# Patient Record
Sex: Male | Born: 1975 | Race: White | Hispanic: No | Smoking: Current every day smoker
Health system: Southern US, Community
[De-identification: ages and names within clinical notes are randomized; demographics above are authoritative.]

## PROBLEM LIST (undated history)

## (undated) DIAGNOSIS — I10 Essential (primary) hypertension: Secondary | ICD-10-CM

## (undated) DIAGNOSIS — F419 Anxiety disorder, unspecified: Secondary | ICD-10-CM

## (undated) DIAGNOSIS — F329 Major depressive disorder, single episode, unspecified: Secondary | ICD-10-CM

## (undated) DIAGNOSIS — F429 Obsessive-compulsive disorder, unspecified: Secondary | ICD-10-CM

## (undated) DIAGNOSIS — F32A Depression, unspecified: Secondary | ICD-10-CM

## (undated) DIAGNOSIS — F1020 Alcohol dependence, uncomplicated: Secondary | ICD-10-CM

## (undated) DIAGNOSIS — G4733 Obstructive sleep apnea (adult) (pediatric): Secondary | ICD-10-CM

## (undated) DIAGNOSIS — E7800 Pure hypercholesterolemia, unspecified: Secondary | ICD-10-CM

## (undated) DIAGNOSIS — K529 Noninfective gastroenteritis and colitis, unspecified: Secondary | ICD-10-CM

## (undated) DIAGNOSIS — F4310 Post-traumatic stress disorder, unspecified: Secondary | ICD-10-CM

## (undated) DIAGNOSIS — I4892 Unspecified atrial flutter: Secondary | ICD-10-CM

## (undated) DIAGNOSIS — I429 Cardiomyopathy, unspecified: Secondary | ICD-10-CM

## (undated) HISTORY — DX: Obstructive sleep apnea (adult) (pediatric): G47.33

## (undated) HISTORY — DX: Unspecified atrial flutter: I48.92

## (undated) HISTORY — DX: Pure hypercholesterolemia, unspecified: E78.00

---

## 2002-10-27 ENCOUNTER — Ambulatory Visit (HOSPITAL_COMMUNITY): Admission: RE | Admit: 2002-10-27 | Discharge: 2002-10-27 | Payer: Self-pay | Admitting: Internal Medicine

## 2002-10-27 ENCOUNTER — Encounter: Payer: Self-pay | Admitting: Internal Medicine

## 2002-10-29 ENCOUNTER — Emergency Department (HOSPITAL_COMMUNITY): Admission: EM | Admit: 2002-10-29 | Discharge: 2002-10-29 | Payer: Self-pay | Admitting: Emergency Medicine

## 2008-03-24 ENCOUNTER — Emergency Department (HOSPITAL_COMMUNITY): Admission: EM | Admit: 2008-03-24 | Discharge: 2008-03-24 | Payer: Self-pay | Admitting: Emergency Medicine

## 2008-03-24 ENCOUNTER — Inpatient Hospital Stay (HOSPITAL_COMMUNITY): Admission: AD | Admit: 2008-03-24 | Discharge: 2008-04-07 | Payer: Self-pay | Admitting: Psychiatry

## 2008-03-24 ENCOUNTER — Ambulatory Visit: Payer: Self-pay | Admitting: Psychiatry

## 2008-04-02 ENCOUNTER — Ambulatory Visit: Payer: Self-pay | Admitting: Psychiatry

## 2008-04-08 ENCOUNTER — Other Ambulatory Visit (HOSPITAL_COMMUNITY): Admission: RE | Admit: 2008-04-08 | Discharge: 2008-04-16 | Payer: Self-pay | Admitting: Psychiatry

## 2008-07-14 ENCOUNTER — Inpatient Hospital Stay (HOSPITAL_COMMUNITY): Admission: RE | Admit: 2008-07-14 | Discharge: 2008-07-27 | Payer: Self-pay | Admitting: Psychiatry

## 2008-07-14 ENCOUNTER — Ambulatory Visit: Payer: Self-pay | Admitting: Psychiatry

## 2008-07-14 ENCOUNTER — Emergency Department (HOSPITAL_COMMUNITY): Admission: EM | Admit: 2008-07-14 | Discharge: 2008-07-14 | Payer: Self-pay | Admitting: Emergency Medicine

## 2008-07-20 ENCOUNTER — Ambulatory Visit (HOSPITAL_COMMUNITY): Admission: RE | Admit: 2008-07-20 | Discharge: 2008-07-20 | Payer: Self-pay | Admitting: Psychiatry

## 2008-10-14 ENCOUNTER — Emergency Department (HOSPITAL_BASED_OUTPATIENT_CLINIC_OR_DEPARTMENT_OTHER): Admission: EM | Admit: 2008-10-14 | Discharge: 2008-10-14 | Payer: Self-pay | Admitting: Emergency Medicine

## 2008-10-15 ENCOUNTER — Inpatient Hospital Stay (HOSPITAL_COMMUNITY): Admission: AD | Admit: 2008-10-15 | Discharge: 2008-10-23 | Payer: Self-pay | Admitting: Psychiatry

## 2008-10-17 ENCOUNTER — Ambulatory Visit: Payer: Self-pay | Admitting: Psychiatry

## 2008-11-12 ENCOUNTER — Emergency Department (HOSPITAL_COMMUNITY): Admission: EM | Admit: 2008-11-12 | Discharge: 2008-11-13 | Payer: Self-pay | Admitting: Emergency Medicine

## 2008-11-13 ENCOUNTER — Inpatient Hospital Stay (HOSPITAL_COMMUNITY): Admission: RE | Admit: 2008-11-13 | Discharge: 2008-11-15 | Payer: Self-pay | Admitting: Psychiatry

## 2009-10-07 ENCOUNTER — Emergency Department (HOSPITAL_COMMUNITY): Admission: EM | Admit: 2009-10-07 | Discharge: 2009-10-07 | Payer: Self-pay | Admitting: Emergency Medicine

## 2010-05-04 LAB — DIFFERENTIAL
Eosinophils Absolute: 0.1 10*3/uL (ref 0.0–0.7)
Eosinophils Relative: 1 % (ref 0–5)
Lymphs Abs: 2 10*3/uL (ref 0.7–4.0)
Monocytes Relative: 6 % (ref 3–12)

## 2010-05-04 LAB — POCT I-STAT, CHEM 8
Calcium, Ion: 1.04 mmol/L — ABNORMAL LOW (ref 1.12–1.32)
Creatinine, Ser: 0.9 mg/dL (ref 0.4–1.5)
Glucose, Bld: 132 mg/dL — ABNORMAL HIGH (ref 70–99)
Hemoglobin: 17.3 g/dL — ABNORMAL HIGH (ref 13.0–17.0)
TCO2: 22 mmol/L (ref 0–100)

## 2010-05-04 LAB — CBC
HCT: 47.7 % (ref 39.0–52.0)
Platelets: 444 10*3/uL — ABNORMAL HIGH (ref 150–400)
RDW: 13.9 % (ref 11.5–15.5)

## 2010-05-04 LAB — ETHANOL: Alcohol, Ethyl (B): 268 mg/dL — ABNORMAL HIGH (ref 0–10)

## 2010-05-04 LAB — RAPID URINE DRUG SCREEN, HOSP PERFORMED
Barbiturates: NOT DETECTED
Opiates: NOT DETECTED

## 2010-05-05 LAB — DIFFERENTIAL
Eosinophils Relative: 4 % (ref 0–5)
Lymphocytes Relative: 39 % (ref 12–46)
Lymphs Abs: 3.4 10*3/uL (ref 0.7–4.0)

## 2010-05-05 LAB — CBC
MCHC: 34 g/dL (ref 30.0–36.0)
MCV: 87 fL (ref 78.0–100.0)
Platelets: 332 10*3/uL (ref 150–400)
RDW: 13.3 % (ref 11.5–15.5)

## 2010-05-05 LAB — COMPREHENSIVE METABOLIC PANEL
AST: 47 U/L — ABNORMAL HIGH (ref 0–37)
CO2: 25 mEq/L (ref 19–32)
Calcium: 9.6 mg/dL (ref 8.4–10.5)
Creatinine, Ser: 0.9 mg/dL (ref 0.4–1.5)
GFR calc Af Amer: >60 mL/min (ref >60)
GFR calc non Af Amer: >60 mL/min (ref >60)

## 2010-05-05 LAB — URINE MICROSCOPIC-ADD ON

## 2010-05-05 LAB — URINALYSIS, ROUTINE W REFLEX MICROSCOPIC
Glucose, UA: NEGATIVE mg/dL
Ketones, ur: NEGATIVE mg/dL
Leukocytes, UA: NEGATIVE
pH: 6 (ref 5.0–8.0)

## 2010-05-05 LAB — ETHANOL: Alcohol, Ethyl (B): 155 mg/dL — ABNORMAL HIGH (ref 0–10)

## 2010-05-05 LAB — POCT TOXICOLOGY PANEL

## 2010-05-08 LAB — DIFFERENTIAL
Basophils Absolute: 0 10*3/uL (ref 0.0–0.1)
Lymphocytes Relative: 28 % (ref 12–46)
Monocytes Absolute: 0.4 10*3/uL (ref 0.1–1.0)
Monocytes Relative: 4 % (ref 3–12)
Neutro Abs: 6.2 10*3/uL (ref 1.7–7.7)
Neutrophils Relative %: 66 % (ref 43–77)

## 2010-05-08 LAB — ETHANOL: Alcohol, Ethyl (B): 202 mg/dL — ABNORMAL HIGH (ref 0–10)

## 2010-05-08 LAB — BASIC METABOLIC PANEL
Calcium: 9.1 mg/dL (ref 8.4–10.5)
GFR calc Af Amer: >60 mL/min (ref >60)
GFR calc non Af Amer: >60 mL/min (ref >60)
Sodium: 138 mEq/L (ref 135–145)

## 2010-05-08 LAB — RAPID URINE DRUG SCREEN, HOSP PERFORMED
Benzodiazepines: NOT DETECTED
Cocaine: NOT DETECTED
Opiates: NOT DETECTED
Tetrahydrocannabinol: NOT DETECTED

## 2010-05-08 LAB — CBC
Hemoglobin: 17.2 g/dL — ABNORMAL HIGH (ref 13.0–17.0)
RBC: 5.64 MIL/uL (ref 4.22–5.81)

## 2010-05-08 LAB — TRICYCLICS SCREEN, URINE: TCA Scrn: 62.6 — AB

## 2010-05-16 LAB — DIFFERENTIAL
Basophils Absolute: 0.1 10*3/uL (ref 0.0–0.1)
Basophils Relative: 1 % (ref 0–1)
Eosinophils Relative: 0 % (ref 0–5)
Monocytes Absolute: 0.4 10*3/uL (ref 0.1–1.0)
Monocytes Relative: 3 % (ref 3–12)

## 2010-05-16 LAB — RAPID URINE DRUG SCREEN, HOSP PERFORMED
Amphetamines: NOT DETECTED
Barbiturates: NOT DETECTED
Opiates: NOT DETECTED

## 2010-05-16 LAB — HEPATIC FUNCTION PANEL
AST: 40 U/L — ABNORMAL HIGH (ref 0–37)
Bilirubin, Direct: 0.2 mg/dL (ref 0.0–0.3)
Indirect Bilirubin: 0.8 mg/dL (ref 0.3–0.9)
Total Bilirubin: 1 mg/dL (ref 0.3–1.2)

## 2010-05-16 LAB — BASIC METABOLIC PANEL
CO2: 25 mEq/L (ref 19–32)
Chloride: 103 mEq/L (ref 96–112)
GFR calc Af Amer: >60 mL/min (ref >60)
Glucose, Bld: 104 mg/dL — ABNORMAL HIGH (ref 70–99)
Potassium: 3.9 mEq/L (ref 3.5–5.1)
Sodium: 142 mEq/L (ref 135–145)

## 2010-05-16 LAB — URINALYSIS, ROUTINE W REFLEX MICROSCOPIC
Bilirubin Urine: NEGATIVE
Hgb urine dipstick: NEGATIVE
Ketones, ur: NEGATIVE mg/dL
Nitrite: NEGATIVE
pH: 5.5 (ref 5.0–8.0)

## 2010-05-16 LAB — URINE MICROSCOPIC-ADD ON

## 2010-05-16 LAB — CBC
HCT: 48.4 % (ref 39.0–52.0)
Hemoglobin: 16.7 g/dL (ref 13.0–17.0)
MCHC: 34.6 g/dL (ref 30.0–36.0)
MCV: 92.2 fL (ref 78.0–100.0)
RBC: 5.25 MIL/uL (ref 4.22–5.81)
RDW: 13.1 % (ref 11.5–15.5)

## 2010-06-13 NOTE — H&P (Signed)
NAME:  KALETH, KOY NO.:  [id number]   MEDICAL RECORD NO.:  [id number]          PATIENT TYPE:  IPS   LOCATION:  0503                          FACILITY:  [facility]   PHYSICIAN:  Geoffery Lyons, M.D.      DATE OF BIRTH:  [birth year]   DATE OF ADMISSION:  03/24/2008  DATE OF DISCHARGE:                       PSYCHIATRIC ADMISSION ASSESSMENT   This is a 35 year old male voluntarily admitted on March 24, 2008.   HISTORY OF PRESENT ILLNESS:  The patient is here to be detoxed off  alcohol.  He states it is killing him.  He has been drinking a lot.  He states it is ruining his  relationship, his occupation and having  some physical problems.  His longest history of sobriety from alcohol  has been 1 week where he states it was rough.  He had trouble sleeping  at that time.  His last drink was the day prior to this admission at 11  a.m.   PAST PSYCHIATRIC HISTORY:  First admission to the [facility].  The patient's psychiatrist is Dr. [name].   SOCIAL HISTORY:  This is a 35 year old male who is single.  He is at  [employer]  No legal issues.   FAMILY HISTORY:  Substance use with the father and grandfather pain.   ALCOHOL AND DRUG HISTORY:  Again, the patient reports drinking to  excess.  Denies any seizures or blackouts.  Denies any recreational drug  use.   PRIMARY CARE PHYSICIAN:  Dr. Merla Riches.   MEDICAL PROBLEMS:  No known acute or chronic medical conditions.   MEDICATIONS:  Recently prescribed Wellbutrin.   DRUG ALLERGIES:  No known allergies.   PHYSICAL EXAMINATION:  This is a young male.  He was fully assessed in  the emergency department.  His physical exam was reviewed with no  significant findings.  He did receive Pepcid.  His temperature is 98.3,  108 heart rate, 20 respirations, blood pressure is 139/96, 213 pounds, 6  feet tall.   LABORATORY DATA:  BMET within normal limits.  Alcohol level was 177.  Urine drug screen was negative.  WBC  count was 12.   MENTAL STATUS EXAMINATION:  He is fully alert, walking in the hallway,  easily approachable and cooperative for interview, casually dressed,  good eye contact.  Speech is clear, normal pace and tone.  The patient's  mood is depressed.  His affect, again, he appears sad, almost became  tearful when talking about losing his relationship because of his  alcohol use.  Thought processes are coherent and goal directed.  No  delusional statements.  His memory appears intact.  Judgment and insight  appears to be good.  He appears sincere.   IMPRESSION:  AXIS I:  Alcohol dependence, depressive disorder NOS.  AXIS II:  Deferred.  AXIS III:  No known medical conditions.  AXIS IV:  Possible problems with primary support group and occupation.  AXIS V:  Current is 40-45.   PLAN:  Out plan is to contract for safety.  We will put patient on the  Librium protocol which was discussed  with the patient.  Will monitor  withdrawal symptoms.  Work on relapse prevention.  The patient will be  in the red group at this time.  Will continue to assess comorbidities in  his support group.  A case manager will assess rehab programs available  to the patient.  His tentative length of stay at this time is 3-4 days.      Landry Corporal, N.P.      Geoffery Lyons, M.D.  Electronically Signed    JO/MEDQ  D:  03/24/2008  T:  03/25/2008  Job:  [id number]

## 2010-06-13 NOTE — H&P (Signed)
NAME:  Malik Edwards, Malik Edwards NO.:  [id number]   MEDICAL RECORD NO.:  [id number]          PATIENT TYPE:  IPS   LOCATION:  0306                          FACILITY:  [facility]   PHYSICIAN:  Geoffery Lyons, M.D.      DATE OF BIRTH:  1975-08-11   DATE OF ADMISSION:  07/14/2008  DATE OF DISCHARGE:                       PSYCHIATRIC ADMISSION ASSESSMENT   HISTORY OF PRESENT ILLNESS:  The patient states that he quit taking his  medication and was remaining sober for approximately 6 months when he  relapsed this past Sunday.  He reports that he was experiencing  flashbacks again and experiencing auditory hallucinations.  States that  he began drinking to counteract the hallucinations and hoping to forget.  He came in to get some help before anything further had happened.   PAST PSYCHIATRIC HISTORY:  The patient has been here prior.  He sees Dr.  Lafayette Dragon with his last visit on April 2010 and has a therapist, [name].   SOCIAL HISTORY:  The patient states he is in a current relationship.  He  states this is a good relationship for him.  His girlfriend is  supportive.  The patient is employed.   FAMILY HISTORY:  None.   INTERIM HISTORY:  Has had some recent drinking, but was sober from  alcohol for 6 months.  His primary care Tyera Hansley is the Dr. Yong Channel at  [facility]   MEDICAL PROBLEMS:  The patient denies any acute or chronic health  issues.   MEDICATIONS:  Has been on most recently on __________  but states that  the medication was never working.  Felt better on medications with his  last admission.  Was unable to recall what those medications are.   DRUG ALLERGIES:  No known allergies.   PHYSICAL EXAMINATION:  GENERAL:  This is a young male who was assessed  at [facility] emergency department.  Physical exam was reviewed with no  significant findings.  He offers no complaints.  Again appears in no  acute distress.   LABORATORY DATA:  Urine drug screen is  negative.  Alcohol level of 202.  BMET is within normal limits.   MENTAL STATUS EXAM:  He is fully alert and cooperative with good eye  contact, casually dressed.  Speech is clear, normal pace and tone.  The  patient's mood is depressed.  He gets teary-eyed at times.  He has a  sense of humor.  Thought processes are coherent, goal directed and does  not appear to be responding to internal stimuli at this time.  Cognitive  function intact.  His memory is good.  Judgment and insight fair.   AXIS I:  Post traumatic stress disorder.  Alcohol abuse.  AXIS II:  Deferred.  AXIS III:  No known medical conditions.  AXIS IV:  Other psychosocial problems.  AXIS V:  Current is 35-40.   PLAN:  Have Librium available on a p.r.n. basis.  Will obtain old  records and review medications.  Will contact girlfriend for a possible  family session.  The patient is to continue with his therapy  and case  manager will assess his follow-up.  His tentative length of stay at this  time is 2-4 days.      Landry Corporal, N.P.      Geoffery Lyons, M.D.  Electronically Signed    JO/MEDQ  D:  07/19/2008  T:  07/19/2008  Job:  [id number]
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
## 2011-01-30 DIAGNOSIS — I4892 Unspecified atrial flutter: Secondary | ICD-10-CM

## 2011-01-30 HISTORY — DX: Unspecified atrial flutter: I48.92

## 2011-10-25 ENCOUNTER — Ambulatory Visit (INDEPENDENT_AMBULATORY_CARE_PROVIDER_SITE_OTHER): Payer: BC Managed Care – PPO | Admitting: Emergency Medicine

## 2011-10-25 VITALS — BP 148/98 | HR 107 | Temp 98.0°F | Resp 18 | Ht 70.0 in | Wt 321.0 lb

## 2011-10-25 DIAGNOSIS — A088 Other specified intestinal infections: Secondary | ICD-10-CM

## 2011-10-25 DIAGNOSIS — J40 Bronchitis, not specified as acute or chronic: Secondary | ICD-10-CM

## 2011-10-25 MED ORDER — LOPERAMIDE HCL 2 MG PO TABS: 2.0000 mg | ORAL_TABLET | Freq: Four times a day (QID) | ORAL | Status: DC | PRN

## 2011-10-25 MED ORDER — AMOXICILLIN-POT CLAVULANATE 875-125 MG PO TABS: 1.0000 | ORAL_TABLET | Freq: Two times a day (BID) | ORAL | Status: DC

## 2011-10-25 MED ORDER — HYDROCOD POLST-CHLORPHEN POLST 10-8 MG/5ML PO LQCR: 5.0000 mL | Freq: Two times a day (BID) | ORAL | Status: DC | PRN

## 2011-10-25 NOTE — Progress Notes (Signed)
[facility] [address] [place] [address] [phone]  Date:  10/25/2011   Name:  Malik Edwards   DOB:  27-Apr-1975   MRN:  [id number]  PCP:  No primary provider on file.    Chief Complaint: URI, Emesis and Diarrhea   History of Present Illness:  Malik Edwards is a 36 y.o. very pleasant male patient who presents with the following:  Ill since three days ago and left work early.  Began with a cough that was productive of a green sputum. Felt like he had a fever.  Tuesday had episode of sever coughing followed by an episode of vomiting.  3x on Tuesday.  Vomiting persisted but has not vomited today.  Tuesday developed diarrhea that is watery.  4-5 loose stools yesterday. No recent antibiotics, no travel or questionable water.  No sick contacts.  Still feels general malaise and myalgias.  Smokes 1/2 PPD.  No wheezing or shortness of breath.  There is no problem list on file for this patient.   No past medical history on file.  No past surgical history on file.  History  Substance Use Topics  . Smoking status: Current Every Day Smoker -- 0.5 packs/day for 15 years    Types: Cigarettes  . Smokeless tobacco: Not on file  . Alcohol Use: Not on file    No family history on file.  No Known Allergies  Medication list has been reviewed and updated.  Current Outpatient Prescriptions on File Prior to Visit  Medication Sig Dispense Refill  . citalopram (CELEXA) 20 MG tablet Take 20 mg by mouth daily.        Review of Systems:  As per HPI, otherwise negative.    Physical Examination: Filed Vitals:   10/25/11 1320  BP: 148/98  Pulse: 107  Temp: 98 F (36.7 C)  Resp: 18   Filed Vitals:   10/25/11 1320  Height: 5\' 10"  (1.778 m)  Weight: 321 lb (145.605 kg)   Body mass index is 46.06 kg/(m^2). Ideal Body Weight: Weight in (lb) to have BMI = 25: 173.9   GEN: WDWN, NAD, Non-toxic, A & O x 3.  Obese.  No rash or sign of sepsis.  Not icteric.   Well hydrated HEENT: Atraumatic, Normocephalic. Neck supple. No masses, No LAD.  Oropharynx negative.   Ears and Nose: No external deformity.  TM negative CV: RRR, No M/G/R. No JVD. No thrill. No extra heart sounds. PULM: CTA B, no wheezes, crackles, rhonchi. No retractions. No resp. distress. No accessory muscle use. ABD: S, NT, ND, +BS. No rebound. No HSM. EXTR: No c/c/e NEURO Normal gait.  PSYCH: Normally interactive. Conversant. Not depressed or anxious appearing.  Calm demeanor. Marland Kitchenl  Assessment and Plan: 1. Bronchitis, not specified as acute or chronic   2. Intestinal infection due to other organism, not elsewhere classified    augmentin tussionex Imodium Follow up as needed   Carmelina Dane, MD  I have reviewed and agree with documentation. Robert P. Merla Riches, M.D.

## 2012-09-12 ENCOUNTER — Inpatient Hospital Stay (HOSPITAL_BASED_OUTPATIENT_CLINIC_OR_DEPARTMENT_OTHER)
Admission: EM | Admit: 2012-09-12 | Discharge: 2012-09-15 | DRG: 138 | Disposition: A | Discharge status: Home / Self Care | Payer: BC Managed Care – PPO | Attending: Cardiovascular Disease | Admitting: Cardiovascular Disease

## 2012-09-12 ENCOUNTER — Encounter (HOSPITAL_BASED_OUTPATIENT_CLINIC_OR_DEPARTMENT_OTHER): Payer: Self-pay

## 2012-09-12 ENCOUNTER — Emergency Department (HOSPITAL_BASED_OUTPATIENT_CLINIC_OR_DEPARTMENT_OTHER): Payer: BC Managed Care – PPO

## 2012-09-12 DIAGNOSIS — Z716 Tobacco abuse counseling: Secondary | ICD-10-CM

## 2012-09-12 DIAGNOSIS — R7401 Elevation of levels of liver transaminase levels: Secondary | ICD-10-CM | POA: Diagnosis present

## 2012-09-12 DIAGNOSIS — Z85828 Personal history of other malignant neoplasm of skin: Secondary | ICD-10-CM

## 2012-09-12 DIAGNOSIS — F259 Schizoaffective disorder, unspecified: Secondary | ICD-10-CM | POA: Diagnosis present

## 2012-09-12 DIAGNOSIS — K5289 Other specified noninfective gastroenteritis and colitis: Secondary | ICD-10-CM | POA: Diagnosis present

## 2012-09-12 DIAGNOSIS — I440 Atrioventricular block, first degree: Secondary | ICD-10-CM | POA: Diagnosis present

## 2012-09-12 DIAGNOSIS — I4892 Unspecified atrial flutter: Principal | ICD-10-CM | POA: Diagnosis present

## 2012-09-12 DIAGNOSIS — G4733 Obstructive sleep apnea (adult) (pediatric): Secondary | ICD-10-CM | POA: Diagnosis present

## 2012-09-12 DIAGNOSIS — I4891 Unspecified atrial fibrillation: Secondary | ICD-10-CM | POA: Diagnosis present

## 2012-09-12 DIAGNOSIS — Z6841 Body Mass Index (BMI) 40.0 and over, adult: Secondary | ICD-10-CM

## 2012-09-12 DIAGNOSIS — F17200 Nicotine dependence, unspecified, uncomplicated: Secondary | ICD-10-CM | POA: Diagnosis present

## 2012-09-12 DIAGNOSIS — I119 Hypertensive heart disease without heart failure: Secondary | ICD-10-CM | POA: Diagnosis present

## 2012-09-12 DIAGNOSIS — I429 Cardiomyopathy, unspecified: Secondary | ICD-10-CM | POA: Diagnosis present

## 2012-09-12 DIAGNOSIS — K7689 Other specified diseases of liver: Secondary | ICD-10-CM | POA: Diagnosis present

## 2012-09-12 DIAGNOSIS — I43 Cardiomyopathy in diseases classified elsewhere: Secondary | ICD-10-CM | POA: Diagnosis present

## 2012-09-12 DIAGNOSIS — I10 Essential (primary) hypertension: Secondary | ICD-10-CM | POA: Diagnosis present

## 2012-09-12 HISTORY — DX: Cardiomyopathy, unspecified: I42.9

## 2012-09-12 HISTORY — DX: Major depressive disorder, single episode, unspecified: F32.9

## 2012-09-12 HISTORY — DX: Noninfective gastroenteritis and colitis, unspecified: K52.9

## 2012-09-12 HISTORY — DX: Essential (primary) hypertension: I10

## 2012-09-12 HISTORY — DX: Depression, unspecified: F32.A

## 2012-09-12 LAB — COMPREHENSIVE METABOLIC PANEL
AST: 57 U/L — ABNORMAL HIGH (ref 0–37)
Albumin: 3.8 g/dL (ref 3.5–5.2)
Alkaline Phosphatase: 111 U/L (ref 39–117)
BUN: 16 mg/dL (ref 6–23)
CO2: 23 mEq/L (ref 19–32)
Chloride: 103 mEq/L (ref 96–112)
GFR calc non Af Amer: >90 mL/min (ref >90)
Potassium: 4.7 mEq/L (ref 3.5–5.1)
Total Bilirubin: 0.2 mg/dL — ABNORMAL LOW (ref 0.3–1.2)

## 2012-09-12 LAB — CBC WITH DIFFERENTIAL/PLATELET
Basophils Absolute: 0.1 10*3/uL (ref 0.0–0.1)
Basophils Relative: 1 % (ref 0–1)
HCT: 43.8 % (ref 39.0–52.0)
Hemoglobin: 14.9 g/dL (ref 13.0–17.0)
Lymphocytes Relative: 18 % (ref 12–46)
MCHC: 34 g/dL (ref 30.0–36.0)
Monocytes Relative: 13 % — ABNORMAL HIGH (ref 3–12)
Neutro Abs: 8.7 10*3/uL — ABNORMAL HIGH (ref 1.7–7.7)
Neutrophils Relative %: 66 % (ref 43–77)
WBC: 13.1 10*3/uL — ABNORMAL HIGH (ref 4.0–10.5)

## 2012-09-12 LAB — PROTIME-INR
INR: 1.09 (ref 0.00–1.49)
Prothrombin Time: 13.9 seconds (ref 11.6–15.2)

## 2012-09-12 MED ORDER — DILTIAZEM HCL 25 MG/5ML IV SOLN
INTRAVENOUS | Status: AC
  Filled 2012-09-12: qty 5

## 2012-09-12 MED ORDER — LISINOPRIL 10 MG PO TABS
10.0000 mg | ORAL_TABLET | Freq: Every day | ORAL | Status: DC
  Administered 2012-09-13 – 2012-09-15 (×3): 10 mg via ORAL
  Filled 2012-09-12 (×3): qty 1

## 2012-09-12 MED ORDER — POTASSIUM CHLORIDE CRYS ER 20 MEQ PO TBCR: 20.0000 meq | EXTENDED_RELEASE_TABLET | Freq: Every day | ORAL | Status: DC

## 2012-09-12 MED ORDER — HEPARIN (PORCINE) IN NACL 100-0.45 UNIT/ML-% IJ SOLN
1750.0000 [IU]/h | INTRAMUSCULAR | Status: AC
  Administered 2012-09-13: 1400 [IU]/h via INTRAVENOUS
  Filled 2012-09-12 (×2): qty 250

## 2012-09-12 MED ORDER — LOPERAMIDE HCL 2 MG PO CAPS
2.0000 mg | ORAL_CAPSULE | Freq: Two times a day (BID) | ORAL | Status: DC
  Filled 2012-09-12: qty 1

## 2012-09-12 MED ORDER — MESALAMINE 1.2 G PO TBEC
1200.0000 mg | DELAYED_RELEASE_TABLET | Freq: Two times a day (BID) | ORAL | Status: DC
  Administered 2012-09-13 – 2012-09-15 (×6): 1.2 g via ORAL
  Filled 2012-09-12 (×7): qty 1

## 2012-09-12 MED ORDER — DILTIAZEM HCL 100 MG IV SOLR
INTRAVENOUS | Status: AC
  Filled 2012-09-12: qty 100

## 2012-09-12 MED ORDER — DEXTROSE 5 % IV SOLN
5.0000 mg/h | INTRAVENOUS | Status: AC
  Administered 2012-09-13 (×2): 15 mg/h via INTRAVENOUS
  Filled 2012-09-12 (×3): qty 100

## 2012-09-12 MED ORDER — CARVEDILOL 25 MG PO TABS
25.0000 mg | ORAL_TABLET | Freq: Two times a day (BID) | ORAL | Status: DC
  Administered 2012-09-13 – 2012-09-15 (×5): 25 mg via ORAL
  Filled 2012-09-12 (×8): qty 1

## 2012-09-12 MED ORDER — FUROSEMIDE 40 MG PO TABS
40.0000 mg | ORAL_TABLET | Freq: Every day | ORAL | Status: DC
  Administered 2012-09-13 – 2012-09-15 (×3): 40 mg via ORAL
  Filled 2012-09-12 (×3): qty 1

## 2012-09-12 MED ORDER — ACETAMINOPHEN 325 MG PO TABS: 650.0000 mg | ORAL_TABLET | ORAL | Status: DC | PRN

## 2012-09-12 MED ORDER — RISPERIDONE 1 MG PO TABS
1.0000 mg | ORAL_TABLET | Freq: Every morning | ORAL | Status: DC
  Administered 2012-09-13 – 2012-09-15 (×3): 1 mg via ORAL
  Filled 2012-09-12 (×3): qty 1

## 2012-09-12 MED ORDER — ONDANSETRON HCL 4 MG/2ML IJ SOLN: 4.0000 mg | Freq: Four times a day (QID) | INTRAMUSCULAR | Status: DC | PRN

## 2012-09-12 MED ORDER — ADENOSINE 6 MG/2ML IV SOLN
INTRAVENOUS | Status: AC
  Administered 2012-09-12: 6 mg
  Filled 2012-09-12: qty 6

## 2012-09-12 MED ORDER — SERTRALINE HCL 50 MG PO TABS
150.0000 mg | ORAL_TABLET | Freq: Every day | ORAL | Status: DC
  Administered 2012-09-13 – 2012-09-14 (×3): 150 mg via ORAL
  Filled 2012-09-12 (×4): qty 1

## 2012-09-12 MED ORDER — DILTIAZEM HCL 100 MG IV SOLR
5.0000 mg/h | INTRAVENOUS | Status: DC
  Administered 2012-09-12: 5 mg/h via INTRAVENOUS

## 2012-09-12 MED ORDER — RISPERIDONE 2 MG PO TABS
2.0000 mg | ORAL_TABLET | Freq: Every day | ORAL | Status: DC
  Administered 2012-09-13 – 2012-09-14 (×3): 2 mg via ORAL
  Filled 2012-09-12 (×4): qty 1

## 2012-09-12 MED ORDER — VITAMIN D3 25 MCG (1000 UNIT) PO TABS
1000.0000 [IU] | ORAL_TABLET | Freq: Every day | ORAL | Status: DC
  Administered 2012-09-13 – 2012-09-15 (×3): 1000 [IU] via ORAL
  Filled 2012-09-12 (×3): qty 1

## 2012-09-12 MED ORDER — HEPARIN BOLUS VIA INFUSION
5000.0000 [IU] | Freq: Once | INTRAVENOUS | Status: AC
  Administered 2012-09-13: 5000 [IU] via INTRAVENOUS
  Filled 2012-09-12: qty 5000

## 2012-09-12 MED ORDER — ADENOSINE 6 MG/2ML IV SOLN
12.0000 mg | Freq: Once | INTRAVENOUS | Status: AC
  Administered 2012-09-12: 12 mg via INTRAVENOUS

## 2012-09-12 NOTE — ED Notes (Signed)
Cardizem 10 mg given at 1645

## 2012-09-12 NOTE — ED Provider Notes (Signed)
CSN: [id number]     Arrival date & time 09/12/12  1559 History     First MD Initiated Contact with Patient 09/12/12 1613     Chief Complaint  Patient presents with  . Palpitations   (Consider location/radiation/quality/duration/timing/severity/associated sxs/prior Treatment) HPI Comments: Patient presents with palpitations. He states that he started having some heart palpitations about 10:30 this morning. This occurred after drinking 2 large tear overlying. He denies any history of SVT or irregular heartbeat in the past. He does have a history of hypertensive cardiomyopathy with an ejection fraction of 35-40%. He sees back in the cardiology and went over there today with the palpitations. They were not comfortable concluding him in the office with a dentist in due his underlying heart disease and sent over here. He denies any chest tightness or shortness of breath. He denies any recent illness. He denies any cough or chest congestion. He denies any leg pain or swelling.  Patient is a 37 y.o. male presenting with palpitations.  Palpitations Associated symptoms: no back pain, no chest pain, no cough, no diaphoresis, no dizziness, no nausea, no numbness, no shortness of breath and no vomiting     Past Medical History  Diagnosis Date  . Hypertension   . Cardiomyopathy   . Colitis    History reviewed. No pertinent past surgical history. No family history on file. History  Substance Use Topics  . Smoking status: Current Every Day Smoker -- 0.50 packs/day for 15 years    Types: Cigarettes  . Smokeless tobacco: Not on file  . Alcohol Use: Not on file    Review of Systems  Constitutional: Negative for fever, chills, diaphoresis and fatigue.  HENT: Negative for congestion, rhinorrhea and sneezing.   Eyes: Negative.   Respiratory: Negative for cough, chest tightness and shortness of breath.   Cardiovascular: Positive for palpitations. Negative for chest pain and leg swelling.   Gastrointestinal: Negative for nausea, vomiting, abdominal pain, diarrhea and blood in stool.  Genitourinary: Negative for frequency, hematuria, flank pain and difficulty urinating.  Musculoskeletal: Negative for back pain and arthralgias.  Skin: Negative for rash.  Neurological: Negative for dizziness, speech difficulty, weakness, numbness and headaches.    Allergies  Review of patient's allergies indicates no known allergies.  Home Medications   Current Outpatient Rx  Name  Route  Sig  Dispense  Refill  . carvedilol (COREG) 25 MG tablet   Oral   Take 25 mg by mouth 2 (two) times daily with a meal.         . furosemide (LASIX) 40 MG tablet   Oral   Take 40 mg by mouth.         Marland Kitchen lisinopril (PRINIVIL,ZESTRIL) 20 MG tablet   Oral   Take 20 mg by mouth daily.         . mesalamine (LIALDA) 1.2 G EC tablet   Oral   Take 1,200 mg by mouth daily with breakfast.         . potassium chloride 20 MEQ/15ML (10%) solution   Oral   Take 40 mEq by mouth daily.         . sertraline (ZOLOFT) 100 MG tablet   Oral   Take 100 mg by mouth daily.         Marland Kitchen amoxicillin-clavulanate (AUGMENTIN) 875-125 MG per tablet   Oral   Take 1 tablet by mouth 2 (two) times daily.   20 tablet   0   . chlorpheniramine-HYDROcodone (TUSSIONEX PENNKINETIC ER)  10-8 MG/5ML LQCR   Oral   Take 5 mLs by mouth every 12 (twelve) hours as needed (cough).   60 mL   0   . citalopram (CELEXA) 20 MG tablet   Oral   Take 20 mg by mouth daily.         Marland Kitchen loperamide (IMODIUM A-D) 2 MG tablet   Oral   Take 1 tablet (2 mg total) by mouth 4 (four) times daily as needed for diarrhea or loose stools.   30 tablet   0    BP 141/96  Pulse 94  Resp 20  SpO2 100% Physical Exam  Constitutional: He is oriented to person, place, and time. He appears well-developed and well-nourished.  HENT:  Head: Normocephalic and atraumatic.  Eyes: Pupils are equal, round, and reactive to light.  Neck: Normal range  of motion. Neck supple.  Cardiovascular: Regular rhythm and normal heart sounds.  Tachycardia present.   Pulmonary/Chest: Effort normal and breath sounds normal. No respiratory distress. He has no wheezes. He has no rales. He exhibits no tenderness.  Abdominal: Soft. Bowel sounds are normal. There is no tenderness. There is no rebound and no guarding.  Musculoskeletal: Normal range of motion. He exhibits no edema.  Lymphadenopathy:    He has no cervical adenopathy.  Neurological: He is alert and oriented to person, place, and time.  Skin: Skin is warm and dry. No rash noted.  Psychiatric: He has a normal mood and affect.    ED Course   Procedures (including critical care time)  Results for orders placed during the hospital encounter of 09/12/12  CBC WITH DIFFERENTIAL      Result Value Range   WBC 13.1 (*) 4.0 - 10.5 K/uL   RBC 4.59  4.22 - 5.81 MIL/uL   Hemoglobin 14.9  13.0 - 17.0 g/dL   HCT 14.7  82.9 - 56.2 %   MCV 95.4  78.0 - 100.0 fL   MCH 32.5  26.0 - 34.0 pg   MCHC 34.0  30.0 - 36.0 g/dL   RDW 13.0  86.5 - 78.4 %   Platelets 295  150 - 400 K/uL   Neutrophils Relative % 66  43 - 77 %   Neutro Abs 8.7 (*) 1.7 - 7.7 K/uL   Lymphocytes Relative 18  12 - 46 %   Lymphs Abs 2.3  0.7 - 4.0 K/uL   Monocytes Relative 13 (*) 3 - 12 %   Monocytes Absolute 1.7 (*) 0.1 - 1.0 K/uL   Eosinophils Relative 2  0 - 5 %   Eosinophils Absolute 0.3  0.0 - 0.7 K/uL   Basophils Relative 1  0 - 1 %   Basophils Absolute 0.1  0.0 - 0.1 K/uL  COMPREHENSIVE METABOLIC PANEL      Result Value Range   Sodium 138  135 - 145 mEq/L   Potassium 4.7  3.5 - 5.1 mEq/L   Chloride 103  96 - 112 mEq/L   CO2 23  19 - 32 mEq/L   Glucose, Bld 104 (*) 70 - 99 mg/dL   BUN 16  6 - 23 mg/dL   Creatinine, Ser 6.96  0.50 - 1.35 mg/dL   Calcium 29.5  8.4 - 28.4 mg/dL   Total Protein 7.5  6.0 - 8.3 g/dL   Albumin 3.8  3.5 - 5.2 g/dL   AST 57 (*) 0 - 37 U/L   ALT 65 (*) 0 - 53 U/L   Alkaline Phosphatase 111  39  -  117 U/L   Total Bilirubin 0.2 (*) 0.3 - 1.2 mg/dL   GFR calc non Af Amer >90  >90 mL/min   GFR calc Af Amer >90  >90 mL/min  TROPONIN I      Result Value Range   Troponin I <0.30  <0.30 ng/mL  PRO B NATRIURETIC PEPTIDE      Result Value Range   Pro B Natriuretic peptide (BNP) 221.1 (*) 0 - 125 pg/mL   Dg Chest Port 1 View  09/12/2012   *RADIOLOGY REPORT*  Clinical Data: Palpitations  PORTABLE CHEST - 1 VIEW  Comparison: 04/25/2009  Findings: Borderline cardiomegaly noted.  No acute infiltrate or pleural effusion.  No pulmonary edema.  Study is limited by patient's large body habitus.  IMPRESSION: Borderline cardiomegaly.  No active disease.   Original Report Authenticated By: Natasha Mead, M.D.    No results found.   Date: 09/12/2012  Rate: 162  Rhythm: supraventricular tachycardia (SVT)  QRS Axis: right  Intervals: normal  ST/T Wave abnormalities: nonspecific ST/T changes  Conduction Disutrbances:none  Narrative Interpretation:   Old EKG Reviewed: none available   Date: 09/12/2012  Rate: 83  Rhythm: atrial fibrillation  QRS Axis: right  Intervals: normal  ST/T Wave abnormalities: nonspecific ST/T changes  Conduction Disutrbances:none  Narrative Interpretation:   Old EKG Reviewed: none available    No results found. 1. New onset atrial fibrillation     MDM  Patient presented with SVT. He was asymptomatic other than the palpitations. He was initially given adenosine 6 mg IV push. He did not convert with this. He was then given an additional dose of 12 mg. At this point his heart rate slowed and he looked like he was in atrial fibrillation. It subsequently went right back up to her rapid rate in the 160s. He then was given a bolus of Cardizem 20 mg IV. This did slow his heart rate down into the 110-120 range. It was noted that he was in atrial fibrillation. He was started on a Cardizem drip at 5 mg per hour. He did not have any ischemic changes on EKG.  I discussed with case  with Dr. Royann Shivers with [facility] who has accepted the pt for transfer to [facility] for admission/further evaluation of the a-fib.  CRITICAL CARE Performed by: Lajarvis Italiano Total critical care time: 60 Critical care time was exclusive of separately billable procedures and treating other patients. Critical care was necessary to treat or prevent imminent or life-threatening deterioration. Critical care was time spent personally by me on the following activities: development of treatment plan with patient and/or surrogate as well as nursing, discussions with consultants, evaluation of patient's response to treatment, examination of patient, obtaining history from patient or surrogate, ordering and performing treatments and interventions, ordering and review of laboratory studies, ordering and review of radiographic studies, pulse oximetry and re-evaluation of patient's condition.   Rolan Bucco, MD 09/12/12 [phone]

## 2012-09-12 NOTE — H&P (Addendum)
Admit date: 09/12/2012 Referring Physician Dr. Fredderick Phenix Primary Cardiologist Dr. Hanley Hays with [facility] in [facility] Chief complaint/reason for admission: palpitations with afib with RVR   HPI: This is a 37yo obese WM who presented to [facility] with palpitations. He states that he started having some heart palpitations about 10:30 this morning. This occurred after drinking 2 large Cheerwines with breakfast. He denies any history of SVT or irregular heartbeat in the past. He does have a history of hypertensive cardiomyopathy with an ejection fraction of 35-40%. He went to see his GI MD this am for workup of chronic diarrhea and he noted an elevated HR.  He was sent to his Cardiologist who found him to be in SVT and sent him to [facility].  He was given 6mg  of Adeonsine and 12mg  Adenosine that slowed the rhythm down to be able to determine it was atrial fibrillation/flutter with RVR.  He was bolused with IV Cardizem and started on a gtt and transferred to [facility].  He denies any chest tightness or shortness of breath.  He denies any dizziness, LE edema or syncope.   He denies any recent illness. He denies any cough or chest congestion. He denies any leg pain or swelling.     PMH:    Past Medical History  Diagnosis Date  . Hypertension   . Cardiomyopathy   . Colitis     PSH:   Removal of basal cell ca on nose  ALLERGIES:   Review of patient's allergies indicates no known allergies.  Prior to Admit Meds:   Prescriptions prior to admission  Medication Sig Dispense Refill  . carvedilol (COREG) 25 MG tablet Take 25 mg by mouth 2 (two) times daily with a meal.      . cholecalciferol (VITAMIN D) 1000 UNITS tablet Take 1,000 Units by mouth daily.      . furosemide (LASIX) 40 MG tablet Take 40 mg by mouth.      Marland Kitchen lisinopril (PRINIVIL,ZESTRIL) 10 MG tablet Take 10 mg by mouth daily.      Marland Kitchen loperamide (IMODIUM) 2 MG capsule Take 2 mg by mouth 2 (two) times daily.      .  mesalamine (LIALDA) 1.2 G EC tablet Take 1,200 mg by mouth 2 (two) times daily.      . Multiple Vitamin (MULTIVITAMIN) tablet Take 1 tablet by mouth daily.      . potassium chloride SA (K-DUR,KLOR-CON) 20 MEQ tablet Take 20 mEq by mouth daily.      . risperiDONE (RISPERDAL) 1 MG tablet Take 1-2 mg by mouth 2 (two) times daily. 1mg  in the morning and 2mg  in the evening      . sertraline (ZOLOFT) 100 MG tablet Take 150 mg by mouth at bedtime.       Family HX:   No family history on file. Social HX:    History   Social History  . Marital Status: Single    Spouse Name: N/A    Number of Children: N/A  . Years of Education: N/A   Occupational History  . Not on file.   Social History Main Topics  . Smoking status: Current Every Day Smoker -- 0.50 packs/day for 15 years    Types: Cigarettes  . Smokeless tobacco: Not on file  . Alcohol Use: Not on file  . Drug Use: Not on file  . Sexual Activity: Not on file   Other Topics Concern  . Not on file  Social History Narrative  . No narrative on file     ROS:  All 11 ROS were addressed and are negative except what is stated in the HPI  PHYSICAL EXAM Filed Vitals:   09/12/12 2100  BP: 144/97  Pulse: 95  Resp: 17   General: Well developed, well nourished, in no acute distress Head: Eyes PERRLA, No xanthomas.   Normal cephalic and atramatic  Lungs:   Clear bilaterally to auscultation and percussion. Heart:   Irregularly irregular and tachy S1 S2 Pulses are 2+ & equal.            No carotid bruit. No JVD.  No abdominal bruits. No femoral bruits. Abdomen: Bowel sounds are positive, abdomen soft and non-tender without masses  Extremities:   No clubbing, cyanosis or edema.  DP +1 Neuro: Alert and oriented X 3. Psych:  Good affect, responds appropriately   Labs:   Lab Results  Component Value Date   WBC 13.1* 09/12/2012   HGB 14.9 09/12/2012   HCT 43.8 09/12/2012   MCV 95.4 09/12/2012   PLT 295 09/12/2012    Recent Labs Lab  09/12/12 1620  NA 138  K 4.7  CL 103  CO2 23  BUN 16  CREATININE 0.80  CALCIUM 10.3  PROT 7.5  BILITOT 0.2*  ALKPHOS 111  ALT 65*  AST 57*  GLUCOSE 104*   Lab Results  Component Value Date   TROPONINI <0.30 09/12/2012       Radiology: *RADIOLOGY REPORT*  Clinical Data: Palpitations  PORTABLE CHEST - 1 VIEW  Comparison: 04/25/2009  Findings: Borderline cardiomegaly noted. No acute infiltrate or  pleural effusion. No pulmonary edema. Study is limited by  patient's large body habitus.  IMPRESSION:  Borderline cardiomegaly. No active disease.  Original Report Authenticated By: Natasha Mead, M.D.   EKG:  Atrial fibrillation/flutter with IRBBB  ASSESSMENT:  1.  New onset atrial fibrillation/flutter starting this am at 10am.  No HR controlled on IV Cardiezm gtt at 15mg /hr 2.  DCM EF 35-40% presumed hypertensive 3.  Obesity 4.  Chronic diarrhea 5.  Transaminitis - patient had [place] and CT scan is seeing a GI MD and felt secondary to fatty liver -apparently markedly elevated recently after starting statin and statin was stopped and LFTs are improved  PLAN:   1.  Admit to tele bed 2.  Cycle cardiac enzymes 3.  IV Cardizem gtt for rate control 4.  IV Heparin gtt  5.  CHeck 2D echo in am 6.  Further workup per Dr. Burnell Blanks, MD  09/12/2012  9:50 PM

## 2012-09-12 NOTE — ED Notes (Signed)
Pt. cardizem gtt up to 12mg /hr due to HR up to 170

## 2012-09-12 NOTE — ED Notes (Signed)
Patient sent by cardiologist for rapid HR. Denies chestpain but feels the palpitations. Denies shortness of breath. Has been diagnosed with cardiomyopathy and EF 35%.

## 2012-09-12 NOTE — Progress Notes (Signed)
ANTICOAGULATION CONSULT NOTE - Initial Consult  Pharmacy Consult for heparin Indication: atrial fibrillation  No Known Allergies  Patient Measurements: Height: 5\' 9"  (175.3 cm) Weight: 311 lb 8.2 oz (141.3 kg) IBW/kg (Calculated) : 70.7 Heparin Dosing Weight: 104kg  Vital Signs: BP: 144/97 mmHg (08/15 2100) Pulse Rate: 95 (08/15 2100)  Labs:  Recent Labs  09/12/12 1620  HGB 14.9  HCT 43.8  PLT 295  CREATININE 0.80  TROPONINI <0.30    Estimated Creatinine Clearance: 175.1 ml/min (by C-G formula based on Cr of 0.8).   Medical History: Past Medical History  Diagnosis Date  . Hypertension   . Cardiomyopathy   . Colitis     Medications:  Prescriptions prior to admission  Medication Sig Dispense Refill  . carvedilol (COREG) 25 MG tablet Take 25 mg by mouth 2 (two) times daily with a meal.      . cholecalciferol (VITAMIN D) 1000 UNITS tablet Take 1,000 Units by mouth daily.      . furosemide (LASIX) 40 MG tablet Take 40 mg by mouth.      Marland Kitchen lisinopril (PRINIVIL,ZESTRIL) 10 MG tablet Take 10 mg by mouth daily.      Marland Kitchen loperamide (IMODIUM) 2 MG capsule Take 2 mg by mouth 2 (two) times daily.      . mesalamine (LIALDA) 1.2 G EC tablet Take 1,200 mg by mouth 2 (two) times daily.      . Multiple Vitamin (MULTIVITAMIN) tablet Take 1 tablet by mouth daily.      . potassium chloride SA (K-DUR,KLOR-CON) 20 MEQ tablet Take 20 mEq by mouth daily.      . risperiDONE (RISPERDAL) 1 MG tablet Take 1-2 mg by mouth 2 (two) times daily. 1mg  in the morning and 2mg  in the evening      . sertraline (ZOLOFT) 100 MG tablet Take 150 mg by mouth at bedtime.       Scheduled:  . [START ON 09/13/2012] carvedilol  25 mg Oral BID WC  . [START ON 09/13/2012] cholecalciferol  1,000 Units Oral Daily  . diltiazem      . diltiazem      . [START ON 09/13/2012] furosemide  40 mg Oral Daily  . [START ON 09/13/2012] lisinopril  10 mg Oral Daily  . loperamide  2 mg Oral BID  . mesalamine  1,200 mg Oral BID   . [START ON 09/13/2012] potassium chloride SA  20 mEq Oral Daily  . risperiDONE  1-2 mg Oral BID  . sertraline  150 mg Oral QHS    Assessment: 37 yo male here with new onset afib to start heparin.  Goal of Therapy:  Heparin level 0.3-0.7 units/ml Monitor platelets by anticoagulation protocol: Yes   Plan:  -Heparin bolus 5000 units IV followed by 1400 units/hr (~14 units/kg/hr) -Heparin level in 6 hours and daily with CBC daily  Harland German, Pharm D 09/12/2012 10:42 PM

## 2012-09-12 NOTE — ED Notes (Signed)
cardizem increased to 7.5 mg/hr. BP stable, pt remains comfortable. No complaints. Remains in AFIB

## 2012-09-13 ENCOUNTER — Encounter (HOSPITAL_COMMUNITY): Payer: Self-pay | Admitting: *Deleted

## 2012-09-13 DIAGNOSIS — I10 Essential (primary) hypertension: Secondary | ICD-10-CM

## 2012-09-13 DIAGNOSIS — Z6841 Body Mass Index (BMI) 40.0 and over, adult: Secondary | ICD-10-CM

## 2012-09-13 DIAGNOSIS — I4892 Unspecified atrial flutter: Secondary | ICD-10-CM | POA: Diagnosis present

## 2012-09-13 DIAGNOSIS — I428 Other cardiomyopathies: Secondary | ICD-10-CM

## 2012-09-13 DIAGNOSIS — I429 Cardiomyopathy, unspecified: Secondary | ICD-10-CM | POA: Diagnosis present

## 2012-09-13 DIAGNOSIS — R7401 Elevation of levels of liver transaminase levels: Secondary | ICD-10-CM | POA: Diagnosis present

## 2012-09-13 DIAGNOSIS — I4891 Unspecified atrial fibrillation: Secondary | ICD-10-CM

## 2012-09-13 LAB — CBC WITH DIFFERENTIAL/PLATELET
Eosinophils Absolute: 0.4 10*3/uL (ref 0.0–0.7)
Eosinophils Relative: 4 % (ref 0–5)
Hemoglobin: 14.8 g/dL (ref 13.0–17.0)
Lymphocytes Relative: 22 % (ref 12–46)
Lymphs Abs: 2.5 10*3/uL (ref 0.7–4.0)
MCH: 33.1 pg (ref 26.0–34.0)
MCV: 94.2 fL (ref 78.0–100.0)
Monocytes Relative: 10 % (ref 3–12)
Neutrophils Relative %: 64 % (ref 43–77)
Platelets: 277 10*3/uL (ref 150–400)
RBC: 4.47 MIL/uL (ref 4.22–5.81)
WBC: 11 10*3/uL — ABNORMAL HIGH (ref 4.0–10.5)

## 2012-09-13 LAB — COMPREHENSIVE METABOLIC PANEL
ALT: 51 U/L (ref 0–53)
AST: 38 U/L — ABNORMAL HIGH (ref 0–37)
CO2: 23 mEq/L (ref 19–32)
Calcium: 8.8 mg/dL (ref 8.4–10.5)
Chloride: 104 mEq/L (ref 96–112)
GFR calc Af Amer: >90 mL/min (ref >90)
GFR calc non Af Amer: >90 mL/min (ref >90)
Glucose, Bld: 112 mg/dL — ABNORMAL HIGH (ref 70–99)
Sodium: 138 mEq/L (ref 135–145)
Total Bilirubin: 0.2 mg/dL — ABNORMAL LOW (ref 0.3–1.2)

## 2012-09-13 LAB — BASIC METABOLIC PANEL
BUN: 12 mg/dL (ref 6–23)
CO2: 24 mEq/L (ref 19–32)
Calcium: 9.3 mg/dL (ref 8.4–10.5)
Chloride: 103 mEq/L (ref 96–112)
Creatinine, Ser: 0.74 mg/dL (ref 0.50–1.35)
GFR calc Af Amer: >90 mL/min (ref >90)

## 2012-09-13 LAB — TROPONIN I
Troponin I: <0.3 ng/mL (ref <0.30)
Troponin I: <0.3 ng/mL (ref <0.30)

## 2012-09-13 LAB — CBC
HCT: 43.8 % (ref 39.0–52.0)
MCH: 32.2 pg (ref 26.0–34.0)
MCV: 94.6 fL (ref 78.0–100.0)
RDW: 14.1 % (ref 11.5–15.5)
WBC: 9.6 10*3/uL (ref 4.0–10.5)

## 2012-09-13 LAB — TSH: TSH: 5.136 u[IU]/mL — ABNORMAL HIGH (ref 0.350–4.500)

## 2012-09-13 MED ORDER — POTASSIUM CHLORIDE CRYS ER 10 MEQ PO TBCR
10.0000 meq | EXTENDED_RELEASE_TABLET | Freq: Every day | ORAL | Status: DC
  Administered 2012-09-13 – 2012-09-15 (×3): 10 meq via ORAL
  Filled 2012-09-13 (×3): qty 1

## 2012-09-13 MED ORDER — DILTIAZEM HCL ER COATED BEADS 360 MG PO CP24
360.0000 mg | ORAL_CAPSULE | Freq: Every day | ORAL | Status: DC
  Administered 2012-09-13 – 2012-09-15 (×3): 360 mg via ORAL
  Filled 2012-09-13 (×3): qty 1

## 2012-09-13 MED ORDER — APIXABAN 5 MG PO TABS
5.0000 mg | ORAL_TABLET | Freq: Two times a day (BID) | ORAL | Status: DC
  Administered 2012-09-13 – 2012-09-15 (×5): 5 mg via ORAL
  Filled 2012-09-13 (×6): qty 1

## 2012-09-13 MED ORDER — LOPERAMIDE HCL 2 MG PO CAPS
2.0000 mg | ORAL_CAPSULE | Freq: Two times a day (BID) | ORAL | Status: DC
  Administered 2012-09-13 – 2012-09-15 (×6): 2 mg via ORAL
  Filled 2012-09-13 (×7): qty 1

## 2012-09-13 MED ORDER — HEPARIN BOLUS VIA INFUSION
3000.0000 [IU] | Freq: Once | INTRAVENOUS | Status: AC
  Administered 2012-09-13: 3000 [IU] via INTRAVENOUS
  Filled 2012-09-13: qty 3000

## 2012-09-13 NOTE — Progress Notes (Signed)
  Echocardiogram 2D Echocardiogram has been performed.  Malik Edwards 09/13/2012, 9:30 AM

## 2012-09-13 NOTE — Progress Notes (Signed)
[facility]  DAILY PROGRESS NOTE   Subjective:  Feels well at rest, tachycardic/palps when he stands up 4-5 stools of (chronic) diarrhea daily due to nonspecific colitis. No dyspnea or angina at rest. Reports recent negative nuclear myocardial scan at [facility] No hx of TIA or CVA No bleeding history, but works as Psychiatrist, concerned about injuries ROS strongly suggestive of OSA (as is neck circumference and body habitus)  Objective:  Temp:  [97.7 F (36.5 C)-98.6 F (37 C)] 97.7 F (36.5 C) (08/16 0421) Pulse Rate:  [61-162] 85 (08/16 0900) Resp:  [13-25] 19 (08/16 0900) BP: (121-173)/(65-114) 140/89 mmHg (08/16 0900) SpO2:  [94 %-100 %] 97 % (08/16 0900) Weight:  [311 lb 8.2 oz (141.3 kg)] 311 lb 8.2 oz (141.3 kg) (08/15 2023) Weight change:   Intake/Output from previous day: 08/15 0701 - 08/16 0700 In: 659.2 [P.O.:360; I.V.:299.2] Out: 850 [Urine:850]  Intake/Output from this shift: Total I/O In: 16.1 [I.V.:16.1] Out: -   Medications: Current Facility-Administered Medications  Medication Dose Route Frequency Provider Last Rate Last Dose  . acetaminophen (TYLENOL) tablet 650 mg  650 mg Oral Q4H PRN Quintella Reichert, MD      . carvedilol (COREG) tablet 25 mg  25 mg Oral BID WC Quintella Reichert, MD   25 mg at 09/13/12 0910  . cholecalciferol (VITAMIN D) tablet 1,000 Units  1,000 Units Oral Daily Quintella Reichert, MD      . diltiazem (CARDIZEM) 100 mg in dextrose 5 % 100 mL infusion  5-15 mg/hr Intravenous Titrated Quintella Reichert, MD 15 mL/hr at 09/13/12 0900 15 mg/hr at 09/13/12 0900  . furosemide (LASIX) tablet 40 mg  40 mg Oral Daily Quintella Reichert, MD      . heparin ADULT infusion 100 units/mL (25000 units/250 mL)  1,750 Units/hr Intravenous Continuous Maryanna Shape [place], RPH 17.5 mL/hr at 09/13/12 0909 1,750 Units/hr at 09/13/12 0909  . lisinopril (PRINIVIL,ZESTRIL) tablet 10 mg  10 mg Oral Daily Quintella Reichert, MD      .  loperamide (IMODIUM) capsule 2 mg  2 mg Oral BID Thurmon Fair, MD   2 mg at 09/13/12 0030  . mesalamine (LIALDA) EC tablet 1.2 g  1,200 mg Oral BID Quintella Reichert, MD   1.2 g at 09/13/12 0008  . ondansetron (ZOFRAN) injection 4 mg  4 mg Intravenous Q6H PRN Quintella Reichert, MD      . potassium chloride (K-DUR,KLOR-CON) CR tablet 10 mEq  10 mEq Oral Daily Jelesa Mangini, MD      . risperiDONE (RISPERDAL) tablet 1 mg  1 mg Oral q morning - 10a Traci R Turner, MD      . risperiDONE (RISPERDAL) tablet 2 mg  2 mg Oral QHS Rhen Dossantos, MD   2 mg at 09/13/12 0008  . sertraline (ZOLOFT) tablet 150 mg  150 mg Oral QHS Quintella Reichert, MD   150 mg at 09/13/12 0008    Physical Exam: General appearance: alert, cooperative and morbidly obese Neck: no adenopathy, no carotid bruit, no JVD, supple, symmetrical, trachea midline and thyroid not enlarged, symmetric, no tenderness/mass/nodules Lungs: clear to auscultation bilaterally Heart: regular rate and rhythm, S1, S2 normal, no murmur, click, rub or gallop Abdomen: soft, non-tender; bowel sounds normal; no masses,  no organomegaly Extremities: extremities normal, atraumatic, no cyanosis or edema Pulses: 2+ and symmetric Skin: Skin color, texture, turgor normal. No rashes or lesions Neurologic: Alert and oriented X 3, normal  strength and tone. Normal symmetric reflexes. Normal coordination and gait  Lab Results: Results for orders placed during the hospital encounter of 09/12/12 (from the past 48 hour(s))  CBC WITH DIFFERENTIAL     Status: Abnormal   Collection Time    09/12/12  4:20 PM      Result Value Range   WBC 13.1 (*) 4.0 - 10.5 K/uL   RBC 4.59  4.22 - 5.81 MIL/uL   Hemoglobin 14.9  13.0 - 17.0 g/dL   HCT 30.8  65.7 - 84.6 %   MCV 95.4  78.0 - 100.0 fL   MCH 32.5  26.0 - 34.0 pg   MCHC 34.0  30.0 - 36.0 g/dL   RDW 96.2  95.2 - 84.1 %   Platelets 295  150 - 400 K/uL   Neutrophils Relative % 66  43 - 77 %   Neutro Abs 8.7 (*) 1.7 - 7.7  K/uL   Lymphocytes Relative 18  12 - 46 %   Lymphs Abs 2.3  0.7 - 4.0 K/uL   Monocytes Relative 13 (*) 3 - 12 %   Monocytes Absolute 1.7 (*) 0.1 - 1.0 K/uL   Eosinophils Relative 2  0 - 5 %   Eosinophils Absolute 0.3  0.0 - 0.7 K/uL   Basophils Relative 1  0 - 1 %   Basophils Absolute 0.1  0.0 - 0.1 K/uL  COMPREHENSIVE METABOLIC PANEL     Status: Abnormal   Collection Time    09/12/12  4:20 PM      Result Value Range   Sodium 138  135 - 145 mEq/L   Potassium 4.7  3.5 - 5.1 mEq/L   Chloride 103  96 - 112 mEq/L   CO2 23  19 - 32 mEq/L   Glucose, Bld 104 (*) 70 - 99 mg/dL   BUN 16  6 - 23 mg/dL   Creatinine, Ser 3.24  0.50 - 1.35 mg/dL   Calcium 40.1  8.4 - 02.7 mg/dL   Total Protein 7.5  6.0 - 8.3 g/dL   Albumin 3.8  3.5 - 5.2 g/dL   AST 57 (*) 0 - 37 U/L   ALT 65 (*) 0 - 53 U/L   Alkaline Phosphatase 111  39 - 117 U/L   Total Bilirubin 0.2 (*) 0.3 - 1.2 mg/dL   GFR calc non Af Amer >90  >90 mL/min   GFR calc Af Amer >90  >90 mL/min   Comment: (NOTE)     The eGFR has been calculated using the CKD EPI equation.     This calculation has not been validated in all clinical situations.     eGFR's persistently <90 mL/min signify possible Chronic Kidney     Disease.  TROPONIN I     Status: None   Collection Time    09/12/12  4:20 PM      Result Value Range   Troponin I <0.30  <0.30 ng/mL   Comment:            Due to the release kinetics of cTnI,     a negative result within the first hours     of the onset of symptoms does not rule out     myocardial infarction with certainty.     If myocardial infarction is still suspected,     repeat the test at appropriate intervals.  PRO B NATRIURETIC PEPTIDE     Status: Abnormal   Collection Time    09/12/12  4:20  PM      Result Value Range   Pro B Natriuretic peptide (BNP) 221.1 (*) 0 - 125 pg/mL  MRSA PCR SCREENING     Status: None   Collection Time    09/12/12  9:00 PM      Result Value Range   MRSA by PCR NEGATIVE  NEGATIVE    Comment:            The GeneXpert MRSA Assay (FDA     approved for NASAL specimens     only), is one component of a     comprehensive MRSA colonization     surveillance program. It is not     intended to diagnose MRSA     infection nor to guide or     monitor treatment for     MRSA infections.  D-DIMER, QUANTITATIVE     Status: None   Collection Time    09/12/12 10:40 PM      Result Value Range   D-Dimer, Quant 0.48  0.00 - 0.48 ug/mL-FEU   Comment:            AT THE INHOUSE ESTABLISHED CUTOFF     VALUE OF 0.48 ug/mL FEU,     THIS ASSAY HAS BEEN DOCUMENTED     IN THE LITERATURE TO HAVE     A SENSITIVITY AND NEGATIVE     PREDICTIVE VALUE OF AT LEAST     98 TO 99%.  THE TEST RESULT     SHOULD BE CORRELATED WITH     AN ASSESSMENT OF THE CLINICAL     PROBABILITY OF DVT / VTE.  PROTIME-INR     Status: None   Collection Time    09/12/12 10:40 PM      Result Value Range   Prothrombin Time 13.9  11.6 - 15.2 seconds   INR 1.09  0.00 - 1.49  APTT     Status: None   Collection Time    09/12/12 10:40 PM      Result Value Range   aPTT 32  24 - 37 seconds  CBC WITH DIFFERENTIAL     Status: Abnormal   Collection Time    09/13/12 12:22 AM      Result Value Range   WBC 11.0 (*) 4.0 - 10.5 K/uL   RBC 4.47  4.22 - 5.81 MIL/uL   Hemoglobin 14.8  13.0 - 17.0 g/dL   HCT 16.1  09.6 - 04.5 %   MCV 94.2  78.0 - 100.0 fL   MCH 33.1  26.0 - 34.0 pg   MCHC 35.2  30.0 - 36.0 g/dL   RDW 40.9  81.1 - 91.4 %   Platelets 277  150 - 400 K/uL   Neutrophils Relative % 64  43 - 77 %   Neutro Abs 7.0  1.7 - 7.7 K/uL   Lymphocytes Relative 22  12 - 46 %   Lymphs Abs 2.5  0.7 - 4.0 K/uL   Monocytes Relative 10  3 - 12 %   Monocytes Absolute 1.1 (*) 0.1 - 1.0 K/uL   Eosinophils Relative 4  0 - 5 %   Eosinophils Absolute 0.4  0.0 - 0.7 K/uL   Basophils Relative 1  0 - 1 %   Basophils Absolute 0.1  0.0 - 0.1 K/uL  COMPREHENSIVE METABOLIC PANEL     Status: Abnormal   Collection Time    09/13/12  12:22 AM      Result Value Range  Sodium 138  135 - 145 mEq/L   Potassium 3.8  3.5 - 5.1 mEq/L   Chloride 104  96 - 112 mEq/L   CO2 23  19 - 32 mEq/L   Glucose, Bld 112 (*) 70 - 99 mg/dL   BUN 14  6 - 23 mg/dL   Creatinine, Ser 8.29  0.50 - 1.35 mg/dL   Calcium 8.8  8.4 - 56.2 mg/dL   Total Protein 6.6  6.0 - 8.3 g/dL   Albumin 3.2 (*) 3.5 - 5.2 g/dL   AST 38 (*) 0 - 37 U/L   ALT 51  0 - 53 U/L   Alkaline Phosphatase 88  39 - 117 U/L   Total Bilirubin 0.2 (*) 0.3 - 1.2 mg/dL   GFR calc non Af Amer >90  >90 mL/min   GFR calc Af Amer >90  >90 mL/min   Comment: (NOTE)     The eGFR has been calculated using the CKD EPI equation.     This calculation has not been validated in all clinical situations.     eGFR's persistently <90 mL/min signify possible Chronic Kidney     Disease.  MAGNESIUM     Status: None   Collection Time    09/13/12 12:22 AM      Result Value Range   Magnesium 2.1  1.5 - 2.5 mg/dL  TROPONIN I     Status: None   Collection Time    09/13/12 12:22 AM      Result Value Range   Troponin I <0.30  <0.30 ng/mL   Comment:            Due to the release kinetics of cTnI,     a negative result within the first hours     of the onset of symptoms does not rule out     myocardial infarction with certainty.     If myocardial infarction is still suspected,     repeat the test at appropriate intervals.  OCCULT BLOOD X 1 CARD TO LAB, STOOL     Status: None   Collection Time    09/13/12  1:18 AM      Result Value Range   Fecal Occult Bld NEGATIVE  NEGATIVE  CBC     Status: None   Collection Time    09/13/12  6:00 AM      Result Value Range   WBC 9.6  4.0 - 10.5 K/uL   RBC 4.63  4.22 - 5.81 MIL/uL   Hemoglobin 14.9  13.0 - 17.0 g/dL   HCT 13.0  86.5 - 78.4 %   MCV 94.6  78.0 - 100.0 fL   MCH 32.2  26.0 - 34.0 pg   MCHC 34.0  30.0 - 36.0 g/dL   RDW 69.6  29.5 - 28.4 %   Platelets 272  150 - 400 K/uL  BASIC METABOLIC PANEL     Status: Abnormal   Collection Time     09/13/12  6:00 AM      Result Value Range   Sodium 138  135 - 145 mEq/L   Potassium 4.4  3.5 - 5.1 mEq/L   Chloride 103  96 - 112 mEq/L   CO2 24  19 - 32 mEq/L   Glucose, Bld 112 (*) 70 - 99 mg/dL   BUN 12  6 - 23 mg/dL   Creatinine, Ser 1.32  0.50 - 1.35 mg/dL   Calcium 9.3  8.4 - 44.0 mg/dL   GFR calc  non Af Amer >90  >90 mL/min   GFR calc Af Amer >90  >90 mL/min   Comment: (NOTE)     The eGFR has been calculated using the CKD EPI equation.     This calculation has not been validated in all clinical situations.     eGFR's persistently <90 mL/min signify possible Chronic Kidney     Disease.  PROTIME-INR     Status: None   Collection Time    09/13/12  6:00 AM      Result Value Range   Prothrombin Time 13.3  11.6 - 15.2 seconds   INR 1.03  0.00 - 1.49  HEPARIN LEVEL (UNFRACTIONATED)     Status: Abnormal   Collection Time    09/13/12  6:00 AM      Result Value Range   Heparin Unfractionated <0.10 (*) 0.30 - 0.70 IU/mL   Comment:            IF HEPARIN RESULTS ARE BELOW     EXPECTED VALUES, AND PATIENT     DOSAGE HAS BEEN CONFIRMED,     SUGGEST FOLLOW UP TESTING     OF ANTITHROMBIN III LEVELS.     REPEATED TO VERIFY  TROPONIN I     Status: None   Collection Time    09/13/12  6:00 AM      Result Value Range   Troponin I <0.30  <0.30 ng/mL   Comment:            Due to the release kinetics of cTnI,     a negative result within the first hours     of the onset of symptoms does not rule out     myocardial infarction with certainty.     If myocardial infarction is still suspected,     repeat the test at appropriate intervals.    Imaging: Dg Chest Port 1 View  09/12/2012   *RADIOLOGY REPORT*  Clinical Data: Palpitations  PORTABLE CHEST - 1 VIEW  Comparison: 04/25/2009  Findings: Borderline cardiomegaly noted.  No acute infiltrate or pleural effusion.  No pulmonary edema.  Study is limited by patient's large body habitus.  IMPRESSION: Borderline cardiomegaly.  No active  disease.   Original Report Authenticated By: Natasha Mead, M.D.    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 In my opinion all his rhythm strips and the 12 lead ECG show atrial flutter (atrial rate 324 bpm) with 2:1 AV block or higher - episodes of rhythm irregularity are secondary to variable AV block 2. Switch to oral diltiazem, but it is unlikely rate control will work when he is active - will need cardioversion if he does not convert spontaneously in next 24-48 hours. 3. Rhythm onset was abrupt and immediately noticeable and he has not experienced these symptoms ever before; heparin was started roughly 6 hours after rhythm onset. I do not think he needs a TEE prior to electrical cardioversion. 4. In long run, he will be best served by early RF ablation if flutter recurs; antiarrhythmics are a less desirable choice at his age 37. Switch to oral anticoagulants - will be more efficient to start a NOAC right now, but later he may choose to switch to warfarin if the expense is too  great 6. High suspicion for OSA as underlying organic substrate for the arrhythmia - he was already set up to have an "in-home" overnight sleep study/oxymetry 7. Continue ACEi and carvedilol for LF dysfunction/HTNive cardiomyopathy - but he is already on max dose carvedilol, will need calcium channel blocker for rate (despite negative inotrope effect).  Time Spent Directly with Patient:  60 minutes  Length of Stay:  LOS: 1 day    Elany Felix 09/13/2012, 9:26 AM

## 2012-09-13 NOTE — Progress Notes (Signed)
RN called, Re: patient request potential earlier DCCV tomorrow or Sunday due to work issues. Patient stable on medical treatment now with HR 80- 100 bpm. Explained that will try our best but weekend DCCV may not be possible due to staff issues and medically it is better to continue Heparin for longer time prior to DCCV on Monday unless he becomes hemodynamically unstable  Haydee Salter, MD

## 2012-09-13 NOTE — Progress Notes (Signed)
When pt is relaxed and still, his heart rate is well controlled at around 85 bpm on Cardizem drip at 15mg /hr, however, pt's heart rate quickly elevates with any activity.  Even with light activities such as eating or sitting up on the side of the bed, pt's heart rate goes up to 140's - 160's, sustained, the highest seen was 172 and does resolve when pt returns to resting. Pt denies chest pain, but does get mildly short of breath, sats remain stable.  Will continue to monitor.

## 2012-09-13 NOTE — Progress Notes (Signed)
ANTICOAGULATION CONSULT NOTE - Follow Up Consult  Pharmacy Consult for Heparin Indication: atrial fibrillation  No Known Allergies  Patient Measurements: Height: 5\' 9"  (175.3 cm) Weight: 311 lb 8.2 oz (141.3 kg) IBW/kg (Calculated) : 70.7 Heparin Dosing Weight: 104 kg  Vital Signs: Temp: 97.7 F (36.5 C) (08/16 0421) Temp src: Oral (08/16 0421) BP: 130/77 mmHg (08/16 0421) Pulse Rate: 85 (08/16 0421)  Labs:  Recent Labs  09/12/12 1620 09/12/12 2240 09/13/12 0022 09/13/12 0600  HGB 14.9  --  14.8 14.9  HCT 43.8  --  42.1 43.8  PLT 295  --  277 272  APTT  --  32  --   --   LABPROT  --  13.9  --  13.3  INR  --  1.09  --  1.03  HEPARINUNFRC  --   --   --  <0.10*  CREATININE 0.80  --  0.69 0.74  TROPONINI <0.30  --  <0.30 <0.30    Estimated Creatinine Clearance: 175.1 ml/min (by C-G formula based on Cr of 0.74).   Medications:  Infusions:  . diltiazem (CARDIZEM) infusion 15 mg/hr (09/13/12 1610)  . heparin 1,400 Units/hr (09/13/12 0009)    Assessment: 37 y/o male on a heparin drip for new onset Afib. Heparin level is subtherapeutic at <0.1 on 1400 units/hr. No bleeding noted, CBC is stable.  Goal of Therapy:  Heparin level 0.3-0.7 units/ml Monitor platelets by anticoagulation protocol: Yes   Plan:  -Heparin 3000 units IV bolus then increase drip to 1750 units/hr -Heparin level 6 hours after rate change -Daily heparin level and CBC -Monitor for signs/symptoms of bleeding  [facility], [place].D., BCPS Clinical Pharmacist Pager: [phone] 09/13/2012 8:29 AM

## 2012-09-14 DIAGNOSIS — F17200 Nicotine dependence, unspecified, uncomplicated: Secondary | ICD-10-CM

## 2012-09-14 DIAGNOSIS — Z7189 Other specified counseling: Secondary | ICD-10-CM

## 2012-09-14 MED ORDER — NICOTINE 14 MG/24HR TD PT24
14.0000 mg | MEDICATED_PATCH | Freq: Every day | TRANSDERMAL | Status: DC | PRN
  Filled 2012-09-14: qty 1

## 2012-09-14 MED ORDER — CLONAZEPAM 0.5 MG PO TABS
1.0000 mg | ORAL_TABLET | Freq: Four times a day (QID) | ORAL | Status: DC | PRN
  Administered 2012-09-14 (×2): 1 mg via ORAL
  Filled 2012-09-14: qty 1
  Filled 2012-09-14: qty 2
  Filled 2012-09-14: qty 1

## 2012-09-14 NOTE — Progress Notes (Addendum)
Subjective:  No complaints, his HR is controlled at rest, increases with any activity.  Objective:  Vital Signs in the last 24 hours: Temp:  [97.5 F (36.4 C)-98.7 F (37.1 C)] 98.4 F (36.9 C) (08/17 0340) Pulse Rate:  [83-107] 83 (08/17 0340) Resp:  [13-20] 16 (08/17 0004) BP: (118-144)/(71-89) 130/79 mmHg (08/17 0340) SpO2:  [95 %-100 %] 95 % (08/17 0340)  Intake/Output from previous day:  Intake/Output Summary (Last 24 hours) at 09/14/12 0810 Last data filed at 09/13/12 2300  Gross per 24 hour  Intake 935.98 ml  Output    600 ml  Net 335.98 ml    Physical Exam: General appearance: alert, cooperative, no distress and morbidly obese Lungs: few rhonchi Heart: irregularly irregular rhythm   Rate: 85-160  Rhythm: atrial flutter  Lab Results:  Recent Labs  09/13/12 0022 09/13/12 0600  WBC 11.0* 9.6  HGB 14.8 14.9  PLT 277 272    Recent Labs  09/13/12 0022 09/13/12 0600  NA 138 138  K 3.8 4.4  CL 104 103  CO2 23 24  GLUCOSE 112* 112*  BUN 14 12  CREATININE 0.69 0.74    Recent Labs  09/13/12 0600 09/13/12 1210  TROPONINI <0.30 <0.30   Hepatic Function Panel  Recent Labs  09/13/12 0022  PROT 6.6  ALBUMIN 3.2*  AST 38*  ALT 51  ALKPHOS 88  BILITOT 0.2*   No results found for this basename: CHOL,  in the last 72 hours  Recent Labs  09/13/12 0600  INR 1.03    Imaging: Imaging results have been reviewed  Cardiac Studies:  Assessment/Plan:   Principal Problem:   Atrial flutter with rapid ventricular response Active Problems:   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 DCCV today.  Corine Shelter PA-C Beeper [phone] 09/14/2012, 8:10 AM   I have seen and examined the patient along with Corine Shelter, PA.  I have reviewed the chart, notes and new data.  I agree  with PA/NP's note.  Key new complaints: he is a little irate about prolonged hospital stay, but calms down when we discuss  The rationale behind it. He bears a diagnosis of schizoaffective disorder Key examination changes: HR 166 bpm when he walks or is upset, average HR 80-110  PLAN: DC cardioversion with anesthesiology assistance for sedation as soon as we can arrange it tomorrow. Will have received 5 doses of Eliquis by then. Clonazepam has worked for him before. Also offered nicotine substitutes.  Thurmon Fair, MD, [facility] [phone] 09/14/2012, 10:03 AM

## 2012-09-15 ENCOUNTER — Encounter (HOSPITAL_COMMUNITY): Payer: Self-pay | Admitting: Certified Registered"

## 2012-09-15 ENCOUNTER — Encounter (HOSPITAL_COMMUNITY): Payer: Self-pay | Admitting: Anesthesiology

## 2012-09-15 ENCOUNTER — Inpatient Hospital Stay (HOSPITAL_COMMUNITY): Payer: BC Managed Care – PPO | Admitting: Anesthesiology

## 2012-09-15 ENCOUNTER — Encounter (HOSPITAL_COMMUNITY)
Admission: EM | Disposition: A | Discharge status: Home / Self Care | Payer: Self-pay | Source: Home / Self Care | Attending: Cardiovascular Disease

## 2012-09-15 HISTORY — PX: CARDIOVERSION: SHX1299

## 2012-09-15 SURGERY — CARDIOVERSION
Anesthesia: Monitor Anesthesia Care | Wound class: Clean

## 2012-09-15 MED ORDER — PROPOFOL 10 MG/ML IV BOLUS
INTRAVENOUS | Status: DC | PRN
  Administered 2012-09-15: 200 mg via INTRAVENOUS

## 2012-09-15 MED ORDER — DILTIAZEM HCL ER COATED BEADS 360 MG PO CP24: 360.0000 mg | ORAL_CAPSULE | Freq: Every day | ORAL | Status: DC

## 2012-09-15 MED ORDER — APIXABAN 5 MG PO TABS: 5.0000 mg | ORAL_TABLET | Freq: Two times a day (BID) | ORAL | Status: DC

## 2012-09-15 NOTE — Anesthesia Preprocedure Evaluation (Addendum)
Anesthesia Evaluation  Patient identified by MRN, date of birth, ID band Patient awake    Reviewed: Allergy & Precautions, H&P , NPO status , Patient's Chart, lab work & pertinent test results  History of Anesthesia Complications Negative for: history of anesthetic complications  Airway Mallampati: II TM Distance: >3 FB Neck ROM: Full    Dental  (+) Dental Advisory Given   Pulmonary Current Smoker,          Cardiovascular hypertension, Pt. on medications + dysrhythmias Atrial Fibrillation     Neuro/Psych negative neurological ROS     GI/Hepatic negative GI ROS, Neg liver ROS,   Endo/Other  negative endocrine ROS  Renal/GU negative Renal ROS     Musculoskeletal   Abdominal   Peds  Hematology negative hematology ROS (+)   Anesthesia Other Findings   Reproductive/Obstetrics                          Anesthesia Physical Anesthesia Plan  ASA: III  Anesthesia Plan: General   Post-op Pain Management:    Induction: Intravenous  Airway Management Planned: Mask  Additional Equipment:   Intra-op Plan:   Post-operative Plan:   Informed Consent: I have reviewed the patients History and Physical, chart, labs and discussed the procedure including the risks, benefits and alternatives for the proposed anesthesia with the patient or authorized representative who has indicated his/her understanding and acceptance.   Dental advisory given  Plan Discussed with: CRNA and Surgeon  Anesthesia Plan Comments:        Anesthesia Quick Evaluation

## 2012-09-15 NOTE — Anesthesia Postprocedure Evaluation (Signed)
  Anesthesia Post-op Note  Patient: Malik Edwards  Procedure(s) Performed: Procedure(s): CARDIOVERSION (N/A)  Patient Location: PACU and Nursing Unit  Anesthesia Type:MAC  Level of Consciousness: awake, alert  and oriented  Airway and Oxygen Therapy: Patient Spontanous Breathing and Patient connected to nasal cannula oxygen  Post-op Pain: none  Post-op Assessment: Post-op Vital signs reviewed and Patient's Cardiovascular Status Stable  Post-op Vital Signs: Reviewed  Complications: No apparent anesthesia complications

## 2012-09-15 NOTE — Transfer of Care (Signed)
Immediate Anesthesia Transfer of Care Note  Patient: Malik Edwards  Procedure(s) Performed: Procedure(s): CARDIOVERSION (N/A)  Patient Location: 2618  Anesthesia Type:MAC  Level of Consciousness: awake, alert  and oriented  Airway & Oxygen Therapy: Patient Spontanous Breathing and Patient connected to nasal cannula oxygen  Post-op Assessment: Report given to PACU RN and Post -op Vital signs reviewed and stable  Post vital signs: Reviewed and stable  Complications: No apparent anesthesia complications

## 2012-09-15 NOTE — CV Procedure (Signed)
  CARDIOVERSION NOTE  Procedure: Electrical Cardioversion Indications:  Atrial Flutter  Procedure Details:  Consent: Risks of procedure as well as the alternatives and risks of each were explained to the (patient/caregiver).  Consent for procedure obtained.  Time Out: Verified patient identification, verified procedure, site/side was marked, verified correct patient position, special equipment/implants available, medications/allergies/relevent history reviewed, required imaging and test results available.  Performed  Patient placed on cardiac monitor, pulse oximetry, supplemental oxygen as necessary.  Sedation given: 200 mg Propofol per anesthesia Pacer pads placed anterior and posterior chest.  Cardioverted 1 time(s).  Cardioverted at 150J biphasic.  Impression: Findings: Post procedure EKG shows: NSR Complications: None Patient did tolerate procedure well.  Plan: 1.  Ok for discharge home today on Eliquis and current rate control medications. 2.  Follow-up with Dr. Royann Shivers in the office in 1-2 weeks. 3.  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  Time Spent Directly with the Patient:  45 minutes   Chrystie Nose, MD, [facility] Attending Cardiologist [facility]  Tannis Burstein C 09/15/2012, 1:39 PM

## 2012-09-15 NOTE — Progress Notes (Signed)
DCCV is at 1300hrs today.  Malik Edwards 8:41 AM

## 2012-09-15 NOTE — Progress Notes (Signed)
Pt discharged home via family; Pt and family given and explained all discharge instructions, carenotes, and prescriptions; pt and family stated understanding and denied questions/concerns; all f/u appointments in place; IV removed without complicaitons; pt stable at time of discharge; pt given specific education on diet and smoking; both family and patient stated understanding

## 2012-09-15 NOTE — Progress Notes (Signed)
Subjective: No complaints.  Objective: Vital signs in last 24 hours: Temp:  [97.7 F (36.5 C)-98.4 F (36.9 C)] 97.9 F (36.6 C) (08/18 0341) Pulse Rate:  [78-88] 85 (08/18 0341) Resp:  [16-18] 18 (08/18 0341) BP: (114-145)/(48-85) 145/85 mmHg (08/18 0341) SpO2:  [98 %] 98 % (08/17 1648) Last BM Date: 09/14/12  Intake/Output from previous day: 08/17 0701 - 08/18 0700 In: 240 [P.O.:240] Out: 900 [Urine:900] Intake/Output this shift:    Medications Current Facility-Administered Medications  Medication Dose Route Frequency Provider Last Rate Last Dose  . acetaminophen (TYLENOL) tablet 650 mg  650 mg Oral Q4H PRN Quintella Reichert, MD      . apixaban (ELIQUIS) tablet 5 mg  5 mg Oral BID Thurmon Fair, MD   5 mg at 09/14/12 2210  . carvedilol (COREG) tablet 25 mg  25 mg Oral BID WC Quintella Reichert, MD   25 mg at 09/14/12 1807  . cholecalciferol (VITAMIN D) tablet 1,000 Units  1,000 Units Oral Daily Quintella Reichert, MD   1,000 Units at 09/14/12 1023  . clonazePAM (KLONOPIN) tablet 1 mg  1 mg Oral Q6H PRN Thurmon Fair, MD   1 mg at 09/14/12 2318  . diltiazem (CARDIZEM CD) 24 hr capsule 360 mg  360 mg Oral Daily Mihai Croitoru, MD   360 mg at 09/14/12 1028  . furosemide (LASIX) tablet 40 mg  40 mg Oral Daily Quintella Reichert, MD   40 mg at 09/14/12 1023  . lisinopril (PRINIVIL,ZESTRIL) tablet 10 mg  10 mg Oral Daily Quintella Reichert, MD   10 mg at 09/14/12 1023  . loperamide (IMODIUM) capsule 2 mg  2 mg Oral BID Thurmon Fair, MD   2 mg at 09/14/12 2210  . mesalamine (LIALDA) EC tablet 1.2 g  1,200 mg Oral BID Quintella Reichert, MD   1.2 g at 09/14/12 2210  . nicotine (NICODERM CQ - dosed in mg/24 hours) patch 14 mg  14 mg Transdermal Daily PRN Mihai Croitoru, MD      . ondansetron (ZOFRAN) injection 4 mg  4 mg Intravenous Q6H PRN Quintella Reichert, MD      . potassium chloride (K-DUR,KLOR-CON) CR tablet 10 mEq  10 mEq Oral Daily Mihai Croitoru, MD   10 mEq at 09/14/12 1023  . risperiDONE  (RISPERDAL) tablet 1 mg  1 mg Oral q morning - 10a Quintella Reichert, MD   1 mg at 09/14/12 1024  . risperiDONE (RISPERDAL) tablet 2 mg  2 mg Oral QHS Mihai Croitoru, MD   2 mg at 09/14/12 2210  . sertraline (ZOLOFT) tablet 150 mg  150 mg Oral QHS Quintella Reichert, MD   150 mg at 09/14/12 2210    PE: General appearance: alert, cooperative and no distress Lungs: clear to auscultation bilaterally Heart: irregularly irregular rhythm and No MM Extremities: No LEE Pulses: 2+ and symmetric Skin: Warm and dry Neurologic: Grossly normal  Lab Results:   Recent Labs  09/12/12 1620 09/13/12 0022 09/13/12 0600  WBC 13.1* 11.0* 9.6  HGB 14.9 14.8 14.9  HCT 43.8 42.1 43.8  PLT 295 277 272   BMET  Recent Labs  09/12/12 1620 09/13/12 0022 09/13/12 0600  NA 138 138 138  K 4.7 3.8 4.4  CL 103 104 103  CO2 23 23 24   GLUCOSE 104* 112* 112*  BUN 16 14 12   CREATININE 0.80 0.69 0.74  CALCIUM 10.3 8.8 9.3   PT/INR  Recent Labs  09/12/12 2240 09/13/12 0600  LABPROT 13.9 13.3  INR 1.09 1.03    Assessment/Plan  Principal Problem:   Atrial flutter with rapid ventricular response Active Problems:   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  He remains in Aflutter with controlled rate prior to me going in the room.  His HR easily goes to 160's with any exertion.  On Eliquis.  Labs stable.   LOS: 3 days    Raffaele Derise 09/15/2012 8:23 AM

## 2012-09-15 NOTE — Progress Notes (Signed)
Pt. Seen and examined. Agree with the NP/PA-C note as written.  Hemodynamically stable on Eliquis, with persistent atrial flutter. Onset to time of anticoagulation <48 hrs, agree with proceeding to schedule DCCV at some point today, hopefully bedside.   Chrystie Nose, MD, [facility] Attending Cardiologist [facility]

## 2012-09-15 NOTE — H&P (Signed)
   INTERVAL PROCEDURE H&P  History and Physical Interval Note:  09/15/2012 1:02 PM  Malik Edwards has presented today for their planned procedure. The various methods of treatment have been discussed with the patient and family. After consideration of risks, benefits and other options for treatment, the patient has consented to the procedure.  The patients' outpatient history has been reviewed, patient examined, and no change in status from most recent office note within the past 30 days. I have reviewed the patients' chart and labs and will proceed as planned. Questions were answered to the patient's satisfaction.   Malik Nose, MD, [facility] Attending Cardiologist [facility]  Malik Edwards 09/15/2012, 1:02 PM

## 2012-09-15 NOTE — Progress Notes (Signed)
Pt signed consent for DCCV; stated understanding and denied questions/concerns; family at bedside

## 2012-09-15 NOTE — Preoperative (Signed)
Beta Blockers   Reason not to administer Beta Blockers:Carvedilol given at 0830 hrs on 09/15/12

## 2012-09-16 ENCOUNTER — Encounter (HOSPITAL_COMMUNITY): Payer: Self-pay | Admitting: Internal Medicine

## 2012-09-18 NOTE — Discharge Summary (Signed)
Physician Discharge Summary  Patient ID: Malik Edwards MRN: [id number] DOB/AGE: 05/30/1975 37 y.o.  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 He states that he started having some heart palpitations about 10:30 this morning. This occurred after drinking 2 large Cheerwines with breakfast. He denies any history of SVT or irregular heartbeat in the past. He does have a history of hypertensive cardiomyopathy with an ejection fraction of 35-40%. He went to see his GI MD this am for workup of chronic diarrhea and he noted an elevated HR. He was sent to his Cardiologist who found him to be in SVT and sent him to [facility]. He was given 6mg  of Adeonsine and 12mg  Adenosine that slowed the rhythm down to be able to determine it was atrial fibrillation/flutter with RVR. He was bolused with IV Cardizem and started on a gtt and transferred to [facility]. He denied any chest tightness or shortness of breath. He denies any dizziness, LE edema or syncope. He denies any recent illness. He denies any cough or chest congestion. He denies any leg pain or swelling.   He was admitted to a telemetry bed and continued on IV cardizem and heparin.  2D echo revealed diffuse hypokinesis with an EF of 40-45%.  He underwent successful DCCV with one 150j biphasic shock.  He was started on Eliquis.  The patient was seen by Dr. Rennis Edwards who felt he was stable for DC home. Light duty(<10#) for one week and ok to return to work on Sep 17, 2012.    Consults: None  Significant Diagnostic Studies:  2D echo Study Conclusions  - Left ventricle: The cavity size was normal. Wall thickness was increased in a pattern of mild LVH. Systolic function was mildly to moderately reduced. The estimated ejection fraction was in the range of 40% to 45%. Diffuse hypokinesis. The study is not technically sufficient to allow evaluation of LV diastolic function. - Left atrium: The atrium was mildly dilated. - Right atrium: The atrium was mildly dilated. - Atrial septum: No defect or patent foramen ovale was identified.   Treatments: See above  Discharge Exam: Blood pressure 125/91, pulse 85, temperature 97.9 F (36.6 C), temperature source Oral, resp. rate 18, height 5\' 9"  (1.753 m), weight 311 lb 8.2 oz (141.3 kg), SpO2 98.00%.   Disposition: 01-Home or Self Care  Discharge Orders   Future Appointments Provider Department Dept Phone   10/03/2012 11:45 AM Malik Fair, MD [facility] [place] [phone]   Future Orders Complete By Expires   Diet - low sodium heart healthy  As directed    Discharge instructions  As directed    Comments:     Light duty, no more than 10# of lifting, for one week from today.  Ok to return to work on Wednesday, Sep 17, 2012.  Malik Miklos, PA-C   Increase activity slowly  As directed        Medication List    STOP taking these medications       metroNIDAZOLE 500 MG  tablet  Commonly known as:  FLAGYL      TAKE these medications       apixaban 5 MG Tabs tablet  Commonly known as:  ELIQUIS  Take 1 tablet (5 mg total) by mouth 2 (two) times daily.     carvedilol 25 MG tablet  Commonly known as:  COREG  Take 25 mg by mouth 2 (two) times daily with a meal.     cholecalciferol 1000 UNITS tablet  Commonly known as:  VITAMIN D  Take 1,000 Units by mouth daily.     diltiazem 360 MG 24 hr capsule  Commonly known as:  CARDIZEM CD  Take 1 capsule (360 mg  total) by mouth daily.     furosemide 40 MG tablet  Commonly known as:  LASIX  Take 40 mg by mouth daily.     lisinopril 10 MG tablet  Commonly known as:  PRINIVIL,ZESTRIL  Take 10 mg by mouth daily.     loperamide 2 MG capsule  Commonly known as:  IMODIUM  Take 4 mg by mouth 2 (two) times daily.     mesalamine 1.2 G EC tablet  Commonly known as:  LIALDA  Take 1,200 mg by mouth 2 (two) times daily.     multivitamin tablet  Take 1 tablet by mouth daily.     potassium chloride 10 MEQ tablet  Commonly known as:  K-DUR,KLOR-CON  Take 10 mEq by mouth daily.     risperiDONE 1 MG tablet  Commonly known as:  RISPERDAL  Take 1-2 mg by mouth 2 (two) times daily. 1mg  in the morning and 2mg  in the evening     sertraline 100 MG tablet  Commonly known as:  ZOLOFT  Take 150 mg by mouth at bedtime.           Follow-up Information   Follow up with Malik Fair, MD. (Our office Scheduler will call you with the appt. date and time. )    Specialty:  Cardiology   Contact information:   [address] [place] [address] [phone]       Signed: Wilburt Edwards 09/18/2012, 2:20 PM

## 2012-10-03 ENCOUNTER — Encounter: Payer: Self-pay | Admitting: Cardiovascular Disease

## 2012-10-03 ENCOUNTER — Ambulatory Visit (INDEPENDENT_AMBULATORY_CARE_PROVIDER_SITE_OTHER): Payer: BC Managed Care – PPO | Admitting: Cardiovascular Disease

## 2012-10-03 VITALS — BP 150/100 | HR 89 | Resp 20 | Ht 69.0 in | Wt 315.9 lb

## 2012-10-03 DIAGNOSIS — R0609 Other forms of dyspnea: Secondary | ICD-10-CM

## 2012-10-03 DIAGNOSIS — I429 Cardiomyopathy, unspecified: Secondary | ICD-10-CM

## 2012-10-03 DIAGNOSIS — I4892 Unspecified atrial flutter: Secondary | ICD-10-CM

## 2012-10-03 DIAGNOSIS — I428 Other cardiomyopathies: Secondary | ICD-10-CM

## 2012-10-03 DIAGNOSIS — Z6841 Body Mass Index (BMI) 40.0 and over, adult: Secondary | ICD-10-CM

## 2012-10-03 DIAGNOSIS — R0989 Other specified symptoms and signs involving the circulatory and respiratory systems: Secondary | ICD-10-CM

## 2012-10-03 DIAGNOSIS — I10 Essential (primary) hypertension: Secondary | ICD-10-CM

## 2012-10-03 DIAGNOSIS — R0683 Snoring: Secondary | ICD-10-CM

## 2012-10-03 NOTE — Patient Instructions (Addendum)
Your physician has requested that you have an echocardiogram. Echocardiography is a painless test that uses sound waves to create images of your heart. It provides your doctor with information about the size and shape of your heart and how well your heart's chambers and valves are working. This procedure takes approximately one hour. There are no restrictions for this procedure.  Your physician has recommended that you have a sleep study. This test records several body functions during sleep, including: brain activity, eye movement, oxygen and carbon dioxide blood levels, heart rate and rhythm, breathing rate and rhythm, the flow of air through your mouth and nose, snoring, body muscle movements, and chest and belly movement.  Your physician recommends that you schedule a follow-up appointment in: After tests (2 months)

## 2012-10-12 ENCOUNTER — Encounter: Payer: Self-pay | Admitting: Cardiovascular Disease

## 2012-10-12 NOTE — Assessment & Plan Note (Signed)
Refer for outpatient sleep study.

## 2012-10-12 NOTE — Assessment & Plan Note (Signed)
Mr. Malik Edwards is not a great candidate for chronic anticoagulation with warfarin. He is on numerous medications and has had previous neuropsychiatric problems. I think he is best suited for a novel anticoagulants that will not require prothrombin time monitoring. His atrial flutter was extremely resistant to a rate control strategy while in the hospital. Options for future management of episodes of atrial flutter include antiarrhythmic therapy or early referral for electrophysiology study and radiofrequency ablation. I also think he is not a good candidate for antiarrhythmic therapy since he takes medications that prolong his QT interval such as risperidone. If atrial flutter recurs I think he would best suited for early referral for ablation His electrocardiogram suggested that he had classic counterclockwise typical right atrial flutter which should be well suited for ablation. Atrial fibrillation has not been noted.

## 2012-10-12 NOTE — Assessment & Plan Note (Signed)
Most likely related to untreated hypertension, this is expected to improve considerably with appropriate therapy. Recheck an echo in several weeks. Continue carvedilol and lisinopril. Prohibit alcohol use. Discussed sodium restriction, weight monitoring, signs and symptoms of congestive heart failure.

## 2012-10-12 NOTE — Progress Notes (Signed)
Patient ID: Malik Edwards, male   DOB: 03-Jul-1975, 37 y.o.   MRN: [id number]     Reason for office visit Followup after cardioversion for atrial flutter, cardiomyopathy  Malik Edwards was recently admitted to the hospital after discovery of atrial flutter with rapid ventricular response. She was oriented to have moderate cardiomyopathy with an ejection fraction estimated to be 35-40% by an echo performed in May. At that time Malik Edwards was severely hypertensive and beta blocker and ACE inhibitor therapy was initiated. Reportedly Malik Edwards had a nuclear stress test was described as normal. The atrial arrhythmia was discovered incidentally when Malik Edwards went to see his gastroenterologist for colitis with frequent watery diarrhea. Malik Edwards underwent electrical cardioversion on August 15 and is maintaining sinus rhythm without true antiarrhythmic therapy. Malik Edwards is still receiving carvedilol as well as diltiazem. Malik Edwards generally feels well and denies problems with shortness of breath or chest pain. Malik Edwards has not experienced dizziness or syncope. Malik Edwards does not have lower extremity edema.  When first checked his blood pressure today was 150/100 mm Hg. After relaxing for a while his blood pressure was 138/87 mm Hg. Malik Edwards has a history of posttraumatic stress disorder, there is a diagnosis of psychosis, previous history of heavy alcohol abuse and has a very short temper. Malik Edwards does not have diabetes mellitus despite the fact that Malik Edwards is morbidly obese. Malik Edwards has numerous symptoms strongly suggestive of untreated obstructive sleep apnea and we have planned all along to schedule him for sleep study. Malik Edwards was started on statins in June and developed worsening transaminitis that appears to be secondary to fatty liver.    Allergies  Allergen Reactions  . Statins     Elevated LFTs    Current Outpatient Prescriptions  Medication Sig Dispense Refill  . apixaban (ELIQUIS) 5 MG TABS tablet Take 1 tablet (5 mg total) by mouth 2 (two) times daily.  60 tablet  11  .  carvedilol (COREG) 25 MG tablet Take 25 mg by mouth 2 (two) times daily with a meal.      . cholecalciferol (VITAMIN D) 1000 UNITS tablet Take 1,000 Units by mouth daily.      Marland Kitchen diltiazem (CARDIZEM CD) 360 MG 24 hr capsule Take 1 capsule (360 mg total) by mouth daily.  30 capsule  5  . furosemide (LASIX) 40 MG tablet Take 40 mg by mouth daily.      Marland Kitchen lisinopril (PRINIVIL,ZESTRIL) 10 MG tablet Take 10 mg by mouth daily.      Marland Kitchen loperamide (IMODIUM) 2 MG capsule Take 4 mg by mouth 2 (two) times daily.      . mesalamine (LIALDA) 1.2 G EC tablet Take 1,200 mg by mouth 2 (two) times daily.      . Multiple Vitamin (MULTIVITAMIN) tablet Take 1 tablet by mouth daily.      . potassium chloride (K-DUR,KLOR-CON) 10 MEQ tablet Take 10 mEq by mouth daily.       . risperiDONE (RISPERDAL) 1 MG tablet Take 1-2 mg by mouth 2 (two) times daily. 1mg  in the morning and 2mg  in the evening      . sertraline (ZOLOFT) 100 MG tablet Take 150 mg by mouth at bedtime.       No current facility-administered medications for this visit.    Past Medical History  Diagnosis Date  . Hypertension   . Cardiomyopathy   . Colitis   . Depression     Past Surgical History  Procedure Laterality Date  . Cardioversion N/A 09/15/2012  Procedure: CARDIOVERSION;  Surgeon: Chrystie Nose, MD;  Location: [facility] OR;  Service: Cardiovascular;  Laterality: N/A;    No family history on file.  History   Social History  . Marital Status: Married    Spouse Name: N/A    Number of Children: N/A  . Years of Education: N/A   Occupational History  . Not on file.   Social History Main Topics  . Smoking status: Current Every Day Smoker -- 1.00 packs/day for 15 years    Types: Cigarettes  . Smokeless tobacco: Not on file  . Alcohol Use: Yes     Comment: pt and wife agree Malik Edwards is a social drinker, no hx of addiction/withdrawal  . Drug Use: No  . Sexual Activity: Yes    Birth Control/ Protection: None   Other Topics Concern  . Not  on file   Social History Narrative  . No narrative on file    Review of systems: The patient specifically denies any chest pain at rest or with exertion, dyspnea at rest or with exertion, orthopnea, paroxysmal nocturnal dyspnea, syncope, palpitations, focal neurological deficits, intermittent claudication, lower extremity edema, unexplained weight gain, cough, hemoptysis or wheezing.  The patient also denies abdominal pain, nausea, vomiting, dysphagia, diarrhea, constipation, polyuria, polydipsia, dysuria, hematuria, frequency, urgency, abnormal bleeding or bruising, fever, chills, unexpected weight changes, mood swings, change in skin or hair texture, change in voice quality, auditory or visual problems, allergic reactions or rashes, new musculoskeletal complaints other than usual "aches and pains".   PHYSICAL EXAM BP 150/100  Pulse 89  Resp 20  Ht 5\' 9"  (1.753 m)  Wt 315 lb 14.4 oz (143.291 kg)  BMI 46.63 kg/m2  General: Alert, oriented x3, no distress; morbidly obese, very broad neck circumference Head: no evidence of trauma, PERRL, EOMI, no exophtalmos or lid lag, no myxedema, no xanthelasma; normal ears, nose and oropharynx Neck: normal jugular venous pulsations and no hepatojugular reflux; brisk carotid pulses without delay and no carotid bruits Chest: clear to auscultation, no signs of consolidation by percussion or palpation, normal fremitus, symmetrical and full respiratory excursions Cardiovascular: Difficult to define the position and quality of the apical impulse, regular rhythm, normal first and second heart sounds, no murmurs, rubs or gallops Abdomen: no tenderness or distention, no masses by palpation, no abnormal pulsatility or arterial bruits, normal bowel sounds, no hepatosplenomegaly Extremities: no clubbing, cyanosis or edema; 2+ radial, ulnar and brachial pulses bilaterally; 2+ right femoral, posterior tibial and dorsalis pedis pulses; 2+ left femoral, posterior tibial  and dorsalis pedis pulses; no subclavian or femoral bruits Neurological: grossly nonfocal   EKG: Sinus rhythm, incomplete right bundle branch block corrected QT interval 445 ms  Go describes a mildly dilated left atrium but no enlargement of the right side of the heart. The initial echo on May describes pseudo-normalized mitral inflow. Echocardiogram performed while in atrial flutter described an LV ejection fraction of 40-45% mild biatrial dilatation  Lipid Panel  In May total cholesterol 225 triglycerides 285 HDL 38 LDL 130 Hemoglobin N8G 5.4% Thyroid studies and BNP level in May were normal BMET    Component Value Date/Time   NA 138 09/13/2012 0600   K 4.4 09/13/2012 0600   CL 103 09/13/2012 0600   CO2 24 09/13/2012 0600   GLUCOSE 112* 09/13/2012 0600   BUN 12 09/13/2012 0600   CREATININE 0.74 09/13/2012 0600   CALCIUM 9.3 09/13/2012 0600   GFRNONAA >90 09/13/2012 0600   GFRAA >90 09/13/2012 0600  ASSESSMENT AND PLAN Atrial flutter with rapid ventricular response Malik Edwards is not a great candidate for chronic anticoagulation with warfarin. Malik Edwards is on numerous medications and has had previous neuropsychiatric problems. I think Malik Edwards is best suited for a novel anticoagulants that will not require prothrombin time monitoring. His atrial flutter was extremely resistant to a rate control strategy while in the hospital. Options for future management of episodes of atrial flutter include antiarrhythmic therapy or early referral for electrophysiology study and radiofrequency ablation. I also think Malik Edwards is not a good candidate for antiarrhythmic therapy since Malik Edwards takes medications that prolong his QT interval such as risperidone. If atrial flutter recurs I think Malik Edwards would best suited for early referral for ablation His electrocardiogram suggested that Malik Edwards had classic counterclockwise typical right atrial flutter which should be well suited for ablation. Atrial fibrillation has not been noted.  Morbid  obesity with BMI of 45.0-49.9, adult Refer for outpatient sleep study.  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 Recheck an echo in several weeks. Continue carvedilol and lisinopril. Prohibit alcohol use. Discussed sodium restriction, weight monitoring, signs and symptoms of congestive heart failure.  HTN (hypertension) Control is improved, may not yet be perfect. Home monitoring is encouraged.   Orders Placed This Encounter  Procedures  . EKG 12-Lead  . 2D Echocardiogram with contrast  . Split night study   Meds ordered this encounter  Medications  . DISCONTD: KLOR-CON 10 10 MEQ tablet    Sig:   . DISCONTD: metroNIDAZOLE (FLAGYL) 500 MG tablet    Sig:     Malik Chui  Thurmon Fair, MD, [facility] [phone] office [phone] pager

## 2012-10-12 NOTE — Assessment & Plan Note (Signed)
Control is improved, may not yet be perfect. Home monitoring is encouraged.

## 2012-10-13 ENCOUNTER — Telehealth: Payer: Self-pay | Admitting: Cardiovascular Disease

## 2012-10-13 ENCOUNTER — Other Ambulatory Visit: Payer: Self-pay | Admitting: *Deleted

## 2012-10-13 DIAGNOSIS — R600 Localized edema: Secondary | ICD-10-CM

## 2012-10-13 NOTE — Telephone Encounter (Signed)
Please call-having weird symptoms-might need to be seen.

## 2012-10-13 NOTE — Telephone Encounter (Signed)
Returned call.  Pt c/o BLE edema for a few days.  Stated he developed a sharp pain in his L leg and wanted to know if he needed to be seen.  Denied redness, warmness, CP, SOB and c/o tenderness in L leg only.  Denied color changes in feet.  Asked pt if he is still taking Lasix and he stated he is at 40 mg daily.  Denied having compression stockings.  Stated he is elevating LEs at night and doesn't notice change in swelling after elevation.    Pt informed Dr. Royann Shivers will be notified for further instructions.  Pt verbalized understanding and agreed w/ plan.  Message forwarded to Dr. Royann Shivers.

## 2012-10-13 NOTE — Telephone Encounter (Signed)
Informed patient that Dr. Royann Shivers reviewed the triage message left by Amber. He recommended that patient get bilateral LE duplex ultrasound to r/o a DVT. Advised patient that if he has not heard from the schedulers by at least 10:00 tomorrow morning then he is to call our office and speak with a scheduler. Patient voice understanding. Orders placed in the epic system. Staff message sent to [facility]. For scheduling.

## 2012-10-14 ENCOUNTER — Ambulatory Visit (HOSPITAL_COMMUNITY)
Admission: RE | Admit: 2012-10-14 | Discharge: 2012-10-14 | Disposition: A | Discharge status: Home / Self Care | Payer: BC Managed Care – PPO | Source: Ambulatory Visit | Attending: Cardiovascular Disease | Admitting: Cardiovascular Disease

## 2012-10-14 ENCOUNTER — Ambulatory Visit (INDEPENDENT_AMBULATORY_CARE_PROVIDER_SITE_OTHER): Payer: BC Managed Care – PPO | Admitting: Cardiology

## 2012-10-14 ENCOUNTER — Telehealth: Payer: Self-pay | Admitting: Cardiovascular Disease

## 2012-10-14 ENCOUNTER — Encounter: Payer: Self-pay | Admitting: Cardiology

## 2012-10-14 VITALS — BP 138/74 | HR 89 | Ht 69.0 in | Wt 318.1 lb

## 2012-10-14 DIAGNOSIS — M7989 Other specified soft tissue disorders: Secondary | ICD-10-CM

## 2012-10-14 DIAGNOSIS — Z720 Tobacco use: Secondary | ICD-10-CM

## 2012-10-14 DIAGNOSIS — R600 Localized edema: Secondary | ICD-10-CM

## 2012-10-14 DIAGNOSIS — R609 Edema, unspecified: Secondary | ICD-10-CM

## 2012-10-14 DIAGNOSIS — I429 Cardiomyopathy, unspecified: Secondary | ICD-10-CM

## 2012-10-14 DIAGNOSIS — R079 Chest pain, unspecified: Secondary | ICD-10-CM

## 2012-10-14 DIAGNOSIS — I428 Other cardiomyopathies: Secondary | ICD-10-CM

## 2012-10-14 DIAGNOSIS — F17200 Nicotine dependence, unspecified, uncomplicated: Secondary | ICD-10-CM

## 2012-10-14 DIAGNOSIS — L02419 Cutaneous abscess of limb, unspecified: Secondary | ICD-10-CM

## 2012-10-14 DIAGNOSIS — L03116 Cellulitis of left lower limb: Secondary | ICD-10-CM

## 2012-10-14 MED ORDER — DILTIAZEM HCL ER COATED BEADS 360 MG PO CP24: 360.0000 mg | ORAL_CAPSULE | Freq: Every day | ORAL | Status: DC

## 2012-10-14 MED ORDER — APIXABAN 5 MG PO TABS: 5.0000 mg | ORAL_TABLET | Freq: Two times a day (BID) | ORAL | Status: DC

## 2012-10-14 MED ORDER — CEPHALEXIN 500 MG PO CAPS: 500.0000 mg | ORAL_CAPSULE | Freq: Four times a day (QID) | ORAL | Status: DC

## 2012-10-14 NOTE — Telephone Encounter (Signed)
Appt scheduled for today at 3:30pm

## 2012-10-14 NOTE — Progress Notes (Signed)
Lower Extremity Venous Duplex Completed. °Brianna L Mazza,RVT °

## 2012-10-14 NOTE — Telephone Encounter (Signed)
Pt to be scheduled for BLE duplex (see phone note from 9.15.14).  Spoke w/ Inetta Fermo in scheduling and informed she is about to call pt to set up appt.  Informed appt ASAP.

## 2012-10-14 NOTE — Patient Instructions (Addendum)
Start taking Keflex (antibiotic) 4 times daily, for 10 days.   Increase Lasix to 40 mg twice a day, you may take second dose at 4 pm for 2 days only  increase potassium to 20 meq daily  Follow up on Friday with PA.

## 2012-10-14 NOTE — Telephone Encounter (Signed)
Thinks he might need to be seen today. Left lower leg is hot,red and swollen.

## 2012-10-15 ENCOUNTER — Encounter: Payer: Self-pay | Admitting: Cardiology

## 2012-10-15 DIAGNOSIS — Z720 Tobacco use: Secondary | ICD-10-CM | POA: Insufficient documentation

## 2012-10-15 DIAGNOSIS — L03116 Cellulitis of left lower limb: Secondary | ICD-10-CM | POA: Insufficient documentation

## 2012-10-15 DIAGNOSIS — R600 Localized edema: Secondary | ICD-10-CM | POA: Insufficient documentation

## 2012-10-15 NOTE — Assessment & Plan Note (Addendum)
Patient had notable edema on legs and arms this past weekend after weekend at the beach with increased salt intake. Had one episode of shortness of breath and mild chest discomfort that resolved quickly. For now increase his diuretic to 40 mg of Lasix twice a day and increase his potassium to 20 mEq daily with the increased dose of Lasix. Will do this for 2 days.  He is to be seen back on Friday further adjustments will be decided at that time.

## 2012-10-15 NOTE — Assessment & Plan Note (Signed)
Presents with 2 day history of edema but also left lower leg erythema tenderness and warmth does not extend to the foot. Doppler studies were done which were negative for DVT, positive lymph nodes were present.   Will add Keflex 500 mg 4 times a day to medical regimen for 10 days. I've asked him not to work and we will see him back Friday for further evaluation. One small area on the left lateral aspect the white area questionable bite patient has no recollection of any bug bites.

## 2012-10-15 NOTE — Progress Notes (Signed)
10/15/2012   PCP: Tonye Pearson, MD   Chief Complaint  Patient presents with  . Leg Swelling    LLE - over weekened - painful when walk, does not subside with rest, tender to touch, warm to touch - felt like had fever last nite; at beach over wkend - swelled everywhere with some intermittent CP; SOB has worsened; cardioversion 8/15 (by [facility])  . Medication Refill    diltizaem/eliquis #90 refills  . Results    venous doppler at 330 today    Primary Cardiologist: Dr. Royann Shivers  HPI:  Mr. Malik Edwards was recently admitted to the hospital after discovery of atrial flutter with rapid ventricular response. He was noted to have moderate cardiomyopathy with an ejection fraction estimated to be 35-40% by an echo performed in May. At that time he was severely hypertensive and beta blocker and ACE inhibitor therapy was initiated. Reportedly he had a nuclear stress test was described as normal. The atrial arrhythmia was discovered incidentally when he went to see his gastroenterologist for colitis with frequent watery diarrhea. He underwent electrical cardioversion on August 15 and is maintaining sinus rhythm without true antiarrhythmic therapy. He is still receiving carvedilol as well as diltiazem. On 10/03/12 pt was stable without edema, maintaining SR.  He has a history of posttraumatic stress disorder, there is a diagnosis of psychosis, previous history of heavy alcohol abuse and has a very short temper. He does not have diabetes mellitus despite the fact that he is morbidly obese. He has numerous symptoms strongly suggestive of untreated obstructive sleep apnea and we have planned all along to schedule him for sleep study. He was started on statins in June and developed worsening transaminitis that appears to be secondary to fatty liver.  Patient had called the office with left lower extremity swelling, painful with walking tender to touch and fevers to the leg only. He is here today for  evaluation.  Patient was at the beach this weekend and enjoyed himself with alcohol and eating salty-type foods.  He did develop edema in his hands and his legs and in his arms with one episode of mild shortness of breath, 1 mild chest discomfort more epigastric which he thought was probably indigestion and with his food and alcohol intake most likely was. From the time he came home his left lower extremity was red to touch hot to touch and swollen the swelling continued.  He is going to have a lower extremity venous Doppler today as well.   he denies any systemic fevers. He is not aware of any bug bites there is an area on his leg laterally quite indented area questionable bite versus just pre-ulcer condition with edema.  No nausea or vomiting pain only with walking.      Allergies  Allergen Reactions  . Statins     Elevated LFTs    Current Outpatient Prescriptions  Medication Sig Dispense Refill  . apixaban (ELIQUIS) 5 MG TABS tablet Take 1 tablet (5 mg total) by mouth 2 (two) times daily.  180 tablet  2  . carvedilol (COREG) 25 MG tablet Take 25 mg by mouth 2 (two) times daily with a meal.      . cholecalciferol (VITAMIN D) 1000 UNITS tablet Take 1,000 Units by mouth daily.      Marland Kitchen diltiazem (CARDIZEM CD) 360 MG 24 hr capsule Take 1 capsule (360 mg total) by mouth daily.  90 capsule  2  . furosemide (LASIX) 40 MG  tablet Take 40 mg by mouth daily.      Marland Kitchen lisinopril (PRINIVIL,ZESTRIL) 10 MG tablet Take 10 mg by mouth daily.      Marland Kitchen loperamide (IMODIUM) 2 MG capsule Take 4 mg by mouth 2 (two) times daily.      . mesalamine (LIALDA) 1.2 G EC tablet Take 1,200 mg by mouth 2 (two) times daily.      . Multiple Vitamin (MULTIVITAMIN) tablet Take 1 tablet by mouth daily.      . potassium chloride (K-DUR,KLOR-CON) 10 MEQ tablet Take 10 mEq by mouth daily.       . risperiDONE (RISPERDAL) 1 MG tablet Take 1-2 mg by mouth 2 (two) times daily. 1mg  in the morning and 2mg  in the evening      . sertraline  (ZOLOFT) 100 MG tablet Take 150 mg by mouth at bedtime.      . cephALEXin (KEFLEX) 500 MG capsule Take 1 capsule (500 mg total) by mouth 4 (four) times daily.  40 capsule  0   No current facility-administered medications for this visit.    Past Medical History  Diagnosis Date  . Hypertension   . Cardiomyopathy   . Colitis   . Depression     Past Surgical History  Procedure Laterality Date  . Cardioversion N/A 09/15/2012    Procedure: CARDIOVERSION;  Surgeon: Chrystie Nose, MD;  Location: [facility] OR;  Service: Cardiovascular;  Laterality: N/A;    ZOX:WRUEAVW:UJ colds or fevers, no weight changes Skin:no rashes or ulcers- red lower lt leg and hot to touch HEENT:no blurred vision, no congestion CV:see HPI PUL:see HPI GI:no diarrhea constipation or melena, ? Indigestion this weekend GU:no hematuria, no dysuria MS:no joint pain, no claudication Neuro:no syncope, no lightheadedness Endo:no diabetes, no thyroid disease  PHYSICAL EXAM BP 138/74  Pulse 89  Ht 5\' 9"  (1.753 m)  Wt 318 lb 1.6 oz (144.289 kg)  BMI 46.95 kg/m2 General:Pleasant affect, NAD Skin:Warm and dry, brisk capillary refill HEENT:normocephalic, sclera clear, mucus membranes moist Neck:supple, no JVD, no bruits  Heart:S1S2 RRR without murmur, gallup, rub or click Lungs:clear without rales, rhonchi, or wheezes WJX:BJYNW,GNFA, non tender, + BS, do not palpate liver spleen or masses Ext:1-2+ lower ext edema, lt. Leg below knee to ankle bright red warm to touch.  Small lateral area of white area though no ulceration no obvious bite  2+ pedal pulses, 2+ radial pulses Neuro:alert and oriented, MAE, follows commands, + facial symmetry  EKG:SR with incomplete RBBB.  No changes from previous EKG, maintaining SR.  ASSESSMENT AND PLAN Cellulitis of left lower leg Presents with 2 day history of edema but also left lower leg erythema tenderness and warmth does not extend to the foot. Doppler studies were done which were  negative for DVT, positive lymph nodes were present.   Will add Keflex 500 mg 4 times a day to medical regimen for 10 days. I've asked him not to work and we will see him back Friday for further evaluation. One small area on the left lateral aspect the white area questionable bite patient has no recollection of any bug bites.  Edema of both legs Patient had notable edema on legs and arms this past weekend after weekend at the beach with increased salt intake. Had one episode of shortness of breath and mild chest discomfort that resolved quickly. For now increase his diuretic to 40 mg of Lasix twice a day and increase his potassium to 20 mEq daily with the increased dose of Lasix.  Will do this for 2 days.  He is to be seen back on Friday further adjustments will be decided at that time.  Cardiomyopathy- etiol uindetermined- EF reportedly 35-40% See above, but no SOB or chest pain now.  Tobacco use Continues to smoke, we've discussed options of stopping he did state he bought Nicorette gum. I instructed him if he uses it Nicoderm patch he should not smoke with that in place it could cause cardiac issues.

## 2012-10-15 NOTE — Assessment & Plan Note (Signed)
See above, but no SOB or chest pain now.

## 2012-10-15 NOTE — Assessment & Plan Note (Signed)
Continues to smoke, we've discussed options of stopping he did state he bought Nicorette gum. I instructed him if he uses it Nicoderm patch he should not smoke with that in place it could cause cardiac issues.

## 2012-10-17 ENCOUNTER — Encounter: Payer: Self-pay | Admitting: Physician Assistant

## 2012-10-17 ENCOUNTER — Ambulatory Visit (INDEPENDENT_AMBULATORY_CARE_PROVIDER_SITE_OTHER): Payer: BC Managed Care – PPO | Admitting: Physician Assistant

## 2012-10-17 VITALS — BP 158/100 | HR 72 | Temp 98.7°F | Ht 69.0 in | Wt 318.4 lb

## 2012-10-17 DIAGNOSIS — R609 Edema, unspecified: Secondary | ICD-10-CM

## 2012-10-17 DIAGNOSIS — L03116 Cellulitis of left lower limb: Secondary | ICD-10-CM

## 2012-10-17 DIAGNOSIS — L02419 Cutaneous abscess of limb, unspecified: Secondary | ICD-10-CM

## 2012-10-17 DIAGNOSIS — Z6841 Body Mass Index (BMI) 40.0 and over, adult: Secondary | ICD-10-CM

## 2012-10-17 DIAGNOSIS — R600 Localized edema: Secondary | ICD-10-CM

## 2012-10-17 NOTE — Patient Instructions (Signed)
Continue Kelfex until finished.  Continue increase lasix dosing until back to dry weight ~ 311#  I will refer you for dietary consult.  Follow up with Dr. Salena Saner in November.  Have a [place] weekend!!

## 2012-10-17 NOTE — Assessment & Plan Note (Signed)
I have asked the patient to continue increased dose of Lasix until back to baseline weight of approximately 311 pounds and then resume previous dose.

## 2012-10-17 NOTE — Assessment & Plan Note (Signed)
Symptomatic improvement.  Patient will continue Keflex for the duration.

## 2012-10-17 NOTE — Progress Notes (Signed)
Date:  10/17/2012   ID:  Malik Edwards, DOB [birth year], MRN [id number]  PCP:  Tonye Pearson, MD  Primary Cardiologist:  Croitor[facility]     History of Present Illness: Malik Edwards is a 37 y.o. male recently admitted to the hospital after discovery of atrial flutter with rapid ventricular response. He was noted to have moderate cardiomyopathy with an ejection fraction estimated to be 35-40% by an echo performed in May. At that time he was severely hypertensive and beta blocker and ACE inhibitor therapy was initiated. Reportedly he had a nuclear stress test was described as normal. The atrial arrhythmia was discovered incidentally when he went to see his gastroenterologist for colitis with frequent watery diarrhea. He underwent electrical cardioversion on August 15 and is maintaining sinus rhythm without true antiarrhythmic therapy. He is still receiving carvedilol as well as diltiazem. On 10/03/12 pt was stable without edema, maintaining SR.   He has a history of posttraumatic stress disorder, there is a diagnosis of psychosis, previous history of heavy alcohol abuse and has a very short temper. He does not have diabetes mellitus despite the fact that he is morbidly obese. He has numerous symptoms strongly suggestive of untreated obstructive sleep apnea and we have planned all along to schedule him for sleep study. He was started on statins in June and developed worsening transaminitis that appears to be secondary to fatty liver.   The patient was seen by Alease Frame on September 15. Started him on Keflex 4 times daily for lower extremity cellulitis. She also increased his Lasix to 40 mg for 2 days.  Did have worsening venous Dopplers which were negative for DVT.  At that time his left lower extremity was swollen, red to touch and hot. His right lower extremity was also edematous but without erythema.   He presents today for a followup evaluation.  He reports considerable improvement in his left  lower extremity. Previously he had erythema only up to his knee and that is decreased to mid shin. He was also having pain with ambulation which has essentially resolved.  He had a fever this past Monday but has had none since.  Emotionally edema still persists although he did notice increased urinary output the first couple days with increased Lasix.  The patient currently denies nausea, vomiting, shortness of breath beyond baseline, orthopnea, dizziness, PND, cough, congestion, claudication.  Wt Readings from Last 3 Encounters:  10/17/12 318 lb 6.4 oz (144.425 kg)  10/14/12 318 lb 1.6 oz (144.289 kg)  10/03/12 315 lb 14.4 oz (143.291 kg)     Past Medical History  Diagnosis Date  . Hypertension   . Cardiomyopathy   . Colitis   . Depression     Current Outpatient Prescriptions  Medication Sig Dispense Refill  . apixaban (ELIQUIS) 5 MG TABS tablet Take 1 tablet (5 mg total) by mouth 2 (two) times daily.  180 tablet  2  . carvedilol (COREG) 25 MG tablet Take 25 mg by mouth 2 (two) times daily with a meal.      . cephALEXin (KEFLEX) 500 MG capsule Take 1 capsule (500 mg total) by mouth 4 (four) times daily.  40 capsule  0  . cholecalciferol (VITAMIN D) 1000 UNITS tablet Take 1,000 Units by mouth daily.      Marland Kitchen diltiazem (CARDIZEM CD) 360 MG 24 hr capsule Take 1 capsule (360 mg total) by mouth daily.  90 capsule  2  . furosemide (LASIX) 40 MG tablet Take  40 mg by mouth daily.      Marland Kitchen lisinopril (PRINIVIL,ZESTRIL) 10 MG tablet Take 10 mg by mouth daily.      Marland Kitchen loperamide (IMODIUM) 2 MG capsule Take 4 mg by mouth 2 (two) times daily.      . mesalamine (LIALDA) 1.2 G EC tablet Take 1,200 mg by mouth 2 (two) times daily.      . Multiple Vitamin (MULTIVITAMIN) tablet Take 1 tablet by mouth daily.      . potassium chloride (K-DUR,KLOR-CON) 10 MEQ tablet Take 10 mEq by mouth daily.       . risperiDONE (RISPERDAL) 1 MG tablet Take 1-2 mg by mouth 2 (two) times daily. 1mg  in the morning and 2mg  in  the evening      . sertraline (ZOLOFT) 100 MG tablet Take 150 mg by mouth at bedtime.       No current facility-administered medications for this visit.    Allergies:    Allergies  Allergen Reactions  . Statins     Elevated LFTs    Social History:  The patient  reports that he has been smoking Cigarettes.  He has a 7.5 pack-year smoking history. He has never used smokeless tobacco. He reports that he drinks about 6.0 ounces of alcohol per week. He reports that he does not use illicit drugs.   Family history:   Family History  Problem Relation Age of Onset  . Healthy Mother   . Healthy Father     ROS:  Please see the history of present illness.  All other systems reviewed and negative.   PHYSICAL EXAM: VS:  BP 158/100  Pulse 72  Temp(Src) 98.7 F (37.1 C)  Ht 5\' 9"  (1.753 m)  Wt 318 lb 6.4 oz (144.425 kg)  BMI 47 kg/m2 Obese, well developed, in no acute distress HEENT: Pupils are equal round react to light accommodation extraocular movements are intact.  Neck: No cervical lymphadenopathy. Cardiac: Regular rate and rhythm without murmurs rubs or gallops. Lungs:  clear to auscultation bilaterally, no wheezing, rhonchi or rales Ext: no lower extremity edema.  2+ radial and dorsalis pedis pulses. Skin: Regular extremity: 2+ edema with erythema up to mid shin.  Mild Calor Neuro:  Grossly normal     ASSESSMENT AND PLAN:  Problem List Items Addressed This Visit   Morbid obesity with BMI of 45.0-49.9, adult (Chronic)      Patient will be scheduled for a consult for medical nutrition therapy.    Edema of both legs     I have asked the patient to continue increased dose of Lasix until back to baseline weight of approximately 311 pounds and then resume previous dose.    Cellulitis of left lower leg - Primary     Symptomatic improvement.  Patient will continue Keflex for the duration.

## 2012-10-17 NOTE — Assessment & Plan Note (Signed)
Patient will be scheduled for a consult for medical nutrition therapy.

## 2012-11-04 ENCOUNTER — Encounter: Payer: Self-pay | Admitting: Cardiovascular Disease

## 2012-11-06 ENCOUNTER — Ambulatory Visit (HOSPITAL_COMMUNITY): Payer: BC Managed Care – PPO

## 2012-11-25 ENCOUNTER — Ambulatory Visit (HOSPITAL_COMMUNITY)
Admission: RE | Admit: 2012-11-25 | Discharge: 2012-11-25 | Disposition: A | Discharge status: Home / Self Care | Payer: BC Managed Care – PPO | Source: Ambulatory Visit | Attending: Cardiovascular Disease | Admitting: Cardiovascular Disease

## 2012-11-25 DIAGNOSIS — I428 Other cardiomyopathies: Secondary | ICD-10-CM | POA: Insufficient documentation

## 2012-11-25 DIAGNOSIS — I429 Cardiomyopathy, unspecified: Secondary | ICD-10-CM

## 2012-11-25 NOTE — Progress Notes (Signed)
2D Echo Performed 11/25/2012    Claryssa Sandner, RCS  

## 2012-12-04 ENCOUNTER — Ambulatory Visit (INDEPENDENT_AMBULATORY_CARE_PROVIDER_SITE_OTHER): Payer: BC Managed Care – PPO | Admitting: Cardiovascular Disease

## 2012-12-04 ENCOUNTER — Encounter: Payer: Self-pay | Admitting: Cardiovascular Disease

## 2012-12-04 VITALS — BP 149/94 | HR 82 | Ht 69.0 in | Wt 336.0 lb

## 2012-12-04 DIAGNOSIS — I4892 Unspecified atrial flutter: Secondary | ICD-10-CM

## 2012-12-04 DIAGNOSIS — Z23 Encounter for immunization: Secondary | ICD-10-CM

## 2012-12-04 DIAGNOSIS — Z720 Tobacco use: Secondary | ICD-10-CM

## 2012-12-04 DIAGNOSIS — R600 Localized edema: Secondary | ICD-10-CM

## 2012-12-04 DIAGNOSIS — I10 Essential (primary) hypertension: Secondary | ICD-10-CM

## 2012-12-04 DIAGNOSIS — I429 Cardiomyopathy, unspecified: Secondary | ICD-10-CM

## 2012-12-04 DIAGNOSIS — Z6841 Body Mass Index (BMI) 40.0 and over, adult: Secondary | ICD-10-CM

## 2012-12-04 DIAGNOSIS — I428 Other cardiomyopathies: Secondary | ICD-10-CM

## 2012-12-04 DIAGNOSIS — R609 Edema, unspecified: Secondary | ICD-10-CM

## 2012-12-04 DIAGNOSIS — F17200 Nicotine dependence, unspecified, uncomplicated: Secondary | ICD-10-CM

## 2012-12-04 MED ORDER — FUROSEMIDE 40 MG PO TABS: 80.0000 mg | ORAL_TABLET | Freq: Every day | ORAL | Status: DC

## 2012-12-04 MED ORDER — POTASSIUM CHLORIDE CRYS ER 20 MEQ PO TBCR: 20.0000 meq | EXTENDED_RELEASE_TABLET | Freq: Every day | ORAL | Status: DC

## 2012-12-04 MED ORDER — AMLODIPINE BESYLATE 5 MG PO TABS: 5.0000 mg | ORAL_TABLET | Freq: Every day | ORAL | Status: DC

## 2012-12-04 NOTE — Patient Instructions (Addendum)
Your physician recommends that you weigh, daily, at the same time every day, and in the same amount of clothing. Please record your daily weights and bring it to your next appointment.   STOP Eliquis.  STOP Diltiazem (Cardizem).   START Amlodipine 5mg  daily.  INCREASE Furosemide to 80mg  daily.  INCREASE Potassium to daily.  Your physician recommends that you schedule a follow-up appointment in: ONE MONTH.

## 2012-12-06 NOTE — Assessment & Plan Note (Signed)
He improved on higher dose furosemide, but fluid retention returned when he backed down to lower dose. We identified a couple of sodium rich foods in his diet, but he is mostly compliant with salt restriction. Increase diuretic dose. Daily weight monitoring.

## 2012-12-06 NOTE — Assessment & Plan Note (Signed)
Weight loss is very important in the long run. His antipsychotic medications may be contributing to weight gain and can cause adverse metabolic changes.  Sleep study results pending.

## 2012-12-06 NOTE — Assessment & Plan Note (Addendum)
No clinically recognized recurrence of arrhythmia since cardioversion in August. He was never aware of palpitations and cannot exclude asymptomatic events. If sustained atrial flutter returns, I would recommend referral to RF ablation rather than long term antiarrhythmics. Note risk of excessive QT prolongation with antipsychotic meds. Continue anticoagulation.

## 2012-12-06 NOTE — Assessment & Plan Note (Addendum)
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 Continue carvedilol and lisinopril for BP control. Stop diltiazem, since rate control no longer a priority and has negative inotropic effect - will use amlodipine instead. Residual findings of edema are probably right-heart failure related, but could not evaluate left ventricular diastolic function (tissue Doppler not recorded on follow up study).

## 2012-12-06 NOTE — Progress Notes (Signed)
Patient ID: Malik Edwards, male   DOB: March 05, 1975, 37 y.o.   MRN: [id number]      Reason for office visit Edema, atrial flutter, CHF  He is now 2 months s/p cardioversion for atrial flutter with RVR and moderate cardiomyopathy with CHF. Repeat echo shows substantial improvement in EF, now almost normal. Edema remains a problem.  After increasing diuretics his edema improved, but he gained 15 lb back after cutting furosemide back to usual dose. Swelling up to his knees bilaterally. Generally compliant with dietary sodium restriction, he was eating a couple of higher salt foods without realizing it. Had sleep study, but results are not available yet. No tachycardia that he is aware of. Leg Doppler negative for DVT.  Geodon added to his risperidone, doxepin, sertraline and trihexiphenidyl.   Allergies  Allergen Reactions  . Statins     Elevated LFTs    Current Outpatient Prescriptions  Medication Sig Dispense Refill  . carvedilol (COREG) 25 MG tablet Take 25 mg by mouth 2 (two) times daily with a meal.      . cholecalciferol (VITAMIN D) 1000 UNITS tablet Take 1,000 Units by mouth daily.      . furosemide (LASIX) 40 MG tablet Take 2 tablets (80 mg total) by mouth daily.  180 tablet  3  . lisinopril (PRINIVIL,ZESTRIL) 10 MG tablet Take 10 mg by mouth daily.      Marland Kitchen loperamide (IMODIUM) 2 MG capsule Take 4 mg by mouth 2 (two) times daily.      . Multiple Vitamin (MULTIVITAMIN) tablet Take 1 tablet by mouth daily.      . risperiDONE (RISPERDAL) 1 MG tablet Take 1 mg by mouth 2 (two) times daily.       . sertraline (ZOLOFT) 100 MG tablet Take 150 mg by mouth at bedtime.      . ziprasidone (GEODON) 60 MG capsule Take 120 mg by mouth every evening.       Marland Kitchen amLODipine (NORVASC) 5 MG tablet Take 1 tablet (5 mg total) by mouth daily.  180 tablet  3  . doxepin (SINEQUAN) 25 MG capsule Take 25 mg by mouth as needed.      . potassium chloride SA (K-DUR,KLOR-CON) 20 MEQ tablet Take 1 tablet (20 mEq  total) by mouth daily.  90 tablet  3  . trihexyphenidyl (ARTANE) 2 MG tablet Take 2 mg by mouth 2 (two) times daily.       No current facility-administered medications for this visit.    Past Medical History  Diagnosis Date  . Hypertension   . Cardiomyopathy   . Colitis   . Depression     Past Surgical History  Procedure Laterality Date  . Cardioversion N/A 09/15/2012    Procedure: CARDIOVERSION;  Surgeon: Chrystie Nose, MD;  Location: [facility] OR;  Service: Cardiovascular;  Laterality: N/A;    Family History  Problem Relation Age of Onset  . Healthy Mother   . Healthy Father     History   Social History  . Marital Status: Married    Spouse Name: N/A    Number of Children: N/A  . Years of Education: N/A   Occupational History  . Not on file.   Social History Main Topics  . Smoking status: Current Every Day Smoker -- 0.50 packs/day for 15 years    Types: Cigarettes  . Smokeless tobacco: Never Used  . Alcohol Use: 6.0 oz/week    12 drink(s) per week     Comment: pt  and wife agree he is a social drinker, no hx of addiction/withdrawal  . Drug Use: No  . Sexual Activity: Yes    Birth Control/ Protection: None   Other Topics Concern  . Not on file   Social History Narrative  . No narrative on file    Review of systems: The patient specifically denies any chest pain at rest or with exertion, dyspnea at rest or with exertion, orthopnea, paroxysmal nocturnal dyspnea, syncope, palpitations, focal neurological deficits, intermittent claudication, cough, hemoptysis or wheezing.  The patient also denies abdominal pain, nausea, vomiting, dysphagia, diarrhea, constipation, polyuria, polydipsia, dysuria, hematuria, frequency, urgency, abnormal bleeding or bruising, fever, chills, unexpected weight changes, mood swings, change in skin or hair texture, change in voice quality, auditory or visual problems, allergic reactions or rashes, new musculoskeletal complaints other than  usual "aches and pains".   PHYSICAL EXAM BP 149/94  Pulse 82  Ht 5\' 9"  (1.753 m)  Wt 336 lb (152.409 kg)  BMI 49.60 kg/m2  General: Alert, oriented x3, no distress Head: no evidence of trauma, PERRL, EOMI, no exophtalmos or lid lag, no myxedema, no xanthelasma; normal ears, nose and oropharynx Neck: normal jugular venous pulsations and no hepatojugular reflux; brisk carotid pulses without delay and no carotid bruits Chest: clear to auscultation, no signs of consolidation by percussion or palpation, normal fremitus, symmetrical and full respiratory excursions Cardiovascular: normal position and quality of the apical impulse, regular rhythm, normal first and second heart sounds, no murmurs, rubs or gallops Abdomen: no tenderness or distention, no masses by palpation, no abnormal pulsatility or arterial bruits, normal bowel sounds, no hepatosplenomegaly Extremities: no clubbing, cyanosis or edema; 2+ radial, ulnar and brachial pulses bilaterally; 2+ right femoral, posterior tibial and dorsalis pedis pulses; 2+ left femoral, posterior tibial and dorsalis pedis pulses; no subclavian or femoral bruits Neurological: grossly nonfocal    BMET    Component Value Date/Time   NA 138 09/13/2012 0600   K 4.4 09/13/2012 0600   CL 103 09/13/2012 0600   CO2 24 09/13/2012 0600   GLUCOSE 112* 09/13/2012 0600   BUN 12 09/13/2012 0600   CREATININE 0.74 09/13/2012 0600   CALCIUM 9.3 09/13/2012 0600   GFRNONAA >90 09/13/2012 0600   GFRAA >90 09/13/2012 0600     ASSESSMENT AND PLAN Atrial flutter with rapid ventricular response No clinically recognized recurrence of arrhythmia since cardioversion in August. He was never aware of palpitations and cannot exclude asymptomatic events. If sustained atrial flutter returns, I would recommend referral to RF ablation rather than long term antiarrhythmics. Note risk of excessive QT prolongation with antipsychotic meds. Continue anticoagulation.  Cardiomyopathy- etiol  uindetermined- EF reportedly 35-40% Near complete recovery of myocardial function suggests that this was mostly, if not entirely tachycardia related CMP. Continue carvedilol and lisinopril for BP control. Stop diltiazem, since rate control no longer a priority and has negative inotropic effect - will use amlodipine instead. Residual findings of edema are probably right-heart failure related, but could not evaluate left ventricular diastolic function (tissue Doppler not recorded on follow up study).  Edema of both legs He improved on higher dose furosemide, but fluid retention returned when he backed down to lower dose. We identified a couple of sodium rich foods in his diet, but he is mostly compliant with salt restriction. Increase diuretic dose. Daily weight monitoring.  HTN (hypertension)    Morbid obesity with BMI of 45.0-49.9, adult Weight loss is very important in the long run. His antipsychotic medications may be contributing  to weight gain and can cause adverse metabolic changes.  Sleep study results pending.  Tobacco use Trying nicotine replacement products.   Orders Placed This Encounter  Procedures  . Flu Vaccine QUAD 36+ mos IM   Meds ordered this encounter  Medications  . doxepin (SINEQUAN) 25 MG capsule    Sig: Take 25 mg by mouth as needed.  . trihexyphenidyl (ARTANE) 2 MG tablet    Sig: Take 2 mg by mouth 2 (two) times daily.  . ziprasidone (GEODON) 60 MG capsule    Sig: Take 120 mg by mouth every evening.   . potassium chloride SA (K-DUR,KLOR-CON) 20 MEQ tablet    Sig: Take 1 tablet (20 mEq total) by mouth daily.    Dispense:  90 tablet    Refill:  3  . amLODipine (NORVASC) 5 MG tablet    Sig: Take 1 tablet (5 mg total) by mouth daily.    Dispense:  180 tablet    Refill:  3  . furosemide (LASIX) 40 MG tablet    Sig: Take 2 tablets (80 mg total) by mouth daily.    Dispense:  180 tablet    Refill:  3    Kanani Mowbray  Thurmon Fair, MD, [facility]  [facility] [phone] office [phone] pager

## 2012-12-06 NOTE — Assessment & Plan Note (Signed)
Trying nicotine replacement products.

## 2012-12-12 ENCOUNTER — Telehealth: Payer: Self-pay | Admitting: Cardiovascular Disease

## 2012-12-12 NOTE — Telephone Encounter (Signed)
Spoke with carrie @ [facility] in reference to patient being called to come back for a second night CPAP titration study. (Patient was scheduled to get a split-night study) per Lyla Son the patient did not meet the criteria for the split-night, therefore he has to have a "second" night study. Lyla Son states that she explained this to them when she called to schedule the appointment. She will call and explain the process to them again.

## 2012-12-12 NOTE — Telephone Encounter (Signed)
Is calling about sleep study results please call .Marland Kitchen    Thanks

## 2012-12-12 NOTE — Telephone Encounter (Signed)
Returned call and pt verified x 2 w/ Katrina, pt's wife.  Stated Dr. Salena Saner ordered a split-night sleep study and pt had it done, but they did not wake him up to put on the CPAP.  Stated she is concerned b/c they received a call for pt to come back on next Thursday and they have high medical debt right now and cannot afford another coinsurance payment.  Stated she will be fighting it if they messed up and did the wrong thing bc Dr. Salena Saner ordered a split-night study.  Wife informed Burna Mortimer, CMA will be notified as she will be a better source r/t her working w/ Dr. Tresa Endo who reads the studies.  Wife verbalized understanding and agreed w/ plan.  Message forwarded to [place], [place] to contact wife.

## 2013-01-09 ENCOUNTER — Ambulatory Visit (INDEPENDENT_AMBULATORY_CARE_PROVIDER_SITE_OTHER): Payer: BC Managed Care – PPO | Admitting: Cardiovascular Disease

## 2013-01-09 ENCOUNTER — Encounter: Payer: Self-pay | Admitting: Cardiovascular Disease

## 2013-01-09 VITALS — BP 144/89 | HR 86 | Resp 16 | Ht 69.0 in | Wt 322.0 lb

## 2013-01-09 DIAGNOSIS — I429 Cardiomyopathy, unspecified: Secondary | ICD-10-CM

## 2013-01-09 DIAGNOSIS — I428 Other cardiomyopathies: Secondary | ICD-10-CM

## 2013-01-09 DIAGNOSIS — G4733 Obstructive sleep apnea (adult) (pediatric): Secondary | ICD-10-CM

## 2013-01-09 DIAGNOSIS — I4892 Unspecified atrial flutter: Secondary | ICD-10-CM

## 2013-01-09 NOTE — Patient Instructions (Addendum)
Your physician recommends that you schedule a follow-up appointment in: 12 months.  

## 2013-01-22 ENCOUNTER — Encounter: Payer: Self-pay | Admitting: Cardiovascular Disease

## 2013-01-22 DIAGNOSIS — G4733 Obstructive sleep apnea (adult) (pediatric): Secondary | ICD-10-CM

## 2013-01-22 HISTORY — DX: Obstructive sleep apnea (adult) (pediatric): G47.33

## 2013-01-22 NOTE — Assessment & Plan Note (Signed)
Now that his hypertension is well treated and his obstructive sleep apnea will soon be well treated, we can hope that there will be a lower likelihood of arrhythmia recurrence. He has a low risk of cardioembolic events and will take aspirin for stroke prevention. If his arrhythmia recurs, rather than try potentially risky antiarrhythmic agents and commit him to long-term anticoagulation, I would prefer to refer him for early cavotricuspid isthmus ablation.

## 2013-01-22 NOTE — Progress Notes (Signed)
Patient ID: Malik Edwards, male   DOB: 07/14/1975, 37 y.o.   MRN: [id number]      Reason for office visit Nonischemic cardiomyopathy, atrial flutter, HTN, followupsleep study  Mr. Mazzoni presented with atrial flutter with rapid ventricular response and was very difficult to rate control. Left ventricular systolic function was depressed with an ejection fraction of 35-40%. He had a lot of edema, although he did not have much in the way of heart failure manifestations. After 4 months since cardioversion he has been feeling quite well and there has been no clinical recurrence of arrhythmia. His echocardiogram shows almost complete normalization of left ventricular systolic function. There no significant valvular abnormalities. His sleep study confirmed obstructive sleep apnea. He is in the process of adjusting to the need to wear CPAP.  He takes numerous antipsychotic medications including agents that can prolong the QT interval and could potentially have dangerous interactions with any antiarrhythmic that we could use. Anticoagulation with Eliquis was discontinued last month.   Allergies  Allergen Reactions  . Statins     Elevated LFTs    Current Outpatient Prescriptions  Medication Sig Dispense Refill  . amLODipine (NORVASC) 5 MG tablet Take 1 tablet (5 mg total) by mouth daily.  180 tablet  3  . carvedilol (COREG) 25 MG tablet Take 25 mg by mouth 2 (two) times daily with a meal.      . cholecalciferol (VITAMIN D) 1000 UNITS tablet Take 1,000 Units by mouth daily.      Marland Kitchen doxepin (SINEQUAN) 25 MG capsule Take 25 mg by mouth as needed.      . furosemide (LASIX) 40 MG tablet Take 2 tablets (80 mg total) by mouth daily.  180 tablet  3  . lisinopril (PRINIVIL,ZESTRIL) 10 MG tablet Take 10 mg by mouth daily.      . Multiple Vitamin (MULTIVITAMIN) tablet Take 1 tablet by mouth daily.      . potassium chloride SA (K-DUR,KLOR-CON) 20 MEQ tablet Take 1 tablet (20 mEq total) by mouth daily.  90  tablet  3  . sertraline (ZOLOFT) 100 MG tablet Take 150 mg by mouth at bedtime.      . trihexyphenidyl (ARTANE) 2 MG tablet Take 2 mg by mouth 2 (two) times daily.      . ziprasidone (GEODON) 60 MG capsule Take 120 mg by mouth every evening.       . loperamide (IMODIUM) 2 MG capsule Take 4 mg by mouth 2 (two) times daily.      . risperiDONE (RISPERDAL) 1 MG tablet Take 1 mg by mouth 2 (two) times daily.        No current facility-administered medications for this visit.    Past Medical History  Diagnosis Date  . Hypertension   . Cardiomyopathy   . Colitis   . Depression     Past Surgical History  Procedure Laterality Date  . Cardioversion N/A 09/15/2012    Procedure: CARDIOVERSION;  Surgeon: Chrystie Nose, MD;  Location: [facility] OR;  Service: Cardiovascular;  Laterality: N/A;    Family History  Problem Relation Age of Onset  . Healthy Mother   . Healthy Father     History   Social History  . Marital Status: Married    Spouse Name: N/A    Number of Children: N/A  . Years of Education: N/A   Occupational History  . Not on file.   Social History Main Topics  . Smoking status: Current Every Day Smoker --  0.50 packs/day for 15 years    Types: Cigarettes  . Smokeless tobacco: Never Used  . Alcohol Use: 6.0 oz/week    12 drink(s) per week     Comment: pt and wife agree he is a social drinker, no hx of addiction/withdrawal  . Drug Use: No  . Sexual Activity: Yes    Birth Control/ Protection: None   Other Topics Concern  . Not on file   Social History Narrative  . No narrative on file    Review of systems: The patient specifically denies any chest pain at rest or with exertion, dyspnea at rest or with exertion, orthopnea, paroxysmal nocturnal dyspnea, syncope, palpitations, focal neurological deficits, intermittent claudication, cough, hemoptysis or wheezing.  The patient also denies abdominal pain, nausea, vomiting, dysphagia, diarrhea, constipation, polyuria,  polydipsia, dysuria, hematuria, frequency, urgency, abnormal bleeding or bruising, fever, chills, unexpected weight changes, mood swings, change in skin or hair texture, change in voice quality, auditory or visual problems, allergic reactions or rashes, new musculoskeletal complaints other than usual "aches and pains".   PHYSICAL EXAM BP 144/89  Pulse 86  Resp 16  Ht 5\' 9"  (1.753 m)  Wt 322 lb (146.058 kg)  BMI 47.53 kg/m2 General: Alert, oriented x3, no distress  Head: no evidence of trauma, PERRL, EOMI, no exophtalmos or lid lag, no myxedema, no xanthelasma; normal ears, nose and oropharynx  Neck: normal jugular venous pulsations and no hepatojugular reflux; brisk carotid pulses without delay and no carotid bruits  Chest: clear to auscultation, no signs of consolidation by percussion or palpation, normal fremitus, symmetrical and full respiratory excursions  Cardiovascular: normal position and quality of the apical impulse, regular rhythm, normal first and second heart sounds, no murmurs, rubs or gallops  Abdomen: no tenderness or distention, no masses by palpation, no abnormal pulsatility or arterial bruits, normal bowel sounds, no hepatosplenomegaly  Extremities: no clubbing, cyanosis or edema; 2+ radial, ulnar and brachial pulses bilaterally; 2+ right femoral, posterior tibial and dorsalis pedis pulses; 2+ left femoral, posterior tibial and dorsalis pedis pulses; no subclavian or femoral bruits  Neurological: grossly nonfocal   BMET    Component Value Date/Time   NA 138 09/13/2012 0600   K 4.4 09/13/2012 0600   CL 103 09/13/2012 0600   CO2 24 09/13/2012 0600   GLUCOSE 112* 09/13/2012 0600   BUN 12 09/13/2012 0600   CREATININE 0.74 09/13/2012 0600   CALCIUM 9.3 09/13/2012 0600   GFRNONAA >90 09/13/2012 0600   GFRAA >90 09/13/2012 0600     ASSESSMENT AND PLAN Atrial flutter with rapid ventricular response  Now that his hypertension is well treated and his obstructive sleep apnea will  soon be well treated, we can hope that there will be a lower likelihood of arrhythmia recurrence. He has a low risk of cardioembolic events and will take aspirin for stroke prevention. If his arrhythmia recurs, rather than try potentially risky antiarrhythmic agents and commit him to long-term anticoagulation, I would prefer to refer him for early cavotricuspid isthmus ablation.  Cardiomyopathy- etiol uindetermined- EF reportedly 35-40% This has resolved and was clearly tachycardia related cardiomyopathy. Future arrhythmia events should be treated promptly with aggressive rate control and early cardioversion.   Patient Instructions  Your physician recommends that you schedule a follow-up appointment in: 12 months     Leigh Blas  Thurmon Fair, MD, [facility] [phone] office [phone] pager

## 2013-01-22 NOTE — Assessment & Plan Note (Addendum)
This has resolved and was clearly tachycardia related cardiomyopathy. Future arrhythmia events should be treated promptly with aggressive rate control and early cardioversion.

## 2013-01-26 ENCOUNTER — Telehealth: Payer: Self-pay | Admitting: Cardiovascular Disease

## 2013-01-26 NOTE — Telephone Encounter (Signed)
His Lasix was increased and it was suppose to have been called in to the pharmacy.Please call in new prescription for new doseage and 90 days supply to [phone]

## 2013-01-26 NOTE — Telephone Encounter (Signed)
Called pharmacy to verify electronic refill had gone thru 12/04/2012. Will go ahead and fill script

## 2013-04-20 ENCOUNTER — Telehealth: Payer: Self-pay | Admitting: Cardiovascular Disease

## 2013-04-20 NOTE — Telephone Encounter (Signed)
Stating that he has never seen the doctor about the c-pap. Has had the sleep study several months ago , and has not spoken with anyone about the results .Marland Kitchen Would like to speak to someone about that . Please call    Thanks

## 2013-04-20 NOTE — Telephone Encounter (Signed)
Returned call and pt verified x 2 w/ pt's wife, Malik Edwards.  Stated she is trying to schedule an appt w/ Dr. Tresa Endo b/c pt had a sleep study.  Stated pt was instructed to go to [facility] and get the CPAP.  Stated pt wore the CPAP for the amount of days and was told to call for an appt w/ Dr. Tresa Endo.  Wife informed pt can schedule an appt in the [facility] w/ Dr. Tresa Endo.  Wife stated the next appt is in May and she doesn't think pt should have to wait until then to get the results.  Wife informed Dr. Tresa Endo is the only doctor in our practice that works the Sleep [facility] and it is usually monthly.  Wife asked about April and informed it is likely full if she was offered an appt in May.  Wife advised to schedule the next available appt and pt may be able to be put on a wait list if there is a cancellation.  Verbalized understanding and transferred back to Scheduling.

## 2013-05-06 ENCOUNTER — Ambulatory Visit: Payer: BC Managed Care – PPO | Admitting: Emergency Medicine

## 2013-05-06 VITALS — BP 110/90 | HR 76 | Temp 98.7°F | Resp 18 | Ht 69.0 in | Wt 302.0 lb

## 2013-05-06 DIAGNOSIS — G5620 Lesion of ulnar nerve, unspecified upper limb: Secondary | ICD-10-CM

## 2013-05-06 DIAGNOSIS — M20029 Boutonniere deformity of unspecified finger(s): Secondary | ICD-10-CM

## 2013-05-06 DIAGNOSIS — J209 Acute bronchitis, unspecified: Secondary | ICD-10-CM

## 2013-05-06 DIAGNOSIS — M5412 Radiculopathy, cervical region: Secondary | ICD-10-CM

## 2013-05-06 DIAGNOSIS — G5600 Carpal tunnel syndrome, unspecified upper limb: Secondary | ICD-10-CM

## 2013-05-06 MED ORDER — NAPROXEN SODIUM 550 MG PO TABS: 550.0000 mg | ORAL_TABLET | Freq: Two times a day (BID) | ORAL | Status: DC

## 2013-05-06 MED ORDER — ALBUTEROL SULFATE HFA 108 (90 BASE) MCG/ACT IN AERS: 2.0000 | INHALATION_SPRAY | RESPIRATORY_TRACT | Status: DC | PRN

## 2013-05-06 NOTE — Progress Notes (Signed)
[facility] [address], [place] [address] [phone]  Date:  05/06/2013   Name:  Malik Edwards   DOB:  [birth year]   MRN:  [id number]  PCP:  Tonye PearsonOLITTLE, ROBERT P, MD    Chief Complaint: Hand Pain   History of Present Illness:  Malik Reahomas W Stammer is a 38 y.o. very pleasant male patient who presents with the following:  numerous complaints. Has numbness and weakness in left hand. Particularly ulnar distribution. Forced to take off his watch due to pain in the wrist in the location of the ulnar nerve.  Describes weakness in working with tools and small parts involving the left median distribution as well. Says he has night pain and numbness in median distribution.   No history of trauma.  Has resting tremor at times in left hand worse than left.  All present in past month or six weeks. Has nasal congestion with clear mucous, some post nasal drainage.  No fever or chills. Cough at times with mucoid sputum.  Some wheezing and exertional shortness of breath.  No history of allergies.   Overweight smoker with cardiomyopathy and hypertension. No improvement with over the counter medications or other home remedies. Denies other complaint or health concern today.   Patient Active Problem List   Diagnosis Date Noted  . OSA (obstructive sleep apnea) 01/22/2013  . Cellulitis of left lower leg 10/15/2012  . Edema of both legs 10/15/2012  . Tobacco use 10/15/2012  . Atrial flutter with rapid ventricular response 09/13/2012  . Cardiomyopathy- etiol uindetermined- EF reportedly 35-40% 09/13/2012  . HTN (hypertension) 09/13/2012  . Morbid obesity with BMI of 45.0-49.9, adult 09/13/2012  . Transaminitis- ?fatty liver, worse when statin added 09/13/2012    Past Medical History  Diagnosis Date  . Hypertension   . Cardiomyopathy   . Colitis   . Depression   . OSA (obstructive sleep apnea) 01/22/2013    Past Surgical History  Procedure Laterality Date  . Cardioversion  N/A 09/15/2012    Procedure: CARDIOVERSION;  Surgeon: Chrystie NoseKenneth C. Hilty, MD;  [facility];  Service: Cardiovascular;  Laterality: N/A;    History  Substance Use Topics  . Smoking status: Current Every Day Smoker -- 0.50 packs/day for 15 years    Types: Cigarettes  . Smokeless tobacco: Never Used  . Alcohol Use: 6.0 oz/week    12 drink(s) per week     Comment: pt and wife agree he is a social drinker, no hx of addiction/withdrawal    Family History  Problem Relation Age of Onset  . Healthy Mother   . Healthy Father     Allergies  Allergen Reactions  . Statins     Elevated LFTs    Medication list has been reviewed and updated.  Current Outpatient Prescriptions on File Prior to Visit  Medication Sig Dispense Refill  . amLODipine (NORVASC) 5 MG tablet Take 1 tablet (5 mg total) by mouth daily.  180 tablet  3  . carvedilol (COREG) 25 MG tablet Take 25 mg by mouth 2 (two) times daily with a meal.      . cholecalciferol (VITAMIN D) 1000 UNITS tablet Take 1,000 Units by mouth daily.      Marland Kitchen. doxepin (SINEQUAN) 25 MG capsule Take 25 mg by mouth as needed.      . furosemide (LASIX) 40 MG tablet Take 2 tablets (80 mg total) by mouth daily.  180 tablet  3  . lisinopril (PRINIVIL,ZESTRIL) 10 MG  tablet Take 10 mg by mouth daily.      Marland Kitchen loperamide (IMODIUM) 2 MG capsule Take 4 mg by mouth 2 (two) times daily.      . Multiple Vitamin (MULTIVITAMIN) tablet Take 1 tablet by mouth daily.      . potassium chloride SA (K-DUR,KLOR-CON) 20 MEQ tablet Take 1 tablet (20 mEq total) by mouth daily.  90 tablet  3  . sertraline (ZOLOFT) 100 MG tablet Take 150 mg by mouth at bedtime.      . trihexyphenidyl (ARTANE) 2 MG tablet Take 2 mg by mouth 2 (two) times daily.      . ziprasidone (GEODON) 60 MG capsule Take 180 mg by mouth every evening.        No current facility-administered medications on file prior to visit.    Review of Systems:  As per HPI, otherwise negative.    Physical  Examination: Filed Vitals:   05/06/13 1734  BP: 110/90  Pulse: 76  Temp: 98.7 F (37.1 C)  Resp: 18   Filed Vitals:   05/06/13 1734  Height: 5\' 9"  (1.753 m)  Weight: 302 lb (136.986 kg)   Body mass index is 44.58 kg/(m^2). Ideal Body Weight: Weight in (lb) to have BMI = 25: 168.9  GEN: WDWN, NAD, Non-toxic, A & O x 3 HEENT: Atraumatic, Normocephalic. Neck supple. No masses, No LAD. Ears and Nose: No external deformity. CV: RRR, No M/G/R. No JVD. No thrill. No extra heart sounds. PULM: CTA B, diffuse wheezes, no crackles, rhonchi. No retractions. No resp. distress. No accessory muscle use. ABD: S, NT, ND, +BS. No rebound. No HSM. EXTR: No c/c/e NEURO Normal gait.  PSYCH: Normally interactive. Conversant. Not depressed or anxious appearing.  Calm demeanor.  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

## 2013-05-06 NOTE — Patient Instructions (Addendum)
Carpal Tunnel Syndrome The carpal tunnel is a narrow area located on the palm side of your wrist. The tunnel is formed by the wrist bones and ligaments. Nerves, blood vessels, and tendons pass through the carpal tunnel. Repeated wrist motion or certain diseases may cause swelling within the tunnel. This swelling pinches the main nerve in the wrist (median nerve) and causes the painful hand and arm condition called carpal tunnel syndrome. CAUSES   Repeated wrist motions.  Wrist injuries.  Certain diseases like arthritis, diabetes, alcoholism, hyperthyroidism, and kidney failure.  Obesity.  Pregnancy. SYMPTOMS   A "pins and needles" feeling in your fingers or hand.  Tingling or numbness in your fingers or hand.  An aching feeling in your entire arm.  Wrist pain that goes up your arm to your shoulder.  Pain that goes down into your palm or fingers.  A weak feeling in your hands. DIAGNOSIS  Your caregiver will take your history and perform a physical exam. An electromyography test may be needed. This test measures electrical signals sent out by the muscles. The electrical signals are usually slowed by carpal tunnel syndrome. You may also need X-rays. TREATMENT  Carpal tunnel syndrome may clear up by itself. Your caregiver may recommend a wrist splint or medicine such as a nonsteroidal anti-inflammatory medicine. Cortisone injections may help. Sometimes, surgery may be needed to free the pinched nerve.  HOME CARE INSTRUCTIONS   Take all medicine as directed by your caregiver. Only take over-the-counter or prescription medicines for pain, discomfort, or fever as directed by your caregiver.  If you were given a splint to keep your wrist from bending, wear it as directed. It is important to wear the splint at night. Wear the splint for as long as you have pain or numbness in your hand, arm, or wrist. This may take 1 to 2 months.  Rest your wrist from any activity that may be causing your  pain. If your symptoms are work-related, you may need to talk to your employer about changing to a job that does not require using your wrist.  Put ice on your wrist after long periods of wrist activity.  Put ice in a plastic bag.  Place a towel between your skin and the bag.  Leave the ice on for 15-20 minutes, 03-04 times a day.  Keep all follow-up visits as directed by your caregiver. This includes any orthopedic referrals, physical therapy, and rehabilitation. Any delay in getting necessary care could result in a delay or failure of your condition to heal. SEEK IMMEDIATE MEDICAL CARE IF:   You have new, unexplained symptoms.  Your symptoms get worse and are not helped or controlled with medicines. MAKE SURE YOU:   Understand these instructions.  Will watch your condition.  Will get help right away if you are not doing well or get worse. Document Released: 01/13/2000 Document Revised: 04/09/2011 Document Reviewed: 12/01/2010 [facility] Patient Information 2014 [place], [place]. Metered Dose Inhaler (No Spacer Used) Inhaled medicines are the basis of asthma treatment and other breathing problems. Inhaled medicine can only be effective if used properly. Good technique assures that the medicine reaches the lungs. Metered dose inhalers (MDIs) are used to deliver a variety of inhaled medicines. These include quick relief or rescue medicines (such as bronchodilators) and controller medicines (such as corticosteroids). The medicine is delivered by pushing down on a metal canister to release a set amount of spray. If you are using different kinds of inhalers, use your quick relief medicine  to open the airways 10 15 minutes before using a steroid if instructed to do so by your health care provider. If you are unsure which inhalers to use and the order of using them, ask your health care provider, nurse, or respiratory therapist. HOW TO USE THE INHALER 1. Remove cap from inhaler. 2. If you are  using the inhaler for the first time, you will need to prime it. Shake the inhaler for 5 seconds and release four puffs into the air, away from your face. Ask your health care provider or pharmacist if you have questions about priming your inhaler. 3. Shake inhaler for 5 seconds before each breath in (inhalation). 4. Position the inhaler so that the top of the canister faces up. 5. Put your index finger on the top of the medicine canister. Your thumb supports the bottom of the inhaler. 6. Open your mouth. 7. Either place the inhaler between your teeth and place your lips tightly around the mouthpiece, or hold the inhaler 1 2 inches away from your open mouth. If you are unsure of which technique to use, ask your health care provider. 8. Breathe out (exhale) normally and as completely as possible. 9. Press the canister down with the index finger to release the medicine. 10. At the same time as the canister is pressed, inhale deeply and slowly until the lungs are completely filled. This should take 4 6 seconds. Keep your tongue down. 11. Hold the medicine in your lungs for up to 5 10 seconds (10 seconds is best). This helps the medicine get into the small airways of your lungs. 12. Breathe out slowly, through pursed lips. Whistling is an example of pursed lips. 13. Wait at least 1 minute between puffs. Continue with the above steps until you have taken the number of puffs your health care provider has ordered. Do not use the inhaler more than your health care provider directs you to. 14. Replace cap on inhaler. 15. Follow the directions from your health care provider or the inhaler insert for cleaning the inhaler. If you are using a steroid inhaler, rinse your mouth with water after your last puff, gargle, and spit out the water. Do not swallow the water. AVOID:  Inhaling before or after starting the spray of medicine. It takes practice to coordinate your breathing with triggering the  spray.  Inhaling through the nose (rather than the mouth) when triggering the spray. HOW TO DETERMINE IF YOUR INHALER IS FULL OR NEARLY EMPTY You cannot know when an inhaler is empty by shaking it. A few inhalers are now being made with dose counters. Ask your health care provider for a prescription that has a dose counter if you feel you need that extra help. If your inhaler does not have a counter, ask your health care provider to help you determine the date you need to refill your inhaler. Write the refill date on a calendar or your inhaler canister. Refill your inhaler 7 10 days before it runs out. Be sure to keep an adequate supply of medicine. This includes making sure it is not expired, and you have a spare inhaler.  SEEK MEDICAL CARE IF:   Symptoms are only partially relieved with your inhaler.  You are having trouble using your inhaler.  You experience some increase in phlegm. SEEK IMMEDIATE MEDICAL CARE IF:   You feel little or no relief with your inhalers. You are still wheezing and are feeling shortness of breath or tightness in your chest or  both.  You have dizziness, headaches, or fast heart rate.  You have chills, fever, or night sweats.  There is a noticeable increase in phlegm production, or there is blood in the phlegm. Document Released: 11/12/2006 Document Revised: 09/17/2012 Document Reviewed: 07/03/2012 [facility] Patient Information 2014 [place].

## 2013-05-18 ENCOUNTER — Encounter: Payer: Self-pay | Admitting: Diagnostic Neuroimaging

## 2013-05-18 ENCOUNTER — Ambulatory Visit (INDEPENDENT_AMBULATORY_CARE_PROVIDER_SITE_OTHER): Payer: BC Managed Care – PPO | Admitting: Diagnostic Neuroimaging

## 2013-05-18 VITALS — BP 133/83 | HR 82 | Ht 69.0 in | Wt 311.0 lb

## 2013-05-18 DIAGNOSIS — M79642 Pain in left hand: Secondary | ICD-10-CM

## 2013-05-18 DIAGNOSIS — R209 Unspecified disturbances of skin sensation: Secondary | ICD-10-CM

## 2013-05-18 DIAGNOSIS — M79609 Pain in unspecified limb: Secondary | ICD-10-CM

## 2013-05-18 DIAGNOSIS — M79641 Pain in right hand: Secondary | ICD-10-CM

## 2013-05-18 DIAGNOSIS — G5620 Lesion of ulnar nerve, unspecified upper limb: Secondary | ICD-10-CM

## 2013-05-18 DIAGNOSIS — R200 Anesthesia of skin: Secondary | ICD-10-CM

## 2013-05-18 NOTE — Progress Notes (Signed)
[facility]  PATIENT: Malik Edwards DOB: [birth year]  REFERRING CLINICIAN: Noreene Filbert HISTORY FROM: patient and wife REASON FOR VISIT: new consult   HISTORICAL  CHIEF COMPLAINT:  Chief Complaint  Patient presents with  . Pain    L hand  . Numbness    L & R hand    HISTORY OF PRESENT ILLNESS:   38 year old right-handed male with history of hypertension, hypercholesterolemia, heart disease, schizoaffective disorder, here for evaluation of bilateral hand numbness.  For past one to 2 months patient has had numbness in his bilateral hand, fourth and fifth digits, left worse than right. He is dropping things with his left hand. He has pain in his left wrist. No symptoms proximal to the wrist. No elbow pains. No neck pain. No balance or walking difficulties. No problems with his feet or legs.  REVIEW OF SYSTEMS: Full 14 system review of systems performed and notable only for 100 pound weight gain since 2009.  ALLERGIES: Allergies  Allergen Reactions  . Statins     Elevated LFTs    HOME MEDICATIONS: Outpatient Prescriptions Prior to Visit  Medication Sig Dispense Refill  . albuterol (PROVENTIL HFA;VENTOLIN HFA) 108 (90 BASE) MCG/ACT inhaler Inhale 2 puffs into the lungs every 4 (four) hours as needed for wheezing or shortness of breath (cough, shortness of breath or wheezing.).  1 Inhaler  1  . amLODipine (NORVASC) 5 MG tablet Take 1 tablet (5 mg total) by mouth daily.  180 tablet  3  . carvedilol (COREG) 25 MG tablet Take 25 mg by mouth 2 (two) times daily with a meal.      . cholecalciferol (VITAMIN D) 1000 UNITS tablet Take 1,000 Units by mouth daily.      Marland Kitchen doxepin (SINEQUAN) 25 MG capsule Take 25 mg by mouth as needed.      . furosemide (LASIX) 40 MG tablet Take 2 tablets (80 mg total) by mouth daily.  180 tablet  3  . lisinopril (PRINIVIL,ZESTRIL) 10 MG tablet Take 10 mg by mouth daily.      Marland Kitchen loperamide (IMODIUM) 2 MG capsule Take 4 mg by mouth 2  (two) times daily.      . Multiple Vitamin (MULTIVITAMIN) tablet Take 1 tablet by mouth daily.      . naproxen sodium (ANAPROX DS) 550 MG tablet Take 1 tablet (550 mg total) by mouth 2 (two) times daily with a meal.  40 tablet  0  . potassium chloride SA (K-DUR,KLOR-CON) 20 MEQ tablet Take 1 tablet (20 mEq total) by mouth daily.  90 tablet  3  . sertraline (ZOLOFT) 100 MG tablet Take 100 mg by mouth at bedtime.       . trihexyphenidyl (ARTANE) 2 MG tablet Take 2 mg by mouth 2 (two) times daily.      . ziprasidone (GEODON) 60 MG capsule Take 180 mg by mouth every evening.        No facility-administered medications prior to visit.    PAST MEDICAL HISTORY: Past Medical History  Diagnosis Date  . Hypertension   . Cardiomyopathy   . Colitis   . Depression   . OSA (obstructive sleep apnea) 01/22/2013  . High cholesterol     PAST SURGICAL HISTORY: Past Surgical History  Procedure Laterality Date  . Cardioversion N/A 09/15/2012    Procedure: CARDIOVERSION;  Surgeon: Chrystie Nose, MD;  Location: [facility] OR;  Service: Cardiovascular;  Laterality: N/A;    FAMILY HISTORY: Family History  Problem Relation  Age of Onset  . Healthy Mother   . Healthy Father     SOCIAL HISTORY:  History   Social History  . Marital Status: Married    Spouse Name: Dalmer Dolinski    Number of Children: 0  . Years of Education: Assoc. Deg   Occupational History  .  Other    [employer]   Social History Main Topics  . Smoking status: Current Every Day Smoker -- 1.00 packs/day for 15 years    Types: Cigarettes  . Smokeless tobacco: Never Used  . Alcohol Use: 6.0 oz/week    12 drink(s) per week     Comment: pt and wife agree he is a social drinker, no hx of addiction/withdrawal  . Drug Use: No  . Sexual Activity: Yes    Birth Control/ Protection: None   Other Topics Concern  . Not on file   Social History Narrative   Patient lives at home with spouse.   Caffeine Use: 1-2 cups daily      PHYSICAL EXAM And Filed Vitals:   05/18/13 0859  BP: 133/83  Pulse: 82  Height: 5\' 9"  (1.753 m)  Weight: 311 lb (141.069 kg)    Not recorded    Body mass index is 45.91 kg/(m^2).  GENERAL EXAM: Patient is in no distress; well developed, nourished and groomed; neck is supple  CARDIOVASCULAR: Regular rate and rhythm, no murmurs, no carotid bruits  NEUROLOGIC: MENTAL STATUS: awake, alert, oriented to person, place and time, recent and remote memory intact, normal attention and concentration, language fluent, comprehension intact, naming intact, fund of knowledge appropriate CRANIAL NERVE: no papilledema on fundoscopic exam, pupils equal and reactive to light, visual fields full to confrontation, extraocular muscles intact, no nystagmus, facial sensation and strength symmetric, hearing intact, palate elevates symmetrically, uvula midline, shoulder shrug symmetric, tongue midline. MOTOR: normal bulk and tone, full strength in the BLE; RUE (FINGER ABDUCTION 4), LUE (FINGER ABDUCTION 2-3, 4TH AND 5TH EXT AT DIP/PIP 3). ULNAR CLAW DEFORMITY IN LEFT HAND. SENSORY: DECR PP IN BUE (4TH, 5TH DIGITS). VIB 6 SEC AT TOES. COORDINATION: finger-nose-finger, fine finger movements normal REFLEXES: deep tendon reflexes present and symmetricL BUE 2, KNEES 2, ANKLES 1 GAIT/STATION: narrow based gait; romberg is negative    DIAGNOSTIC DATA (LABS, IMAGING, TESTING) - I reviewed patient records, labs, notes, testing and imaging myself where available.  Lab Results  Component Value Date   WBC 9.6 09/13/2012   HGB 14.9 09/13/2012   HCT 43.8 09/13/2012   MCV 94.6 09/13/2012   PLT 272 09/13/2012      Component Value Date/Time   NA 138 09/13/2012 0600   K 4.4 09/13/2012 0600   CL 103 09/13/2012 0600   CO2 24 09/13/2012 0600   GLUCOSE 112* 09/13/2012 0600   BUN 12 09/13/2012 0600   CREATININE 0.74 09/13/2012 0600   CALCIUM 9.3 09/13/2012 0600   PROT 6.6 09/13/2012 0022   ALBUMIN 3.2* 09/13/2012 0022    AST 38* 09/13/2012 0022   ALT 51 09/13/2012 0022   ALKPHOS 88 09/13/2012 0022   BILITOT 0.2* 09/13/2012 0022   GFRNONAA >90 09/13/2012 0600   GFRAA >90 09/13/2012 0600   No results found for this basename: CHOL, HDL, LDLCALC, LDLDIRECT, TRIG, CHOLHDL   No results found for this basename: HGBA1C   No results found for this basename: VITAMINB12   Lab Results  Component Value Date   TSH 5.136* 09/13/2012      ASSESSMENT AND PLAN  38 y.o.  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 Will check EMG.NCS to eval for more proximal lesions.  Ddx: ulnar neuropathy (wrist vs elbow) vs cervical radiculopathy (C8-T1)  PLAN: - EMG/NCS - avoid compression at wrist and elbows  Orders Placed This Encounter  Procedures  . NCV with EMG(electromyography)   Return for emg/ncs (Friday).    Suanne MarkerVIKRAM R. Montrice Gracey, MD 05/18/2013, 9:43 AM Certified in Neurology, Neurophysiology and Neuroimaging  [facility] [address], [address] [place], [place] [address] [phone]

## 2013-05-18 NOTE — Patient Instructions (Signed)
I will check EMG/NCS. 

## 2013-05-29 ENCOUNTER — Ambulatory Visit (INDEPENDENT_AMBULATORY_CARE_PROVIDER_SITE_OTHER): Payer: BC Managed Care – PPO | Admitting: Neurology

## 2013-05-29 ENCOUNTER — Encounter (INDEPENDENT_AMBULATORY_CARE_PROVIDER_SITE_OTHER): Payer: Self-pay

## 2013-05-29 DIAGNOSIS — G5620 Lesion of ulnar nerve, unspecified upper limb: Secondary | ICD-10-CM

## 2013-05-29 DIAGNOSIS — M79642 Pain in left hand: Secondary | ICD-10-CM

## 2013-05-29 DIAGNOSIS — Z0289 Encounter for other administrative examinations: Secondary | ICD-10-CM

## 2013-05-29 DIAGNOSIS — R200 Anesthesia of skin: Secondary | ICD-10-CM

## 2013-05-29 DIAGNOSIS — M79641 Pain in right hand: Secondary | ICD-10-CM

## 2013-05-29 NOTE — Procedures (Signed)
HISTORY:  Malik Edwards is a 38 year old gentleman with a history of cardiomyopathy. The patient has had a two-month history of numbness in both hands, left greater right, with some weakness of the hands as well. He is being evaluated for a possible neuropathy or a possible cervical radiculopathy.  NERVE CONDUCTION STUDIES:  Nerve conduction studies were performed both upper extremities. The distal motor latencies and motor amplitudes for the median nerves were within normal limits bilaterally. The F wave latencies and nerve conduction velocities for the median nerves were normal bilaterally. The distal motor latencies and motor amplitudes for the ulnar nerves were normal bilaterally, but there was a drop off in the motor amplitudes across the elbow for the ulnar nerves bilaterally. The F wave latencies for the ulnar nerves were prolonged bilaterally, with normal nerve conduction velocities below the elbows, and slowed nerve conduction velocities above the elbows bilaterally. The sensory latencies for the median nerves were normal bilaterally, and are normal for the right ulnar nerve, prolonged for the left ulnar nerve.  EMG STUDIES: EMG study was performed on the right upper extremity:  The first dorsal interosseous muscle reveals 2 to 4 K units with slightly reduced recruitment. 2+ fibrillations and positive waves were noted. The abductor pollicis brevis muscle reveals 2 to 4 K units with full recruitment. No fibrillations or positive waves were noted. The extensor indicis proprius muscle reveals 1 to 3 K units with full recruitment. No fibrillations or positive waves were noted. The pronator teres muscle reveals 2 to 3 K units with full recruitment. No fibrillations or positive waves were noted. The flexor digitorum profundus muscle (III-IV) reveals 2 to 4 K units with slightly decreased recruitment. No fibrillations or positive waves were noted. The biceps muscle reveals 1 to 2 K units  with full recruitment. No fibrillations or positive waves were noted. The triceps muscle reveals 2 to 4 K units with full recruitment. No fibrillations or positive waves were noted. The anterior deltoid muscle reveals 2 to 3 K units with full recruitment. No fibrillations or positive waves were noted. The cervical paraspinal muscles were tested at 2 levels. No abnormalities of insertional activity were seen at either level tested. There was good relaxation.  EMG study was performed on the left upper extremity:  The first dorsal interosseous muscle reveals 2 to 5 K units with decreased recruitment. 2+ fibrillations and positive waves were noted. The abductor pollicis brevis muscle reveals 2 to 4 K units with full recruitment. No fibrillations or positive waves were noted. The extensor indicis proprius muscle reveals 1 to 3 K units with full recruitment. No fibrillations or positive waves were noted. The pronator teres muscle reveals 2 to 3 K units with full recruitment. No fibrillations or positive waves were noted. The flexor digitorum profundus muscle reveals 2 to 5 K units with decreased recruitment. 2+ fibrillations and positive waves were noted. The biceps muscle reveals 1 to 2 K units with full recruitment. No fibrillations or positive waves were noted. The triceps muscle reveals 2 to 4 K units with full recruitment. No fibrillations or positive waves were noted. The anterior deltoid muscle reveals 2 to 3 K units with full recruitment. No fibrillations or positive waves were noted. The cervical paraspinal muscles were tested at 2 levels. No abnormalities of insertional activity were seen at either level tested. There was good relaxation.   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 EMG evaluations of both upper extremities also show findings consistent with bilateral  tardy ulnar neuropathies, left greater than right. There is no evidence of a cervical radiculopathy on either side.  Marlan Palau. Keith Willis MD 05/29/2013 2:45 PM  [facility] [address] [place], [place] [id number]  Phone [phone] Fax [phone]

## 2013-06-08 ENCOUNTER — Telehealth: Payer: Self-pay | Admitting: Cardiovascular Disease

## 2013-06-08 NOTE — Telephone Encounter (Signed)
Per answering service from Saturday: Pt have not heard from his sleep study results.Also he never had an appt with Dr Tresa Endo.

## 2013-06-08 NOTE — Telephone Encounter (Signed)
Returned a call and spoke with patient. Appointment given to see Dr. Tresa Endo this Wednesday to discuss his sleep study.

## 2013-06-08 NOTE — Telephone Encounter (Signed)
New Prob    Requesting a call from Dr. Landry Dyke assistant regarding sleep study. Please call.

## 2013-06-10 ENCOUNTER — Encounter: Payer: Self-pay | Admitting: Cardiovascular Disease

## 2013-06-10 ENCOUNTER — Ambulatory Visit (INDEPENDENT_AMBULATORY_CARE_PROVIDER_SITE_OTHER): Payer: BC Managed Care – PPO | Admitting: Cardiovascular Disease

## 2013-06-10 VITALS — BP 127/76 | HR 80 | Ht 69.5 in | Wt 305.4 lb

## 2013-06-10 DIAGNOSIS — I10 Essential (primary) hypertension: Secondary | ICD-10-CM

## 2013-06-10 DIAGNOSIS — I4892 Unspecified atrial flutter: Secondary | ICD-10-CM

## 2013-06-10 DIAGNOSIS — Z6841 Body Mass Index (BMI) 40.0 and over, adult: Secondary | ICD-10-CM

## 2013-06-10 DIAGNOSIS — I429 Cardiomyopathy, unspecified: Secondary | ICD-10-CM

## 2013-06-10 DIAGNOSIS — G4733 Obstructive sleep apnea (adult) (pediatric): Secondary | ICD-10-CM

## 2013-06-10 DIAGNOSIS — I428 Other cardiomyopathies: Secondary | ICD-10-CM

## 2013-06-10 NOTE — Progress Notes (Signed)
Patient ID: INFANTOF BOZZI, male   DOB: 1975/04/20, 38 y.o.   MRN: [id number]     HPI: Malik Edwards is a 38 y.o. male who is followed primarily by Dr. Royann Shivers.  The patient presents to my office in followup of initiation of CPAP therapy for his obstructive sleep apnea.  Malik Edwards has a history of morbid obesity, hypertension, atrial flutter with tachycardia related cardiomyopathy , and obstructive sleep apnea.  He is referred for a diagnostic polysomnogram, which was done on 11/20/2012.  This confirmed moderate sleep apnea with an HI at 15 per hour but REM sleep.  This was very severe and 70.6 per hour.  He has significant oxygen desaturation to 75% with non-REM sleep and down to 64% with REM sleep.  He subsequently underwent a CPAP titration in a CPAP although unit with a flexible 3 at a minimum pressure of 12 a maximum of 20 cm water pressure was recommended.  He has been on CPAP therapy now for several months.  He does note improved energy.  His sleep pattern is better.  A download was obtained from 02/07/2013 through 03/30/2013.  He is meeting compliance standards, but apparently has only been averaging 5 hours and 50 minutes per night.  His maximum pressure is 13 cm and is 93% pressure is 12.6 cm , H., I., 0.8.  On further questioning, although he does have more energy, he still has some residual mild sleepiness.  He typically goes to bed approximately 10 PM and has to wake up at 4:30 AM for work.  Often times wakes up at 2:00 in the morning and on most instances does not put back on his CPAP mask.   Epworth Sleepiness Scale: Situation   Chance of Dozing/Sleeping (0 = never , 1 = slight chance , 2 = moderate chance , 3 = high chance )   sitting and reading 3   watching TV 3   sitting inactive in a public place 0   being a passenger in a motor vehicle for an hour or more 3   lying down in the afternoon 3   sitting and talking to someone 0   sitting quietly after lunch (no alcohol) 2   while stopped for a few minutes in traffic as the driver 0   Total Score  14    Past Medical History  Diagnosis Date  . Hypertension   . Cardiomyopathy   . Colitis   . Depression   . OSA (obstructive sleep apnea) 01/22/2013  . High cholesterol     Past Surgical History  Procedure Laterality Date  . Cardioversion N/A 09/15/2012    Procedure: CARDIOVERSION;  Surgeon: Chrystie Nose, MD;  Location: [facility] OR;  Service: Cardiovascular;  Laterality: N/A;    Allergies  Allergen Reactions  . Statins     Elevated LFTs    Current Outpatient Prescriptions  Medication Sig Dispense Refill  . albuterol (PROVENTIL HFA;VENTOLIN HFA) 108 (90 BASE) MCG/ACT inhaler Inhale 2 puffs into the lungs every 4 (four) hours as needed for wheezing or shortness of breath (cough, shortness of breath or wheezing.).  1 Inhaler  1  . amLODipine (NORVASC) 5 MG tablet Take 1 tablet (5 mg total) by mouth daily.  180 tablet  3  . carvedilol (COREG) 25 MG tablet Take 25 mg by mouth 2 (two) times daily with a meal.      . cholecalciferol (VITAMIN D) 1000 UNITS tablet Take 1,000 Units by mouth daily.      Marland Kitchen  doxepin (SINEQUAN) 25 MG capsule Take 25 mg by mouth as needed.      . furosemide (LASIX) 40 MG tablet Take 2 tablets (80 mg total) by mouth daily.  180 tablet  3  . lisinopril (PRINIVIL,ZESTRIL) 10 MG tablet Take 10 mg by mouth daily.      Marland Kitchen. loperamide (IMODIUM) 2 MG capsule Take 4 mg by mouth 2 (two) times daily.      . Multiple Vitamin (MULTIVITAMIN) tablet Take 1 tablet by mouth daily.      . potassium chloride SA (K-DUR,KLOR-CON) 20 MEQ tablet Take 1 tablet (20 mEq total) by mouth daily.  90 tablet  3  . sertraline (ZOLOFT) 100 MG tablet 100 mg. Patient takes 0.5 tablet daily.      . trihexyphenidyl (ARTANE) 2 MG tablet Take 2 mg by mouth 2 (two) times daily.      . ziprasidone (GEODON) 60 MG capsule Take 180 mg by mouth every evening.        No current facility-administered medications for this visit.     History   Social History  . Marital Status: Married    Spouse Name: Jolyn NapKatrina Marucci    Number of Children: 0  . Years of Education: Assoc. Deg   Occupational History  .  Other    [employer]   Social History Main Topics  . Smoking status: Current Every Day Smoker -- 1.00 packs/day for 15 years    Types: Cigarettes  . Smokeless tobacco: Never Used  . Alcohol Use: 6.0 oz/week    12 drink(s) per week     Comment: pt and wife agree he is a social drinker, no hx of addiction/withdrawal  . Drug Use: No  . Sexual Activity: Yes    Birth Control/ Protection: None   Other Topics Concern  . Not on file   Social History Narrative   Patient lives at home with spouse.   Caffeine Use: 1-2 cups daily     ROS General: Negative; No fevers, chills, or night sweats; positive long-standing obesity HEENT: Negative; No changes in vision or hearing, sinus congestion, difficulty swallowing Pulmonary: Negative; No cough, wheezing, shortness of breath, hemoptysis Cardiovascular: Positive for hypertension, and history of atrial flutter maintaining sinus rhythm; No chest pain, presyncope, syncope,  GI: Negative; No nausea, vomiting, diarrhea, or abdominal pain GU: Negative; No dysuria, hematuria, or difficulty voiding Musculoskeletal: Negative; no myalgias, joint pain, or weakness Hematologic: Negative; no easy bruising, bleeding Endocrine: Negative; no heat/cold intolerance Neuro: Negative; no changes in balance, headaches Skin: Negative; No rashes or skin lesions Psychiatric: Negative; No behavioral problems, depression Sleep: See history of present illness; no bruxism, restless legs, hypnogognic hallucinations, no cataplexy   Physical Exam BP 127/76  Pulse 80  Ht 5' 9.5" (1.765 m)  Wt 305 lb 6.4 oz (138.529 kg)  BMI 44.47 kg/m2  General: Alert, oriented, no distress.  Skin: normal turgor, no rashes HEENT: Normocephalic, atraumatic. Pupils round and reactive; sclera anicteric;  extraocular muscles intact; Fundi no hemorrhages or exudates Nose without nasal septal hypertrophy Mouth/Parynx benign; Mallinpatti scale 3/4 Neck: No JVD, no carotid briuts Lungs: clear to ausculatation and percussion; no wheezing or rales  Chest wall: No tenderness to palpation Heart: RRR, s1 s2 normal .  No S3, gallop.  No murmurs, rubs, thrills or heaves. Abdomen: Morbidly obese, with marked central adiposity;soft, nontender; no hepatosplenomehaly, BS+; abdominal aorta nontender and not dilated by palpation. Back: No CVA tenderness Pulses 2+ Extremities: no clubbinbg cyanosis or edema, Homan's  sign negative  Neurologic: grossly nonfocal; cranial nerves intact. Psychological: Normal affect and mood.    LABS:  BMET    Component Value Date/Time   NA 138 09/13/2012 0600   K 4.4 09/13/2012 0600   CL 103 09/13/2012 0600   CO2 24 09/13/2012 0600   GLUCOSE 112* 09/13/2012 0600   BUN 12 09/13/2012 0600   CREATININE 0.74 09/13/2012 0600   CALCIUM 9.3 09/13/2012 0600   GFRNONAA >90 09/13/2012 0600   GFRAA >90 09/13/2012 0600     Hepatic Function Panel     Component Value Date/Time   PROT 6.6 09/13/2012 0022   ALBUMIN 3.2* 09/13/2012 0022   AST 38* 09/13/2012 0022   ALT 51 09/13/2012 0022   ALKPHOS 88 09/13/2012 0022   BILITOT 0.2* 09/13/2012 0022   BILIDIR 0.2 03/25/2008 0620   IBILI 0.8 03/25/2008 0620     CBC    Component Value Date/Time   WBC 9.6 09/13/2012 0600   RBC 4.63 09/13/2012 0600   HGB 14.9 09/13/2012 0600   HCT 43.8 09/13/2012 0600   PLT 272 09/13/2012 0600   MCV 94.6 09/13/2012 0600   MCH 32.2 09/13/2012 0600   MCHC 34.0 09/13/2012 0600   RDW 14.1 09/13/2012 0600   LYMPHSABS 2.5 09/13/2012 0022   MONOABS 1.1* 09/13/2012 0022   EOSABS 0.4 09/13/2012 0022   BASOSABS 0.1 09/13/2012 0022     BNP    Component Value Date/Time   PROBNP 221.1* 09/12/2012 1620    Lipid Panel  No results found for this basename: chol, trig, hdl, cholhdl, vldl, ldlcalc     RADIOLOGY: No  results found.    ASSESSMENT AND PLAN: I long discussion with Malik. Kinslow and his wife.  I reviewed his diagnostic polysomnogram, as well as a CPAP titration trial.  He does have moderate sleep apnea overall, but this was very severe in REM sleep associated with significant oxygen desaturation to 64%.  He has felt markedly improved since initiating CPAP therapy.  He still has an increased Epworth Sleepiness Scale score.  A download of his CPAP unit demonstrates excellent benefit with an AHI of 0.8.  I suspect his residual daytime sleepiness is contributed by his inadequate sleep duration.  He is averaging only 5 hours a half per night.  We discussed the importance of 7-1/2-8 hours of sleep.  Particularly with his sleep apnea being very severe in rem sleep if he is not putting his mask back on when he returns from the bathroom at 2 AM he is not having this available when he is most likely to develop recurrent REM sleep.  We discussed the adverse consequences of untreated sleep apnea on his cardiovascular health.  He is now controlled with reference to his blood pressure.  We discussed at least a 100 pound weight loss to get under the obesity threshold.  He will return to the cardiologic care of Dr. Royann Shivers, and I'll see him back on an as-needed basis from a sleep perspective if problems arise.     Lennette Bihari, MD, [facility]  06/10/2013 4:14 PM

## 2013-06-10 NOTE — Patient Instructions (Signed)
Your physician recommends that you schedule a follow-up as needed for sleep. No changes were made today in your therapy.

## 2013-07-06 ENCOUNTER — Encounter (HOSPITAL_COMMUNITY): Payer: Self-pay | Admitting: Emergency Medicine

## 2013-07-06 ENCOUNTER — Ambulatory Visit (INDEPENDENT_AMBULATORY_CARE_PROVIDER_SITE_OTHER): Payer: BC Managed Care – PPO | Admitting: Internal Medicine

## 2013-07-06 ENCOUNTER — Emergency Department (HOSPITAL_COMMUNITY)
Admission: EM | Admit: 2013-07-06 | Discharge: 2013-07-06 | Disposition: A | Discharge status: Home / Self Care | Payer: BC Managed Care – PPO | Attending: Emergency Medicine | Admitting: Emergency Medicine

## 2013-07-06 VITALS — BP 156/94 | HR 79 | Temp 97.8°F | Resp 16 | Ht 68.5 in | Wt 305.0 lb

## 2013-07-06 DIAGNOSIS — S61412A Laceration without foreign body of left hand, initial encounter: Secondary | ICD-10-CM

## 2013-07-06 DIAGNOSIS — F329 Major depressive disorder, single episode, unspecified: Secondary | ICD-10-CM | POA: Insufficient documentation

## 2013-07-06 DIAGNOSIS — W298XXA Contact with other powered powered hand tools and household machinery, initial encounter: Secondary | ICD-10-CM | POA: Insufficient documentation

## 2013-07-06 DIAGNOSIS — F3289 Other specified depressive episodes: Secondary | ICD-10-CM | POA: Insufficient documentation

## 2013-07-06 DIAGNOSIS — Z8719 Personal history of other diseases of the digestive system: Secondary | ICD-10-CM | POA: Insufficient documentation

## 2013-07-06 DIAGNOSIS — Y9389 Activity, other specified: Secondary | ICD-10-CM | POA: Insufficient documentation

## 2013-07-06 DIAGNOSIS — I10 Essential (primary) hypertension: Secondary | ICD-10-CM | POA: Insufficient documentation

## 2013-07-06 DIAGNOSIS — Y92009 Unspecified place in unspecified non-institutional (private) residence as the place of occurrence of the external cause: Secondary | ICD-10-CM | POA: Insufficient documentation

## 2013-07-06 DIAGNOSIS — S61409A Unspecified open wound of unspecified hand, initial encounter: Secondary | ICD-10-CM

## 2013-07-06 DIAGNOSIS — Z7982 Long term (current) use of aspirin: Secondary | ICD-10-CM | POA: Insufficient documentation

## 2013-07-06 DIAGNOSIS — Z79899 Other long term (current) drug therapy: Secondary | ICD-10-CM | POA: Insufficient documentation

## 2013-07-06 DIAGNOSIS — Z8669 Personal history of other diseases of the nervous system and sense organs: Secondary | ICD-10-CM | POA: Insufficient documentation

## 2013-07-06 DIAGNOSIS — Z8639 Personal history of other endocrine, nutritional and metabolic disease: Secondary | ICD-10-CM | POA: Insufficient documentation

## 2013-07-06 DIAGNOSIS — F17200 Nicotine dependence, unspecified, uncomplicated: Secondary | ICD-10-CM | POA: Insufficient documentation

## 2013-07-06 DIAGNOSIS — Z862 Personal history of diseases of the blood and blood-forming organs and certain disorders involving the immune mechanism: Secondary | ICD-10-CM | POA: Insufficient documentation

## 2013-07-06 MED ORDER — HYDROCODONE-ACETAMINOPHEN 5-325 MG PO TABS: 1.0000 | ORAL_TABLET | ORAL | Status: DC | PRN

## 2013-07-06 NOTE — Discharge Instructions (Signed)
Take the prescribed medication as directed for pain control. Follow-up with hand surgery. Sutures will need to be removed in 1 week-- keep clean and dry until this time. Return to the ED for new or worsening symptoms-- redness, severe swelling, drainage, fevers, etc.

## 2013-07-06 NOTE — ED Notes (Signed)
Vital signs stable. 

## 2013-07-06 NOTE — ED Notes (Signed)
Misty Stanley, PA at bedside suturing patient.

## 2013-07-06 NOTE — Progress Notes (Signed)
   Subjective:    Patient ID: Malik Edwards, male    DOB: Jun 16, 1975, 38 y.o.   MRN: [id number]  HPI    Review of Systems     Objective:   Physical Exam        Assessment & Plan:

## 2013-07-06 NOTE — ED Provider Notes (Signed)
CSN: [id number]     Arrival date & time 07/06/13  1109 History   First MD Initiated Contact with Patient 07/06/13 1126     Chief Complaint  Patient presents with  . Hand Injury     (Consider location/radiation/quality/duration/timing/severity/associated sxs/prior Treatment) The history is provided by the patient and medical records.   This is a 38 year old male with past medical history significant for hypertension, cardiomyopathy, depression, sleep apnea, hypercholesterolemia, presented to the ED for left hand injury. Patient was working in his home shed with a power drill when he accidentally grazed the drill bit across the webspace in between his left arm and left index finger. He was seen at [facility] urgent care, recommended hand surgery consult and was told to come to the ED to see Dr. Janee Morn of [facility] orthopedic.  Patient denies any numbness or paresthesias of his left hand or fingers. He has full flexion and extension of all fingers. Patient is right-hand dominant. His tetanus is up-to-date.  Pt arrives with pressure dressing in place and arm in sling.  VS stable on arrival.  Past Medical History  Diagnosis Date  . Hypertension   . Cardiomyopathy   . Colitis   . Depression   . OSA (obstructive sleep apnea) 01/22/2013  . High cholesterol    Past Surgical History  Procedure Laterality Date  . Cardioversion N/A 09/15/2012    Procedure: CARDIOVERSION;  Surgeon: Chrystie Nose, MD;  Location: [facility] OR;  Service: Cardiovascular;  Laterality: N/A;   Family History  Problem Relation Age of Onset  . Healthy Mother   . Healthy Father    History  Substance Use Topics  . Smoking status: Current Every Day Smoker -- 1.00 packs/day for 15 years    Types: Cigarettes  . Smokeless tobacco: Never Used  . Alcohol Use: 6.0 oz/week    12 drink(s) per week     Comment: pt and wife agree he is a social drinker, no hx of addiction/withdrawal    Review of Systems  Skin: Positive for wound.   All other systems reviewed and are negative.     Allergies  Statins  Home Medications   Prior to Admission medications   Medication Sig Start Date End Date Taking? Authorizing Provider  albuterol (PROVENTIL HFA;VENTOLIN HFA) 108 (90 BASE) MCG/ACT inhaler Inhale 2 puffs into the lungs every 4 (four) hours as needed for wheezing or shortness of breath (cough, shortness of breath or wheezing.). 05/06/13   Phillips Odor, MD  amLODipine (NORVASC) 5 MG tablet Take 1 tablet (5 mg total) by mouth daily. 12/04/12   Mihai Croitoru, MD  aspirin 162 MG EC tablet Take 162 mg by mouth daily.    Historical Provider, MD  carvedilol (COREG) 25 MG tablet Take 25 mg by mouth 2 (two) times daily with a meal.    Historical Provider, MD  cholecalciferol (VITAMIN D) 1000 UNITS tablet Take 1,000 Units by mouth daily.    Historical Provider, MD  doxepin (SINEQUAN) 25 MG capsule Take 25 mg by mouth as needed. 11/07/12   Historical Provider, MD  furosemide (LASIX) 40 MG tablet Take 2 tablets (80 mg total) by mouth daily. 12/04/12   Mihai Croitoru, MD  lisinopril (PRINIVIL,ZESTRIL) 10 MG tablet Take 10 mg by mouth daily.    Historical Provider, MD  loperamide (IMODIUM) 2 MG capsule Take 4 mg by mouth 2 (two) times daily.    Historical Provider, MD  Multiple Vitamin (MULTIVITAMIN) tablet Take 1 tablet by mouth daily.  Historical Provider, MD  potassium chloride SA (K-DUR,KLOR-CON) 20 MEQ tablet Take 1 tablet (20 mEq total) by mouth daily. 12/04/12   Mihai Croitoru, MD  sertraline (ZOLOFT) 100 MG tablet 100 mg. Patient takes 0.5 tablet daily.    Historical Provider, MD  trihexyphenidyl (ARTANE) 2 MG tablet Take 2 mg by mouth 2 (two) times daily. 11/29/12   Historical Provider, MD  ziprasidone (GEODON) 60 MG capsule Take 180 mg by mouth every evening.  11/07/12   Historical Provider, MD   BP 150/85  Pulse 80  Temp(Src) 97.9 F (36.6 C) (Oral)  Resp 18  SpO2 95%  Physical Exam  Nursing note and vitals  reviewed. Constitutional: He is oriented to person, place, and time. He appears well-developed and well-nourished. No distress.  HENT:  Head: Normocephalic and atraumatic.  Mouth/Throat: Oropharynx is clear and moist.  Eyes: Conjunctivae and EOM are normal. Pupils are equal, round, and reactive to light.  Neck: Normal range of motion. Neck supple.  Cardiovascular: Normal rate, regular rhythm and normal heart sounds.   Pulmonary/Chest: Effort normal and breath sounds normal. No respiratory distress. He has no wheezes.  Musculoskeletal: Normal range of motion.       Left hand: He exhibits laceration. He exhibits normal range of motion and no tenderness. Normal sensation noted. Normal strength noted.  Left hand with 4 cm jagged laceration to webspace between thumb and index finger; mild amount of pulsatile bleeding present; no bony deformities or tenderness; full ROM of all fingers, specifically thumb and index finger; normal strength against resistance of all fingers; normal grip strength; strong radial pulse and cap refill; sensation intact diffusely throughout hand  Neurological: He is alert and oriented to person, place, and time.  Skin: Skin is warm and dry. He is not diaphoretic.  Psychiatric: He has a normal mood and affect.    ED Course  Procedures (including critical care time)  LACERATION REPAIR Performed by: Garlon HatchetLisa M Eural Holzschuh Authorized by: Garlon HatchetLisa M Avice Funchess Consent: Verbal consent obtained. Risks and benefits: risks, benefits and alternatives were discussed Consent given by: patient Patient identity confirmed: provided demographic data Prepped and Draped in normal sterile fashion Wound explored  Laceration Location: left hand, web space between thumb and index finger  Laceration Length: 4cm  No Foreign Bodies seen or palpated  Anesthesia: local infiltration  Local anesthetic: lidocaine 1% without epinephrine  Anesthetic total: 5 ml  Irrigation method: syringe Amount of  cleaning: standard  Skin closure: 4-0 prolene  Number of sutures: 8  Technique: simple interrupted  Patient tolerance: Patient tolerated the procedure well with no immediate complications.  Labs Review Labs Reviewed - No data to display  Imaging Review No results found.   EKG Interpretation None      MDM   Final diagnoses:  Laceration of left hand   Tetanus is up-to-date. Case was discussed with hand surgery prior to my evaluation, however when called to alert them that pt had arrived and they were unaware he was being sent to ED for their evaluation.  After further exploring wound, bleeding appears to be from superficial arterial supply.  Wound was repaired without difficulty and with sufficient control of bleeding.  Hand remains NVI with full ROM of all fingers.  Will monitor for 30 mins-1 hour for signs of developing hematoma or recurrent bleeding.  3:09 PM Pt has been observed for approx 1 hour after laceration repair.  Bleeding remains well controlled, no evidence of hematoma formation underneath repair, remains NVI.  Pt  will be discharged home.  He will FU with hand surgery if problems occur.  FU with PCP for suture removal in  1 week.  Instructed on home wound care and dressing changes.  Discussed plan with patient, he/she acknowledged understanding and agreed with plan of care.  Return precautions given for new or worsening symptoms.  Case discussed with Dr. Freida Busman who personally evaluated pt and agrees with assessment and plan of care.  Garlon Hatchet, PA-C 07/06/13 1712

## 2013-07-06 NOTE — Progress Notes (Deleted)
   Subjective:    Patient ID: Malik Edwards, male    DOB: 06-16-1975, 38 y.o.   MRN: [id number]  HPI At home using drill   Review of Systems     Objective:   Physical Exam        Assessment & Plan:

## 2013-07-06 NOTE — ED Notes (Signed)
Pt states he ran a drill into his left hand between thumb and pointer finger. Pressure dressing intact from urgent care; states it is pumping due to nicked artery.

## 2013-07-06 NOTE — ED Provider Notes (Signed)
Medical screening examination/treatment/procedure(s) were conducted as a shared visit with non-physician practitioner(s) and myself.  I personally evaluated the patient during the encounter.   EKG Interpretation None     Patient here after digital date injury to his left hand. Bleeding is now controlled. Went to be closed and observed. No neurological deficits noted  Toy Baker, MD 07/06/13 1324

## 2013-07-06 NOTE — Progress Notes (Signed)
   Subjective:    Patient ID: Malik Edwards, male    DOB: [birth year], 38 y.o.   MRN: [id number]  HPI   38 year old Caucasian make presents urgently to clinic following drill accident in the home at approximately 8:20am. Pt has actively bleeding wound to Left hand web space between thumb and 2nd finger.  Piece of wood pt was holding grabbed the drill bit and pushed pt's left hand into the drill. Wife accompanies pt to clinic.  Pt has had tetanus in 2011 or 2012.  No numbness or tingling in Left hand fingers.  Pt able to move all fingers .   Review of Systems     Objective:   Physical Exam  Constitutional: He is oriented to person, place, and time. He appears well-nourished.  HENT:  Head: Normocephalic.  Eyes: EOM are normal. Pupils are equal, round, and reactive to light.  Neck: Normal range of motion. Neck supple.  Cardiovascular: Normal rate.   Pulmonary/Chest: Effort normal.  Musculoskeletal: He exhibits tenderness.  Neurological: He is alert and oriented to person, place, and time. He has normal strength. No sensory deficit. He exhibits normal muscle tone. He displays a negative Romberg sign. Coordination and gait normal.  Two point discrimination fully intact hand and fingers Full rom index and thumb.  Skin: Laceration noted.     Psychiatric: He has a normal mood and affect. His behavior is normal. Judgment and thought content normal.   Hand wound has arterial pumper, 3.5 cm deep web space wound  Pressure dressing re applied      Assessment & Plan:  Left hand wound with arterial bleeder/Controlled with pressure dressing Refer to hand surgeon urgently Keep elevated/Sling

## 2013-07-08 NOTE — ED Provider Notes (Signed)
Medical screening examination/treatment/procedure(s) were conducted as a shared visit with non-physician practitioner(s) and myself.  I personally evaluated the patient during the encounter.   EKG Interpretation None     pts hand examined and without vascular intact, neuro status intact  Toy Baker, MD 07/08/13 1135

## 2013-09-08 ENCOUNTER — Other Ambulatory Visit: Payer: Self-pay | Admitting: Cardiovascular Disease

## 2013-09-08 NOTE — Telephone Encounter (Signed)
Rx was sent to pharmacy electronically. 

## 2013-12-16 ENCOUNTER — Telehealth: Payer: Self-pay | Admitting: Cardiovascular Disease

## 2013-12-16 NOTE — Telephone Encounter (Signed)
Pt called in stating that he was prescribed Brintellix a couple of days ago and he would like to know if this is safe for him to take in addition to the baby aspirin that he is already taking. Please call  Thanks

## 2013-12-16 NOTE — Telephone Encounter (Signed)
Per Belenda Cruise, OK to take new medication with current Rx'ed medications. Med list updated with Brintellix 10mg  QD. Patient voiced understanding

## 2013-12-17 ENCOUNTER — Ambulatory Visit: Payer: BC Managed Care – PPO | Admitting: Cardiovascular Disease

## 2013-12-23 ENCOUNTER — Other Ambulatory Visit: Payer: Self-pay | Admitting: Cardiovascular Disease

## 2014-01-01 ENCOUNTER — Ambulatory Visit: Payer: BC Managed Care – PPO | Admitting: Cardiovascular Disease

## 2014-01-12 ENCOUNTER — Other Ambulatory Visit: Payer: Self-pay | Admitting: Cardiovascular Disease

## 2014-01-26 ENCOUNTER — Other Ambulatory Visit: Payer: Self-pay | Admitting: Cardiovascular Disease

## 2014-01-27 NOTE — Telephone Encounter (Signed)
Rx(s) sent to pharmacy electronically.  

## 2014-02-05 ENCOUNTER — Ambulatory Visit (INDEPENDENT_AMBULATORY_CARE_PROVIDER_SITE_OTHER): Payer: BLUE CROSS/BLUE SHIELD | Admitting: Cardiovascular Disease

## 2014-02-05 ENCOUNTER — Encounter: Payer: Self-pay | Admitting: Cardiovascular Disease

## 2014-02-05 VITALS — BP 124/82 | HR 85 | Resp 16 | Ht 69.0 in | Wt 298.7 lb

## 2014-02-05 DIAGNOSIS — G4733 Obstructive sleep apnea (adult) (pediatric): Secondary | ICD-10-CM

## 2014-02-05 DIAGNOSIS — E785 Hyperlipidemia, unspecified: Secondary | ICD-10-CM

## 2014-02-05 DIAGNOSIS — R600 Localized edema: Secondary | ICD-10-CM

## 2014-02-05 DIAGNOSIS — I10 Essential (primary) hypertension: Secondary | ICD-10-CM

## 2014-02-05 DIAGNOSIS — Z79899 Other long term (current) drug therapy: Secondary | ICD-10-CM

## 2014-02-05 DIAGNOSIS — I4892 Unspecified atrial flutter: Secondary | ICD-10-CM

## 2014-02-05 DIAGNOSIS — I429 Cardiomyopathy, unspecified: Secondary | ICD-10-CM

## 2014-02-05 MED ORDER — PRAVASTATIN SODIUM 40 MG PO TABS: 40.0000 mg | ORAL_TABLET | Freq: Every evening | ORAL | Status: DC

## 2014-02-05 NOTE — Progress Notes (Signed)
Patient ID: Malik Edwards, male   DOB: 12-22-1975, 39 y.o.   MRN: [id number]     Reason for office visit Atrial flutter, tachycardia-induced cardiomyopathy, hypertension, obstructive sleep apnea on CPAP, obesity  Malik Edwards is feeling well. He has adjusted well to treatment with CPAP. He is nearly 100% compliant with it after switching to the nasal pillows. He is morbidly obese and his initial presentation was with atrial fibrillation with rapid ventricular response complicated by moderate tachycardia-induced cardiomyopathy (EF 35-40 percent) with near normalization of left ventricular systolic function following cardioversion and maintenance of normal rhythm. No other evidence of structural heart disease has been identified. He has stopped drinking alcohol but continues to smoke. He continues to struggle with psychiatric problems that are now well compensated. He is no longer taking anticoagulants but is on aspirin.   Allergies  Allergen Reactions  . Statins     Elevated LFTs    Current Outpatient Prescriptions  Medication Sig Dispense Refill  . albuterol (PROVENTIL HFA;VENTOLIN HFA) 108 (90 BASE) MCG/ACT inhaler Inhale 2 puffs into the lungs every 4 (four) hours as needed for wheezing or shortness of breath (cough, shortness of breath or wheezing.). 1 Inhaler 1  . amLODipine (NORVASC) 5 MG tablet TAKE 1 TABLET (5 MG TOTAL) BY MOUTH DAILY. 180 tablet 0  . aspirin 162 MG EC tablet Take 162 mg by mouth daily.    Marland Kitchen BRINTELLIX 20 MG TABS Take 1 tablet by mouth daily.  0  . busPIRone (BUSPAR) 10 MG tablet Take 10 mg by mouth 3 (three) times daily.  0  . carvedilol (COREG) 25 MG tablet TAKE 1 TABLET TWICE A DAY 180 tablet 1  . cholecalciferol (VITAMIN D) 1000 UNITS tablet Take 1,000 Units by mouth daily.    . furosemide (LASIX) 40 MG tablet TAKE 2 TABLETS (80 MG TOTAL) BY MOUTH DAILY. 90 tablet 0  . lisinopril (PRINIVIL,ZESTRIL) 10 MG tablet TAKE 1 TABLET BY MOUTH DAILY 90 tablet 1  .  loperamide (IMODIUM) 2 MG capsule Take 4 mg by mouth 2 (two) times daily.    . Multiple Vitamin (MULTIVITAMIN) tablet Take 1 tablet by mouth daily.    . potassium chloride (K-DUR,KLOR-CON) 10 MEQ tablet Take 10 mEq by mouth 2 (two) times daily.    . ziprasidone (GEODON) 60 MG capsule Take 180 mg by mouth every evening.      No current facility-administered medications for this visit.    Past Medical History  Diagnosis Date  . Hypertension   . Cardiomyopathy   . Colitis   . Depression   . OSA (obstructive sleep apnea) 01/22/2013  . High cholesterol     Past Surgical History  Procedure Laterality Date  . Cardioversion N/A 09/15/2012    Procedure: CARDIOVERSION;  Surgeon: Chrystie Nose, MD;  Location: [facility] OR;  Service: Cardiovascular;  Laterality: N/A;    Family History  Problem Relation Age of Onset  . Healthy Mother   . Healthy Father     History   Social History  . Marital Status: Married    Spouse Name: Bowden Boody    Number of Children: 0  . Years of Education: Assoc. Deg   Occupational History  .  Other    [employer]   Social History Main Topics  . Smoking status: Current Every Day Smoker -- 1.00 packs/day for 15 years    Types: Cigarettes  . Smokeless tobacco: Never Used  . Alcohol Use: 6.0 oz/week    12 drink(s)  per week     Comment: pt and wife agree he is a social drinker, no hx of addiction/withdrawal  . Drug Use: No  . Sexual Activity: Yes    Birth Control/ Protection: None   Other Topics Concern  . Not on file   Social History Narrative   Patient lives at home with spouse.   Caffeine Use: 1-2 cups daily    Review of systems: The patient specifically denies any chest pain at rest or with exertion, dyspnea at rest or with exertion, orthopnea, paroxysmal nocturnal dyspnea, syncope, palpitations, focal neurological deficits, intermittent claudication, lower extremity edema, unexplained weight gain, cough, hemoptysis or wheezing.  The patient  also denies abdominal pain, nausea, vomiting, dysphagia, diarrhea, constipation, polyuria, polydipsia, dysuria, hematuria, frequency, urgency, abnormal bleeding or bruising, fever, chills, unexpected weight changes, mood swings, change in skin or hair texture, change in voice quality, auditory or visual problems, allergic reactions or rashes, new musculoskeletal complaints other than usual "aches and pains".   PHYSICAL EXAM BP 124/82 mmHg  Pulse 85  Resp 16  Ht 5\' 9"  (1.753 m)  Wt 298 lb 11.2 oz (135.489 kg)  BMI 44.09 kg/m2 Orbit obesity limits the exam General: Alert, oriented x3, no distress Head: no evidence of trauma, PERRL, EOMI, no exophtalmos or lid lag, no myxedema, no xanthelasma; normal ears, nose and oropharynx Neck: normal jugular venous pulsations and no hepatojugular reflux; brisk carotid pulses without delay and no carotid bruits Chest: clear to auscultation, no signs of consolidation by percussion or palpation, normal fremitus, symmetrical and full respiratory excursions Cardiovascular: Difficult to identify the apical impulse, regular rhythm, normal first and second heart sounds, no murmurs, rubs or gallops Abdomen: no tenderness or distention, no masses by palpation, no abnormal pulsatility or arterial bruits, normal bowel sounds, no hepatosplenomegaly Extremities: no clubbing, cyanosis or edema; 2+ radial, ulnar and brachial pulses bilaterally; 2+ right femoral, posterior tibial and dorsalis pedis pulses; 2+ left femoral, posterior tibial and dorsalis pedis pulses; no subclavian or femoral bruits Neurological: grossly nonfocal   EKG: Normal sinus rhythm, possible left atrial abnormality, otherwise normal, QTC in normal range  Lipid Panel  No results found for: CHOL, TRIG, HDL, CHOLHDL, VLDL, LDLCALC, LDLDIRECT  BMET    Component Value Date/Time   NA 138 09/13/2012 0600   K 4.4 09/13/2012 0600   CL 103 09/13/2012 0600   CO2 24 09/13/2012 0600   GLUCOSE 112*  09/13/2012 0600   BUN 12 09/13/2012 0600   CREATININE 0.74 09/13/2012 0600   CALCIUM 9.3 09/13/2012 0600   GFRNONAA >90 09/13/2012 0600   GFRAA >90 09/13/2012 0600     ASSESSMENT AND PLAN  I think most if not all of Malik Edwards cardiovascular problems have the routine morbid obesity and secondary obstructive sleep apnea subsequent complicated by cor pulmonale, atrial flutter and tachycardia-induced cardiomyopathy. Hopefully treatment of the sleep apnea will lead to successful prevention of future arrhythmia. Losing substantial weight would have additional positive impact. This was discussed in detail today.  He was treated with atorvastatin for hyperlipidemia but developed elevated liver function tests. Will try pravastatin 40 mg at bedtime instead and recheck liver tests and lipid levels in about 3 months.  Blood pressure control is excellent. No evidence of congestive heart failure. Compliance with CPAP is good and he is congratulated on this.  Strongly recommend smoking cessation. I'm very pleased that he has stopped drinking and understand smoking cessation will be difficult in view of his other psychiatric issues.  Meds  ordered this encounter  Medications  . busPIRone (BUSPAR) 10 MG tablet    Sig: Take 10 mg by mouth 3 (three) times daily.    Refill:  0  . BRINTELLIX 20 MG TABS    Sig: Take 1 tablet by mouth daily.    Refill:  0  . potassium chloride (K-DUR,KLOR-CON) 10 MEQ tablet    Sig: Take 10 mEq by mouth 2 (two) times daily.    Junious Silk, MD, [place] [facility] [phone] office [phone] pager

## 2014-02-05 NOTE — Patient Instructions (Signed)
START Pravastatin 40mg  take one daily in the evening.  Your physician recommends that you return for lab work in: 3 months (APRIL 2016) at [facility].  Dr. Royann Shivers recommends that you schedule a follow-up appointment in: ONE YEAR.

## 2014-03-01 ENCOUNTER — Other Ambulatory Visit: Payer: Self-pay | Admitting: Cardiovascular Disease

## 2014-03-01 NOTE — Telephone Encounter (Signed)
Rx(s) sent to pharmacy electronically.  

## 2014-03-12 ENCOUNTER — Other Ambulatory Visit: Payer: Self-pay | Admitting: Cardiovascular Disease

## 2014-03-12 NOTE — Telephone Encounter (Signed)
Rx(s) sent to pharmacy electronically.  

## 2014-04-03 ENCOUNTER — Other Ambulatory Visit: Payer: Self-pay | Admitting: Cardiovascular Disease

## 2014-07-27 ENCOUNTER — Other Ambulatory Visit: Payer: Self-pay | Admitting: Cardiovascular Disease

## 2014-09-15 ENCOUNTER — Other Ambulatory Visit: Payer: Self-pay | Admitting: Cardiovascular Disease

## 2014-09-15 NOTE — Telephone Encounter (Signed)
Rx(s) sent to pharmacy electronically.  

## 2014-12-14 ENCOUNTER — Telehealth: Payer: Self-pay | Admitting: Cardiovascular Disease

## 2014-12-14 NOTE — Telephone Encounter (Signed)
Advice communicated, understanding verbalized.

## 2014-12-14 NOTE — Telephone Encounter (Signed)
Returned call to patient's wife. She reports pt lost 35 lbs on [facility] program over past 3 months. Has been checking BP and notes lower than it was. Had been running 140/90, now 110-112/60-80 last couple checks.  Was instructed by weight loss program to notify physician if BP drops below 110 systolic.  Recommended no adjustments to meds at this time. I advised to continue to keep log of daily BPs.  Call if symptoms (lightheadedness on standing, etc), or if BP running consistently lower, Dr. Royann Shivers may recommend changes. Discussed hold or cutting dose of lisinopril if symptoms.  Pt has yearly f/u visit in January.

## 2014-12-14 NOTE — Telephone Encounter (Signed)
Tell him congratulations and ask him to reduce the amlodipine to 2.5 mg daily (half a tablet daily). If his blood pressure continues to drop, may stop this medication altogether.

## 2014-12-14 NOTE — Telephone Encounter (Signed)
Pt have lost a lot of weight,his blood pressure is coming down. Wonder if his medicine need to be adjusted,blood pressure is so much better.

## 2014-12-27 ENCOUNTER — Encounter: Payer: Self-pay | Admitting: Internal Medicine

## 2014-12-30 ENCOUNTER — Other Ambulatory Visit: Payer: Self-pay | Admitting: Cardiovascular Disease

## 2014-12-30 NOTE — Telephone Encounter (Signed)
REFILL 

## 2015-01-10 ENCOUNTER — Emergency Department (HOSPITAL_COMMUNITY)
Admission: EM | Admit: 2015-01-10 | Discharge: 2015-01-10 | Disposition: A | Discharge status: Other Acute Inpatient Hospital | Payer: 59 | Attending: Emergency Medicine | Admitting: Emergency Medicine

## 2015-01-10 ENCOUNTER — Other Ambulatory Visit: Payer: Self-pay | Admitting: Cardiovascular Disease

## 2015-01-10 ENCOUNTER — Encounter (HOSPITAL_COMMUNITY): Payer: Self-pay | Admitting: *Deleted

## 2015-01-10 ENCOUNTER — Encounter (HOSPITAL_COMMUNITY): Payer: Self-pay

## 2015-01-10 ENCOUNTER — Inpatient Hospital Stay (HOSPITAL_COMMUNITY): Admission: AD | Admit: 2015-01-10 | Payer: 59 | Source: Intra-hospital | Attending: Psychiatry | Admitting: Psychiatry

## 2015-01-10 ENCOUNTER — Inpatient Hospital Stay (HOSPITAL_COMMUNITY)
Admission: AD | Admit: 2015-01-10 | Discharge: 2015-01-14 | DRG: 885 | Disposition: A | Discharge status: Home / Self Care | Payer: 59 | Source: Intra-hospital | Attending: Psychiatry | Admitting: Psychiatry

## 2015-01-10 DIAGNOSIS — F17210 Nicotine dependence, cigarettes, uncomplicated: Secondary | ICD-10-CM | POA: Insufficient documentation

## 2015-01-10 DIAGNOSIS — F259 Schizoaffective disorder, unspecified: Secondary | ICD-10-CM | POA: Diagnosis present

## 2015-01-10 DIAGNOSIS — E7800 Pure hypercholesterolemia, unspecified: Secondary | ICD-10-CM | POA: Diagnosis not present

## 2015-01-10 DIAGNOSIS — Z79899 Other long term (current) drug therapy: Secondary | ICD-10-CM | POA: Diagnosis not present

## 2015-01-10 DIAGNOSIS — G4733 Obstructive sleep apnea (adult) (pediatric): Secondary | ICD-10-CM | POA: Diagnosis present

## 2015-01-10 DIAGNOSIS — R45851 Suicidal ideations: Secondary | ICD-10-CM | POA: Diagnosis present

## 2015-01-10 DIAGNOSIS — F10230 Alcohol dependence with withdrawal, uncomplicated: Secondary | ICD-10-CM | POA: Diagnosis present

## 2015-01-10 DIAGNOSIS — F332 Major depressive disorder, recurrent severe without psychotic features: Secondary | ICD-10-CM | POA: Diagnosis present

## 2015-01-10 DIAGNOSIS — I10 Essential (primary) hypertension: Secondary | ICD-10-CM | POA: Insufficient documentation

## 2015-01-10 DIAGNOSIS — Z8669 Personal history of other diseases of the nervous system and sense organs: Secondary | ICD-10-CM | POA: Diagnosis not present

## 2015-01-10 DIAGNOSIS — F329 Major depressive disorder, single episode, unspecified: Secondary | ICD-10-CM | POA: Diagnosis not present

## 2015-01-10 DIAGNOSIS — Z720 Tobacco use: Secondary | ICD-10-CM | POA: Diagnosis present

## 2015-01-10 DIAGNOSIS — G4700 Insomnia, unspecified: Secondary | ICD-10-CM | POA: Diagnosis present

## 2015-01-10 DIAGNOSIS — Z8719 Personal history of other diseases of the digestive system: Secondary | ICD-10-CM | POA: Diagnosis not present

## 2015-01-10 DIAGNOSIS — Z7982 Long term (current) use of aspirin: Secondary | ICD-10-CM | POA: Diagnosis not present

## 2015-01-10 DIAGNOSIS — F251 Schizoaffective disorder, depressive type: Secondary | ICD-10-CM | POA: Diagnosis present

## 2015-01-10 DIAGNOSIS — F4310 Post-traumatic stress disorder, unspecified: Secondary | ICD-10-CM | POA: Clinically undetermined

## 2015-01-10 DIAGNOSIS — F209 Schizophrenia, unspecified: Secondary | ICD-10-CM | POA: Diagnosis present

## 2015-01-10 DIAGNOSIS — F1310 Sedative, hypnotic or anxiolytic abuse, uncomplicated: Secondary | ICD-10-CM | POA: Diagnosis not present

## 2015-01-10 DIAGNOSIS — F1020 Alcohol dependence, uncomplicated: Secondary | ICD-10-CM | POA: Diagnosis present

## 2015-01-10 DIAGNOSIS — Z6841 Body Mass Index (BMI) 40.0 and over, adult: Secondary | ICD-10-CM

## 2015-01-10 DIAGNOSIS — F1910 Other psychoactive substance abuse, uncomplicated: Secondary | ICD-10-CM

## 2015-01-10 HISTORY — DX: Anxiety disorder, unspecified: F41.9

## 2015-01-10 LAB — RAPID URINE DRUG SCREEN, HOSP PERFORMED
AMPHETAMINES: NOT DETECTED
Barbiturates: NOT DETECTED
Benzodiazepines: POSITIVE — AB
Cocaine: NOT DETECTED
OPIATES: NOT DETECTED
TETRAHYDROCANNABINOL: NOT DETECTED

## 2015-01-10 LAB — COMPREHENSIVE METABOLIC PANEL
ALK PHOS: 71 U/L (ref 38–126)
ALT: 39 U/L (ref 17–63)
ANION GAP: 13 (ref 5–15)
AST: 30 U/L (ref 15–41)
Albumin: 4.1 g/dL (ref 3.5–5.0)
BUN: 13 mg/dL (ref 6–20)
CALCIUM: 9.4 mg/dL (ref 8.9–10.3)
CO2: 23 mmol/L (ref 22–32)
Chloride: 105 mmol/L (ref 101–111)
Creatinine, Ser: 0.71 mg/dL (ref 0.61–1.24)
Glucose, Bld: 99 mg/dL (ref 65–99)
Potassium: 3.6 mmol/L (ref 3.5–5.1)
Sodium: 141 mmol/L (ref 135–145)
Total Bilirubin: 0.5 mg/dL (ref 0.3–1.2)
Total Protein: 7.9 g/dL (ref 6.5–8.1)

## 2015-01-10 LAB — CBC
HCT: 45.1 % (ref 39.0–52.0)
HEMOGLOBIN: 16 g/dL (ref 13.0–17.0)
MCH: 32.2 pg (ref 26.0–34.0)
MCHC: 35.5 g/dL (ref 30.0–36.0)
MCV: 90.7 fL (ref 78.0–100.0)
Platelets: 261 10*3/uL (ref 150–400)
RBC: 4.97 MIL/uL (ref 4.22–5.81)
RDW: 13.3 % (ref 11.5–15.5)
WBC: 11 10*3/uL — AB (ref 4.0–10.5)

## 2015-01-10 LAB — ACETAMINOPHEN LEVEL: Acetaminophen (Tylenol), Serum: <10 ug/mL — ABNORMAL LOW (ref 10–30)

## 2015-01-10 LAB — SALICYLATE LEVEL

## 2015-01-10 LAB — ETHANOL: ALCOHOL ETHYL (B): 132 mg/dL — AB (ref <5)

## 2015-01-10 MED ORDER — ALBUTEROL SULFATE HFA 108 (90 BASE) MCG/ACT IN AERS: 2.0000 | INHALATION_SPRAY | RESPIRATORY_TRACT | Status: DC | PRN

## 2015-01-10 MED ORDER — ZIPRASIDONE HCL 60 MG PO CAPS
120.0000 mg | ORAL_CAPSULE | Freq: Every evening | ORAL | Status: DC
  Administered 2015-01-10: 120 mg via ORAL
  Filled 2015-01-10: qty 2
  Filled 2015-01-10: qty 6
  Filled 2015-01-10: qty 2

## 2015-01-10 MED ORDER — POTASSIUM CHLORIDE CRYS ER 10 MEQ PO TBCR: 10.0000 meq | EXTENDED_RELEASE_TABLET | Freq: Every day | ORAL | Status: DC

## 2015-01-10 MED ORDER — ACETAMINOPHEN 325 MG PO TABS: 650.0000 mg | ORAL_TABLET | Freq: Four times a day (QID) | ORAL | Status: DC | PRN

## 2015-01-10 MED ORDER — LISINOPRIL 10 MG PO TABS
10.0000 mg | ORAL_TABLET | Freq: Every day | ORAL | Status: DC
Start: 2015-01-10 — End: 2015-01-10
  Administered 2015-01-10: 10 mg via ORAL
  Filled 2015-01-10: qty 1

## 2015-01-10 MED ORDER — FUROSEMIDE 80 MG PO TABS
80.0000 mg | ORAL_TABLET | Freq: Every day | ORAL | Status: DC
Start: 2015-01-10 — End: 2015-01-10
  Filled 2015-01-10: qty 1

## 2015-01-10 MED ORDER — AMLODIPINE BESYLATE 2.5 MG PO TABS
2.5000 mg | ORAL_TABLET | Freq: Every day | ORAL | Status: DC
  Administered 2015-01-11 – 2015-01-14 (×4): 2.5 mg via ORAL
  Filled 2015-01-10 (×6): qty 1

## 2015-01-10 MED ORDER — ZIPRASIDONE HCL 20 MG PO CAPS: 120.0000 mg | ORAL_CAPSULE | Freq: Every evening | ORAL | Status: DC

## 2015-01-10 MED ORDER — AMLODIPINE BESYLATE 2.5 MG PO TABS
2.5000 mg | ORAL_TABLET | Freq: Every day | ORAL | Status: DC
  Administered 2015-01-10: 2.5 mg via ORAL
  Filled 2015-01-10: qty 1

## 2015-01-10 MED ORDER — ASPIRIN EC 81 MG PO TBEC
162.0000 mg | DELAYED_RELEASE_TABLET | Freq: Every day | ORAL | Status: DC
  Administered 2015-01-11 – 2015-01-14 (×4): 162 mg via ORAL
  Filled 2015-01-10 (×6): qty 2

## 2015-01-10 MED ORDER — VITAMIN D3 25 MCG (1000 UNIT) PO TABS
1000.0000 [IU] | ORAL_TABLET | Freq: Every day | ORAL | Status: DC
  Administered 2015-01-11 – 2015-01-14 (×4): 1000 [IU] via ORAL
  Filled 2015-01-10 (×6): qty 1

## 2015-01-10 MED ORDER — POTASSIUM CHLORIDE CRYS ER 20 MEQ PO TBCR
20.0000 meq | EXTENDED_RELEASE_TABLET | Freq: Every day | ORAL | Status: DC
  Filled 2015-01-10: qty 1

## 2015-01-10 MED ORDER — AMLODIPINE BESYLATE 2.5 MG PO TABS: 2.5000 mg | ORAL_TABLET | Freq: Every day | ORAL | Status: DC

## 2015-01-10 MED ORDER — NICOTINE 21 MG/24HR TD PT24
21.0000 mg | MEDICATED_PATCH | Freq: Every day | TRANSDERMAL | Status: DC
  Administered 2015-01-10 – 2015-01-14 (×5): 21 mg via TRANSDERMAL
  Filled 2015-01-10 (×7): qty 1

## 2015-01-10 MED ORDER — FUROSEMIDE 40 MG PO TABS: 40.0000 mg | ORAL_TABLET | Freq: Every day | ORAL | Status: DC

## 2015-01-10 MED ORDER — ALUM & MAG HYDROXIDE-SIMETH 200-200-20 MG/5ML PO SUSP: 30.0000 mL | ORAL | Status: DC | PRN

## 2015-01-10 MED ORDER — PRAVASTATIN SODIUM 40 MG PO TABS
40.0000 mg | ORAL_TABLET | Freq: Every evening | ORAL | Status: DC
  Filled 2015-01-10: qty 1

## 2015-01-10 MED ORDER — TRAZODONE HCL 50 MG PO TABS
50.0000 mg | ORAL_TABLET | Freq: Every evening | ORAL | Status: DC | PRN
  Administered 2015-01-11: 50 mg via ORAL
  Filled 2015-01-10 (×7): qty 1

## 2015-01-10 MED ORDER — VORTIOXETINE HBR 20 MG PO TABS
20.0000 mg | ORAL_TABLET | Freq: Every day | ORAL | Status: DC
Start: 2015-01-11 — End: 2015-01-11
  Administered 2015-01-11: 20 mg via ORAL
  Filled 2015-01-10 (×2): qty 20

## 2015-01-10 MED ORDER — BUSPIRONE HCL 10 MG PO TABS: 10.0000 mg | ORAL_TABLET | Freq: Three times a day (TID) | ORAL | Status: DC

## 2015-01-10 MED ORDER — ASPIRIN EC 81 MG PO TBEC
162.0000 mg | DELAYED_RELEASE_TABLET | Freq: Every day | ORAL | Status: DC
  Administered 2015-01-10: 162 mg via ORAL
  Filled 2015-01-10: qty 2

## 2015-01-10 MED ORDER — LORAZEPAM 1 MG PO TABS: 0.0000 mg | ORAL_TABLET | Freq: Two times a day (BID) | ORAL | Status: DC

## 2015-01-10 MED ORDER — CARVEDILOL 25 MG PO TABS
25.0000 mg | ORAL_TABLET | Freq: Two times a day (BID) | ORAL | Status: DC
Start: 2015-01-10 — End: 2015-01-10
  Administered 2015-01-10: 25 mg via ORAL
  Filled 2015-01-10 (×3): qty 1

## 2015-01-10 MED ORDER — BUSPIRONE HCL 10 MG PO TABS
10.0000 mg | ORAL_TABLET | Freq: Three times a day (TID) | ORAL | Status: DC
Start: 2015-01-10 — End: 2015-01-10
  Administered 2015-01-10: 10 mg via ORAL
  Filled 2015-01-10: qty 1

## 2015-01-10 MED ORDER — LORAZEPAM 1 MG PO TABS
0.0000 mg | ORAL_TABLET | Freq: Four times a day (QID) | ORAL | Status: DC
  Administered 2015-01-10: 1 mg via ORAL
  Filled 2015-01-10: qty 1

## 2015-01-10 MED ORDER — VORTIOXETINE HBR 20 MG PO TABS
20.0000 mg | ORAL_TABLET | Freq: Every day | ORAL | Status: DC
Start: 2015-01-10 — End: 2015-01-10
  Administered 2015-01-10: 20 mg via ORAL
  Filled 2015-01-10: qty 20

## 2015-01-10 MED ORDER — ZIPRASIDONE HCL 20 MG PO CAPS: 180.0000 mg | ORAL_CAPSULE | Freq: Every evening | ORAL | Status: DC

## 2015-01-10 MED ORDER — LORAZEPAM 1 MG PO TABS
0.0000 mg | ORAL_TABLET | Freq: Four times a day (QID) | ORAL | Status: DC
  Administered 2015-01-10 (×2): 1 mg via ORAL
  Filled 2015-01-10 (×2): qty 1

## 2015-01-10 MED ORDER — CARVEDILOL 25 MG PO TABS
25.0000 mg | ORAL_TABLET | Freq: Two times a day (BID) | ORAL | Status: DC
  Administered 2015-01-10 – 2015-01-14 (×8): 25 mg via ORAL
  Filled 2015-01-10 (×6): qty 1
  Filled 2015-01-10: qty 2
  Filled 2015-01-10 (×6): qty 1

## 2015-01-10 MED ORDER — ZIPRASIDONE HCL 60 MG PO CAPS
ORAL_CAPSULE | ORAL | Status: AC
  Filled 2015-01-10: qty 2

## 2015-01-10 MED ORDER — VITAMIN D3 25 MCG (1000 UNIT) PO TABS
1000.0000 [IU] | ORAL_TABLET | Freq: Every day | ORAL | Status: DC
  Administered 2015-01-10: 1000 [IU] via ORAL
  Filled 2015-01-10: qty 1

## 2015-01-10 MED ORDER — MAGNESIUM HYDROXIDE 400 MG/5ML PO SUSP: 30.0000 mL | Freq: Every day | ORAL | Status: DC | PRN

## 2015-01-10 MED ORDER — BUSPIRONE HCL 10 MG PO TABS
10.0000 mg | ORAL_TABLET | Freq: Two times a day (BID) | ORAL | Status: DC
Start: 2015-01-10 — End: 2015-01-13
  Administered 2015-01-10 – 2015-01-13 (×6): 10 mg via ORAL
  Filled 2015-01-10 (×3): qty 1
  Filled 2015-01-10: qty 2
  Filled 2015-01-10 (×5): qty 1

## 2015-01-10 MED ORDER — FUROSEMIDE 40 MG PO TABS
40.0000 mg | ORAL_TABLET | Freq: Every day | ORAL | Status: DC
  Administered 2015-01-10: 40 mg via ORAL
  Filled 2015-01-10: qty 1

## 2015-01-10 MED ORDER — POTASSIUM CHLORIDE CRYS ER 10 MEQ PO TBCR
10.0000 meq | EXTENDED_RELEASE_TABLET | Freq: Two times a day (BID) | ORAL | Status: DC
  Administered 2015-01-10: 10 meq via ORAL
  Filled 2015-01-10: qty 1

## 2015-01-10 MED ORDER — POTASSIUM CHLORIDE CRYS ER 10 MEQ PO TBCR
10.0000 meq | EXTENDED_RELEASE_TABLET | Freq: Every day | ORAL | Status: DC
  Administered 2015-01-11 – 2015-01-14 (×4): 10 meq via ORAL
  Filled 2015-01-10 (×6): qty 1

## 2015-01-10 MED ORDER — FUROSEMIDE 40 MG PO TABS
40.0000 mg | ORAL_TABLET | Freq: Every day | ORAL | Status: DC
  Administered 2015-01-11 – 2015-01-14 (×4): 40 mg via ORAL
  Filled 2015-01-10 (×6): qty 1

## 2015-01-10 MED ORDER — AMLODIPINE BESYLATE 5 MG PO TABS
5.0000 mg | ORAL_TABLET | Freq: Every day | ORAL | Status: DC
  Filled 2015-01-10: qty 1

## 2015-01-10 MED ORDER — LISINOPRIL 10 MG PO TABS
10.0000 mg | ORAL_TABLET | Freq: Every day | ORAL | Status: DC
  Administered 2015-01-11 – 2015-01-14 (×4): 10 mg via ORAL
  Filled 2015-01-10 (×6): qty 1

## 2015-01-10 NOTE — Tx Team (Signed)
Initial Interdisciplinary Treatment Plan   PATIENT STRESSORS: Financial difficulties Marital or family conflict Medication change or noncompliance Occupational concerns Substance abuse   PATIENT STRENGTHS: Ability for insight Active sense of humor Average or above average intelligence Capable of independent living Metallurgist fund of knowledge Motivation for treatment/growth Supportive family/friends Work skills   PROBLEM LIST: Problem List/Patient Goals Date to be addressed Date deferred Reason deferred Estimated date of resolution  "suicidal thoughts" 01/10/2015   D/c  "substance abuse" 01/10/2015   D/c  "anxiety" 01/10/2015   D/c  "depression" 01/10/2015   D/c                                 DISCHARGE CRITERIA:  Ability to meet basic life and health needs Adequate post-discharge living arrangements Improved stabilization in mood, thinking, and/or behavior Medical problems require only outpatient monitoring Motivation to continue treatment in a less acute level of care Need for constant or close observation no longer present Reduction of life-threatening or endangering symptoms to within safe limits Safe-care adequate arrangements made Verbal commitment to aftercare and medication compliance Withdrawal symptoms are absent or subacute and managed without 24-hour nursing intervention  PRELIMINARY DISCHARGE PLAN: Attend aftercare/continuing care group Attend PHP/IOP Attend 12-step recovery group Outpatient therapy Participate in family therapy Placement in alternative living arrangements Return to previous living arrangement Return to previous work or school arrangements  PATIENT/FAMIILY INVOLVEMENT: This treatment plan has been presented to and reviewed with the patient, Malik Edwards.  The patient and family have been given the opportunity to ask questions and make suggestions.  Earline Mayotte 01/10/2015, 6:14  PM

## 2015-01-10 NOTE — ED Notes (Addendum)
Pt belongings placed in 3 bags; Pt also brought one bag of extra clothes from home making a total of 4 bags.  Wife has pt's wallet and cell phone and states that she will take them home.

## 2015-01-10 NOTE — Progress Notes (Signed)
Adult Psychoeducational Group Note  Date:  01/10/2015 Time:  10:05 PM  Group Topic/Focus:  Wrap-Up Group:   The focus of this group is to help patients review their daily goal of treatment and discuss progress on daily workbooks.  Participation Level:  Active  Participation Quality:  Appropriate  Affect:  Appropriate  Cognitive:  Appropriate  Insight: Good  Engagement in Group:  Engaged  Modes of Intervention:  Activity  Additional Comments:  Patient rated his day a 3. Goal is to talk to doctor about medications.  Claria Dice Charlei Ramsaran 01/10/2015, 10:05 PM

## 2015-01-10 NOTE — Progress Notes (Signed)
Patient to be accepted to [facility] today when bed becomes available. Rosey Bath, RN

## 2015-01-10 NOTE — ED Notes (Signed)
Patient appears calm, cooperative. Denies SI, HI, aVH at present. Drowsy.   Encouragement offered. Oriented to the unit. Pt has glasses on his person.  q 15 safety checks in place.

## 2015-01-10 NOTE — ED Notes (Signed)
Bed: [facility] Expected date:  Expected time:  Means of arrival:  Comments: tr5

## 2015-01-10 NOTE — ED Notes (Signed)
Pt reports that he felt suicidal and relapsed on alcohol. Pt currently denies si and hi. Pt has a flat/sad affect. He is in his room visiting with his wife and watching TV. Safety maintained in the [facility].
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
## 2015-01-10 NOTE — ED Provider Notes (Signed)
CSN: [id number]     Arrival date & time 01/10/15  0038 History   First MD Initiated Contact with Patient 01/10/15 0108     Chief Complaint  Patient presents with  . Suicidal     (Consider location/radiation/quality/duration/timing/severity/associated sxs/prior Treatment) HPI Comments: SI thought for a week intermittent "relapse"from soberity  Drank tonight 18 beers  Saw his therapist Wednesday who wanted to admit him but refused.  The history is provided by the patient.    Past Medical History  Diagnosis Date  . Hypertension   . Cardiomyopathy (HCC)   . Colitis   . Depression   . OSA (obstructive sleep apnea) 01/22/2013  . High cholesterol    Past Surgical History  Procedure Laterality Date  . Cardioversion N/A 09/15/2012    Procedure: CARDIOVERSION;  Surgeon: Chrystie Nose, MD;  Location: [facility] OR;  Service: Cardiovascular;  Laterality: N/A;   Family History  Problem Relation Age of Onset  . Healthy Mother   . Healthy Father    Social History  Substance Use Topics  . Smoking status: Current Every Day Smoker -- 1.00 packs/day for 15 years    Types: Cigarettes  . Smokeless tobacco: Never Used  . Alcohol Use: 6.0 oz/week    12 drink(s) per week     Comment: pt and wife agree he is a social drinker, no hx of addiction/withdrawal    Review of Systems  Constitutional: Negative for fever and chills.  Respiratory: Negative for cough.   Gastrointestinal: Negative for vomiting.  Genitourinary: Negative for dysuria.  Musculoskeletal: Negative for myalgias.  Skin: Negative for rash.  Neurological: Negative for dizziness and headaches.  Psychiatric/Behavioral: Positive for suicidal ideas.  All other systems reviewed and are negative.     Allergies  Statins  Home Medications   Prior to Admission medications   Medication Sig Start Date End Date Taking? Authorizing Provider  albuterol (PROVENTIL HFA;VENTOLIN HFA) 108 (90 BASE) MCG/ACT inhaler Inhale 2 puffs into the  lungs every 4 (four) hours as needed for wheezing or shortness of breath (cough, shortness of breath or wheezing.). 05/06/13  Yes Carmelina Dane, MD  amLODipine (NORVASC) 5 MG tablet TAKE 1 TABLET (5 MG TOTAL) BY MOUTH DAILY. Patient taking differently: TAKE 0.5 TABLET (2.5 MG TOTAL) BY MOUTH DAILY. 12/30/14  Yes Mihai Croitoru, MD  aspirin 162 MG EC tablet Take 162 mg by mouth daily.   Yes Historical Provider, MD  BRINTELLIX 20 MG TABS Take 1 tablet by mouth daily. 12/23/13  Yes Historical Provider, MD  carvedilol (COREG) 25 MG tablet TAKE 1 TABLET TWICE A DAY 09/15/14  Yes Mihai Croitoru, MD  cholecalciferol (VITAMIN D) 1000 UNITS tablet Take 2,000 Units by mouth daily.    Yes Historical Provider, MD  doxepin (SINEQUAN) 25 MG capsule Take 25 mg by mouth at bedtime as needed (for sleep).   Yes Historical Provider, MD  furosemide (LASIX) 40 MG tablet TAKE 2 TABLETS (80 MG TOTAL) BY MOUTH DAILY. Patient taking differently: TAKE 1 TABLETS (40 MG TOTAL) BY MOUTH DAILY. 04/05/14  Yes Mihai Croitoru, MD  KLOR-CON M20 20 MEQ tablet TAKE 1 TABLET (20 MEQ TOTAL) BY MOUTH DAILY. Patient taking differently: TAKE 0.5 TABLET (10 MEQ TOTAL) BY MOUTH DAILY. 07/28/14  Yes Mihai Croitoru, MD  lisinopril (PRINIVIL,ZESTRIL) 10 MG tablet TAKE 1 TABLET BY MOUTH DAILY 09/15/14  Yes Mihai Croitoru, MD  loperamide (IMODIUM) 2 MG capsule Take 4 mg by mouth 2 (two) times daily.   Yes Historical Provider, MD  Multiple Vitamin (MULTIVITAMIN) tablet Take 1 tablet by mouth daily.   Yes Historical Provider, MD  pravastatin (PRAVACHOL) 40 MG tablet Take 1 tablet (40 mg total) by mouth every evening. 02/05/14  Yes Mihai Croitoru, MD  ziprasidone (GEODON) 60 MG capsule Take 120 mg by mouth every evening.  11/07/12  Yes Historical Provider, MD   BP 131/78 mmHg  Pulse 91  Temp(Src) 97.8 F (36.6 C) (Oral)  Resp 18  SpO2 92% Physical Exam  Constitutional: He is oriented to person, place, and time. He appears well-developed and  well-nourished.  HENT:  Head: Normocephalic.  Eyes: Pupils are equal, round, and reactive to light.  Cardiovascular: Normal rate and regular rhythm.   Pulmonary/Chest: Effort normal and breath sounds normal.  Neurological: He is alert and oriented to person, place, and time.  Skin: Skin is warm and dry.  Psychiatric: He is slowed and withdrawn. He expresses inappropriate judgment. He exhibits a depressed mood. He expresses suicidal ideation. He expresses suicidal plans.  Nursing note and vitals reviewed.   ED Course  Procedures (including critical care time) Labs Review Labs Reviewed  ETHANOL - Abnormal; Notable for the following:    Alcohol, Ethyl (B) 132 (*)    All other components within normal limits  ACETAMINOPHEN LEVEL - Abnormal; Notable for the following:    Acetaminophen (Tylenol), Serum <10 (*)    All other components within normal limits  CBC - Abnormal; Notable for the following:    WBC 11.0 (*)    All other components within normal limits  URINE RAPID DRUG SCREEN, HOSP PERFORMED - Abnormal; Notable for the following:    Benzodiazepines POSITIVE (*)    All other components within normal limits  COMPREHENSIVE METABOLIC PANEL  SALICYLATE LEVEL    Imaging Review No results found. I have personally reviewed and evaluated these images and lab results as part of my medical decision-making.   EKG Interpretation None     Has been assessed by TTS meets criteria but no bed available  At this time  MDM   Final diagnoses:  Suicide ideation  Substance abuse         [name], NP 01/10/15 0232  April Palumbo, MD 01/10/15 [phone]

## 2015-01-10 NOTE — Progress Notes (Signed)
D: Patient in the hallway on approach.  Patient states he is anxious.  Patient states he is having minimal withdrawal symptoms.  Patient states his goal is to get his medications changed so he will felt better.  Patient presents with an anxious affect. Patient denies SI/HI and denies AVH. A: Staff to monitor Q 15 mins for safety.  Encouragement and support offered.  Scheduled medications administered per orders. R: Patient remains safe on the unit.  Patient attended group tonight.  Patient visible on the on the unit.  Patient taking administered medications.
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
## 2015-01-10 NOTE — BH Assessment (Signed)
[facility] Assessment Progress Note  Per Thedore Mins, MD, this pt requires psychiatric hospitalization at this time.  Rosey Bath, RN, [facility] has assigned pt to [facility] Rm 303-1.  Pt has signed Voluntary Admission and Consent for Treatment, as well as Consent to Release Information to Ellamae Sia, MD and Eudelia Bunch, his outpatient providers at [facility], and a notification call has been placed.  Signed forms have been faxed to [facility].  Pt's nurse, Jan, has been notified, and agrees to send original paperwork along with pt via Juel Burrow, and to call report to [phone].  Doylene Canning, MA Triage Specialist [phone]

## 2015-01-10 NOTE — Telephone Encounter (Signed)
REFILL 

## 2015-01-10 NOTE — ED Notes (Signed)
Pt complaining of suicidal thoughts x 1 week. Hx of same. When asked if he had any plans, he states "I have lots of plans, a gun would make it easiest." Reports no guns in the home. Denies any life changes or stopping medication. Pt is quiet with a flat affect. Wife at bedside. Pt relapsed with alcohol on Friday. Wife went to a concert and pt drank an 18 pack tonight while she was gone. Saw his counselor earlier this week, who wanted to keep him inpatient, but he refused. Pt has been to [facility] and [facility] in the past. Denies auditory or visual hallucinations. Denies HI.

## 2015-01-11 ENCOUNTER — Encounter (HOSPITAL_COMMUNITY): Payer: Self-pay | Admitting: Psychiatry

## 2015-01-11 DIAGNOSIS — F332 Major depressive disorder, recurrent severe without psychotic features: Secondary | ICD-10-CM | POA: Diagnosis present

## 2015-01-11 DIAGNOSIS — R45851 Suicidal ideations: Secondary | ICD-10-CM

## 2015-01-11 DIAGNOSIS — F10230 Alcohol dependence with withdrawal, uncomplicated: Secondary | ICD-10-CM

## 2015-01-11 MED ORDER — HYDROXYZINE HCL 25 MG PO TABS
25.0000 mg | ORAL_TABLET | Freq: Four times a day (QID) | ORAL | Status: AC | PRN
Start: 2015-01-11 — End: 2015-01-14

## 2015-01-11 MED ORDER — LORAZEPAM 1 MG PO TABS
1.0000 mg | ORAL_TABLET | Freq: Three times a day (TID) | ORAL | Status: AC
  Administered 2015-01-12 (×3): 1 mg via ORAL
  Filled 2015-01-11 (×3): qty 1

## 2015-01-11 MED ORDER — ADULT MULTIVITAMIN W/MINERALS CH
1.0000 | ORAL_TABLET | Freq: Every day | ORAL | Status: DC
  Administered 2015-01-11 – 2015-01-14 (×4): 1 via ORAL
  Filled 2015-01-11 (×7): qty 1

## 2015-01-11 MED ORDER — LORAZEPAM 1 MG PO TABS
1.0000 mg | ORAL_TABLET | Freq: Four times a day (QID) | ORAL | Status: AC
  Administered 2015-01-11 (×4): 1 mg via ORAL
  Filled 2015-01-11 (×4): qty 1

## 2015-01-11 MED ORDER — VORTIOXETINE HBR 5 MG PO TABS
10.0000 mg | ORAL_TABLET | Freq: Every day | ORAL | Status: DC
  Administered 2015-01-12: 10 mg via ORAL
  Filled 2015-01-11 (×2): qty 2

## 2015-01-11 MED ORDER — LOPERAMIDE HCL 2 MG PO CAPS: 2.0000 mg | ORAL_CAPSULE | ORAL | Status: AC | PRN

## 2015-01-11 MED ORDER — LORAZEPAM 1 MG PO TABS
1.0000 mg | ORAL_TABLET | Freq: Two times a day (BID) | ORAL | Status: AC
  Administered 2015-01-13 (×2): 1 mg via ORAL
  Filled 2015-01-11 (×2): qty 1

## 2015-01-11 MED ORDER — ONDANSETRON 4 MG PO TBDP: 4.0000 mg | ORAL_TABLET | Freq: Four times a day (QID) | ORAL | Status: AC | PRN

## 2015-01-11 MED ORDER — LORAZEPAM 1 MG PO TABS
1.0000 mg | ORAL_TABLET | Freq: Four times a day (QID) | ORAL | Status: AC | PRN
Start: 2015-01-11 — End: 2015-01-14

## 2015-01-11 MED ORDER — TRAZODONE HCL 100 MG PO TABS
100.0000 mg | ORAL_TABLET | Freq: Every evening | ORAL | Status: DC | PRN
  Administered 2015-01-11 – 2015-01-12 (×2): 100 mg via ORAL
  Filled 2015-01-11 (×8): qty 1

## 2015-01-11 MED ORDER — LORAZEPAM 1 MG PO TABS
1.0000 mg | ORAL_TABLET | Freq: Every day | ORAL | Status: AC
  Administered 2015-01-14: 1 mg via ORAL
  Filled 2015-01-11 (×2): qty 1

## 2015-01-11 MED ORDER — ZIPRASIDONE HCL 80 MG PO CAPS
80.0000 mg | ORAL_CAPSULE | Freq: Every evening | ORAL | Status: DC
  Administered 2015-01-11: 80 mg via ORAL
  Filled 2015-01-11 (×2): qty 1

## 2015-01-11 MED ORDER — ACAMPROSATE CALCIUM 333 MG PO TBEC
666.0000 mg | DELAYED_RELEASE_TABLET | Freq: Three times a day (TID) | ORAL | Status: DC
  Administered 2015-01-11 – 2015-01-14 (×9): 666 mg via ORAL
  Filled 2015-01-11 (×15): qty 2

## 2015-01-11 NOTE — Progress Notes (Signed)
Recreation Therapy Notes  Animal-Assisted Activity (AAA) Program Checklist/Progress Notes Patient Eligibility Criteria Checklist & Daily Group note for Rec Tx Intervention  Date: 12.13.2016  Time: 2:45pm Location: [address]    AAA/T Program Assumption of Risk Form signed by Patient/ or Parent Legal Guardian yes  Patient is free of allergies or sever asthma yes  Patient reports no fear of animals yes  Patient reports no history of cruelty to animals yes  Patient understands his/her participation is voluntary yes  Patient washes hands before animal contact yes  Patient washes hands after animal contact yes  Behavioral Response: Appropriate   Education: Hand Washing, Appropriate Animal Interaction   Education Outcome: Acknowledges education.   Clinical Observations/Feedback: Patient engaged appropriately in pet therapy session, interacting with therapy dog and peers appropriately during session.   Marykay Lex April Carlyon, LRT/CTRS  Malik Edwards L 01/11/2015 3:17 PM

## 2015-01-11 NOTE — Progress Notes (Signed)
Patient visible in milieu, primarily in dayroom however minimal interaction with peers or staff. Working in [employer]. Affect is blank, mood anxious and depressed. Patient slow to respond during conversation. Unclear if this is thought blocking or psychomotor retardation as ambulation is slowed as well. He rates his depression and hopelessness both at a 9/10, anxiety at a 10/10. Denying pain, physical problems. Reports his goal for today is to "change meds." Patient offered emotional support. Medicated per orders and education provided. EKG obtained and reviewed by Chipper Herb, NP. CIWA at noon is a "0" with VSS. "I never have withdrawal. I don't drink every day. I just went on a 6 day binge." Denies SI/HI and remains safe on level III obs. Malik Edwards
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
## 2015-01-11 NOTE — BHH Group Notes (Signed)
B[facility] Group Notes:  (Nursing/MHT/Case Management/Adjunct)  Date:  01/11/2015  Time:  0900  Type of Therapy:  Nurse Education  Participation Level:  Minimal  Participation Quality:  Minimal  Affect:  Flat  Cognitive:  Oriented  Insight:  Limited  Engagement in Group:  Limited  Modes of Intervention:  Discussion, Education and Support  Summary of Progress/Problems: Patient attended nursing ed group however did not choose to share.   Merian Capron [facility] 01/11/2015, 0930

## 2015-01-11 NOTE — BHH Group Notes (Signed)
BHH LCSW Group Therapy  01/11/2015 1:01 PM  Type of Therapy:  Group Therapy  Participation Level:  Active  Participation Quality:  Attentive  Affect:  Appropriate  Cognitive:  Alert and Oriented  Insight:  Improving  Engagement in Therapy:  Improving  Modes of Intervention:  Confrontation, Discussion, Education, Exploration, Rapport Building, Dance movement psychotherapist, Socialization and Support  Summary of Progress/Problems: MHA Speaker came to talk about his personal journey with substance abuse and addiction. The pt processed ways by which to relate to the speaker. MHA speaker provided handouts and educational information pertaining to groups and services offered by the [facility].   Smart, Malik Scheuring LCSW 01/11/2015, 1:01 PM

## 2015-01-11 NOTE — Progress Notes (Signed)
D: Patient in the dayroom on approach.  Patient states he had a good day.  Patient states his goal for today was to try to talk to other on the unit.  Patient states he was able to achieve his goal.  Patient was able to shave and shower tonight and he states he feels good after this.  Patient states his withdrawal symptoms are minimal. Patient denies SI/HI and denies AVH. A: Staff to monitor Q 15 mins for safety.  Encouragement and support offered.  Scheduled medications administered per orders.   R: Patient remains safe on the unit.  Patient attended group tonight.  Patient visible on the unit and interacting with peers.  Patient taking administered medications.
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
## 2015-01-11 NOTE — Progress Notes (Signed)
Attended group 

## 2015-01-12 MED ORDER — LAMOTRIGINE 25 MG PO TABS
25.0000 mg | ORAL_TABLET | Freq: Every day | ORAL | Status: DC
  Administered 2015-01-12 – 2015-01-14 (×3): 25 mg via ORAL
  Filled 2015-01-12 (×6): qty 1

## 2015-01-12 MED ORDER — VENLAFAXINE HCL ER 37.5 MG PO CP24
37.5000 mg | ORAL_CAPSULE | Freq: Every day | ORAL | Status: DC
  Administered 2015-01-13 – 2015-01-14 (×2): 37.5 mg via ORAL
  Filled 2015-01-12 (×3): qty 1

## 2015-01-12 MED ORDER — QUETIAPINE FUMARATE 100 MG PO TABS: 100.0000 mg | ORAL_TABLET | Freq: Every day | ORAL | Status: DC

## 2015-01-12 MED ORDER — QUETIAPINE FUMARATE 200 MG PO TABS
200.0000 mg | ORAL_TABLET | Freq: Every day | ORAL | Status: DC
  Administered 2015-01-12: 200 mg via ORAL
  Filled 2015-01-12 (×2): qty 1

## 2015-01-12 NOTE — Progress Notes (Signed)
Pt attended evening AA group. 

## 2015-01-12 NOTE — Progress Notes (Addendum)
[facility] MD Progress Note  01/12/2015 6:36 PM Malik Edwards  MRN:  [id number] Subjective:  Miner continues to have a hard time. He did not sleep well last night. He is having increased dissociative episodes. He wants off his previous medications as he states they are not helping.  Principal Problem: Severe recurrent major depression without psychotic features (HCC) Diagnosis:   Patient Active Problem List   Diagnosis Date Noted  . Severe recurrent major depression without psychotic features (HCC) [F33.2] 01/11/2015  . Schizoaffective disorder, unspecified (HCC) [F25.9] 01/10/2015  . Alcohol dependence with uncomplicated withdrawal (HCC) [F10.230] 01/10/2015  . OSA (obstructive sleep apnea) [G47.33] 01/22/2013  . Cellulitis of left lower leg [L03.116] 10/15/2012  . Edema of both legs [R60.0] 10/15/2012  . Tobacco use [Z72.0] 10/15/2012  . Atrial flutter with rapid ventricular response (HCC) [I48.92] 09/13/2012  . Cardiomyopathy- etiol uindetermined- EF reportedly 35-40% [I42.9] 09/13/2012  . HTN (hypertension) [I10] 09/13/2012  . Morbid obesity with BMI of 45.0-49.9, adult (HCC) [E66.01, Z68.42] 09/13/2012  . Transaminitis- ?fatty liver, worse when statin added [R74.0] 09/13/2012   Total Time spent with patient: 30 minutes  Past Psychiatric History: see admission H and P  Past Medical History:  Past Medical History  Diagnosis Date  . Hypertension   . Cardiomyopathy (HCC)   . Colitis   . Depression   . OSA (obstructive sleep apnea) 01/22/2013  . High cholesterol   . Anxiety     Past Surgical History  Procedure Laterality Date  . Cardioversion N/A 09/15/2012    Procedure: CARDIOVERSION;  Surgeon: Chrystie Nose, MD;  Location: [facility] OR;  Service: Cardiovascular;  Laterality: N/A;   Family History:  Family History  Problem Relation Age of Onset  . Healthy Mother   . Healthy Father    Family Psychiatric  History: see Admission H and P Social History:  History  Alcohol Use   . 6.0 oz/week  . 12 Standard drinks or equivalent per week    Comment: pt stated his dad made him drink when he was 39 yrs old, moonshine until he was drunk     History  Drug Use No    Comment: denied all drug use    Social History   Social History  . Marital Status: Married    Spouse Name: Tyrome Donatelli  . Number of Children: 0  . Years of Education: Assoc. Deg   Occupational History  .  Other    [employer]   Social History Main Topics  . Smoking status: Current Every Day Smoker -- 1.00 packs/day for 20 years    Types: Cigarettes  . Smokeless tobacco: Never Used  . Alcohol Use: 6.0 oz/week    12 Standard drinks or equivalent per week     Comment: pt stated his dad made him drink when he was 64 yrs old, moonshine until he was drunk  . Drug Use: No     Comment: denied all drug use  . Sexual Activity: Yes    Birth Control/ Protection: None   Other Topics Concern  . None   Social History Narrative   Patient lives at home with spouse.   Caffeine Use: 1-2 cups daily   Additional Social History:    Pain Medications: none Prescriptions: lasix   norvasc   trintillix   geodon   baby aspirin   multivitamin   vitamin D Over the Counter: baby aspirin     multivitamin   vitamin D History of alcohol / drug  use?: Yes Longest period of sobriety (when/how long): one yr Negative Consequences of Use: Work / Programmer, multimedia, Copywriter, advertising relationships, Surveyor, quantity Withdrawal Symptoms: Other (Comment) (anxiety   depression) Name of Substance 1: alcohol 1 - Age of First Use: 39 yrs old 1 - Amount (size/oz): moonshine until he was drunk 1 - Frequency: whenever his dad told him to drink 1 - Duration: years 1 - Last Use / Amount: 01/09/2015                  Sleep: Poor  Appetite:  Poor  Current Medications: Current Facility-Administered Medications  Medication Dose Route Frequency Provider Last Rate Last Dose  . acamprosate (CAMPRAL) tablet 666 mg  666 mg Oral TID WC Rachael Fee,  MD   666 mg at 01/12/15 1640  . acetaminophen (TYLENOL) tablet 650 mg  650 mg Oral Q6H PRN Charm Rings, NP      . albuterol (PROVENTIL HFA;VENTOLIN HFA) 108 (90 BASE) MCG/ACT inhaler 2 puff  2 puff Inhalation Q4H PRN Charm Rings, NP      . alum & mag hydroxide-simeth (MAALOX/MYLANTA) 200-200-20 MG/5ML suspension 30 mL  30 mL Oral Q4H PRN Charm Rings, NP      . amLODipine (NORVASC) tablet 2.5 mg  2.5 mg Oral Daily Charm Rings, NP   2.5 mg at 01/12/15 0827  . aspirin EC tablet 162 mg  162 mg Oral Daily Charm Rings, NP   162 mg at 01/12/15 0827  . busPIRone (BUSPAR) tablet 10 mg  10 mg Oral BID Beau Fanny, FNP   10 mg at 01/12/15 1640  . carvedilol (COREG) tablet 25 mg  25 mg Oral BID Charm Rings, NP   25 mg at 01/12/15 1640  . cholecalciferol (VITAMIN D) tablet 1,000 Units  1,000 Units Oral Daily Charm Rings, NP   1,000 Units at 01/12/15 0827  . furosemide (LASIX) tablet 40 mg  40 mg Oral Daily Charm Rings, NP   40 mg at 01/12/15 0827  . hydrOXYzine (ATARAX/VISTARIL) tablet 25 mg  25 mg Oral Q6H PRN Rachael Fee, MD      . lamoTRIgine (LAMICTAL) tablet 25 mg  25 mg Oral Daily Rachael Fee, MD   25 mg at 01/12/15 1314  . lisinopril (PRINIVIL,ZESTRIL) tablet 10 mg  10 mg Oral Daily Charm Rings, NP   10 mg at 01/12/15 0827  . loperamide (IMODIUM) capsule 2-4 mg  2-4 mg Oral PRN Rachael Fee, MD      . LORazepam (ATIVAN) tablet 1 mg  1 mg Oral Q6H PRN Rachael Fee, MD      . Melene Muller ON 01/13/2015] LORazepam (ATIVAN) tablet 1 mg  1 mg Oral BID Rachael Fee, MD       Followed by  . [START ON 01/14/2015] LORazepam (ATIVAN) tablet 1 mg  1 mg Oral Daily Rachael Fee, MD      . magnesium hydroxide (MILK OF MAGNESIA) suspension 30 mL  30 mL Oral Daily PRN Charm Rings, NP      . multivitamin with minerals tablet 1 tablet  1 tablet Oral Daily Rachael Fee, MD   1 tablet at 01/12/15 0827  . nicotine (NICODERM CQ - dosed in mg/24 hours) patch 21 mg  21 mg Transdermal Daily  Rachael Fee, MD   21 mg at 01/12/15 0827  . ondansetron (ZOFRAN-ODT) disintegrating tablet 4 mg  4 mg Oral Q6H PRN Madie Reno  A Jalea Bronaugh, MD      . potassium chloride (K-DUR,KLOR-CON) CR tablet 10 mEq  10 mEq Oral Daily Charm Rings, NP   10 mEq at 01/12/15 0827  . QUEtiapine (SEROQUEL) tablet 200 mg  200 mg Oral QHS Rachael Fee, MD      . traZODone (DESYREL) tablet 100 mg  100 mg Oral QHS,MR X 1 Kerry Hough, PA-C   100 mg at 01/11/15 2225    Lab Results: No results found for this or any previous visit (from the past 48 hour(s)).  Physical Findings: AIMS: Facial and Oral Movements Muscles of Facial Expression: None, normal Lips and Perioral Area: None, normal Jaw: None, normal Tongue: None, normal,Extremity Movements Upper (arms, wrists, hands, fingers): None, normal Lower (legs, knees, ankles, toes): None, normal, Trunk Movements Neck, shoulders, hips: None, normal, Overall Severity Severity of abnormal movements (highest score from questions above): None, normal Incapacitation due to abnormal movements: None, normal Patient's awareness of abnormal movements (rate only patient's report): No Awareness, Dental Status Current problems with teeth and/or dentures?: No Does patient usually wear dentures?: No  CIWA:  CIWA-Ar Total: 0 COWS:  COWS Total Score: 1  Musculoskeletal: Strength & Muscle Tone: within normal limits Gait & Station: normal Patient leans: normal  Psychiatric Specialty Exam: Review of Systems  Constitutional: Positive for malaise/fatigue.  HENT: Negative.   Eyes: Negative.   Respiratory: Negative.   Cardiovascular: Negative.   Gastrointestinal: Negative.   Genitourinary: Negative.   Musculoskeletal: Negative.   Skin: Negative.   Neurological: Positive for weakness.  Endo/Heme/Allergies: Negative.   Psychiatric/Behavioral: Positive for depression and substance abuse. The patient is nervous/anxious.     Blood pressure 131/75, pulse 89, temperature 98 F  (36.7 C), temperature source Oral, resp. rate 18, height  (1.753 m), weight 124.286 kg (274 lb), SpO2 96 %.Body mass index is 40.44 kg/(m^2).  General Appearance: Fairly Groomed  Patent attorney::  Fair  Speech:  Clear and Coherent and not spontaneous reserved and guarded  Volume:  Decreased  Mood:  Depressed and Dysphoric  Affect:  Depressed and Restricted  Thought Process:  Coherent and Goal Directed  Orientation:  Full (Time, Place, and Person)  Thought Content:  symptoms events worries concerns  Suicidal Thoughts:  No  Homicidal Thoughts:  No  Memory:  Immediate;   Fair Recent;   Fair Remote;   Fair  Judgement:  Fair  Insight:  Present and Shallow  Psychomotor Activity:  Decreased and Psychomotor Retardation  Concentration:  Fair  Recall:  Poor  Fund of Knowledge:Fair  Language: Fair  Akathisia:  No  Handed:  Right  AIMS (if indicated):     Assets:  Desire for Improvement Housing Social Support Talents/Skills Vocational/Educational  ADL's:  Intact  Cognition: WNL  Sleep:  Number of Hours: 6.75   Treatment Plan Summary: Daily contact with patient to assess and evaluate symptoms and progress in treatment and Medication management Supportive approach/coping skills Alcohol dependence; will continue the alcohol detox protocol/work a relapse prevention plan Depression; will D/C the Brintelix, will start Effexor XR 37, 5 mg in AM Ruminative thinking/insomnia: will D/C the Geodon and start a trial with  Seroquel 200 mg HS Dissociative symptoms/mood instability; will start Lamictal 25 mg with plans to optimize dose response Will work with CBT/mindfulness Najla Aughenbaugh A 01/12/2015, 6:36 PM

## 2015-01-12 NOTE — Plan of Care (Signed)
Problem: Alteration in mood & ability to function due to Goal: STG-Patient will attend groups Outcome: Progressing Patient attends groups though participation is minimal. Goal: STG-Patient will comply with prescribed medication regimen (Patient will comply with prescribed medication regimen)  Outcome: Progressing Patient has been med compliant.

## 2015-01-12 NOTE — Clinical Social Work Note (Signed)
Per pt request, CSW contacted pt's employer [employer] [phone] notifying him of hospitalization.   Smart, Adrik Khim, LCSW 01/12/2015, 12:16 PM

## 2015-01-12 NOTE — Progress Notes (Signed)
Recreation Therapy Notes  Date: 12.14.2016  Time: 9:30am Location: [address] Group Room   Group Topic: Stress Management  Goal Area(s) Addresses:  Patient will actively participate in stress management techniques presented during session.   Behavioral Response: Appropriate   Intervention: Stress management techniques  Activity :  Deep Breathing and Guided Imagery. LRT provided instruction and demonstration on practice of Guided Imagery. Technique was coupled with deep breathing.   Education:  Stress Management, Discharge Planning.   Education Outcome: Acknowledges education  Clinical Observations/Feedback: Patient actively engaged in technique introduced, expressed no concerns and demonstrated ability to practice independently post d/c.   Marykay Lex Guelda Batson, LRT/CTRS        Malik Edwards L 01/12/2015 2:13 PM

## 2015-01-12 NOTE — [facility] Group Notes (Signed)
[facility] LCSW Group Therapy  01/12/2015 3:22 PM  Type of Therapy:  Group Therapy  Participation Level: minimal   Participation Quality:  Inattentive  Affect:  Depressed and Flat  Cognitive:  Lacking  Insight:  Limited  Engagement in Therapy:  Limited  Modes of Intervention:  Confrontation, Discussion, Education, Exploration, Problem-solving, Rapport Building, Socialization and Support  Summary of Progress/Problems: Emotion Regulation: This group focused on both positive and negative emotion identification and allowed group members to process ways to identify feelings, regulate negative emotions, and find healthy ways to manage internal/external emotions. Group members were asked to reflect on a time when their reaction to an emotion led to a negative outcome and explored how alternative responses using emotion regulation would have benefited them. Group members were also asked to discuss a time when emotion regulation was utilized when a negative emotion was experienced.   Smart, Kameah Rawl LCSW 01/12/2015, 3:22 PM

## 2015-01-12 NOTE — Progress Notes (Signed)
D: Pt minimally interacts with Clinical research associate. Pt presents with a blank affect. Pt also presents slow in his motor activities. Pt denied any SI/HI/AVH. Pt visible within milieu but with minimal interactions with others.  A: Writer administered scheduled and prn medications to pt, per MD orders. Continued support and availability as needed was extended to this pt. Staff continues to monitor pt with q41min checks.  R: No adverse drug reactions noted. Pt receptive to treatment. Pt remains safe at this time.

## 2015-01-12 NOTE — Progress Notes (Signed)
Patient up and visible in milieu, typically seated in dayroom. Interaction minimal. Patient remains slow to respond however did joke some with this Clinical research associate. Has brightened slightly today however continues to rate depression, hopelessness and anxiety all at a 9/10. States his goal is to "change meds." Denies pain, physical problems. Medicated per orders. Emotional support and reassurance offered. Discussed with tx team, Denies SI/HI and remains safe on level III obs. Lawrence Marseilles

## 2015-01-12 NOTE — [facility] Group Notes (Signed)
[facility] LCSW Aftercare Discharge Planning Group Note   01/12/2015 9:40 AM  Participation Quality:  Minimal   Mood/Affect:  Depressed and Flat  Depression Rating:  9  Anxiety Rating:  9  Thoughts of Suicide:  No Will you contract for safety?   NA  Current AVH:  No  Plan for Discharge/Comments:  Pt reports that he fell asleep but could not stay asleep. Pt did not initially respond to his name and had to be repeated several times. Pt has follow-up at the [facility] in place and does not want to go back to [facility].   Transportation Means: wife  Supports: wife  Counselling psychologist, [place] LCSW

## 2015-01-13 DIAGNOSIS — F1020 Alcohol dependence, uncomplicated: Secondary | ICD-10-CM

## 2015-01-13 DIAGNOSIS — F4310 Post-traumatic stress disorder, unspecified: Secondary | ICD-10-CM

## 2015-01-13 DIAGNOSIS — F332 Major depressive disorder, recurrent severe without psychotic features: Principal | ICD-10-CM

## 2015-01-13 DIAGNOSIS — Z6841 Body Mass Index (BMI) 40.0 and over, adult: Secondary | ICD-10-CM

## 2015-01-13 DIAGNOSIS — F251 Schizoaffective disorder, depressive type: Secondary | ICD-10-CM | POA: Diagnosis present

## 2015-01-13 MED ORDER — QUETIAPINE FUMARATE 400 MG PO TABS
400.0000 mg | ORAL_TABLET | Freq: Every day | ORAL | Status: DC
  Administered 2015-01-13: 400 mg via ORAL
  Filled 2015-01-13 (×3): qty 1

## 2015-01-13 MED ORDER — BUSPIRONE HCL 10 MG PO TABS
10.0000 mg | ORAL_TABLET | Freq: Three times a day (TID) | ORAL | Status: DC
  Administered 2015-01-13 – 2015-01-14 (×4): 10 mg via ORAL
  Filled 2015-01-13 (×9): qty 1

## 2015-01-13 MED ORDER — TRAZODONE HCL 100 MG PO TABS: 100.0000 mg | ORAL_TABLET | Freq: Every evening | ORAL | Status: DC | PRN

## 2015-01-13 NOTE — Progress Notes (Signed)
Patient ID: SCHNEIDER GAVINS, male   DOB: 1975-04-15, 39 y.o.   MRN: [id number]  Pt currently presents with a flat affect and irritable behavior. Per self inventory, pt rates depression at a 9, hopelessness 9 and anxiety 10. Pt's daily goal is to meds" and they intend to do so by "talk to [place]." Pt reports poor sleep, a fair appetite and poor concentration. Pt concerns about medications including his Effexor. Pt forwards little to Clinical research associate.   Pt provided with medications per providers orders. Pt's labs and vitals were monitored throughout the day. Pt supported emotionally and encouraged to express concerns and questions. Pt educated on medications, pt offered a handout on Effexor but refused.  Pt's safety ensured with 15 minute and environmental checks. Pt currently denies SI/HI and A/V hallucinations. Pt verbally agrees to seek staff if SI/HI or A/VH occurs and to consult with staff before acting on these thoughts. Will continue POC.

## 2015-01-13 NOTE — Progress Notes (Signed)
Patient ID: YOUSSOUF SATTLER, male   DOB: 1975-09-19, 39 y.o.   MRN: [id number] Adult Psychoeducational Group Note  Date:  01/13/2015 Time: 09:15AM  Group Topic/Focus:  Making Healthy Choices:   The focus of this group is to help patients identify negative/unhealthy choices they were using prior to admission and identify positive/healthier coping strategies to replace them upon discharge.  Participation Level:  Minimal  Participation Quality:  Appropriate  Affect:  Appropriate  Cognitive:  Appropriate  Insight: Appropriate  Engagement in Group:  Engaged  Modes of Intervention:  Activity, Discussion, Education and Support  Additional Comments:  Pt able to identify one healthy lifestyle/leisure activity in which he can participate post discharge. Pt supportive and encouraging of others during discussion.   Aurora Mask 01/13/2015, 12:25 AM

## 2015-01-13 NOTE — [facility] Suicide Risk Assessment (Signed)
[facility] INPATIENT:  Family/Significant Other Suicide Prevention Education  Suicide Prevention Education:  Education Completed; Antwan Tritten (pt's wife) [phone] has been identified by the patient as the family member/significant other with whom the patient will be residing, and identified as the person(s) who will aid the patient in the event of a mental health crisis (suicidal ideations/suicide attempt).  With written consent from the patient, the family member/significant other has been provided the following suicide prevention education, prior to the and/or following the discharge of the patient.  The suicide prevention education provided includes the following:  Suicide risk factors  Suicide prevention and interventions  National Suicide Hotline telephone number  [facility] assessment telephone number  [facility] Emergency Assistance 911  [facility] and/or Residential Mobile Crisis Unit telephone number  Request made of family/significant other to:  Remove weapons (e.g., guns, rifles, knives), all items previously/currently identified as safety concern.    Remove drugs/medications (over-the-counter, prescriptions, illicit drugs), all items previously/currently identified as a safety concern.  The family member/significant other verbalizes understanding of the suicide prevention education information provided.  The family member/significant other agrees to remove the items of safety concern listed above.  Smart, Tameya Kuznia LCSW 01/13/2015, 3:35 PM
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
## 2015-01-13 NOTE — Progress Notes (Signed)
Patient did attend the evening karaoke group. Pt was engaged and supportive but did not participate by singing a song.    

## 2015-01-13 NOTE — Plan of Care (Signed)
Problem: Diagnosis: Increased Risk For Suicide Attempt Goal: STG-Patient Will Comply With Medication Regime Outcome: Progressing Pt compliant with taking scheduled medications as prescribed.

## 2015-01-13 NOTE — [facility] Group Notes (Signed)
[facility] LCSW Group Therapy  01/13/2015 11:25 AM  Type of Therapy:  Group Therapy  Participation Level:  Active  Participation Quality:  Attentive  Affect:  Appropriate  Cognitive:  Oriented  Insight:  Improving  Engagement in Therapy:  Improving  Modes of Intervention:  Confrontation, Discussion, Education, Exploration, Problem-solving, Rapport Building, Socialization and Support  Summary of Progress/Problems:  Finding Balance in Life. Today's group focused on defining balance in one's own words, identifying things that can knock one off balance, and exploring healthy ways to maintain balance in life. Group members were asked to provide an example of a time when they felt off balance, describe how they handled that situation,and process healthier ways to regain balance in the future. Group members were asked to share the most important tool for maintaining balance that they learned while at [facility] and how they plan to apply this method after discharge. Greyer was attentive and engaged during today's processing group. He stated that "a complete overhaul in his mental health care and being more honest" were things needed that would improve his sense of balance. He plans to follow-up at the [facility] and would like to get all of this mental health services in one place. He reports having a supportive wife "which helps."   Smart, Destan Franchini LCSW 01/13/2015, 11:25 AM

## 2015-01-14 LAB — TSH: TSH: 3.08 u[IU]/mL (ref 0.350–4.500)

## 2015-01-14 LAB — LIPID PANEL
CHOL/HDL RATIO: 5 ratio
Cholesterol: 218 mg/dL — ABNORMAL HIGH (ref 0–200)
HDL: 44 mg/dL (ref >40)
LDL CALC: 130 mg/dL — AB (ref 0–99)
TRIGLYCERIDES: 221 mg/dL — AB (ref <150)
VLDL: 44 mg/dL — AB (ref 0–40)

## 2015-01-14 MED ORDER — BUSPIRONE HCL 10 MG PO TABS: 10.0000 mg | ORAL_TABLET | Freq: Three times a day (TID) | ORAL | Status: DC

## 2015-01-14 MED ORDER — ALBUTEROL SULFATE HFA 108 (90 BASE) MCG/ACT IN AERS: 2.0000 | INHALATION_SPRAY | RESPIRATORY_TRACT | Status: DC | PRN

## 2015-01-14 MED ORDER — TRAZODONE HCL 100 MG PO TABS: 100.0000 mg | ORAL_TABLET | Freq: Every evening | ORAL | Status: DC | PRN

## 2015-01-14 MED ORDER — QUETIAPINE FUMARATE 400 MG PO TABS: 400.0000 mg | ORAL_TABLET | Freq: Every day | ORAL | Status: DC

## 2015-01-14 MED ORDER — AMLODIPINE BESYLATE 2.5 MG PO TABS
2.5000 mg | ORAL_TABLET | Freq: Every day | ORAL | Status: DC
Start: 2015-01-14 — End: 2015-01-14

## 2015-01-14 MED ORDER — NICOTINE 21 MG/24HR TD PT24: 21.0000 mg | MEDICATED_PATCH | Freq: Every day | TRANSDERMAL | Status: DC

## 2015-01-14 MED ORDER — LAMOTRIGINE 25 MG PO TABS: 25.0000 mg | ORAL_TABLET | Freq: Every day | ORAL | Status: DC

## 2015-01-14 MED ORDER — AMLODIPINE BESYLATE 2.5 MG PO TABS: 2.5000 mg | ORAL_TABLET | Freq: Every day | ORAL | Status: DC

## 2015-01-14 MED ORDER — ASPIRIN 162 MG PO TBEC: 162.0000 mg | DELAYED_RELEASE_TABLET | Freq: Every day | ORAL | Status: DC

## 2015-01-14 MED ORDER — LISINOPRIL 10 MG PO TABS: 10.0000 mg | ORAL_TABLET | Freq: Every day | ORAL | Status: DC

## 2015-01-14 MED ORDER — POTASSIUM CHLORIDE CRYS ER 10 MEQ PO TBCR: 10.0000 meq | EXTENDED_RELEASE_TABLET | Freq: Every day | ORAL | Status: DC

## 2015-01-14 MED ORDER — CARVEDILOL 25 MG PO TABS: 25.0000 mg | ORAL_TABLET | Freq: Two times a day (BID) | ORAL | Status: DC

## 2015-01-14 MED ORDER — ONE-DAILY MULTI VITAMINS PO TABS: 1.0000 | ORAL_TABLET | Freq: Every day | ORAL | Status: DC

## 2015-01-14 MED ORDER — VENLAFAXINE HCL ER 75 MG PO CP24: 75.0000 mg | ORAL_CAPSULE | Freq: Every day | ORAL | Status: DC

## 2015-01-14 MED ORDER — VENLAFAXINE HCL ER 75 MG PO CP24
75.0000 mg | ORAL_CAPSULE | Freq: Every day | ORAL | Status: DC
  Filled 2015-01-14 (×2): qty 1

## 2015-01-14 MED ORDER — FUROSEMIDE 40 MG PO TABS: ORAL_TABLET | ORAL | Status: DC

## 2015-01-14 MED ORDER — LOPERAMIDE HCL 2 MG PO CAPS: 4.0000 mg | ORAL_CAPSULE | Freq: Two times a day (BID) | ORAL | Status: DC

## 2015-01-14 MED ORDER — VITAMIN D 1000 UNITS PO TABS: 2000.0000 [IU] | ORAL_TABLET | Freq: Every day | ORAL | Status: DC

## 2015-01-14 MED ORDER — ACAMPROSATE CALCIUM 333 MG PO TBEC: 666.0000 mg | DELAYED_RELEASE_TABLET | Freq: Three times a day (TID) | ORAL | Status: DC

## 2015-01-14 MED ORDER — POTASSIUM CHLORIDE CRYS ER 10 MEQ PO TBCR
10.0000 meq | EXTENDED_RELEASE_TABLET | Freq: Every day | ORAL | Status: DC
Start: 2015-01-14 — End: 2015-08-30

## 2015-01-14 MED ORDER — VENLAFAXINE HCL ER 75 MG PO CP24
75.0000 mg | ORAL_CAPSULE | Freq: Every day | ORAL | Status: DC
Start: 2015-01-15 — End: 2015-03-23

## 2015-01-14 NOTE — Progress Notes (Signed)
Patient ID: Malik Edwards, male   DOB: 03-26-1975, 39 y.o.   MRN: [id number] Patient discharged to home/self care.  Patient accompanied home with a ride.  Patient received discharge information and acknowledged understanding of information received.   Patient denied SI, HI and AVH upon discharge.  Patient had a flat affect upon discharge, but stated he was eager to leave.

## 2015-01-14 NOTE — Progress Notes (Signed)
  [facility] Adult Case Management Discharge Plan :  Will you be returning to the same living situation after discharge:  Yes,  home At discharge, do you have transportation home?: Yes,  wife Do you have the ability to pay for your medications: Yes,  private insurance  Release of information consent forms completed and submitted to medical records by CSW.  Patient to Follow up at: Follow-up Information    Follow up with [facility]  On 01/17/2015.   Why:  Substance Abuse Intensive Outpatient Group from 6:30PM-9:00PM Monday.   Contact information:   [address] [place], [place] [address] Phone: [phone] Fax: [phone]       Next level of care provider has access to [facility] Link:no  Patient denies SI/HI: Yes,  during group/self report.     Safety Planning and Suicide Prevention discussed: Yes,  SPE completed with pt's wife. SPI pamphlet and mobile crisis information provided to pt.   Have you used any form of tobacco in the last 30 days? (Cigarettes, Smokeless Tobacco, Cigars, and/or Pipes): Yes  Has patient been referred to the Quitline?: Patient refused referral  Patient has been referred for addiction treatment: Yes-see above  Smart, Marguerita Stapp LCSW 01/14/2015, 10:17 AM

## 2015-01-14 NOTE — [facility] Suicide Risk Assessment (Signed)
[facility] Discharge Suicide Risk Assessment   Demographic Factors:  Male and Caucasian  Total Time spent with patient: 30 minutes  Musculoskeletal: Strength & Muscle Tone: within normal limits Gait & Station: normal Patient leans: N/A  Psychiatric Specialty Exam: Physical Exam  Review of Systems  Psychiatric/Behavioral: Positive for depression. The patient is nervous/anxious.   All other systems reviewed and are negative.   Blood pressure 135/91, pulse 102, temperature 98 F (36.7 C), temperature source Oral, resp. rate 24, height 5\' 9"  (1.753 m), weight 124.286 kg (274 lb), SpO2 96 %.Body mass index is 40.44 kg/(m^2).  General Appearance: Casual  Eye Contact::  Fair  Speech:  Clear and Coherent409  Volume:  Normal  Mood:  Anxious and Depressed improving  Affect:  Congruent  Thought Process:  Coherent  Orientation:  Full (Time, Place, and Person)  Thought Content:  WDL  Suicidal Thoughts:  No  Homicidal Thoughts:  No  Memory:  Immediate;   Fair Recent;   Fair Remote;   Fair  Judgement:  Fair  Insight:  Fair  Psychomotor Activity:  Normal  Concentration:  Fair  Recall:  [employer] of Knowledge:Fair  Language: Fair  Akathisia:  No  Handed:  Right  AIMS (if indicated):     Assets:  Communication Skills Desire for Improvement Social Support  Sleep:  Number of Hours: 6.25  Cognition: WNL  ADL's:  Intact   Have you used any form of tobacco in the last 30 days? (Cigarettes, Smokeless Tobacco, Cigars, and/or Pipes): Yes  Has this patient used any form of tobacco in the last 30 days? (Cigarettes, Smokeless Tobacco, Cigars, and/or Pipes) Yes, A prescription for an FDA-approved tobacco cessation medication was offered at discharge and the patient refused  Mental Status Per Nursing Assessment::   On Admission:  Self-harm thoughts  Current Mental Status by Physician: denies SI/HI/AH/VH  Loss Factors: NA  Historical Factors: Impulsivity  Risk Reduction Factors:    Living with another person, especially a relative and Positive social support  Continued Clinical Symptoms:  Alcohol/Substance Abuse/Dependencies Previous Psychiatric Diagnoses and Treatments  Cognitive Features That Contribute To Risk:  Polarized thinking    Suicide Risk:  Minimal: No identifiable suicidal ideation.  Patients presenting with no risk factors but with morbid ruminations; may be classified as minimal risk based on the severity of the depressive symptoms  Principal Problem: MDD (major depressive disorder), recurrent severe, without psychosis [facility]) Discharge Diagnoses:  Patient Active Problem List   Diagnosis Date Noted  . Alcohol use disorder, moderate, dependence (HCC) [F10.20] 01/13/2015  . PTSD (post-traumatic stress disorder) [F43.10] 01/13/2015  . MDD (major depressive disorder), recurrent severe, without psychosis (HCC) [F33.2] 01/13/2015  . OSA (obstructive sleep apnea) [G47.33] 01/22/2013  . Tobacco use [Z72.0] 10/15/2012  . Atrial flutter with rapid ventricular response (HCC) [I48.92] 09/13/2012  . Cardiomyopathy- etiol uindetermined- EF reportedly 35-40% [I42.9] 09/13/2012  . HTN (hypertension) [I10] 09/13/2012  . Morbid obesity with BMI of 45.0-49.9, adult (HCC) [E66.01, Z68.42] 09/13/2012  . Transaminitis- ?fatty liver, worse when statin added [R74.0] 09/13/2012    Follow-up Information    Follow up with [facility]  On 01/17/2015.   Why:  Substance Abuse Intensive Outpatient Group from 6:30PM-9:00PM Monday.   Contact information:   [address] [place], [place] [address] Phone: [phone] Fax: [phone]       Plan Of Care/Follow-up recommendations:  Activity:  No restrictions Diet:  regular Tests:  as needed Other:  none  Is patient on multiple antipsychotic  therapies at discharge:  No   Has Patient had three or more failed trials of antipsychotic monotherapy by history:  No  Recommended Plan for Multiple Antipsychotic  Therapies: NA    Rosamond Andress MD 01/14/2015, 9:42 AM
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
## 2015-01-14 NOTE — Progress Notes (Addendum)
D: Pt presents flat in affect and depressed in mood. Pt endorses difficulty sleeping due to racing thoughts. Pt was aware of his increased dose of Seroquel for his ruminations. Pt is currently denying any SI/HI/AVH. Pt is visible within the milieu but with minimal interactions with others.  A: Writer administered scheduled medications to pt, per MD orders. Pt was encouraged to notify writer of any questions or concerns. Continued support and availability as needed was extended to this pt. Staff continues to monitor pt with q45min checks.  R: No adverse drug reactions noted. Pt receptive to treatment. Pt remains safe at this time.
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
## 2015-01-15 LAB — HEMOGLOBIN A1C
Hgb A1c MFr Bld: 5.5 % (ref 4.8–5.6)
MEAN PLASMA GLUCOSE: 111 mg/dL

## 2015-01-15 LAB — PROLACTIN: PROLACTIN: 14.8 ng/mL (ref 4.0–15.2)

## 2015-02-25 ENCOUNTER — Ambulatory Visit: Payer: BLUE CROSS/BLUE SHIELD | Admitting: Cardiovascular Disease

## 2015-03-10 ENCOUNTER — Other Ambulatory Visit: Payer: Self-pay | Admitting: Cardiovascular Disease

## 2015-03-10 NOTE — Telephone Encounter (Signed)
Rx request sent to pharmacy.  

## 2015-03-23 ENCOUNTER — Encounter: Payer: Self-pay | Admitting: Cardiovascular Disease

## 2015-03-23 ENCOUNTER — Ambulatory Visit (INDEPENDENT_AMBULATORY_CARE_PROVIDER_SITE_OTHER): Payer: 59 | Admitting: Cardiovascular Disease

## 2015-03-23 VITALS — BP 130/84 | HR 84 | Ht 69.0 in | Wt 283.0 lb

## 2015-03-23 DIAGNOSIS — I4892 Unspecified atrial flutter: Secondary | ICD-10-CM

## 2015-03-23 DIAGNOSIS — I10 Essential (primary) hypertension: Secondary | ICD-10-CM | POA: Diagnosis not present

## 2015-03-23 DIAGNOSIS — Z6841 Body Mass Index (BMI) 40.0 and over, adult: Secondary | ICD-10-CM

## 2015-03-23 DIAGNOSIS — F1020 Alcohol dependence, uncomplicated: Secondary | ICD-10-CM | POA: Diagnosis not present

## 2015-03-23 DIAGNOSIS — Z720 Tobacco use: Secondary | ICD-10-CM
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
## 2015-03-23 NOTE — Patient Instructions (Signed)
Your physician has recommended you make the following change in your medication:  Only take furosemide and potassium on an as needed basis.  Dr. Royann Shivers recommends that you schedule a follow-up appointment in: ONE YEAR

## 2015-05-07 ENCOUNTER — Other Ambulatory Visit: Payer: Self-pay | Admitting: Cardiovascular Disease

## 2015-05-09 NOTE — Telephone Encounter (Signed)
REFILL 

## 2015-06-03 ENCOUNTER — Other Ambulatory Visit: Payer: Self-pay | Admitting: Cardiovascular Disease

## 2015-06-03 NOTE — Telephone Encounter (Signed)
Rx Refill

## 2015-06-06 ENCOUNTER — Other Ambulatory Visit: Payer: Self-pay | Admitting: Cardiovascular Disease

## 2015-06-06 NOTE — Telephone Encounter (Signed)
Rx request sent to pharmacy.  

## 2015-08-20 ENCOUNTER — Encounter (HOSPITAL_COMMUNITY): Payer: Self-pay

## 2015-08-20 ENCOUNTER — Emergency Department (HOSPITAL_COMMUNITY): Payer: 59

## 2015-08-20 ENCOUNTER — Emergency Department (HOSPITAL_COMMUNITY)
Admission: EM | Admit: 2015-08-20 | Discharge: 2015-08-20 | Disposition: A | Discharge status: Home / Self Care | Payer: 59 | Attending: Emergency Medicine | Admitting: Emergency Medicine

## 2015-08-20 DIAGNOSIS — I10 Essential (primary) hypertension: Secondary | ICD-10-CM | POA: Diagnosis not present

## 2015-08-20 DIAGNOSIS — I429 Cardiomyopathy, unspecified: Secondary | ICD-10-CM | POA: Diagnosis not present

## 2015-08-20 DIAGNOSIS — F329 Major depressive disorder, single episode, unspecified: Secondary | ICD-10-CM | POA: Diagnosis not present

## 2015-08-20 DIAGNOSIS — Z79899 Other long term (current) drug therapy: Secondary | ICD-10-CM | POA: Insufficient documentation

## 2015-08-20 DIAGNOSIS — Z7982 Long term (current) use of aspirin: Secondary | ICD-10-CM | POA: Diagnosis not present

## 2015-08-20 DIAGNOSIS — F17210 Nicotine dependence, cigarettes, uncomplicated: Secondary | ICD-10-CM | POA: Diagnosis not present

## 2015-08-20 DIAGNOSIS — R404 Transient alteration of awareness: Secondary | ICD-10-CM

## 2015-08-20 DIAGNOSIS — F10920 Alcohol use, unspecified with intoxication, uncomplicated: Secondary | ICD-10-CM | POA: Insufficient documentation

## 2015-08-20 DIAGNOSIS — R4182 Altered mental status, unspecified: Secondary | ICD-10-CM | POA: Diagnosis present

## 2015-08-20 LAB — RAPID URINE DRUG SCREEN, HOSP PERFORMED
Amphetamines: NOT DETECTED
Barbiturates: NOT DETECTED
Benzodiazepines: NOT DETECTED
COCAINE: NOT DETECTED
OPIATES: NOT DETECTED
TETRAHYDROCANNABINOL: NOT DETECTED

## 2015-08-20 LAB — CBC WITH DIFFERENTIAL/PLATELET
BASOS ABS: 0.1 10*3/uL (ref 0.0–0.1)
BASOS PCT: 1 %
EOS ABS: 0.5 10*3/uL (ref 0.0–0.7)
Eosinophils Relative: 4 %
HEMATOCRIT: 42.5 % (ref 39.0–52.0)
Hemoglobin: 14.9 g/dL (ref 13.0–17.0)
Lymphocytes Relative: 41 %
Lymphs Abs: 4.4 10*3/uL — ABNORMAL HIGH (ref 0.7–4.0)
MCH: 31.2 pg (ref 26.0–34.0)
MCHC: 35.1 g/dL (ref 30.0–36.0)
MCV: 88.9 fL (ref 78.0–100.0)
MONO ABS: 0.9 10*3/uL (ref 0.1–1.0)
Monocytes Relative: 9 %
NEUTROS ABS: 4.8 10*3/uL (ref 1.7–7.7)
NEUTROS PCT: 45 %
PLATELETS: 269 10*3/uL (ref 150–400)
RBC: 4.78 MIL/uL (ref 4.22–5.81)
RDW: 13.4 % (ref 11.5–15.5)
WBC: 10.6 10*3/uL — ABNORMAL HIGH (ref 4.0–10.5)

## 2015-08-20 LAB — BASIC METABOLIC PANEL
ANION GAP: 9 (ref 5–15)
BUN: 19 mg/dL (ref 6–20)
CALCIUM: 8.9 mg/dL (ref 8.9–10.3)
CO2: 24 mmol/L (ref 22–32)
Chloride: 109 mmol/L (ref 101–111)
Creatinine, Ser: 0.76 mg/dL (ref 0.61–1.24)
GLUCOSE: 108 mg/dL — AB (ref 65–99)
Potassium: 4 mmol/L (ref 3.5–5.1)
Sodium: 142 mmol/L (ref 135–145)

## 2015-08-20 LAB — I-STAT CHEM 8, ED
BUN: 21 mg/dL — ABNORMAL HIGH (ref 6–20)
CHLORIDE: 110 mmol/L (ref 101–111)
Calcium, Ion: 1.1 mmol/L — ABNORMAL LOW (ref 1.13–1.30)
Creatinine, Ser: 1.1 mg/dL (ref 0.61–1.24)
Glucose, Bld: 107 mg/dL — ABNORMAL HIGH (ref 65–99)
HCT: 43 % (ref 39.0–52.0)
Hemoglobin: 14.6 g/dL (ref 13.0–17.0)
POTASSIUM: 3.9 mmol/L (ref 3.5–5.1)
SODIUM: 144 mmol/L (ref 135–145)
TCO2: 26 mmol/L (ref 0–100)

## 2015-08-20 LAB — I-STAT TROPONIN, ED: TROPONIN I, POC: 0 ng/mL (ref 0.00–0.08)

## 2015-08-20 LAB — ETHANOL: ALCOHOL ETHYL (B): 366 mg/dL — AB (ref <5)

## 2015-08-20 NOTE — Discharge Instructions (Signed)
Follow-up with your primary Dr. in the next week for a recheck.   Alcohol Intoxication Alcohol intoxication occurs when the amount of alcohol that a person has consumed impairs his or her ability to mentally and physically function. Alcohol directly impairs the normal chemical activity of the brain. Drinking large amounts of alcohol can lead to changes in mental function and behavior, and it can cause many physical effects that can be harmful.  Alcohol intoxication can range in severity from mild to very severe. Various factors can affect the level of intoxication that occurs, such as the person's age, gender, weight, frequency of alcohol consumption, and the presence of other medical conditions (such as diabetes, seizures, or heart conditions). Dangerous levels of alcohol intoxication may occur when people drink large amounts of alcohol in a short period (binge drinking). Alcohol can also be especially dangerous when combined with certain prescription medicines or "recreational" drugs. SIGNS AND SYMPTOMS Some common signs and symptoms of mild alcohol intoxication include:  Loss of coordination.  Changes in mood and behavior.  Impaired judgment.  Slurred speech. As alcohol intoxication progresses to more severe levels, other signs and symptoms will appear. These may include:  Vomiting.  Confusion and impaired memory.  Slowed breathing.  Seizures.  Loss of consciousness. DIAGNOSIS  Your health care provider will take a medical history and perform a physical exam. You will be asked about the amount and type of alcohol you have consumed. Blood tests will be done to measure the concentration of alcohol in your blood. In many places, your blood alcohol level must be lower than 80 mg/dL (0.03%) to legally drive. However, many dangerous effects of alcohol can occur at much lower levels.  TREATMENT  People with alcohol intoxication often do not require treatment. Most of the effects of alcohol  intoxication are temporary, and they go away as the alcohol naturally leaves the body. Your health care provider will monitor your condition until you are stable enough to go home. Fluids are sometimes given through an IV access tube to help prevent dehydration.  HOME CARE INSTRUCTIONS  Do not drive after drinking alcohol.  Stay hydrated. Drink enough water and fluids to keep your urine clear or pale yellow. Avoid caffeine.   Only take over-the-counter or prescription medicines as directed by your health care provider.  SEEK MEDICAL CARE IF:   You have persistent vomiting.   You do not feel better after a few days.  You have frequent alcohol intoxication. Your health care provider can help determine if you should see a substance use treatment counselor. SEEK IMMEDIATE MEDICAL CARE IF:   You become shaky or tremble when you try to stop drinking.   You shake uncontrollably (seizure).   You throw up (vomit) blood. This may be bright red or may look like black coffee grounds.   You have blood in your stool. This may be bright red or may appear as a black, tarry, bad smelling stool.   You become lightheaded or faint.  MAKE SURE YOU:   Understand these instructions.  Will watch your condition.  Will get help right away if you are not doing well or get worse.   This information is not intended to replace advice given to you by your health care provider. Make sure you discuss any questions you have with your health care provider.   Document Released: 10/25/2004 Document Revised: 09/17/2012 Document Reviewed: 06/20/2012 Elsevier Interactive Patient Education [employer]

## 2015-08-20 NOTE — ED Notes (Signed)
Patient denies pain and is resting comfortably.  

## 2015-08-20 NOTE — ED Notes (Signed)
Patient transported to CT 

## 2015-08-20 NOTE — ED Notes (Addendum)
Pt.'s wife  Katrina Hollander on phone and updated on pt.'s latest condition. Cell no. [phone].

## 2015-08-20 NOTE — ED Notes (Addendum)
Pt. Walked out of his room and went to room 20  Pooped and peed on the floor, naked walking in hallway, oriented to self but uncooperative, security called to try to escort pt. Back to his room, pt. Refused and insisted to be discharged. Pt."s friend Tresa Endo notified and coming to pick up pt. Md notified. Pt. For discharged. Pt. Resisted to be cleaned up, uncooperative, agitated.Pt. In paper scrubs in  lobby waiting for his friend Tresa Endo. Alert and oriented x3. MD notified.

## 2015-08-20 NOTE — ED Notes (Signed)
Pt.'s friend Malik Edwards , [phone] to be called anytime for update .

## 2015-08-20 NOTE — ED Notes (Signed)
Pt escorted off property by [employer] due to yelling, slamming phone in lobby and telling CN to [facility] Off when writer was trying to update pt regarding phone call from wife. Yelling at staff and police.

## 2015-08-20 NOTE — ED Notes (Signed)
Pt.'s  Wallet ,cellphone and car key are kept by pt.'s friend Tresa Endo, verified with Ms. Debbie.
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
## 2015-08-20 NOTE — ED Notes (Signed)
Refused for V/S taking, denied pain

## 2015-08-20 NOTE — ED Notes (Signed)
Trying to make note for 08/20/15 will not allow date to be changed. Randye Lobo RN

## 2015-08-20 NOTE — ED Notes (Signed)
pts wife called at 0815 requesting to speak to AD or someone "Higher" regarding her husband. At this time write wrote name and number down, notified Misha, RN [facility] to please call Benuel Veloz [phone]. Writer also explained to Katrina to call CN back if she has not received a call in a couple of hours due to [facility] making rounds in hospital.
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
## 2015-08-20 NOTE — ED Notes (Signed)
Opened chart to provide needed information to [employer] and Security.

## 2015-08-20 NOTE — ED Notes (Signed)
Pt. Still asleep,responsed to pain . pulled out PIV.

## 2015-08-22 ENCOUNTER — Encounter (HOSPITAL_COMMUNITY): Payer: Self-pay | Admitting: Emergency Medicine

## 2015-08-22 DIAGNOSIS — F329 Major depressive disorder, single episode, unspecified: Secondary | ICD-10-CM | POA: Diagnosis not present

## 2015-08-22 DIAGNOSIS — F17210 Nicotine dependence, cigarettes, uncomplicated: Secondary | ICD-10-CM | POA: Insufficient documentation

## 2015-08-22 DIAGNOSIS — Z7982 Long term (current) use of aspirin: Secondary | ICD-10-CM | POA: Insufficient documentation

## 2015-08-22 DIAGNOSIS — I10 Essential (primary) hypertension: Secondary | ICD-10-CM | POA: Diagnosis not present

## 2015-08-22 DIAGNOSIS — R45851 Suicidal ideations: Secondary | ICD-10-CM | POA: Diagnosis not present

## 2015-08-22 DIAGNOSIS — Z79899 Other long term (current) drug therapy: Secondary | ICD-10-CM | POA: Insufficient documentation

## 2015-08-22 LAB — COMPREHENSIVE METABOLIC PANEL
ALBUMIN: 4.2 g/dL (ref 3.5–5.0)
ALT: 32 U/L (ref 17–63)
AST: 33 U/L (ref 15–41)
Alkaline Phosphatase: 61 U/L (ref 38–126)
Anion gap: 12 (ref 5–15)
BUN: 11 mg/dL (ref 6–20)
CHLORIDE: 107 mmol/L (ref 101–111)
CO2: 22 mmol/L (ref 22–32)
CREATININE: 0.82 mg/dL (ref 0.61–1.24)
Calcium: 8.8 mg/dL — ABNORMAL LOW (ref 8.9–10.3)
GFR calc Af Amer: >60 mL/min (ref >60)
GLUCOSE: 89 mg/dL (ref 65–99)
POTASSIUM: 3.6 mmol/L (ref 3.5–5.1)
SODIUM: 141 mmol/L (ref 135–145)
Total Bilirubin: 0.6 mg/dL (ref 0.3–1.2)
Total Protein: 7.1 g/dL (ref 6.5–8.1)

## 2015-08-22 LAB — CBC
HEMATOCRIT: 45.3 % (ref 39.0–52.0)
HEMOGLOBIN: 15.7 g/dL (ref 13.0–17.0)
MCH: 30.8 pg (ref 26.0–34.0)
MCHC: 34.7 g/dL (ref 30.0–36.0)
MCV: 89 fL (ref 78.0–100.0)
Platelets: 263 10*3/uL (ref 150–400)
RBC: 5.09 MIL/uL (ref 4.22–5.81)
RDW: 13.1 % (ref 11.5–15.5)
WBC: 10.6 10*3/uL — ABNORMAL HIGH (ref 4.0–10.5)

## 2015-08-22 LAB — RAPID URINE DRUG SCREEN, HOSP PERFORMED
Amphetamines: NOT DETECTED
BARBITURATES: NOT DETECTED
Benzodiazepines: NOT DETECTED
COCAINE: NOT DETECTED
Opiates: NOT DETECTED
TETRAHYDROCANNABINOL: NOT DETECTED

## 2015-08-22 LAB — ETHANOL: ALCOHOL ETHYL (B): 184 mg/dL — AB (ref <5)

## 2015-08-22 LAB — CBG MONITORING, ED: GLUCOSE-CAPILLARY: 115 mg/dL — AB (ref 65–99)

## 2015-08-22 NOTE — ED Triage Notes (Signed)
Was seen 2 days ago for same  Med clearance, now acting unusual, his ETOH was high .  Then was seen at [facility] reg . Just now coming from counselor and he  States he does not fear death states does not want to hurt himself , but wife feels different, he does not rememer drinking last week NO HI  Denies SI but off his psch med

## 2015-08-23 ENCOUNTER — Emergency Department (HOSPITAL_COMMUNITY)
Admission: EM | Admit: 2015-08-23 | Discharge: 2015-08-23 | Disposition: A | Discharge status: Home / Self Care | Payer: 59 | Attending: Emergency Medicine | Admitting: Emergency Medicine

## 2015-08-23 DIAGNOSIS — F329 Major depressive disorder, single episode, unspecified: Secondary | ICD-10-CM

## 2015-08-23 DIAGNOSIS — R45851 Suicidal ideations: Secondary | ICD-10-CM

## 2015-08-23 DIAGNOSIS — F32A Depression, unspecified: Secondary | ICD-10-CM

## 2015-08-23 MED ORDER — AMLODIPINE BESYLATE 5 MG PO TABS
5.0000 mg | ORAL_TABLET | Freq: Every day | ORAL | Status: DC
  Administered 2015-08-23: 5 mg via ORAL
  Filled 2015-08-23: qty 1

## 2015-08-23 MED ORDER — NICOTINE 21 MG/24HR TD PT24
21.0000 mg | MEDICATED_PATCH | Freq: Once | TRANSDERMAL | Status: DC
  Administered 2015-08-23: 21 mg via TRANSDERMAL
  Filled 2015-08-23: qty 1

## 2015-08-23 MED ORDER — ASPIRIN EC 81 MG PO TBEC
162.0000 mg | DELAYED_RELEASE_TABLET | Freq: Every day | ORAL | Status: DC
  Administered 2015-08-23: 162 mg via ORAL
  Filled 2015-08-23: qty 2

## 2015-08-23 MED ORDER — LORAZEPAM 1 MG PO TABS: 0.0000 mg | ORAL_TABLET | Freq: Four times a day (QID) | ORAL | Status: DC

## 2015-08-23 MED ORDER — CARVEDILOL 12.5 MG PO TABS
25.0000 mg | ORAL_TABLET | Freq: Two times a day (BID) | ORAL | Status: DC
  Administered 2015-08-23 (×2): 25 mg via ORAL
  Filled 2015-08-23 (×2): qty 2

## 2015-08-23 MED ORDER — ADULT MULTIVITAMIN W/MINERALS CH
1.0000 | ORAL_TABLET | Freq: Every day | ORAL | Status: DC
Start: 2015-08-23 — End: 2015-08-24
  Administered 2015-08-23: 1 via ORAL
  Filled 2015-08-23: qty 1

## 2015-08-23 MED ORDER — VILAZODONE HCL 40 MG PO TABS
40.0000 mg | ORAL_TABLET | Freq: Every morning | ORAL | Status: DC
  Administered 2015-08-23: 40 mg via ORAL
  Filled 2015-08-23: qty 1

## 2015-08-23 MED ORDER — ONDANSETRON HCL 4 MG PO TABS: 4.0000 mg | ORAL_TABLET | Freq: Three times a day (TID) | ORAL | Status: DC | PRN

## 2015-08-23 MED ORDER — LORAZEPAM 1 MG PO TABS: 1.0000 mg | ORAL_TABLET | Freq: Three times a day (TID) | ORAL | Status: DC | PRN

## 2015-08-23 MED ORDER — LORAZEPAM 1 MG PO TABS: 0.0000 mg | ORAL_TABLET | Freq: Two times a day (BID) | ORAL | Status: DC

## 2015-08-23 MED ORDER — ACETAMINOPHEN 325 MG PO TABS: 650.0000 mg | ORAL_TABLET | ORAL | Status: DC | PRN

## 2015-08-23 MED ORDER — LISINOPRIL 10 MG PO TABS
10.0000 mg | ORAL_TABLET | Freq: Every day | ORAL | Status: DC
  Administered 2015-08-23: 10 mg via ORAL
  Filled 2015-08-23: qty 1

## 2015-08-23 MED ORDER — THIAMINE HCL 100 MG/ML IJ SOLN: 100.0000 mg | Freq: Every day | INTRAMUSCULAR | Status: DC

## 2015-08-23 MED ORDER — PRAVASTATIN SODIUM 40 MG PO TABS
40.0000 mg | ORAL_TABLET | Freq: Every evening | ORAL | Status: DC
  Administered 2015-08-23: 40 mg via ORAL
  Filled 2015-08-23: qty 1

## 2015-08-23 MED ORDER — VITAMIN B-1 100 MG PO TABS
100.0000 mg | ORAL_TABLET | Freq: Every day | ORAL | Status: DC
  Administered 2015-08-23: 100 mg via ORAL
  Filled 2015-08-23: qty 1

## 2015-08-23 NOTE — ED Notes (Signed)
Patient used phone for 2nd time.
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
## 2015-08-23 NOTE — ED Notes (Signed)
MD at bedside with patient

## 2015-08-23 NOTE — ED Notes (Signed)
Pt on phone. Informed of 2 call /day plolicy.

## 2015-08-23 NOTE — ED Notes (Signed)
Phone call at 1454 was actually 3rd call for the day.  Pt aware he can not make or take any additional calls until tomorrow.

## 2015-08-23 NOTE — ED Provider Notes (Signed)
Patient has been assessed by [facility] counselor and determined not to be suicidal and appropriate  for discharge. I have seen the patient and he is alert and appropriate. He does not show any signs of confusion or cognitive dysfunction. Patient denies suicidality. Patient is discharged per consultation with behavioral health.   Arby Barrette, MD 08/23/15 2134
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
## 2015-08-23 NOTE — ED Notes (Signed)
Wife and pt state they would like to leave tonight. Dr. Doristine Counter contacted and she states she will have conversation with pt and wife. Pt informed of same.

## 2015-08-29 ENCOUNTER — Encounter (HOSPITAL_COMMUNITY): Payer: Self-pay | Admitting: *Deleted

## 2015-08-29 ENCOUNTER — Inpatient Hospital Stay (HOSPITAL_COMMUNITY)
Admission: AD | Admit: 2015-08-29 | Discharge: 2015-09-01 | DRG: 897 | Disposition: A | Discharge status: Home / Self Care | Payer: 59 | Attending: Psychiatry | Admitting: Psychiatry

## 2015-08-29 DIAGNOSIS — Z6841 Body Mass Index (BMI) 40.0 and over, adult: Secondary | ICD-10-CM

## 2015-08-29 DIAGNOSIS — G4700 Insomnia, unspecified: Secondary | ICD-10-CM | POA: Diagnosis present

## 2015-08-29 DIAGNOSIS — I10 Essential (primary) hypertension: Secondary | ICD-10-CM | POA: Diagnosis present

## 2015-08-29 DIAGNOSIS — R45851 Suicidal ideations: Secondary | ICD-10-CM | POA: Diagnosis present

## 2015-08-29 DIAGNOSIS — F10229 Alcohol dependence with intoxication, unspecified: Principal | ICD-10-CM | POA: Diagnosis present

## 2015-08-29 DIAGNOSIS — F4310 Post-traumatic stress disorder, unspecified: Secondary | ICD-10-CM | POA: Diagnosis present

## 2015-08-29 DIAGNOSIS — F429 Obsessive-compulsive disorder, unspecified: Secondary | ICD-10-CM | POA: Diagnosis present

## 2015-08-29 DIAGNOSIS — E785 Hyperlipidemia, unspecified: Secondary | ICD-10-CM | POA: Diagnosis present

## 2015-08-29 DIAGNOSIS — I429 Cardiomyopathy, unspecified: Secondary | ICD-10-CM | POA: Diagnosis present

## 2015-08-29 DIAGNOSIS — G4733 Obstructive sleep apnea (adult) (pediatric): Secondary | ICD-10-CM | POA: Diagnosis present

## 2015-08-29 DIAGNOSIS — F1020 Alcohol dependence, uncomplicated: Secondary | ICD-10-CM | POA: Diagnosis present

## 2015-08-29 DIAGNOSIS — Z9114 Patient's other noncompliance with medication regimen: Secondary | ICD-10-CM

## 2015-08-29 DIAGNOSIS — Z79899 Other long term (current) drug therapy: Secondary | ICD-10-CM

## 2015-08-29 DIAGNOSIS — J45909 Unspecified asthma, uncomplicated: Secondary | ICD-10-CM | POA: Diagnosis present

## 2015-08-29 DIAGNOSIS — F17210 Nicotine dependence, cigarettes, uncomplicated: Secondary | ICD-10-CM | POA: Diagnosis present

## 2015-08-29 DIAGNOSIS — F329 Major depressive disorder, single episode, unspecified: Secondary | ICD-10-CM | POA: Diagnosis present

## 2015-08-29 DIAGNOSIS — Z7982 Long term (current) use of aspirin: Secondary | ICD-10-CM | POA: Diagnosis not present

## 2015-08-29 HISTORY — DX: Obsessive-compulsive disorder, unspecified: F42.9

## 2015-08-29 HISTORY — DX: Post-traumatic stress disorder, unspecified: F43.10

## 2015-08-29 MED ORDER — ACETAMINOPHEN 325 MG PO TABS: 650.0000 mg | ORAL_TABLET | Freq: Four times a day (QID) | ORAL | Status: DC | PRN

## 2015-08-29 MED ORDER — ADULT MULTIVITAMIN W/MINERALS CH
1.0000 | ORAL_TABLET | Freq: Every day | ORAL | Status: DC
  Administered 2015-08-30 – 2015-09-01 (×3): 1 via ORAL
  Filled 2015-08-29 (×5): qty 1

## 2015-08-29 MED ORDER — CARVEDILOL 25 MG PO TABS
25.0000 mg | ORAL_TABLET | Freq: Two times a day (BID) | ORAL | Status: DC
  Administered 2015-08-29 – 2015-09-01 (×6): 25 mg via ORAL
  Filled 2015-08-29 (×7): qty 1
  Filled 2015-08-29: qty 2
  Filled 2015-08-29 (×3): qty 1

## 2015-08-29 MED ORDER — ALUM & MAG HYDROXIDE-SIMETH 200-200-20 MG/5ML PO SUSP: 30.0000 mL | ORAL | Status: DC | PRN

## 2015-08-29 MED ORDER — AMLODIPINE BESYLATE 5 MG PO TABS
5.0000 mg | ORAL_TABLET | Freq: Every day | ORAL | Status: DC
  Administered 2015-08-30: 5 mg via ORAL
  Filled 2015-08-29 (×3): qty 1

## 2015-08-29 MED ORDER — PRAVASTATIN SODIUM 40 MG PO TABS
40.0000 mg | ORAL_TABLET | Freq: Every evening | ORAL | Status: DC
  Administered 2015-08-30 – 2015-08-31 (×2): 40 mg via ORAL
  Filled 2015-08-29 (×5): qty 1

## 2015-08-29 MED ORDER — MAGNESIUM HYDROXIDE 400 MG/5ML PO SUSP: 30.0000 mL | Freq: Every day | ORAL | Status: DC | PRN

## 2015-08-29 MED ORDER — LORAZEPAM 1 MG PO TABS: 1.0000 mg | ORAL_TABLET | Freq: Four times a day (QID) | ORAL | Status: DC | PRN

## 2015-08-29 MED ORDER — ASPIRIN EC 81 MG PO TBEC
162.0000 mg | DELAYED_RELEASE_TABLET | Freq: Every day | ORAL | Status: DC
Start: 2015-08-29 — End: 2015-09-01
  Administered 2015-08-30 – 2015-09-01 (×3): 162 mg via ORAL
  Filled 2015-08-29 (×5): qty 2

## 2015-08-29 MED ORDER — TRAZODONE HCL 100 MG PO TABS
100.0000 mg | ORAL_TABLET | Freq: Every evening | ORAL | Status: DC | PRN
  Administered 2015-08-31: 100 mg via ORAL
  Filled 2015-08-29 (×2): qty 1

## 2015-08-29 MED ORDER — ALBUTEROL SULFATE HFA 108 (90 BASE) MCG/ACT IN AERS: 2.0000 | INHALATION_SPRAY | RESPIRATORY_TRACT | Status: DC | PRN
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
## 2015-08-30 DIAGNOSIS — F1020 Alcohol dependence, uncomplicated: Secondary | ICD-10-CM

## 2015-08-30 MED ORDER — NICOTINE 21 MG/24HR TD PT24
21.0000 mg | MEDICATED_PATCH | Freq: Every day | TRANSDERMAL | Status: DC
  Administered 2015-08-30 – 2015-09-01 (×3): 21 mg via TRANSDERMAL
  Filled 2015-08-30 (×5): qty 1

## 2015-08-30 MED ORDER — VITAMIN D3 25 MCG (1000 UNIT) PO TABS
2000.0000 [IU] | ORAL_TABLET | Freq: Every day | ORAL | Status: DC
  Administered 2015-08-30 – 2015-09-01 (×3): 2000 [IU] via ORAL
  Filled 2015-08-30 (×6): qty 2

## 2015-08-30 MED ORDER — VILAZODONE HCL 40 MG PO TABS
40.0000 mg | ORAL_TABLET | Freq: Every day | ORAL | Status: DC
  Administered 2015-08-31 – 2015-09-01 (×2): 40 mg via ORAL
  Filled 2015-08-30 (×4): qty 1

## 2015-08-30 MED ORDER — LISINOPRIL 10 MG PO TABS
10.0000 mg | ORAL_TABLET | Freq: Every day | ORAL | Status: DC
  Administered 2015-08-30 – 2015-09-01 (×3): 10 mg via ORAL
  Filled 2015-08-30 (×4): qty 1
  Filled 2015-08-30: qty 2
  Filled 2015-08-30: qty 1

## 2015-08-30 MED ORDER — AMLODIPINE BESYLATE 2.5 MG PO TABS
2.5000 mg | ORAL_TABLET | Freq: Every day | ORAL | Status: DC
  Filled 2015-08-30: qty 1

## 2015-08-30 NOTE — Tx Team (Signed)
Initial Interdisciplinary Treatment Plan   PATIENT STRESSORS: Health problems Marital or family conflict Medication change or noncompliance Substance abuse   PATIENT STRENGTHS: Average or above average intelligence Capable of independent living General fund of knowledge Motivation for treatment/growth Supportive family/friends   PROBLEM LIST: Problem List/Patient Goals Date to be addressed Date deferred Reason deferred Estimated date of resolution  Alcohol abuse      Depression      Non compliant with medications            "I need to get back on my medications"      "I've got to get myself together and stop drinking"                         DISCHARGE CRITERIA:  Ability to meet basic life and health needs Improved stabilization in mood, thinking, and/or behavior Medical problems require only outpatient monitoring Motivation to continue treatment in a less acute level of care Need for constant or close observation no longer present Verbal commitment to aftercare and medication compliance Withdrawal symptoms are absent or subacute and managed without 24-hour nursing intervention  PRELIMINARY DISCHARGE PLAN: Attend aftercare/continuing care group Attend 12-step recovery group Outpatient therapy Return to previous living arrangement Return to previous work or school arrangements  PATIENT/FAMIILY INVOLVEMENT: This treatment plan has been presented to and reviewed with the patient, Malik Edwards, and/or family member.  The patient and family have been given the opportunity to ask questions and make suggestions.  Jesus Genera [facility] 08/30/2015, 1:11 AM

## 2015-08-30 NOTE — [facility] Group Notes (Signed)
[facility] LCSW Group Therapy  08/30/2015 2:31 PM  Type of Therapy:  Group Therapy  Participation Level:  Did Not Attend--pt invited. Did not attend.   Summary of Progress/Problems: [facility] Speaker came to talk about his personal journey with substance abuse and addiction.  Smart, Simrit Gohlke LCSW 08/30/2015, 2:31 PM

## 2015-08-30 NOTE — Progress Notes (Signed)
[facility] Group Notes:  (Nursing/MHT/Case Management/Adjunct)  Date:  08/30/2015  Time:  12:08 AM  Type of Therapy:  Psychoeducational Skills  Participation Level:  None  Participation Quality:  Attentive  Affect:  Depressed  Cognitive:  Lacking  Insight:  None  Engagement in Group:  None  Modes of Intervention:  Education  Summary of Progress/Problems: The had nothing to share in wrap up group since yesterday was his first day in the hospital.   Hazle Coca S 08/30/2015, 12:08 AM
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
## 2015-08-30 NOTE — [facility] Suicide Risk Assessment (Addendum)
Malik Edwards Admission Suicide Risk Assessment   Nursing information obtained from:   chart review, treatment team meeting Demographic factors:   caucasian, trauma history Current Mental Status:   depression Loss Factors:   separation from his wife Historical Factors:    history of suicide attempts Risk Reduction Factors:    future oriented plans  Total Time spent with patient: 20 minutes Principal Problem: Alcohol use disorder, severe, dependence (HCC) Diagnosis:   Patient Active Problem List   Diagnosis Date Noted  . Alcohol use disorder, severe, dependence (HCC) [F10.20] 08/29/2015  . Alcohol use disorder, moderate, dependence (HCC) [F10.20] 01/13/2015  . PTSD (post-traumatic stress disorder) [F43.10] 01/13/2015  . MDD (major depressive disorder), recurrent severe, without psychosis (HCC) [F33.2] 01/13/2015  . OSA (obstructive sleep apnea) [G47.33] 01/22/2013  . Tobacco use [Z72.0] 10/15/2012  . Atrial flutter with rapid ventricular response (HCC) [I48.92] 09/13/2012  . Cardiomyopathy- etiol uindetermined- EF reportedly 35-40% [I42.9] 09/13/2012  . HTN (hypertension) [I10] 09/13/2012  . Morbid obesity with BMI of 45.0-49.9, adult (HCC) [E66.01, Z68.42] 09/13/2012  . Transaminitis- ?fatty liver, worse when statin added [R74.0] 09/13/2012   Subjective Data:  40 year old male with history of alcohol use disorder in sustained remission, depression, PTSD, history of Atrial flutter, OSA who was brought by the police to the [facility] for unresponsiveness in the setting of alcohol intoxication after seven months of sobriety. On exam, he endorses depression in the setting of separation from his wife and off his medication of Vilazodone for about a month. He reports passive SI before coming to the unit but denies any since admission.     Continued Clinical Symptoms:  Alcohol Use Disorder Identification Test Final Score (AUDIT): 10 The "Alcohol Use Disorders Identification Test", Guidelines  for Use in Primary Care, Second Edition.  World Science writer [facility]). Score between 0-7:  no or low risk or alcohol related problems. Score between 8-15:  moderate risk of alcohol related problems. Score between 16-19:  high risk of alcohol related problems. Score 20 or above:  warrants further diagnostic evaluation for alcohol dependence and treatment.   CLINICAL FACTORS:   Depression:   Anhedonia Comorbid alcohol abuse/dependence Insomnia   Musculoskeletal: Strength & Muscle Tone: within normal limits Gait & Station: normal Patient leans: N/A  Psychiatric Specialty Exam: Physical Exam  Constitutional: He is oriented to person, place, and time. He appears well-developed and well-nourished.  Neurological: He is alert and oriented to person, place, and time.  No tremors    Review of Systems  All other systems reviewed and are negative.   Blood pressure 119/82, pulse 82, temperature 97.5 F (36.4 C), temperature source Oral, resp. rate 16, height  (1.753 m), weight 280 lb (127 kg).Body mass index is 41.35 kg/m.  General Appearance: Casual  Eye Contact:  Good  Speech:  Normal Rate  Volume:  Normal  Mood:  Depressed  Affect:  Tearful, dysphoric  Thought Process:  Coherent and Goal Directed  Orientation:  Full (Time, Place, and Person)  Thought Content:  Logical  Suicidal Thoughts:  No  Homicidal Thoughts:  No  Memory:  intact  Judgement:  Fair  Insight:  Good  Psychomotor Activity:  Normal  Concentration:  Concentration: Good and Attention Span: Good  Recall:  Good  Fund of Knowledge:  Good  Language:  Fair  Akathisia:  No  Handed:  Right  AIMS (if indicated):     Assets:  Communication Skills Desire for Improvement  ADL's:  Intact  Cognition:  WNL  Sleep:  Number of Hours: 6.5      COGNITIVE FEATURES THAT CONTRIBUTE TO RISK:  None    SUICIDE RISK:   Mild:  Suicidal ideation of limited frequency, intensity, duration, and specificity.  There are  no identifiable plans, no associated intent, mild dysphoria and related symptoms, good self-control (both objective and subjective assessment), few other risk factors, and identifiable protective factors, including available and accessible social support.   PLAN OF CARE: Psychiatry admission. Resume his home medication of Vilazodone. Encouraged attending groups and milieu.   I certify that inpatient services furnished can reasonably be expected to improve the patient's condition.  Neysa Hotter, MD 08/30/2015, 5:40 PM

## 2015-08-30 NOTE — Progress Notes (Signed)
Aniruddh denies Anxiety and Depression at this time. Rates both 0/10. Denies SI/HI/AVH at this time. Contracts for safety.  A: Encouragement and support given. Q15 minute room checks for patient safety. Medications administered as prescribed.  R: Continue to monitor for patient safety and medication effectiveness.

## 2015-08-30 NOTE — Progress Notes (Signed)
Patient ID: Malik Edwards, male   DOB: July 18, 1975, 40 y.o.   MRN: [id number]  DAR: Pt. Denies SI/HI and A/V Hallucinations. He reports sleep is good, appetite is good, energy level is normal, and concentration is good. He rates depression, anxiety, and hopelessness 0/10. Patient does not report any withdrawal symptoms during morning assessment and no outward signs of withdrawal observed by this Clinical research associate. Patient does not report any pain or discomfort at this time. Support and encouragement provided to the patient. Scheduled medications administered to patient per physician's orders. Patient states, "what about my Vybrid?" Writer encouraged patient to speak with provider about this. Patient reports that it was helping PTA. Patient is minimal but  Cooperative. He is seen interacting with peers but minimal and somewhat avoidant of staff. No behavioral incidents noted. Q15 minute checks are maintained for safety.

## 2015-08-30 NOTE — Plan of Care (Signed)
Problem: Medication: Goal: Compliance with prescribed medication regimen will improve Outcome: Progressing Compliance with medication at this time.

## 2015-08-30 NOTE — [facility] Group Notes (Signed)
Patient attend group. His day was a 8. He talked to the psychiatrist to day that was his goal.

## 2015-08-30 NOTE — Progress Notes (Signed)
Recreation Therapy Notes  Animal-Assisted Activity (AAA) Program Checklist/Progress Notes Patient Eligibility Criteria Checklist & Daily Group note for Rec TxIntervention  Date: 08.01.2017 Time: 2:45pm Location: [address]    AAA/T Program Assumption of Risk Form signed by Patient/ or Parent Legal Guardian Yes  Patient is free of allergies or sever asthma Yes  Patient reports no fear of animals Yes  Patient reports no history of cruelty to animals Yes  Patient understands his/her participation is voluntary Yes  Behavioral Response: Did not attend.   Marykay Lex Dwight Burdo, LRT/CTRS  Jearl Klinefelter 08/30/2015 3:54 PM
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
## 2015-08-30 NOTE — [facility] Suicide Risk Assessment (Signed)
[facility] INPATIENT:  Family/Significant Other Suicide Prevention Education  Suicide Prevention Education:  Patient Refusal for Family/Significant Other Suicide Prevention Education: The patient Malik Edwards has refused to provide written consent for family/significant other to be provided Family/Significant Other Suicide Prevention Education during admission and/or prior to discharge.  Physician notified.  SPE completed with pt, as pt refused to consent to family contact. SPI pamphlet provided to pt and pt was encouraged to share information with support network, ask questions, and talk about any concerns relating to SPE. Pt denies access to guns/firearms and verbalized understanding of information provided. Mobile Crisis information also provided to pt.   Smart, Regino Fournet LCSW 08/30/2015, 3:09 PM

## 2015-08-30 NOTE — Tx Team (Signed)
Interdisciplinary Treatment Plan Update (Adult)  Date:  08/30/2015  Time Reviewed:  12:49 PM   Progress in Treatment: Attending groups: No. New to unit. Continuing to assess.  Participating in groups:  No. Taking medication as prescribed:  Yes. Tolerating medication:  Yes. Family/Significant othe contact made:  SPE required for this pt.  Patient understands diagnosis:  Yes. and As evidenced by:  seeking treatment for SI with multiple plans, depression, alcohol abuse/recent relapse, and for medication stabilization. pt also reports AH.  Discussing patient identified problems/goals with staff:  Yes. Medical problems stabilized or resolved:  Yes. Denies suicidal/homicidal ideation: Yes. Issues/concerns per patient self-inventory:  Other:  Discharge Plan or Barriers: Pt reports that he currently sees a psychiatrist and counselor at [facility] in [place]. CSW assessing for additional referrals.   Reason for Continuation of Hospitalization: Depression Hallucinations Medication stabilization Withdrawal symptoms  Comments:  Pt reports SI with multiple plans. Pt states "I would do anything but use a gun." Pt denies HI. Pt reports auditory hallucinations. Pt states voices tell him to hurt himself. Pt sees Marlan Palau at the [facility] and a psychiatrist. Pt is being treated for PTSD. Pt has received treatment for SA as well. Pt reports alcohol use. Pt states he was sober and relapsed on Friday. Pt reports past inpatient treatment at [facility] and [facility]. Pt denies current stressors. Pt states he cannot seem to stop having SI.  Diagnosis: MDD, recurrent, severe, w/ psychotic features  Estimated length of stay:  3-5 days   New goal(s): to develop effective aftercare plan.   Additional Comments:  Patient and CSW reviewed pt's identified goals and treatment plan. Patient verbalized understanding and agreed to treatment plan. CSW reviewed [facility] "Discharge Process and Patient Involvement"  Form. Pt verbalized understanding of information provided and signed form.    Review of initial/current patient goals per problem list:  1. Goal(s): Patient will participate in aftercare plan  Met: Yes  Target date: at discharge  As evidenced by: Patient will participate within aftercare plan AEB aftercare provider and housing plan at discharge being identified.  8/1: Pt plans to return home; follow-up at [facility].   2. Goal (s): Patient will exhibit decreased depressive symptoms and suicidal ideations.  Met: No.    Target date: at discharge As evidenced by: Patient will utilize self rating of depression at 3 or below and demonstrate decreased signs of depression or be deemed stable for discharge by MD.     8/1: Pt rates depression as high. Denies SI/HI/AVH at this time.   4. Goal(s): Patient will demonstrate decreased signs of withdrawal due to substance abuse  Met:Yes  Target date:at discharge   As evidenced by: Patient will produce a CIWA/COWS score of 0, have stable vitals signs, and no symptoms of withdrawal.  8/1: Pt reports no signs of withdrawal with CIWA of 0 and stable vitals. He is not on detox protocol.   Attendees: Patient:   08/30/2015 12:49 PM   Family:   08/30/2015 12:49 PM   Physician:  Dr. Shea Evans MD 08/30/2015 12:49 PM   Nursing:   Marilynne Halsted RN; Franco Nones RN 08/30/2015 12:49 PM   Clinical Social Worker: Maxie Better, LCSW 08/30/2015 12:49 PM   Clinical Social Worker:  08/30/2015 12:49 PM   Other:  Gerline Legacy Nurse Case Manager 08/30/2015 12:49 PM   Other:  Agustina Caroli NP; Samuel Jester NP 08/30/2015 12:49 PM   Other:   08/30/2015 12:49 PM   Other:  08/30/2015  12:49 PM   Other:  08/30/2015 12:49 PM   Other:  08/30/2015 12:49 PM    08/30/2015 12:49 PM    08/30/2015 12:49 PM    08/30/2015 12:49 PM    08/30/2015 12:49 PM    Scribe for Treatment Team:   Maxie Better, LCSW 08/30/2015 12:49 PM

## 2015-08-30 NOTE — Progress Notes (Signed)
Adult Psychoeducational Group Note  Date:  08/30/2015 Time:  0845  Group Topic/Focus:  Orientation:   The focus of this group is to educate the patient on the purpose and policies of crisis stabilization and provide a format to answer questions about their admission.  The group details unit policies and expectations of patients while admitted.   Participation Level:  Did Not Attend  Participation Quality:    Affect:    Cognitive:    Insight:   Engagement in Group:    Modes of Intervention:    Additional Comments:   Yosmar Ryker L 08/30/2015, 9:30 AM

## 2015-08-31 NOTE — [facility] Group Notes (Signed)
[facility] LCSW Group Therapy  08/31/2015 3:27 PM  Type of Therapy:  Group Therapy  Participation Level:  Active  Participation Quality:  Attentive  Affect:  Appropriate  Cognitive:  Alert and Oriented  Insight:  Improving  Engagement in Therapy:  Engaged  Modes of Intervention:  Discussion, Education, Exploration, Problem-solving, Rapport Building, Socialization and Support  Summary of Progress/Problems: Today's Topic: Overcoming Obstacles. Patients identified one short term goal and potential obstacles in reaching this goal. Patients processed barriers involved in overcoming these obstacles. Patients identified steps necessary for overcoming these obstacles and explored motivation (internal and external) for facing these difficulties head on. Jaycion was attentive and engaged during today's processing group. He shared that he relapsed on alcohol on Friday after his wife told him she wanted a divorce. Patient reports that although he and his wife will be separating, he has accepted this and is feeling emotionally stable and ready to return "to life." "I"m ready to get back to work and need a letter please." Patient is calm and pleasant during group, often joking with CSW and others. Patient states that he will resume services with the [facility] at discharge. "They really helped me so far, especially with the PTSD treatment."   Smart, Nikeia Henkes LCSW 08/31/2015, 3:27 PM

## 2015-08-31 NOTE — Plan of Care (Signed)
Problem: Activity: Goal: Sleeping patterns will improve Outcome: Progressing Pt reported good sleep on his self inventory.   Problem: Education: Goal: Emotional status will improve Outcome: Progressing Pt rated his depression, anxiety, and feelings of hopelessness all zero out of 10 on his self inventory.   Problem: Activity: Goal: Interest or engagement in leisure activities will improve Outcome: Progressing Pt was attentive and engaged during rec therapy. Goal: Imbalance in normal sleep/wake cycle will improve Outcome: Progressing See above.  Problem: Education: Goal: Knowledge of the prescribed therapeutic regimen will improve Outcome: Progressing Pt exhibits knowledge of medications.  Problem: Self-Concept: Goal: Level of anxiety will decrease Outcome: Progressing See above.

## 2015-08-31 NOTE — Progress Notes (Signed)
D: Malik Edwards has been pleasant and cooperative in the milieu. He denied SI/HI/AVH/pain. He mentioned that he has an outpatient psychiatrist and therapist, and he hopes that they do not make many changes to his medications upon discharge. He hopes to discharge soon. He admitted some anxiety r/t to being in an unfamiliar place, and at times does appear dysphoric.   A: Meds given as ordered. Q15 safety checks maintained. Support/encouragement offered.  R: Pt remains free from harm and continues with treatment. Will continue to monitor for needs/safety.

## 2015-08-31 NOTE — Progress Notes (Signed)
[facility] MD Progress Note  08/31/2015 2:36 PM Malik Edwards  MRN:  [id number] Subjective:  Patient reports he is feeling better, and at this time denies medication side effects. He also denies symptoms of alcohol withdrawal. He tends to minimize events, symptoms that led to admission. States he relapsed recently, had a " bad day" . As  Noted , at this time reporting improvement , and states that him and his wife have had good conversations, and their relationship is better. States she is going to let him  return home after discharge . Objective : I have discussed case with treatment team and have met with patient . Patient is a 40 year old married male, history of depression, PTSD from childhood trauma, and ETOH dependence. States he recently relapsed on alcohol after several  months of sobriety. Presented with suicidal ideations. Marital discord a major stressor. At this time patient improved, presents with full range of affect,and denies feeling depressed at present. Denies any suicidal ideations , denies any symptoms of alcohol withdrawal and does not present in any acute distress or discomfort, no tremors, no diaphoresis, no restlessness . 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 No disruptive behaviors on unit . Going to groups  Principal Problem: Alcohol use disorder, severe, dependence ([place]) Diagnosis:   Patient Active Problem List   Diagnosis Date Noted  . Alcohol use disorder, severe, dependence ([place]) [F10.20] 08/29/2015  . Alcohol use disorder, moderate, dependence ([place]) [F10.20] 01/13/2015  . PTSD (post-traumatic stress disorder) [F43.10] 01/13/2015  . MDD (major depressive disorder), recurrent severe, without psychosis ([place]) [F33.2] 01/13/2015  . OSA (obstructive sleep apnea) [G47.33] 01/22/2013  . Tobacco use [Z72.0] 10/15/2012  . Atrial flutter with rapid ventricular response ([place]) [I48.92] 09/13/2012  .  Cardiomyopathy- etiol uindetermined- EF reportedly 35-40% [I42.9] 09/13/2012  . HTN (hypertension) [I10] 09/13/2012  . Morbid obesity with BMI of 45.0-49.9, adult ([place]) [E66.01, Z68.42] 09/13/2012  . Transaminitis- ?fatty liver, worse when statin added [R74.0] 09/13/2012   Total Time spent with patient: 25 minutes   Past Medical History:  Past Medical History:  Diagnosis Date  . Anxiety   . Cardiomyopathy ([place])   . Colitis   . Depression   . High cholesterol   . Hypertension   . OCD (obsessive compulsive disorder)   . OSA (obstructive sleep apnea) 01/22/2013  . PTSD (post-traumatic stress disorder)     Past Surgical History:  Procedure Laterality Date  . CARDIOVERSION N/A 09/15/2012   Procedure: CARDIOVERSION;  Surgeon: Pixie Casino, MD;  Location: [facility] OR;  Service: Cardiovascular;  Laterality: N/A;   Family History:  Family History  Problem Relation Age of Onset  . Healthy Mother   . Healthy Father     Social History:  History  Alcohol Use  . 6.0 oz/week  . 12 Standard drinks or equivalent per week    Comment: pt stated his dad made him drink when he was 33 yrs old, moonshine until he was drunk     History  Drug Use No    Comment: denied all drug use    Social History   Social History  . Marital status: Married    Spouse name: Kellis Mcadam  . Number of children: 0  . Years of education: Assoc. Deg   Occupational History  . wood worker Other    [employer]   Social History Main Topics  . Smoking status: Current Every Day  Smoker    Packs/day: 1.00    Years: 20.00    Types: Cigarettes  . Smokeless tobacco: Never Used  . Alcohol use 6.0 oz/week    12 Standard drinks or equivalent per week     Comment: pt stated his dad made him drink when he was 60 yrs old, moonshine until he was drunk  . Drug use: No     Comment: denied all drug use  . Sexual activity: Yes    Partners: Female    Birth control/ protection: None     Comment: wife   Other Topics  Concern  . None   Social History Narrative   Patient lives at home with spouse.   Caffeine Use: 1-2 cups daily   Additional Social History:    Pain Medications: No abuse reported Prescriptions: No abuse reported. Pt reports noncompliance with medications x 1 month Over the Counter: No abuse reported History of alcohol / drug use?: Yes Longest period of sobriety (when/how long): 7-8 mths, pt reports relapse on 2.21.17 Negative Consequences of Use: Personal relationships Withdrawal Symptoms:  (No withdrawals symptoms reported) Name of Substance 1: Alcohol 1 - Age of First Use: Not Reported 1 - Amount (size/oz): Not Reported 1 - Frequency: Pt reports relapse on 2.21.17 after 7-8 mths of sobriety 1 - Duration: Ongoing  Sleep: Good  Appetite:  Good  Current Medications: Current Facility-Administered Medications  Medication Dose Route Frequency Provider Last Rate Last Dose  . acetaminophen (TYLENOL) tablet 650 mg  650 mg Oral Q6H PRN Niel Hummer, NP      . albuterol (PROVENTIL HFA;VENTOLIN HFA) 108 (90 Base) MCG/ACT inhaler 2 puff  2 puff Inhalation Q4H PRN Niel Hummer, NP      . alum & mag hydroxide-simeth (MAALOX/MYLANTA) 200-200-20 MG/5ML suspension 30 mL  30 mL Oral Q4H PRN Niel Hummer, NP      . aspirin EC tablet 162 mg  162 mg Oral Daily Niel Hummer, NP   162 mg at 08/31/15 0841  . carvedilol (COREG) tablet 25 mg  25 mg Oral BID Niel Hummer, NP   25 mg at 08/31/15 0842  . cholecalciferol (VITAMIN D) tablet 2,000 Units  2,000 Units Oral Daily Encarnacion Slates, NP   2,000 Units at 08/31/15 0841  . lisinopril (PRINIVIL,ZESTRIL) tablet 10 mg  10 mg Oral Daily Encarnacion Slates, NP   10 mg at 08/31/15 0842  . LORazepam (ATIVAN) tablet 1 mg  1 mg Oral Q6H PRN Niel Hummer, NP      . magnesium hydroxide (MILK OF MAGNESIA) suspension 30 mL  30 mL Oral Daily PRN Niel Hummer, NP      . multivitamin with minerals tablet 1 tablet  1 tablet Oral Daily Niel Hummer, NP   1 tablet at  08/31/15 [phone]  . nicotine (NICODERM CQ - dosed in mg/24 hours) patch 21 mg  21 mg Transdermal Daily Laverle Hobby, PA-C   21 mg at 08/31/15 0842  . pravastatin (PRAVACHOL) tablet 40 mg  40 mg Oral QPM Niel Hummer, NP   40 mg at 08/30/15 1647  . traZODone (DESYREL) tablet 100 mg  100 mg Oral QHS PRN Niel Hummer, NP      . Vilazodone HCl (VIIBRYD) TABS 40 mg  40 mg Oral Q breakfast Encarnacion Slates, NP   40 mg at 08/31/15 2263    Lab Results: No results found for this or any previous visit (from  the past 48 hour(s)).  Blood Alcohol level:  Lab Results  Component Value Date   ETH 184 (H) 08/22/2015   ETH 366 (HH) 76/73/4193    Metabolic Disorder Labs: Lab Results  Component Value Date   HGBA1C 5.5 01/14/2015   MPG 111 01/14/2015   Lab Results  Component Value Date   PROLACTIN 14.8 01/14/2015   Lab Results  Component Value Date   CHOL 218 (H) 01/14/2015   TRIG 221 (H) 01/14/2015   HDL 44 01/14/2015   CHOLHDL 5.0 01/14/2015   VLDL 44 (H) 01/14/2015   LDLCALC 130 (H) 01/14/2015    Physical Findings: AIMS:  , ,  ,  ,    CIWA:    COWS:     Musculoskeletal: Strength & Muscle Tone: within normal limits- no tremors, no diaphoresis, no acute distress, discomfort  Gait & Station: normal Patient leans: N/A  Psychiatric Specialty Exam: Physical Exam  ROS no headache, no chest pain, no shortness of breath at room air   Blood pressure (!) 144/86, pulse 75, temperature 98.2 F (36.8 C), temperature source Oral, resp. rate 18, height '5\' 9"'  (1.753 m), weight 280 lb (127 kg).Body mass index is 41.35 kg/m.  General Appearance: Well Groomed  Eye Contact:  Good  Speech:  Normal Rate  Volume:  Normal  Mood:  minimizes depression at this time, states he is feeling "OK"   Affect:  Appropriate and reactive   Thought Process:  Linear  Orientation:  Full (Time, Place, and Person)  Thought Content:  denies hallucinations, no delusions  Suicidal Thoughts:  No- denies suicidal or self  injurious ideations, denies any homicidal or violent ideations, specifically also denies any violent ideations towards wife   Homicidal Thoughts:  No  Memory:  recent and remote grossly intact   Judgement:  Other:  imoproving   Insight fair   Psychomotor Activity:  Normal- no current symptoms of ETOH withdrawal   Concentration:  Concentration: Good and Attention Span: Good  Recall:  Good  Fund of Knowledge:  Good  Language:  Good  Akathisia:  Negative  Handed:  Right  AIMS (if indicated):     Assets:  Desire for Improvement Resilience  ADL's:  Intact  Cognition:  WNL  Sleep:  Number of Hours: 6.75   Assessment - patient reports feeling better, and at this time minimizing symptoms of depression or any suicidal ideations. Recent positive conversations with wife have contributed to his feeling better. Denies medication side effects at this time and states Viibryd is effective and well tolerated . Denies alcohol WDL at this time and does not appear to be in any acute distress . Focused on being discharged soon  Treatment Plan Summary: Daily contact with patient to assess and evaluate symptoms and progress in treatment, Medication management, Plan inpatient treatment  and medications as below  Encourage ongoing group and milieu participation to work on coping skills and symptom reduction Encourage ongoing efforts to maintain sobriety, relapse prevention Continue Viibryd 40 mgrs QDAY for depression, anxiety Continue Trazodone 100 mgrs QHS PRN for insomnia as needed  Continue Ativan PRNs for possible alcohol WDL symptoms if needed  Treatment team working on disposition planning  Neita Garnet, MD 08/31/2015, 2:36 PM

## 2015-08-31 NOTE — Progress Notes (Signed)
Recreation Therapy Notes  Date: 08/31/15 Time: 0930 Location: [address] Hall Group Room  Group Topic: Stress Management  Goal Area(s) Addresses:  Patient will verbalize importance of using healthy stress management.  Patient will identify positive emotions associated with healthy stress management.   Behavioral Response: Engaged  Intervention: Stress Management   Activity :  Progressive Muscle Relaxation.  LRT introduced the technique of progressive muscle relaxation to patients.  Patients were asked to follow along with LRT as a script was read to guide patients through the activity.  Education:  Stress Management, Discharge Planning.   Education Outcome: Acknowledges edcuation/In group clarification offered/Needs additional education  Clinical Observations/Feedback:  Pt was attentive and engaged during group.   Caroll Rancher, LRT/CTRS

## 2015-08-31 NOTE — Progress Notes (Signed)
Pt attended NA group this evening.  

## 2015-08-31 NOTE — Progress Notes (Signed)
Adult Psychoeducational Group Note  Date:  08/31/2015 Time:  12:06 PM  Group Topic/Focus:  Goals Group:   The focus of this group is to help patients establish daily goals to achieve during treatment and discuss how the patient can incorporate goal setting into their daily lives to aide in recovery.   Participation Level:  Active  Participation Quality:  Appropriate  Affect:  Appropriate  Cognitive:  Appropriate  Insight: Appropriate  Engagement in Group:  Engaged  Modes of Intervention:  Discussion  Additional Comments:  Pt stated his goal for the day was to talk to a Therapist, sports, and to more friendly. Wynema Birch D 08/31/2015, 12:06 PM

## 2015-08-31 NOTE — Progress Notes (Signed)
Patient ID: Malik Edwards, male   DOB: 15-Apr-1975, 40 y.o.   MRN: [id number]  Pt currently presents with a flat affect and depressed behavior. Pt reports to writer that their goal is to "feel better." Pt states "I am a little worried about where I am going when I go home, my sponsor wants me to go to sober living house but I think I'm going to stay in a sober apt with roomates." Pt reports good sleep with current medication regimen. Pt denies cravings.   Pt provided with medications per providers orders. Pt's labs and vitals were monitored throughout the night. Pt supported emotionally and encouraged to express concerns and questions. Pt educated on medications and assertiveness techniques.   Pt's safety ensured with 15 minute and environmental checks. Pt currently denies SI/HI and A/V hallucinations. Pt verbally agrees to seek staff if SI/HI or A/VH occurs and to consult with staff before acting on any harmful thoughts. Will continue POC.

## 2015-09-01 MED ORDER — ALBUTEROL SULFATE HFA 108 (90 BASE) MCG/ACT IN AERS: 2.0000 | INHALATION_SPRAY | RESPIRATORY_TRACT | 1 refills | Status: DC | PRN

## 2015-09-01 MED ORDER — VITAMIN D 1000 UNITS PO TABS: 2000.0000 [IU] | ORAL_TABLET | Freq: Every day | ORAL | Status: DC

## 2015-09-01 MED ORDER — LISINOPRIL 10 MG PO TABS: 10.0000 mg | ORAL_TABLET | Freq: Every day | ORAL | 3 refills | Status: DC

## 2015-09-01 MED ORDER — TRAZODONE HCL 100 MG PO TABS: 100.0000 mg | ORAL_TABLET | Freq: Every evening | ORAL | 0 refills | Status: DC | PRN

## 2015-09-01 MED ORDER — CARVEDILOL 25 MG PO TABS: 25.0000 mg | ORAL_TABLET | Freq: Two times a day (BID) | ORAL | 0 refills | Status: DC

## 2015-09-01 MED ORDER — ASPIRIN 81 MG PO TBEC: 162.0000 mg | DELAYED_RELEASE_TABLET | Freq: Every day | ORAL | Status: DC

## 2015-09-01 MED ORDER — PRAVASTATIN SODIUM 40 MG PO TABS: 40.0000 mg | ORAL_TABLET | Freq: Every evening | ORAL | 3 refills | Status: DC

## 2015-09-01 MED ORDER — ONE-DAILY MULTI VITAMINS PO TABS: 1.0000 | ORAL_TABLET | Freq: Every day | ORAL | Status: DC

## 2015-09-01 MED ORDER — VILAZODONE HCL 40 MG PO TABS: 40.0000 mg | ORAL_TABLET | Freq: Every day | ORAL | 0 refills | Status: DC

## 2015-09-01 MED ORDER — AMLODIPINE BESYLATE 5 MG PO TABS: 5.0000 mg | ORAL_TABLET | Freq: Every day | ORAL | 0 refills | Status: DC

## 2015-09-01 MED ORDER — NICOTINE 21 MG/24HR TD PT24: 21.0000 mg | MEDICATED_PATCH | Freq: Every day | TRANSDERMAL | 0 refills | Status: DC

## 2015-09-01 NOTE — Discharge Summary (Signed)
Physician Discharge Summary Note  Patient:  Malik Edwards is an 40 y.o., male MRN:  [id number] DOB:  [birth year] Patient phone:  [phone] (home)  Patient address:   [address]  [place] [address],  Total Time spent with patient: Greater than 30 minutes  Date of Admission:  08/29/2015  Date of Discharge: 09-01-15  Reason for Admission: Alcohol intoxication/suicidal threats.  Principal Problem: Alcohol use disorder, severe, dependence [facility])  Discharge Diagnoses: Patient Active Problem List   Diagnosis Date Noted  . Alcohol use disorder, severe, dependence ([facility]) [F10.20] 08/29/2015  . Alcohol use disorder, moderate, dependence ([facility]) [F10.20] 01/13/2015  . PTSD (post-traumatic stress disorder) [F43.10] 01/13/2015  . MDD (major depressive disorder), recurrent severe, without psychosis ([facility]) [F33.2] 01/13/2015  . OSA (obstructive sleep apnea) [G47.33] 01/22/2013  . Tobacco use [Z72.0] 10/15/2012  . Atrial flutter with rapid ventricular response ([facility]) [I48.92] 09/13/2012  . Cardiomyopathy- etiol uindetermined- EF reportedly 35-40% [I42.9] 09/13/2012  . HTN (hypertension) [I10] 09/13/2012  . Morbid obesity with BMI of 45.0-49.9, adult ([facility]) [E66.01, Z68.42] 09/13/2012  . Transaminitis- ?fatty liver, worse when statin added [R74.0] 09/13/2012   Past Psychiatric History: Hx PTSD, Alcoholism  Past Medical History:  Past Medical History:  Diagnosis Date  . Anxiety   . Cardiomyopathy ([facility])   . Colitis   . Depression   . High cholesterol   . Hypertension   . OCD (obsessive compulsive disorder)   . OSA (obstructive sleep apnea) 01/22/2013  . PTSD (post-traumatic stress disorder)     Past Surgical History:  Procedure Laterality Date  . CARDIOVERSION N/A 09/15/2012   Procedure: CARDIOVERSION;  Surgeon: Chrystie Nose, MD;  Location: [facility] OR;  Service: Cardiovascular;  Laterality: N/A;   Family History:  Family History  Problem Relation Age of Onset  .  Healthy Mother   . Healthy Father    Family Psychiatric  History: See H&P  Social History:  History  Alcohol Use  . 6.0 oz/week  . 12 Standard drinks or equivalent per week    Comment: pt stated his dad made him drink when he was 59 yrs old, moonshine until he was drunk     History  Drug Use No    Comment: denied all drug use    Social History   Social History  . Marital status: Married    Spouse name: Bracy Pepper  . Number of children: 0  . Years of education: Assoc. Deg   Occupational History  . wood worker Other    [employer]   Social History Main Topics  . Smoking status: Current Every Day Smoker    Packs/day: 1.00    Years: 20.00    Types: Cigarettes  . Smokeless tobacco: Never Used  . Alcohol use 6.0 oz/week    12 Standard drinks or equivalent per week     Comment: pt stated his dad made him drink when he was 1 yrs old, moonshine until he was drunk  . Drug use: No     Comment: denied all drug use  . Sexual activity: Yes    Partners: Female    Birth control/ protection: None     Comment: wife   Other Topics Concern  . None   Social History Narrative   Patient lives at home with spouse.   Caffeine Use: 1-2 cups daily   Hospital Course:  This is an admission assessment for this 40 year old Caucasian male. Admitted to [facility] from the [facility] with  complaints of unresponsiveness & increased alcohol consumption. He is being admitted to the [facility] adult unit for evaluation/treatment. During this assessment, Malik Edwards reports, "The police took me to the [facility] on Sunday. My wife called them. She told the cops that I was suicidal. I guess the day prior, she had told me that she wanted a divorce. I know we have had some problems since getting off of my medication a month ago. I was on medication for my OCD. It is called Vibbryd. I was told this medicine is for depression too. I have been on this medication for a while. I used to drink a lot too. Have  been sober x 7 months. I have this little voice in me that told me that I should stop my medicine. The voice reminded me how well I was doing that I should be okay without the medicine. I listened to the voice & stopped my medicine & the result was not good. After stopping the medicine, I did well for a while, then the ruminating thoughts returned. I did not say to my wife that I was gonna kill myself, rather, I said I guess you will be better off without me. Then, I went to the [facility] store, got me a fifth of Vodka & drank it all. I did not utilize my support system. I stopped going to the [employer] I stopped calling my sponsor.  Now that I'm here, I would like to get back on my medicine. Please, I need to be discharged to get back to work".   Malik Edwards was admitted to the hospital with a BAL of 184. He did attempt to drinking a fifth of Vodka after his wife told that they should get a divorce. He stated on admission that he had stopped taking his medication a month prior & his behavior became, rather erratic & ruminating. Although with a BAL of 184, Malik Edwards was not presenting with any substance withdrawal symptoms. He was not in need of or received alcohol detoxification. However, he was in need of mood stabilization treatments. He was however, resumed on his Viibryd 40 mg for OCD/depression & Trazodone 100 mg for insomnia. He was resumed on all his pertinent home medications for all those pre-existing medical issues that he presented. He tolerated his treatment regimen without any adverse effects or reactions reported. He was enrolled & participated in the group counseling sessions being & held on this unit. He learned coping skills.  Malik Edwards's mood is now stable. This is evidenced by his reports of improved mood, absence of suicidal ideations or substance withdrawal symptoms. He is currently being discharged to continue psychiatric care/medication management on an outpatient basis as noted below. He is provided  with all the necessary information needed to make this appointment without problems. U[pon discharge, Malik Edwards denies any SIHI, AVH, delusional thoughts or paranoia. He left [facility] in no apparent distress. Transportation per wife/sponsor.  Physical Findings:  AIMS:  , ,  ,  ,    CIWA:    COWS:     Musculoskeletal: Strength & Muscle Tone: within normal limits Gait & Station: normal Patient leans: N/A  Psychiatric Specialty Exam: Review of Systems  Constitutional: Negative.   HENT: Negative.   Eyes: Negative.   Respiratory: Negative.   Cardiovascular: Negative.   Gastrointestinal: Negative.   Genitourinary: Negative.   Musculoskeletal: Negative.   Skin: Negative.   Neurological: Negative.   Endo/Heme/Allergies: Negative.   Psychiatric/Behavioral: Positive for depression (Stable) and substance abuse (Alcoholism, chronic).  Negative for hallucinations, memory loss and suicidal ideas. The patient has insomnia (Stable). The patient is not nervous/anxious.     Blood pressure 127/78, pulse 64, temperature 97.3 F (36.3 C), temperature source Oral, resp. rate 18, height 5\' 9"  (1.753 m), weight 127 kg (280 lb).Body mass index is 41.35 kg/m.  See Md's SRA   Have you used any form of tobacco in the last 30 days? (Cigarettes, Smokeless Tobacco, Cigars, and/or Pipes): Yes  Has this patient used any form of tobacco in the last 30 days? (Cigarettes, Smokeless Tobacco, Cigars, and/or Pipes): Yes, provided with a Nicotine patch prescription.  Metabolic Disorder Labs:  Lab Results  Component Value Date   HGBA1C 5.5 01/14/2015   MPG 111 01/14/2015   Lab Results  Component Value Date   PROLACTIN 14.8 01/14/2015   Lab Results  Component Value Date   CHOL 218 (H) 01/14/2015   TRIG 221 (H) 01/14/2015   HDL 44 01/14/2015   CHOLHDL 5.0 01/14/2015   VLDL 44 (H) 01/14/2015   LDLCALC 130 (H) 01/14/2015   See Psychiatric Specialty Exam and Suicide Risk Assessment completed by Attending Physician  prior to discharge.  Discharge destination:  Home  Is patient on multiple antipsychotic therapies at discharge:  No   Has Patient had three or more failed trials of antipsychotic monotherapy by history:  No  Recommended Plan for Multiple Antipsychotic Therapies: NA    Medication List    STOP taking these medications   furosemide 40 MG tablet Commonly known as:  LASIX     TAKE these medications     Indication  albuterol 108 (90 Base) MCG/ACT inhaler Commonly known as:  PROVENTIL HFA;VENTOLIN HFA Inhale 2 puffs into the lungs every 4 (four) hours as needed for wheezing or shortness of breath (cough, shortness of breath or wheezing.).  Indication:  Asthma   amLODipine 5 MG tablet Commonly known as:  NORVASC Take 1 tablet (5 mg total) by mouth daily. For high blood pressure What changed:  See the new instructions.  Indication:  High Blood Pressure   aspirin 81 MG EC tablet Take 2 tablets (162 mg total) by mouth daily. For heart health What changed:  medication strength  Indication:  Heart health   carvedilol 25 MG tablet Commonly known as:  COREG Take 1 tablet (25 mg total) by mouth 2 (two) times daily. For high blood pressure What changed:  See the new instructions.  Indication:  High Blood Pressure of Unknown Cause   cholecalciferol 1000 units tablet Commonly known as:  VITAMIN D Take 2 tablets (2,000 Units total) by mouth daily. For bone health  Indication:  Bone health   lisinopril 10 MG tablet Commonly known as:  PRINIVIL,ZESTRIL Take 1 tablet (10 mg total) by mouth daily. For high blood pressure What changed:  See the new instructions.  Indication:  High Blood Pressure   multivitamin tablet Take 1 tablet by mouth daily. For low vitamin  Indication:  Vitamin supplement   nicotine 21 mg/24hr patch Commonly known as:  NICODERM CQ - dosed in mg/24 hours Place 1 patch (21 mg total) onto the skin daily. For smoking cessation  Indication:  Nicotine Addiction    pravastatin 40 MG tablet Commonly known as:  PRAVACHOL Take 1 tablet (40 mg total) by mouth every evening. For high cholesterol What changed:  See the new instructions.  Indication:  Inherited Heterozygous Hypercholesterolemia, Type II A Hyperlipidemia   traZODone 100 MG tablet Commonly known as:  DESYREL Take 1  tablet (100 mg total) by mouth at bedtime as needed for sleep.  Indication:  Trouble Sleeping   Vilazodone HCl 40 MG Tabs Commonly known as:  VIIBRYD Take 1 tablet (40 mg total) by mouth daily with breakfast. For OCD/depression What changed:  when to take this  additional instructions  Indication:  Major Depressive Disorder, OCD      Follow-up Information    [facility] Follow up on 09/06/2015.   Why:  Appt at 4PM for medication management with Carlena Sax.  Contact information: [address] [place], [place] [address] Phone: [phone] Fax: [phone]       [facility] Follow up on 09/06/2015.   Why:  Appt at 3:00PM on this date for counseling with [name]. Thank you.  Contact information: [address] [place], [place] [address] Phone: [phone] Fax: [phone]         Follow-up recommendations: Activity:  As tolerated Diet: As recommended by your primary care doctor. Keep all scheduled follow-up appointments as recommended.   Comments: Take all your medications as prescribed by your mental healthcare provider. Report any adverse effects and or reactions from your medicines to your outpatient provider promptly. Patient is instructed and cautioned to not engage in alcohol and or illegal drug use while on prescription medicines. In the event of worsening symptoms, patient is instructed to call the crisis hotline, 911 and or go to the nearest ED for appropriate evaluation and treatment of symptoms. Follow-up with your primary care provider for your other medical issues, concerns and or health care needs.    Signed: Sanjuana Kava, PMHNP, FNP-BC 09/01/2015, 9:53 AM

## 2015-09-01 NOTE — Progress Notes (Signed)
D: Patient up and visible in the milieu. Spoke with patient 1:1. Rates sleep as "fair", appetite as "good", energy as "normal" and concentration "good." Patient's affect is appropriate, mood pleasant. Rating his depression, hopelessness and anxiety all at a 0/10. States goal for today is to discharge. Denies pain, physical problems.   A: Medicated per orders, no prns needed. Emotional support offered and self inventory reviewed.   R: Patient verbalizes understanding. Patient denies SI/HI and remains safe on level III obs.

## 2015-09-01 NOTE — Progress Notes (Signed)
  [facility] Adult Case Management Discharge Plan :  Will you be returning to the same living situation after discharge:  No. Pt plans to stay with his sister at discharge. "me and my wife are separating."  At discharge, do you have transportation home?: Yes,  sponsor or sister coming after lunch today Do you have the ability to pay for your medications: Yes,  [employer]  Release of information consent forms completed and submitted to medical records by CSW.   Patient to Follow up at: Follow-up Information    [facility] Follow up on 09/06/2015.   Why:  Appt at 4PM for medication management with Carlena Sax.  Contact information: [address] [place], [place] [address] Phone: [phone] Fax: [phone]       [facility] Follow up on 09/06/2015.   Why:  Appt at 3:00PM on this date for counseling with [name]. Thank you.  Contact information: [address] [place], [place] [address] Phone: [phone] Fax: [phone]          Next level of care provider has access to [facility] Link:no  Safety Planning and Suicide Prevention discussed: Yes,  SPE completed with pt; pt declined to consent to family contact.   Have you used any form of tobacco in the last 30 days? (Cigarettes, Smokeless Tobacco, Cigars, and/or Pipes): Yes  Has patient been referred to the Quitline?: Patient refused referral  Patient has been referred for addiction treatment: Yes  Smart, Trinidad Ingle LCSW 09/01/2015, 9:32 AM

## 2015-09-01 NOTE — [facility] Group Notes (Signed)
[facility] Group Notes:  (Nursing/MHT/Case Management/Adjunct)  Date:  09/01/2015  Time:  0900  Type of Therapy:  Nurse Education - Lifestyle and Leisure  Participation Level:  Active  Participation Quality:  Appropriate  Affect:  Appropriate  Cognitive:  Appropriate  Insight:  Appropriate  Engagement in Group:  Engaged  Modes of Intervention:  Discussion, Education and Support  Summary of Progress/Problems: Patient discussed his discharge plan which is to go to his sister's until he can rent a room. States he is most likely facing a divorce. States he does have a sponsor in place.  Merian Capron [facility] 09/01/2015, 0930

## 2015-09-01 NOTE — [facility] Suicide Risk Assessment (Signed)
[facility] Discharge Suicide Risk Assessment   Principal Problem: Alcohol use disorder, severe, dependence (HCC) Discharge Diagnoses:  Patient Active Problem List   Diagnosis Date Noted  . Alcohol use disorder, severe, dependence (HCC) [F10.20] 08/29/2015  . Alcohol use disorder, moderate, dependence (HCC) [F10.20] 01/13/2015  . PTSD (post-traumatic stress disorder) [F43.10] 01/13/2015  . MDD (major depressive disorder), recurrent severe, without psychosis (HCC) [F33.2] 01/13/2015  . OSA (obstructive sleep apnea) [G47.33] 01/22/2013  . Tobacco use [Z72.0] 10/15/2012  . Atrial flutter with rapid ventricular response (HCC) [I48.92] 09/13/2012  . Cardiomyopathy- etiol uindetermined- EF reportedly 35-40% [I42.9] 09/13/2012  . HTN (hypertension) [I10] 09/13/2012  . Morbid obesity with BMI of 45.0-49.9, adult (HCC) [E66.01, Z68.42] 09/13/2012  . Transaminitis- ?fatty liver, worse when statin added [R74.0] 09/13/2012    Total Time spent with patient: 20 minutes  Musculoskeletal: Strength & Muscle Tone: within normal limits Gait & Station: normal Patient leans: N/A  Psychiatric Specialty Exam: ROS  Blood pressure 127/78, pulse 64, temperature 97.3 F (36.3 C), temperature source Oral, resp. rate 18, height  (1.753 m), weight 280 lb (127 kg).Body mass index is 41.35 kg/m.  General Appearance: Casual  Eye Contact::  Good  Speech:  Normal Rate409  Volume:  Normal  Mood:  good  Affect:  constricted but much improved  Thought Process:  slightly constricted but much improved  Orientation:  Full (Time, Place, and Person)  Thought Content:  Logical  Suicidal Thoughts:  No  Homicidal Thoughts:  No  Memory:  intact  Judgement:  Good  Insight:  Good  Psychomotor Activity:  Normal  Concentration:  Good  Recall:  Good  Fund of Knowledge:Good  Language: Good  Akathisia:  No  Handed:  Ambidextrous  AIMS (if indicated):     Assets:  Communication Skills Desire for Improvement  Sleep:   Number of Hours: 5.5  Cognition: WNL  ADL's:  Intact   Mental Status Per Nursing Assessment::   On Admission:     Demographic Factors:  Male and Caucasian Marital discordance  Loss Factors: Loss of significant relationship  Historical Factors: Prior suicide attempts Two previous suicide attempts, last in 2005  Risk Reduction Factors:   Positive social support, Positive therapeutic relationship and Positive coping skills or problem solving skills  Continued Clinical Symptoms:  Depression:   Insomnia Alcohol/Substance Abuse/Dependencies  Cognitive Features That Contribute To Risk:  None    Suicide Risk:  Mild:  Suicidal ideation of limited frequency, intensity, duration, and specificity.  There are no identifiable plans, no associated intent, mild dysphoria and related symptoms, good self-control (both objective and subjective assessment), few other risk factors, and identifiable protective factors, including available and accessible social support.  Follow-up Information    [facility] Follow up on 09/06/2015.   Why:  Appt at 4PM for medication management with Carlena Sax.  Contact information: [address] [place], [place] [address] Phone: [phone] Fax: [phone]       [facility] Follow up on 09/06/2015.   Why:  Appt at 3:00PM on this date for counseling with [name]. Thank you.  Contact information: [address] [place], [place] [address] Phone: [phone] Fax: [phone]         Mr. Holzheimer denies any withdrawal symptoms. Although he feels stressed about separation from his wife, he is future oriented, motivated for sobriety and has a plan to live with his sister.    Plan Of Care/Follow-up recommendations:  Activity:  full Diet:  regular Tests:  none  Other:  follow up with psychiatrist/therapist. Continue Vilazodone    Neysa Hotter, MD 09/01/2015, 10:40 AM

## 2015-09-01 NOTE — Tx Team (Signed)
Interdisciplinary Treatment Plan Update (Adult)  Date:  09/01/2015  Time Reviewed:  9:35 AM   Progress in Treatment: Attending groups: Yes Participating in groups:  Yes Taking medication as prescribed:  Yes. Tolerating medication:  Yes. Family/Significant othe contact made:  SPE completed with pt; pt declined to consent to family contact.  Patient understands diagnosis:  Yes. and As evidenced by:  seeking treatment for SI with multiple plans, depression, alcohol abuse/recent relapse, and for medication stabilization. pt also reports AH.  Discussing patient identified problems/goals with staff:  Yes. Medical problems stabilized or resolved:  Yes. Denies suicidal/homicidal ideation: Yes. Issues/concerns per patient self-inventory:  Other:  Discharge Plan or Barriers: Pt scheduled for follow-up with current psychiatrist and counselor at [facility] in [place]. He has also been provided with AA list for [facility] [place]     Reason for Continuation of Hospitalization: none  Comments:  Pt reports SI with multiple plans. Pt states "I would do anything but use a gun." Pt denies HI. Pt reports auditory hallucinations. Pt states voices tell him to hurt himself. Pt sees Marlan Palau at the [facility] and a psychiatrist. Pt is being treated for PTSD. Pt has received treatment for SA as well. Pt reports alcohol use. Pt states he was sober and relapsed on Friday. Pt reports past inpatient treatment at [facility]. Pt denies current stressors. Pt states he cannot seem to stop having SI.  Diagnosis: MDD, recurrent, severe, w/ psychotic features  Estimated length of stay:  D/c today   Additional Comments:  Patient and CSW reviewed pt's identified goals and treatment plan. Patient verbalized understanding and agreed to treatment plan. CSW reviewed [facility] "Discharge Process and Patient Involvement" Form. Pt verbalized understanding of information provided and signed  form.    Review of initial/current patient goals per problem list:  1. Goal(s): Patient will participate in aftercare plan  Met: Yes  Target date: at discharge  As evidenced by: Patient will participate within aftercare plan AEB aftercare provider and housing plan at discharge being identified.  8/1: Pt plans to return home; follow-up at [facility].   8/3: Pt plans to return home; follow-up appts in place with the [facility].   2. Goal (s): Patient will exhibit decreased depressive symptoms and suicidal ideations.  Met: Yes.    Target date: at discharge  As evidenced by: Patient will utilize self rating of depression at 3 or below and demonstrate decreased signs of depression or be deemed stable for discharge by MD.     8/1: Pt rates depression as high. Denies SI/HI/AVH at this time.   4. Goal(s): Patient will demonstrate decreased signs of withdrawal due to substance abuse  Met:Yes  Target date:at discharge   As evidenced by: Patient will produce a CIWA/COWS score of 0, have stable vitals signs, and no symptoms of withdrawal.  8/1: Pt reports no signs of withdrawal with CIWA of 0 and stable vitals. He is not on detox protocol.   Attendees: Patient:   09/01/2015 9:35 AM   Family:   09/01/2015 9:35 AM   Physician:  Dr. Shea Evans MD; Dr. Elie Goody RN 09/01/2015 9:35 AM   Nursing:   Bayard Hugger RN; Jan RN 09/01/2015 9:35 AM   Clinical Social Worker: Maxie Better, LCSW 09/01/2015 9:35 AM   Clinical Social Worker: Erasmo Downer Drinkard LCSW 09/01/2015 9:35 AM   Other:  Gerline Legacy Nurse Case Manager 09/01/2015 9:35 AM   Other:  Agustina Caroli NP 09/01/2015 9:35 AM  Other:   09/01/2015 9:35 AM   Other:  09/01/2015 9:35 AM   Other:  09/01/2015 9:35 AM   Other:  09/01/2015 9:35 AM    09/01/2015 9:35 AM    09/01/2015 9:35 AM    09/01/2015 9:35 AM    09/01/2015 9:35 AM    Scribe for Treatment Team:   Maxie Better, LCSW 09/01/2015 9:35 AM

## 2015-09-01 NOTE — Progress Notes (Signed)
Patient verbalizes readiness for discharge. Follow up plan explained, Rx's given. All belongings returned. Patient verbalizes understanding. Denies SI/HI and assures this Clinical research associate he will seek assistance should that change. Patient discharged ambulatory and in stable condition to his sponsor.

## 2015-09-03 ENCOUNTER — Other Ambulatory Visit: Payer: Self-pay | Admitting: Cardiovascular Disease

## 2015-09-13 ENCOUNTER — Other Ambulatory Visit: Payer: Self-pay | Admitting: Cardiovascular Disease

## 2015-10-25 ENCOUNTER — Ambulatory Visit (INDEPENDENT_AMBULATORY_CARE_PROVIDER_SITE_OTHER): Payer: 59 | Admitting: Family Medicine

## 2015-10-25 VITALS — BP 130/80 | HR 79 | Temp 97.8°F | Resp 17 | Ht 68.5 in | Wt 294.0 lb

## 2015-10-25 DIAGNOSIS — J209 Acute bronchitis, unspecified: Secondary | ICD-10-CM

## 2015-10-25 MED ORDER — IPRATROPIUM BROMIDE 0.02 % IN SOLN
0.5000 mg | Freq: Once | RESPIRATORY_TRACT | Status: AC
  Administered 2015-10-25: 0.5 mg via RESPIRATORY_TRACT

## 2015-10-25 MED ORDER — ALBUTEROL SULFATE (2.5 MG/3ML) 0.083% IN NEBU
2.5000 mg | INHALATION_SOLUTION | Freq: Once | RESPIRATORY_TRACT | Status: DC
Start: 2015-10-25 — End: 2015-10-25
  Administered 2015-10-25: 2.5 mg via RESPIRATORY_TRACT

## 2015-10-25 MED ORDER — AMOXICILLIN-POT CLAVULANATE 875-125 MG PO TABS: 1.0000 | ORAL_TABLET | Freq: Two times a day (BID) | ORAL | 0 refills | Status: DC

## 2015-10-25 MED ORDER — ALBUTEROL SULFATE HFA 108 (90 BASE) MCG/ACT IN AERS: 2.0000 | INHALATION_SPRAY | RESPIRATORY_TRACT | 1 refills | Status: DC | PRN

## 2015-10-25 MED ORDER — PREDNISONE 20 MG PO TABS: ORAL_TABLET | ORAL | 1 refills | Status: DC

## 2015-10-25 MED ORDER — BENZONATATE 200 MG PO CAPS: 200.0000 mg | ORAL_CAPSULE | Freq: Two times a day (BID) | ORAL | 0 refills | Status: DC | PRN

## 2015-10-25 NOTE — Progress Notes (Signed)
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  He presents today with a cough of 2 weeks' duration. The cough is come so bad that at times he has trouble catching his breath. He's had this cough in the past and done well with an albuterol inhaler.  He does smoke but has not had a cigarette in several days because every time he inhales, he develops the terrible cough. He does not have hemoptysis.  Patient does get mildly short of breath when walking in from the parking lot and is mildly quite a bit by wheezing right now.  He says he felt a little bit depressed lately but he confessed because he hasn't slept well and is been feeling so bad physically.  Objective:BP 130/80 (BP Location: Right Arm, Patient Position: Sitting, Cuff Size: Large)   Pulse 79   Temp 97.8 F (36.6 C) (Oral)   Resp 17   Ht 5' 8.5" (1.74 m)   Wt 294 lb (133.4 kg)   SpO2 96%   BMI 44.05 kg/m  No acute distress but patient is coughing violently at times during the interview. HEENT: Unremarkable Chest: Bilateral inspiratory Wheezes Heart: Regular no murmur Skin: Normal color, no rash  Patient given nebulizer treatment: Improved aeration afterwards with decreased wheezing  Assessment:  Acute bronchitis. No evidence of pneumonia. Patient has a severe case but it is not getting worse, rather it is very bothersome and staying the same     ICD-9-CM ICD-10-CM   1. Acute bronchitis, unspecified organism 466.0 J20.9 predniSONE (DELTASONE) 20 MG tablet     amoxicillin-clavulanate (AUGMENTIN) 875-125 MG tablet     albuterol (PROVENTIL HFA;VENTOLIN HFA) 108 (90 Base) MCG/ACT inhaler     benzonatate (TESSALON) 200 MG capsule     ipratropium (ATROVENT) nebulizer solution 0.5 mg     DISCONTINUED: albuterol (PROVENTIL) (2.5 MG/3ML) 0.083% nebulizer solution 2.5 mg     Signed, Elvina Sidle, MD

## 2015-10-25 NOTE — Patient Instructions (Addendum)
Please return if you're not feeling better next 24-48 hours.    IF you received an x-ray today, you will receive an invoice from [facility]. Please contact [facility] at [phone] with questions or concerns regarding your invoice.   IF you received labwork today, you will receive an invoice from [employer]. Please contact [facility] at [phone] with questions or concerns regarding your invoice.   Our billing staff will not be able to assist you with questions regarding bills from these companies.  You will be contacted with the lab results as soon as they are available. The fastest way to get your results is to activate your My Chart account. Instructions are located on the last page of this paperwork. If you have not heard from [place] regarding the results in 2 weeks, please contact this office.

## 2015-12-27 ENCOUNTER — Ambulatory Visit (INDEPENDENT_AMBULATORY_CARE_PROVIDER_SITE_OTHER): Payer: 59 | Admitting: Family Medicine

## 2015-12-27 ENCOUNTER — Ambulatory Visit (INDEPENDENT_AMBULATORY_CARE_PROVIDER_SITE_OTHER): Payer: 59

## 2015-12-27 VITALS — BP 134/82 | HR 84 | Temp 98.1°F | Resp 18 | Ht 69.0 in | Wt 297.0 lb

## 2015-12-27 DIAGNOSIS — M79671 Pain in right foot: Secondary | ICD-10-CM

## 2015-12-27 DIAGNOSIS — B351 Tinea unguium: Secondary | ICD-10-CM

## 2015-12-27 MED ORDER — MELOXICAM 15 MG PO TABS: 15.0000 mg | ORAL_TABLET | Freq: Every day | ORAL | 1 refills | Status: DC

## 2015-12-27 MED ORDER — FLUCONAZOLE 150 MG PO TABS: 150.0000 mg | ORAL_TABLET | Freq: Every day | ORAL | 1 refills | Status: AC

## 2015-12-27 NOTE — Patient Instructions (Addendum)
Meloxicam 15 mg once daily as needed for foot pain. Apply ice and or heat to relieve foot pain.  I have referred you to podiatry.   Diflucan 150 mg times 6 weeks nail fungus.  IF you received an x-ray today, you will receive an invoice from [facility]. Please contact [facility] at [phone] with questions or concerns regarding your invoice.   IF you received labwork today, you will receive an invoice from [employer]. Please contact [facility] at [phone] with questions or concerns regarding your invoice.   Our billing staff will not be able to assist you with questions regarding bills from these companies.  You will be contacted with the lab results as soon as they are available. The fastest way to get your results is to activate your My Chart account. Instructions are located on the last page of this paperwork. If you have not heard from [place] regarding the results in 2 weeks, please contact this office.    Foot Pain Introduction Many things can cause foot pain. Some common causes are:  An injury.  A sprain.  Arthritis.  Blisters.  Bunions. Follow these instructions at home: Pay attention to any changes in your symptoms. Take these actions to help with your discomfort:  If directed, put ice on the affected area:  Put ice in a plastic bag.  Place a towel between your skin and the bag.  Leave the ice on for 15-20 minutes, 3?4 times a day for 2 days.  Take over-the-counter and prescription medicines only as told by your health care provider.  Wear comfortable, supportive shoes that fit you well. Do not wear high heels.  Do not stand or walk for long periods of time.  Do not lift a lot of weight. This can put added pressure on your feet.  Do stretches to relieve foot pain and stiffness as told by your health care provider.  Rub your foot gently.  Keep your feet clean and dry. Contact a health care provider if:  Your  pain does not get better after a few days of self-care.  Your pain gets worse.  You cannot stand on your foot. Get help right away if:  Your foot is numb or tingling.  Your foot or toes are swollen.  Your foot or toes turn white or blue.  You have warmth and redness along your foot. This information is not intended to replace advice given to you by your health care provider. Make sure you discuss any questions you have with your health care provider. Document Released: 02/11/2015 Document Revised: 06/23/2015 Document Reviewed: 02/10/2014  2017 Elsevier

## 2015-12-27 NOTE — Progress Notes (Signed)
Patient ID: Mathews Mow, male    DOB: 28-Apr-1975, 40 y.o.   MRN: [id number]  PCP: No primary care provider on file.  Chief Complaint  Patient presents with  . Foot Injury    pain, x 1 month, right  . Nail Problem    right hand, middle finger, possible infection    Subjective:   HPI 40 year old male presents for evaluation of right foot pain and nail fungus of 3rd right finger. Patient is familiar to [facility].  Right Foot Pain 2-3 years ago boots rubbed against right foot and caused increased tenderness of the lateral 5th tow. Reports that pain resolve on its one until recently. He reports sudden onset of recent 5th toe lateral foot pain. He is aware that he has a large calus below his fifth toe and has been wearing an OTC cushion treatment to try and soften the overgrown skin. Denies change in footwear. Reports that he typically wears out shoes easily as he tends to favor the lateral side of feet which ruins shoes.  Right middle finger  White and crusty under the nail and is very brittle Nailed was ripped off about 1.5 years ago, nail grew back and never truly attaches to the nail plate which allowed increase moisture to seep under the nail.   Review of Systems See HPI  Patient Active Problem List   Diagnosis Date Noted  . Alcohol use disorder, severe, dependence (HCC) 08/29/2015  . Alcohol use disorder, moderate, dependence (HCC) 01/13/2015  . PTSD (post-traumatic stress disorder) 01/13/2015  . 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 OSA (obstructive sleep apnea) 01/22/2013  . Tobacco use 10/15/2012  . Atrial flutter with rapid ventricular response (HCC) 09/13/2012  . Cardiomyopathy- etiol uindetermined- EF reportedly 35-40% 09/13/2012  . HTN (hypertension) 09/13/2012  . Morbid obesity with BMI of 45.0-49.9, adult (HCC) 09/13/2012  . Transaminitis- ?fatty liver, worse when statin added 09/13/2012     Prior to  Admission medications   Medication Sig Start Date End Date Taking? Authorizing Provider  albuterol (PROVENTIL HFA;VENTOLIN HFA) 108 (90 Base) MCG/ACT inhaler Inhale 2 puffs into the lungs every 4 (four) hours as needed for wheezing or shortness of breath (cough, shortness of breath or wheezing.). 10/25/15  Yes Elvina Sidle, MD  amLODipine (NORVASC) 5 MG tablet Take 1 tablet (5 mg total) by mouth daily. For high blood pressure 09/01/15  Yes Sanjuana Kava, NP  amLODipine (NORVASC) 5 MG tablet TAKE 1 TABLET EVERY DAY 09/13/15  Yes Mihai Croitoru, MD  aspirin EC 81 MG EC tablet Take 2 tablets (162 mg total) by mouth daily. For heart health 09/01/15  Yes Sanjuana Kava, NP  carvedilol (COREG) 25 MG tablet Take 1 tablet (25 mg total) by mouth 2 (two) times daily. For high blood pressure 09/01/15  Yes Sanjuana Kava, NP  cholecalciferol (VITAMIN D) 1000 units tablet Take 2 tablets (2,000 Units total) by mouth daily. For bone health 09/01/15  Yes Sanjuana Kava, NP  lisinopril (PRINIVIL,ZESTRIL) 10 MG tablet Take 1 tablet (10 mg total) by mouth daily. For high blood pressure 09/01/15  Yes Sanjuana Kava, NP  Multiple Vitamin (MULTIVITAMIN) tablet Take 1 tablet by mouth daily. For low vitamin 09/01/15  Yes Sanjuana Kava, NP  pravastatin (PRAVACHOL) 40 MG tablet Take 1 tablet (40 mg total) by mouth every evening. For high cholesterol 09/01/15  Yes Sanjuana Kava, NP  traZODone (DESYREL) 100 MG tablet Take  1 tablet (100 mg total) by mouth at bedtime as needed for sleep. 09/01/15  Yes Sanjuana KavaAgnes I Nwoko, NP  Vilazodone HCl (VIIBRYD) 40 MG TABS Take 1 tablet (40 mg total) by mouth daily with breakfast. For OCD/depression 09/01/15  Yes Sanjuana KavaAgnes I Nwoko, NP  nicotine (NICODERM CQ - DOSED IN MG/24 HOURS) 21 mg/24hr patch Place 1 patch (21 mg total) onto the skin daily. For smoking cessation Patient not taking: Reported on 12/27/2015 09/01/15   Sanjuana KavaAgnes I Nwoko, NP     Allergies  Allergen Reactions  . Statins     Elevated LFTs  (Lipitor  specifically)       Objective:  Physical Exam  Constitutional: He is oriented to person, place, and time. He appears well-developed and well-nourished.  HENT:  Head: Normocephalic and atraumatic.  Mouth/Throat: Oropharynx is clear and moist.  Eyes: Pupils are equal, round, and reactive to light.  Neck: Normal range of motion.  Cardiovascular: Normal rate, regular rhythm, normal heart sounds and intact distal pulses.   Pulmonary/Chest: Effort normal and breath sounds normal.  Musculoskeletal:  Tenderness on the posterior and lateral surface of right foot at 5th metatarsal region of right foot.  Neurological: He is alert and oriented to person, place, and time.  Skin: Skin is warm and dry.  Psychiatric: He has a normal mood and affect. His behavior is normal. Judgment normal.     Vitals:   12/27/15 1713  BP: 134/82  Pulse: 84  Resp: 18  Temp: 98.1 F (36.7 C)  Dg Foot 2 Views Right  Result Date: 12/27/2015 CLINICAL DATA:  Pain for 1 month EXAM: RIGHT FOOT - 2 VIEW COMPARISON:  None. FINDINGS: No acute fracture. No dislocation. Unremarkable soft tissues. There is a small bony density inferior to the calcaneus likely related to calcific tendinosis. Mild degenerative change at the first metatarsophalangeal joint. Chronic tiny bony density is dorsal to the distal talus. IMPRESSION: No acute bony pathology.  Chronic changes. Electronically Signed   By: Jolaine ClickArthur  Hoss M.D.   On: 12/27/2015 18:33       Assessment & Plan:  1. Right foot pain - DG Foot 2 Views Right   Plan: Meloxicam 15 mg once daily as needed for foot pain. Apply ice and or heat to relieve foot pain. - Ambulatory referral to Podiatry  2. Nail Fungus  Plan: -Diflucan 150 mg times 6 weeks nail fungus.  Follow-up as needed.  Godfrey PickKimberly S. Tiburcio PeaHarris, MSN, FNP-C [facility]

## 2016-05-26 ENCOUNTER — Other Ambulatory Visit: Payer: Self-pay | Admitting: Cardiovascular Disease

## 2016-05-28 NOTE — Telephone Encounter (Signed)
REFILL 

## 2016-06-17 ENCOUNTER — Other Ambulatory Visit: Payer: Self-pay | Admitting: Cardiovascular Disease

## 2016-06-18 NOTE — Telephone Encounter (Signed)
Rx request sent to pharmacy.  

## 2016-06-29 ENCOUNTER — Telehealth: Payer: Self-pay | Admitting: Cardiovascular Disease

## 2016-06-29 MED ORDER — PRAVASTATIN SODIUM 40 MG PO TABS: 40.0000 mg | ORAL_TABLET | Freq: Every evening | ORAL | 2 refills | Status: DC

## 2016-06-29 NOTE — Telephone Encounter (Signed)
New message      *STAT* If patient is at the pharmacy, call can be transferred to refill team.   1. Which medications need to be refilled? (please list name of each medication and dose if known)  pravastatin (PRAVACHOL) 40 MG tablet Take 1 tablet (40 mg total) by mouth every evening. For high cholesterol     2. Which pharmacy/location (including street and city if local pharmacy) is medication to be sent to? [facility]  3. Do they need a 30 day or 90 day supply?  90

## 2016-08-31 ENCOUNTER — Ambulatory Visit (INDEPENDENT_AMBULATORY_CARE_PROVIDER_SITE_OTHER): Payer: Self-pay | Admitting: Cardiovascular Disease

## 2016-08-31 VITALS — BP 158/96 | HR 71 | Ht 69.0 in | Wt 263.2 lb

## 2016-08-31 DIAGNOSIS — I4892 Unspecified atrial flutter: Secondary | ICD-10-CM

## 2016-08-31 DIAGNOSIS — F1021 Alcohol dependence, in remission: Secondary | ICD-10-CM

## 2016-08-31 DIAGNOSIS — Z6835 Body mass index (BMI) 35.0-35.9, adult: Secondary | ICD-10-CM

## 2016-08-31 DIAGNOSIS — Z720 Tobacco use: Secondary | ICD-10-CM

## 2016-08-31 DIAGNOSIS — I10 Essential (primary) hypertension: Secondary | ICD-10-CM

## 2016-08-31 MED ORDER — AMLODIPINE BESYLATE 10 MG PO TABS: 10.0000 mg | ORAL_TABLET | Freq: Every day | ORAL | 3 refills | Status: DC

## 2016-08-31 NOTE — Patient Instructions (Signed)
Medication Instructions:  INCREASE- Amlodipine 10 mg daily  If you need a refill on your cardiac medications before your next appointment, please call your pharmacy.  Labwork: None Ordered  Testing/Procedures: None Ordered  Follow-Up: Your physician wants you to follow-up in: 3 Months with Dr Royann Shivers   Special Instructions: Keep reading of blood pressure and upload through your MyChart   Thank you for choosing [facility] at [facility]!!

## 2016-08-31 NOTE — Progress Notes (Signed)
Patient ID: Malik Edwards, male   DOB: [birth year], 41 y.o.   MRN: [id number]     Cardiology Office Note    Date:  09/01/2016   ID:  Malik Edwards, DOB April 25, 1975, MRN [id number]  PCP:  Patient, No Pcp Per  Cardiologist:   Thurmon Fair, MD   Chief Complaint  Patient presents with  . Follow-up    pt denied chest pain and  SOB    History of Present Illness:  Malik Edwards is a 41 y.o. male with a history of atrial flutter and tachycardia related cardiomyopathy that reversed with rhythm control, obesity, hypertension, alcohol and tobacco abuse who presents for follow-up.   He has now been sober for over a year and continues to participate in [employer] He still smokes a pack per day. Unfortunately he is getting a divorce, but this has been an amicable affair. He enjoys physical activity. He performs brisk walking, hiking and particularly enjoys kayaking. He has lost 34 pounds since he started exercising. he has also given up working as a Doctor, hospital.  The patient specifically denies any chest pain at rest exertion, dyspnea at rest or with exertion, orthopnea, paroxysmal nocturnal dyspnea, syncope, palpitations, focal neurological deficits, intermittent claudication, lower extremity edema, unexplained weight gain, cough, hemoptysis or wheezing.  His blood pressure today is quite high. He did not take his morning dose of carvedilol yet. He does not have a home blood pressure monitor. He has not had headaches. Stopped taking the diuretic since he didn't have any edema. He has not had any swelling in his legs or any exertional dyspnea, PND or orthopnea. It seems he is reading a reasonably sodium poor diet. He avoids luncheon meats, canned foods and prepares most of his meals himself. He actually in [employer].  Past Medical History:  Diagnosis Date  . Anxiety   . Cardiomyopathy (HCC)   . Colitis   . Depression   . High cholesterol   . Hypertension   . OCD (obsessive  compulsive disorder)   . OSA (obstructive sleep apnea) 01/22/2013  . PTSD (post-traumatic stress disorder)     Past Surgical History:  Procedure Laterality Date  . CARDIOVERSION N/A 09/15/2012   Procedure: CARDIOVERSION;  Surgeon: Chrystie Nose, MD;  Location: [facility] OR;  Service: Cardiovascular;  Laterality: N/A;    Outpatient Medications Prior to Visit  Medication Sig Dispense Refill  . albuterol (PROVENTIL HFA;VENTOLIN HFA) 108 (90 Base) MCG/ACT inhaler Inhale 2 puffs into the lungs every 4 (four) hours as needed for wheezing or shortness of breath (cough, shortness of breath or wheezing.). 1 Inhaler 1  . aspirin EC 81 MG EC tablet Take 2 tablets (162 mg total) by mouth daily. For heart health    . carvedilol (COREG) 25 MG tablet Take 1 tablet (25 mg total) by mouth 2 (two) times daily. Please schedule appointment for refills. 180 tablet 0  . cholecalciferol (VITAMIN D) 1000 units tablet Take 2 tablets (2,000 Units total) by mouth daily. For bone health    . lisinopril (PRINIVIL,ZESTRIL) 10 MG tablet TAKE 1 TABLET BY MOUTH DAILY 90 tablet 1  . Multiple Vitamin (MULTIVITAMIN) tablet Take 1 tablet by mouth daily. For low vitamin    . pravastatin (PRAVACHOL) 40 MG tablet Take 1 tablet (40 mg total) by mouth every evening. For high cholesterol 90 tablet 2  . traZODone (DESYREL) 100 MG tablet Take 1 tablet (100 mg total) by mouth at bedtime as needed for sleep. 30 tablet  0  . Vilazodone HCl (VIIBRYD) 40 MG TABS Take 1 tablet (40 mg total) by mouth daily with breakfast. For OCD/depression 30 tablet 0  . amLODipine (NORVASC) 5 MG tablet Take 1 tablet (5 mg total) by mouth daily. For high blood pressure 90 tablet 0  . amLODipine (NORVASC) 5 MG tablet TAKE 1 TABLET BY MOUTH EVERY DAY 90 tablet 0  . meloxicam (MOBIC) 15 MG tablet Take 1 tablet (15 mg total) by mouth daily. 30 tablet 1  . nicotine (NICODERM CQ - DOSED IN MG/24 HOURS) 21 mg/24hr patch Place 1 patch (21 mg total) onto the skin daily.  For smoking cessation (Patient not taking: Reported on 12/27/2015) 28 patch 0   No facility-administered medications prior to visit.      Allergies:   Statins   Social History   Social History  . Marital status: Married    Spouse name: Rockwell Zeldin  . Number of children: 0  . Years of education: Assoc. Deg   Occupational History  . wood worker Other    [employer]   Social History Main Topics  . Smoking status: Current Every Day Smoker    Packs/day: 1.00    Years: 20.00    Types: Cigarettes  . Smokeless tobacco: Never Used  . Alcohol use 6.0 oz/week    12 Standard drinks or equivalent per week     Comment: pt stated his dad made him drink when he was 78 yrs old, moonshine until he was drunk  . Drug use: No     Comment: denied all drug use  . Sexual activity: Yes    Partners: Female    Birth control/ protection: None     Comment: wife   Other Topics Concern  . Not on file   Social History Narrative   Patient lives at home with spouse.   Caffeine Use: 1-2 cups daily     Family History:  The patient's family history includes Healthy in his father and mother.   ROS:   Please see the history of present illness.    ROS All other systems reviewed and are negative.   PHYSICAL EXAM:   VS:  BP (!) 158/96   Pulse 71   Ht 5\' 9"  (1.753 m)   Wt 263 lb 3.2 oz (119.4 kg)   BMI 38.87 kg/m     General: Alert, oriented x3, no distress. Obese but also quite muscular. Head: no evidence of trauma, PERRL, EOMI, no exophtalmos or lid lag, no myxedema, no xanthelasma; normal ears, nose and oropharynx Neck: normal jugular venous pulsations and no hepatojugular reflux; brisk carotid pulses without delay and no carotid bruits Chest: clear to auscultation, no signs of consolidation by percussion or palpation, normal fremitus, symmetrical and full respiratory excursions Cardiovascular: normal position and quality of the apical impulse, regular rhythm, normal first and second heart  sounds, no murmurs, rubs or gallops Abdomen: no tenderness or distention, no masses by palpation, no abnormal pulsatility or arterial bruits, normal bowel sounds, no hepatosplenomegaly Extremities: no clubbing, cyanosis or edema; 2+ radial, ulnar and brachial pulses bilaterally; 2+ right femoral, posterior tibial and dorsalis pedis pulses; 2+ left femoral, posterior tibial and dorsalis pedis pulses; no subclavian or femoral bruits Neurological: grossly nonfocal Psych: euthymic mood, full affect  Wt Readings from Last 3 Encounters:  08/31/16 263 lb 3.2 oz (119.4 kg)  12/27/15 297 lb (134.7 kg)  10/25/15 294 lb (133.4 kg)      Studies/Labs Reviewed:   EKG:  EKG is ordered today. Shows sinus rhythm, normal tracing, QTC 432 ms   Recent Labs: No results found for requested labs within last 8760 hours.   Lipid Panel    Component Value Date/Time   CHOL 218 (H) 01/14/2015 0640   TRIG 221 (H) 01/14/2015 0640   HDL 44 01/14/2015 0640   CHOLHDL 5.0 01/14/2015 0640   VLDL 44 (H) 01/14/2015 0640   LDLCALC 130 (H) 01/14/2015 0640    ASSESSMENT:    1. Atrial flutter with rapid ventricular response (HCC)   2. Essential hypertension   3. Severe obesity (BMI 35.0-35.9 with comorbidity) (HCC)   4. Alcoholism in remission (HCC)   5. Tobacco use      PLAN:  In order of problems listed above:  1. Atrial flutter: He has not had atrial flutter in a long time, at least that he is aware of. Congratulated on weight loss which will definitely be beneficial to prevent future arrhythmia. CHADSVasc 1 for HTN: Discussed pros and cons and he prefers not to take a full anticoagulant. He is taking aspirin 81 mg daily. 2. HTN: blood pressure control is poor, despite the fact that he has lost a lot of weight and is physically active. It's possible that this is because he is no longer taking a diuretic. Increase amlodipine to 10 mg daily. Would like to get a few more blood pressure readings before making any  other significant changes in medication. He will buy a monitor. 3. Obesity: Congratulated him of the great toe present so far, recommended he keep pursuing additional weight loss 4. Congratulated him on abstinence from alcohol. 5. He does not appear to be ready to commit to a program of smoking cessation.   Medication Adjustments/Labs and Tests Ordered: Current medicines are reviewed at length with the patient today.  Concerns regarding medicines are outlined above.  Medication changes, Labs and Tests ordered today are listed in the Patient Instructions below. Patient Instructions  Medication Instructions:  INCREASE- Amlodipine 10 mg daily  If you need a refill on your cardiac medications before your next appointment, please call your pharmacy.  Labwork: None Ordered  Testing/Procedures: None Ordered  Follow-Up: Your physician wants you to follow-up in: 3 Months with Dr Royann Shivers   Special Instructions: Keep reading of blood pressure and upload through your MyChart   Thank you for choosing [facility]!!           Signed, Thurmon Fair, MD  09/01/2016 5:02 PM    [facility] [address] [place], [place], [place]  [address] Phone: [phone]; Fax: [phone]

## 2016-09-01 ENCOUNTER — Encounter: Payer: Self-pay | Admitting: Cardiovascular Disease

## 2016-09-01 DIAGNOSIS — Z6835 Body mass index (BMI) 35.0-35.9, adult: Secondary | ICD-10-CM

## 2016-09-01 DIAGNOSIS — F1021 Alcohol dependence, in remission: Secondary | ICD-10-CM | POA: Insufficient documentation

## 2016-09-07 ENCOUNTER — Other Ambulatory Visit: Payer: Self-pay | Admitting: Cardiovascular Disease

## 2016-11-26 ENCOUNTER — Other Ambulatory Visit: Payer: Self-pay | Admitting: Cardiovascular Disease

## 2016-11-26 NOTE — Telephone Encounter (Signed)
REFILL 

## 2016-12-07 ENCOUNTER — Ambulatory Visit: Payer: Self-pay | Admitting: Cardiovascular Disease

## 2016-12-15 ENCOUNTER — Other Ambulatory Visit: Payer: Self-pay | Admitting: Cardiovascular Disease

## 2016-12-16 IMAGING — DX DG FOOT 2V*R*
2 series · 2 of 2 positions shown · non-contrast
Comparison: None.

CLINICAL DATA: Pain for 1 month

EXAM:
RIGHT FOOT - 2 VIEW

[foot ap]
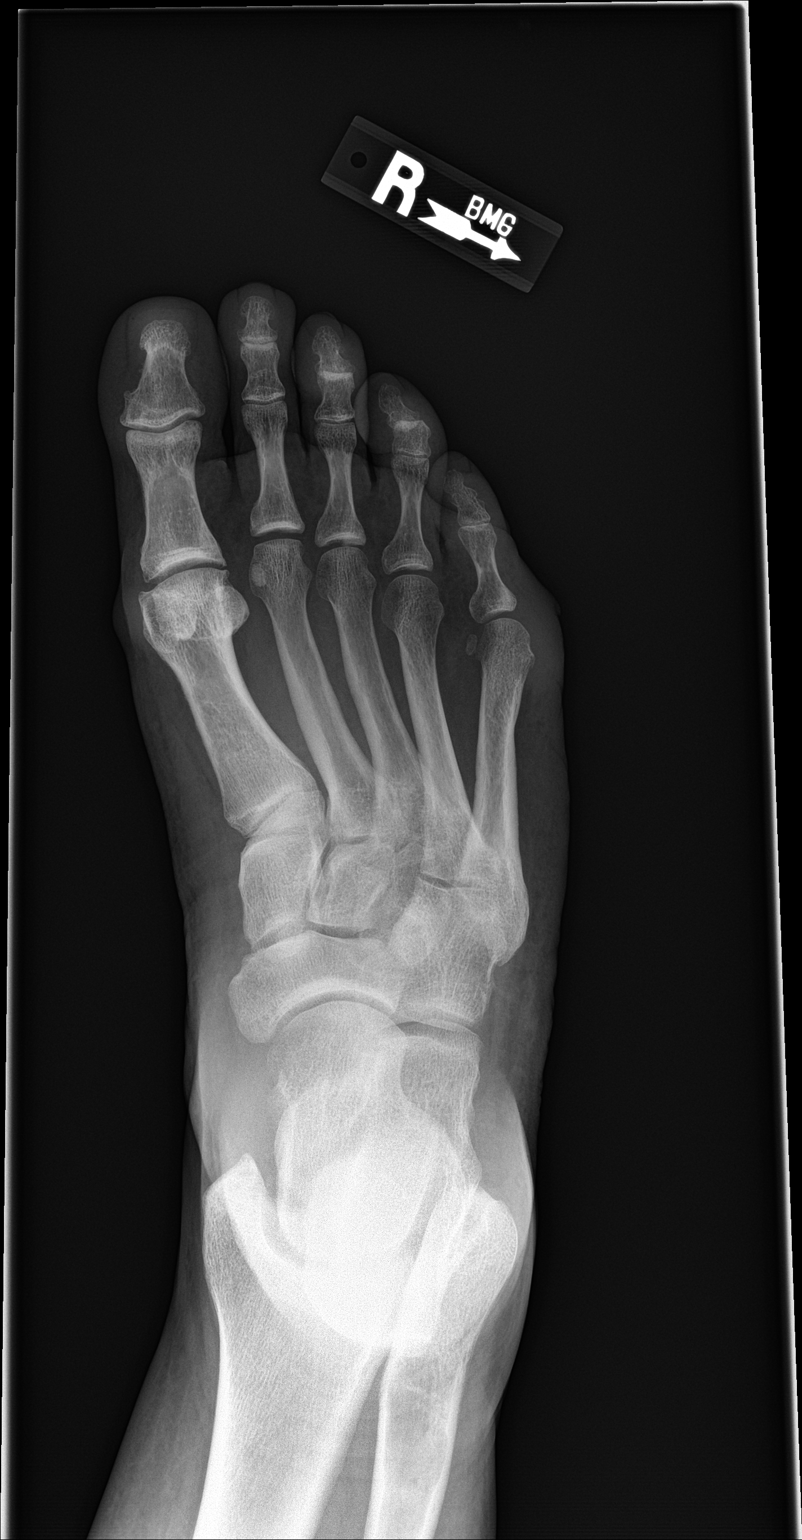

[foot lat]
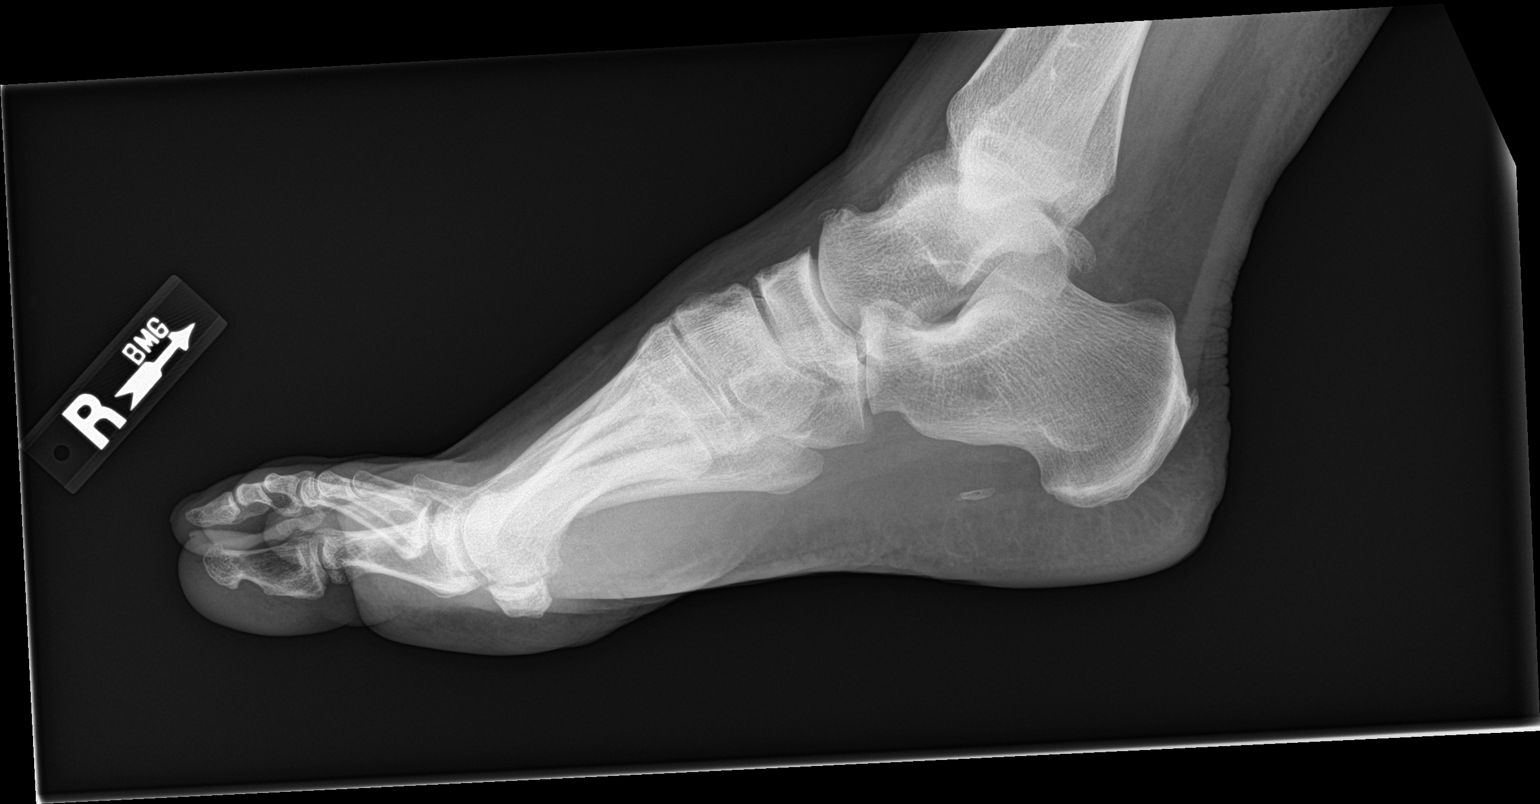

[2 of 2 positions shown; findings below may reference images not displayed]

FINDINGS: No acute fracture. No dislocation. Unremarkable soft tissues. There
is a small bony density inferior to the calcaneus likely related to
calcific tendinosis. Mild degenerative change at the first
metatarsophalangeal joint. Chronic tiny bony density is dorsal to
the distal talus.
IMPRESSION: No acute bony pathology.  Chronic changes.

## 2017-02-20 ENCOUNTER — Other Ambulatory Visit: Payer: Self-pay

## 2017-02-20 ENCOUNTER — Encounter (HOSPITAL_COMMUNITY): Payer: Self-pay

## 2017-02-20 ENCOUNTER — Observation Stay (HOSPITAL_COMMUNITY)
Admission: EM | Admit: 2017-02-20 | Discharge: 2017-02-21 | DRG: 897 | Disposition: A | Discharge status: Home / Self Care | Payer: Self-pay | Attending: Internal Medicine | Admitting: Internal Medicine

## 2017-02-20 DIAGNOSIS — T6591XA Toxic effect of unspecified substance, accidental (unintentional), initial encounter: Secondary | ICD-10-CM | POA: Diagnosis present

## 2017-02-20 DIAGNOSIS — Z79899 Other long term (current) drug therapy: Secondary | ICD-10-CM

## 2017-02-20 DIAGNOSIS — T50904A Poisoning by unspecified drugs, medicaments and biological substances, undetermined, initial encounter: Secondary | ICD-10-CM

## 2017-02-20 DIAGNOSIS — G4733 Obstructive sleep apnea (adult) (pediatric): Secondary | ICD-10-CM | POA: Diagnosis present

## 2017-02-20 DIAGNOSIS — E7800 Pure hypercholesterolemia, unspecified: Secondary | ICD-10-CM | POA: Diagnosis present

## 2017-02-20 DIAGNOSIS — I10 Essential (primary) hypertension: Secondary | ICD-10-CM

## 2017-02-20 DIAGNOSIS — I4892 Unspecified atrial flutter: Secondary | ICD-10-CM

## 2017-02-20 DIAGNOSIS — F209 Schizophrenia, unspecified: Secondary | ICD-10-CM | POA: Diagnosis present

## 2017-02-20 DIAGNOSIS — Y908 Blood alcohol level of 240 mg/100 ml or more: Secondary | ICD-10-CM | POA: Diagnosis present

## 2017-02-20 DIAGNOSIS — T6594XA Toxic effect of unspecified substance, undetermined, initial encounter: Secondary | ICD-10-CM

## 2017-02-20 DIAGNOSIS — F10229 Alcohol dependence with intoxication, unspecified: Principal | ICD-10-CM

## 2017-02-20 DIAGNOSIS — T43224A Poisoning by selective serotonin reuptake inhibitors, undetermined, initial encounter: Secondary | ICD-10-CM | POA: Diagnosis present

## 2017-02-20 DIAGNOSIS — F10929 Alcohol use, unspecified with intoxication, unspecified: Secondary | ICD-10-CM

## 2017-02-20 DIAGNOSIS — F17210 Nicotine dependence, cigarettes, uncomplicated: Secondary | ICD-10-CM | POA: Diagnosis present

## 2017-02-20 DIAGNOSIS — F429 Obsessive-compulsive disorder, unspecified: Secondary | ICD-10-CM | POA: Diagnosis present

## 2017-02-20 DIAGNOSIS — Z888 Allergy status to other drugs, medicaments and biological substances status: Secondary | ICD-10-CM

## 2017-02-20 DIAGNOSIS — F4310 Post-traumatic stress disorder, unspecified: Secondary | ICD-10-CM

## 2017-02-20 DIAGNOSIS — F332 Major depressive disorder, recurrent severe without psychotic features: Secondary | ICD-10-CM

## 2017-02-20 LAB — CBC WITH DIFFERENTIAL/PLATELET
BASOS ABS: 0.1 10*3/uL (ref 0.0–0.1)
BASOS PCT: 1 %
EOS ABS: 0.1 10*3/uL (ref 0.0–0.7)
EOS PCT: 1 %
HCT: 51.3 % (ref 39.0–52.0)
Hemoglobin: 17.7 g/dL — ABNORMAL HIGH (ref 13.0–17.0)
LYMPHS ABS: 3.1 10*3/uL (ref 0.7–4.0)
Lymphocytes Relative: 31 %
MCH: 31.7 pg (ref 26.0–34.0)
MCHC: 34.5 g/dL (ref 30.0–36.0)
MCV: 91.8 fL (ref 78.0–100.0)
Monocytes Absolute: 0.5 10*3/uL (ref 0.1–1.0)
Monocytes Relative: 5 %
Neutro Abs: 6.3 10*3/uL (ref 1.7–7.7)
Neutrophils Relative %: 62 %
PLATELETS: 321 10*3/uL (ref 150–400)
RBC: 5.59 MIL/uL (ref 4.22–5.81)
RDW: 13.5 % (ref 11.5–15.5)
WBC: 10.1 10*3/uL (ref 4.0–10.5)

## 2017-02-20 LAB — COMPREHENSIVE METABOLIC PANEL
ALT: 38 U/L (ref 17–63)
ANION GAP: 20 — AB (ref 5–15)
AST: 48 U/L — ABNORMAL HIGH (ref 15–41)
Albumin: 4.5 g/dL (ref 3.5–5.0)
Alkaline Phosphatase: 69 U/L (ref 38–126)
BUN: 18 mg/dL (ref 6–20)
CHLORIDE: 103 mmol/L (ref 101–111)
CO2: 21 mmol/L — ABNORMAL LOW (ref 22–32)
CREATININE: 0.72 mg/dL (ref 0.61–1.24)
Calcium: 8.6 mg/dL — ABNORMAL LOW (ref 8.9–10.3)
GFR calc non Af Amer: >60 mL/min (ref >60)
Glucose, Bld: 99 mg/dL (ref 65–99)
Potassium: 4.1 mmol/L (ref 3.5–5.1)
SODIUM: 144 mmol/L (ref 135–145)
Total Bilirubin: 0.7 mg/dL (ref 0.3–1.2)
Total Protein: 8.3 g/dL — ABNORMAL HIGH (ref 6.5–8.1)

## 2017-02-20 LAB — SALICYLATE LEVEL

## 2017-02-20 LAB — ACETAMINOPHEN LEVEL

## 2017-02-20 LAB — RAPID URINE DRUG SCREEN, HOSP PERFORMED
Amphetamines: NOT DETECTED
BENZODIAZEPINES: NOT DETECTED
Barbiturates: NOT DETECTED
COCAINE: NOT DETECTED
Opiates: NOT DETECTED
Tetrahydrocannabinol: NOT DETECTED

## 2017-02-20 LAB — ETHANOL: Alcohol, Ethyl (B): 350 mg/dL (ref <10)

## 2017-02-20 LAB — CBG MONITORING, ED: Glucose-Capillary: 103 mg/dL — ABNORMAL HIGH (ref 65–99)

## 2017-02-20 MED ORDER — IBUPROFEN 600 MG PO TABS: 600.0000 mg | ORAL_TABLET | Freq: Three times a day (TID) | ORAL | Status: DC | PRN

## 2017-02-20 MED ORDER — ONDANSETRON HCL 4 MG/2ML IJ SOLN: 4.0000 mg | Freq: Four times a day (QID) | INTRAMUSCULAR | Status: DC | PRN

## 2017-02-20 MED ORDER — VITAMIN B-1 100 MG PO TABS
100.0000 mg | ORAL_TABLET | Freq: Every day | ORAL | Status: DC
  Administered 2017-02-21: 100 mg via ORAL
  Filled 2017-02-20: qty 1

## 2017-02-20 MED ORDER — BISACODYL 5 MG PO TBEC: 5.0000 mg | DELAYED_RELEASE_TABLET | Freq: Every day | ORAL | Status: DC | PRN

## 2017-02-20 MED ORDER — THIAMINE HCL 100 MG/ML IJ SOLN: 100.0000 mg | Freq: Every day | INTRAMUSCULAR | Status: DC

## 2017-02-20 MED ORDER — CARVEDILOL 12.5 MG PO TABS
25.0000 mg | ORAL_TABLET | Freq: Two times a day (BID) | ORAL | Status: DC
  Administered 2017-02-20 – 2017-02-21 (×2): 25 mg via ORAL
  Filled 2017-02-20 (×2): qty 2

## 2017-02-20 MED ORDER — LORAZEPAM 1 MG PO TABS: 1.0000 mg | ORAL_TABLET | Freq: Four times a day (QID) | ORAL | Status: DC | PRN

## 2017-02-20 MED ORDER — ONDANSETRON HCL 4 MG PO TABS: 4.0000 mg | ORAL_TABLET | Freq: Four times a day (QID) | ORAL | Status: DC | PRN

## 2017-02-20 MED ORDER — VITAMIN D 1000 UNITS PO TABS
2000.0000 [IU] | ORAL_TABLET | Freq: Every day | ORAL | Status: DC
  Administered 2017-02-21: 2000 [IU] via ORAL
  Filled 2017-02-20 (×3): qty 2

## 2017-02-20 MED ORDER — LORAZEPAM 2 MG/ML IJ SOLN: 0.0000 mg | Freq: Two times a day (BID) | INTRAMUSCULAR | Status: DC

## 2017-02-20 MED ORDER — LORAZEPAM 2 MG/ML IJ SOLN
0.0000 mg | Freq: Four times a day (QID) | INTRAMUSCULAR | Status: DC
  Administered 2017-02-21: 1 mg via INTRAVENOUS
  Filled 2017-02-20: qty 1

## 2017-02-20 MED ORDER — LORAZEPAM 1 MG PO TABS
1.0000 mg | ORAL_TABLET | ORAL | Status: AC | PRN
  Administered 2017-02-20: 1 mg via ORAL
  Filled 2017-02-20: qty 1

## 2017-02-20 MED ORDER — ZIPRASIDONE MESYLATE 20 MG IM SOLR: 20.0000 mg | INTRAMUSCULAR | Status: DC | PRN

## 2017-02-20 MED ORDER — ALBUTEROL SULFATE HFA 108 (90 BASE) MCG/ACT IN AERS
2.0000 | INHALATION_SPRAY | RESPIRATORY_TRACT | Status: DC | PRN
  Filled 2017-02-20: qty 6.7

## 2017-02-20 MED ORDER — LORAZEPAM 2 MG/ML IJ SOLN: 1.0000 mg | Freq: Four times a day (QID) | INTRAMUSCULAR | Status: DC | PRN

## 2017-02-20 MED ORDER — SODIUM CHLORIDE 0.9 % IV BOLUS (SEPSIS)
1000.0000 mL | Freq: Once | INTRAVENOUS | Status: AC
  Administered 2017-02-20: 1000 mL via INTRAVENOUS

## 2017-02-20 MED ORDER — VILAZODONE HCL 40 MG PO TABS
40.0000 mg | ORAL_TABLET | Freq: Every day | ORAL | Status: DC
Start: 2017-02-21 — End: 2017-02-20

## 2017-02-20 MED ORDER — OLANZAPINE 10 MG IM SOLR
5.0000 mg | Freq: Once | INTRAMUSCULAR | Status: DC | PRN
  Filled 2017-02-20: qty 10

## 2017-02-20 MED ORDER — AMLODIPINE BESYLATE 5 MG PO TABS
10.0000 mg | ORAL_TABLET | Freq: Every day | ORAL | Status: DC
  Administered 2017-02-21: 10 mg via ORAL
  Filled 2017-02-20: qty 2

## 2017-02-20 MED ORDER — ONDANSETRON HCL 4 MG PO TABS: 4.0000 mg | ORAL_TABLET | Freq: Three times a day (TID) | ORAL | Status: DC | PRN

## 2017-02-20 MED ORDER — SENNOSIDES-DOCUSATE SODIUM 8.6-50 MG PO TABS: 1.0000 | ORAL_TABLET | Freq: Every evening | ORAL | Status: DC | PRN

## 2017-02-20 MED ORDER — ONE-DAILY MULTI VITAMINS PO TABS: 1.0000 | ORAL_TABLET | Freq: Every day | ORAL | Status: DC

## 2017-02-20 MED ORDER — SODIUM CHLORIDE 0.9% FLUSH
3.0000 mL | Freq: Two times a day (BID) | INTRAVENOUS | Status: DC
  Administered 2017-02-21: 3 mL via INTRAVENOUS

## 2017-02-20 MED ORDER — LISINOPRIL 10 MG PO TABS
10.0000 mg | ORAL_TABLET | Freq: Every day | ORAL | Status: DC
  Administered 2017-02-21: 10 mg via ORAL
  Filled 2017-02-20: qty 1

## 2017-02-20 MED ORDER — ADULT MULTIVITAMIN W/MINERALS CH
1.0000 | ORAL_TABLET | Freq: Every day | ORAL | Status: DC
  Administered 2017-02-21: 1 via ORAL
  Filled 2017-02-20: qty 1

## 2017-02-20 MED ORDER — SODIUM CHLORIDE 0.9 % IV SOLN
INTRAVENOUS | Status: AC
  Administered 2017-02-21 (×2): via INTRAVENOUS

## 2017-02-20 MED ORDER — ALUM & MAG HYDROXIDE-SIMETH 200-200-20 MG/5ML PO SUSP: 30.0000 mL | Freq: Four times a day (QID) | ORAL | Status: DC | PRN

## 2017-02-20 MED ORDER — OLANZAPINE 5 MG PO TBDP
10.0000 mg | ORAL_TABLET | Freq: Three times a day (TID) | ORAL | Status: DC | PRN
  Administered 2017-02-20: 10 mg via ORAL
  Filled 2017-02-20: qty 2

## 2017-02-20 MED ORDER — FOLIC ACID 1 MG PO TABS
1.0000 mg | ORAL_TABLET | Freq: Every day | ORAL | Status: DC
  Administered 2017-02-21: 1 mg via ORAL
  Filled 2017-02-20: qty 1

## 2017-02-20 MED ORDER — SODIUM CHLORIDE 0.9 % IV BOLUS (SEPSIS)
500.0000 mL | Freq: Once | INTRAVENOUS | Status: AC
  Administered 2017-02-21: 500 mL via INTRAVENOUS

## 2017-02-20 MED ORDER — ASPIRIN EC 81 MG PO TBEC
162.0000 mg | DELAYED_RELEASE_TABLET | Freq: Every day | ORAL | Status: DC
  Administered 2017-02-21: 162 mg via ORAL
  Filled 2017-02-20: qty 2

## 2017-02-20 MED ORDER — ENOXAPARIN SODIUM 40 MG/0.4ML ~~LOC~~ SOLN
40.0000 mg | SUBCUTANEOUS | Status: DC
  Administered 2017-02-21: 40 mg via SUBCUTANEOUS
  Filled 2017-02-20: qty 0.4

## 2017-02-20 NOTE — ED Notes (Signed)
CRITICAL VALUE ALERT  Critical Value:  Alcohol Level 350   Date & Time Notied:  02/20/17 at 1925  Provider Notified: Hyacinth Meeker, EDP  Orders Received/Actions taken: No orders at this time

## 2017-02-20 NOTE — ED Notes (Signed)
Sitter at bedside. Patient trying to get out of bed. Patient is not easily redirected.

## 2017-02-20 NOTE — ED Notes (Signed)
Pt's personal items removed. Pt's pants, socks, coveralls placed in belonging bag. Pt's cross necklace on black string and watch given to pt's friend at bedside.

## 2017-02-20 NOTE — ED Notes (Signed)
Pt's friend at bedside, who brought pt in from home, would like to speak with TTS. Nicholaus Bloom [place]iderhiser, [phone].

## 2017-02-20 NOTE — BH Assessment (Signed)
TTS called to see if the pt has been medically cleared, per RN pt has not been medically cleared.  TTS to try again later.   Annamaria Boots, MS, [facility] Therapeutic Triage Specialist

## 2017-02-20 NOTE — ED Notes (Signed)
Pt's friend who brought pt into ED, Nicholaus Bloom, reports that pt can get very upset and violent when he can't find his phone and keys. Nicholaus Bloom said that if pt starts to get upset about these items then to tell him that they are at his house and she is looking for them.

## 2017-02-20 NOTE — ED Triage Notes (Signed)
cbg 113 per ems.

## 2017-02-20 NOTE — ED Notes (Signed)
EDP at bedside  

## 2017-02-20 NOTE — ED Notes (Signed)
Per Dr. Hyacinth Meeker, pt's friend, Ardell Isaacs, is allowed back to see patient outside normal visitation hours to help keep pt calm. Dr. Hyacinth Meeker also states that if pt gets agitated or violent then Nicholaus Bloom will need to leave. Nicholaus Bloom is aware of these guidelines.

## 2017-02-20 NOTE — H&P (Signed)
History and Physical    Malik Edwards BJY:[id number] DOB: 06-Apr-1975 DOA: 02/20/2017  PCP: Elvina Sidle, MD   Patient coming from: Home  Chief Complaint: Lethargy, alcohol intoxication   HPI: Malik Edwards is a 42 y.o. male with medical history significant for depression, PTSD, hypertension, and paroxysmal atrial flutter, now presenting to the emergency department lethargic and despondent.  Patient lives alone and a friend checked on him yesterday, found him to be naked and drinking heavily.  She spoke with his psychiatrist and went back to check on him again today with paramedics, found him to be in a similar condition as the day before, still make it, with open liquor bottles, and crying inconsolably.  Patient was found to have a normal blood sugar and normal vitals and brought into the ED.  ED Course: Upon arrival to the ED, patient is found to be afebrile, mildly tachycardic, and with vitals otherwise stable.  EKG features a sinus tachycardia with rate 103 and left axis deviation.  Chemistry panel and CBC are unremarkable.  Ethanol level is elevated to 350.  Salicylate and acetaminophen levels are undetectable and UDS is negative.  Patient became increasingly tachycardic in the emergency department and ED physician discussed the case with poison control.  Poison control raised concern that Viibryd overdose could cause the tachycardia, noted that this could also cause fever, and recommended continued in hospital observation.  Psychiatry was also consulted by the ED physician.  Patient will be observed on the telemetry unit for ongoing evaluation depression, acute alcohol intoxication, and suspected ingestion.  Review of Systems:  Unable to complete ROS secondary to the patient's clinical condition.  Past Medical History:  Diagnosis Date  . Anxiety   . Cardiomyopathy (HCC)   . Colitis   . Depression   . High cholesterol   . Hypertension   . OCD (obsessive compulsive disorder)    . OSA (obstructive sleep apnea) 01/22/2013  . PTSD (post-traumatic stress disorder)     Past Surgical History:  Procedure Laterality Date  . CARDIOVERSION N/A 09/15/2012   Procedure: CARDIOVERSION;  Surgeon: Chrystie Nose, MD;  Location: [facility] OR;  Service: Cardiovascular;  Laterality: N/A;     reports that he has been smoking cigarettes.  He has a 20.00 pack-year smoking history. he has never used smokeless tobacco. He reports that he drinks about 6.0 oz of alcohol per week. He reports that he does not use drugs.  Allergies  Allergen Reactions  . Statins     Elevated LFTs  (Lipitor specifically)    Family History  Problem Relation Age of Onset  . Healthy Mother   . Healthy Father   . Sudden Cardiac Death Neg Hx      Prior to Admission medications   Medication Sig Start Date End Date Taking? Authorizing Provider  albuterol (PROVENTIL HFA;VENTOLIN HFA) 108 (90 Base) MCG/ACT inhaler Inhale 2 puffs into the lungs every 4 (four) hours as needed for wheezing or shortness of breath (cough, shortness of breath or wheezing.). 10/25/15   Elvina Sidle, MD  amLODipine (NORVASC) 10 MG tablet Take 1 tablet (10 mg total) by mouth daily. For high blood pressure 08/31/16   Croitoru, Mihai, MD  aspirin EC 81 MG EC tablet Take 2 tablets (162 mg total) by mouth daily. For heart health 09/01/15   Armandina Stammer I, NP  carvedilol (COREG) 25 MG tablet Take 1 tablet (25 mg total) by mouth 2 (two) times daily. KEEP OV. 11/26/16   Croitoru, [place],  MD  cholecalciferol (VITAMIN D) 1000 units tablet Take 2 tablets (2,000 Units total) by mouth daily. For bone health 09/01/15   Armandina Stammer I, NP  lisinopril (PRINIVIL,ZESTRIL) 10 MG tablet TAKE 1 TABLET BY MOUTH DAILY 12/17/16   Croitoru, Mihai, MD  Multiple Vitamin (MULTIVITAMIN) tablet Take 1 tablet by mouth daily. For low vitamin 09/01/15   Armandina Stammer I, NP  pravastatin (PRAVACHOL) 40 MG tablet Take 1 tablet (40 mg total) by mouth every evening. For high  cholesterol 06/29/16   Croitoru, Mihai, MD  traZODone (DESYREL) 100 MG tablet Take 1 tablet (100 mg total) by mouth at bedtime as needed for sleep. 09/01/15   Armandina Stammer I, NP  Vilazodone HCl (VIIBRYD) 40 MG TABS Take 1 tablet (40 mg total) by mouth daily with breakfast. For OCD/depression 09/01/15   Armandina Stammer I, NP    Physical Exam: Vitals:   02/20/17 2230 02/20/17 2245 02/20/17 2308 02/20/17 2311  BP: (!) 145/88  105/66 105/66  Pulse:  (!) 108 (!) 108 (!) 108  Resp: (!) 23 18 16    Temp:   98 F (36.7 C)   TempSrc:   Oral   SpO2:  94% 94%       Constitutional: NAD, somnolent Eyes: PERTLA, lids and conjunctivae normal ENMT: Mucous membranes are moist. Posterior pharynx clear of any exudate or lesions.   Neck: normal, supple, no masses, no thyromegaly Respiratory: clear to auscultation bilaterally, no wheezing, no crackles. Normal respiratory effort.  Cardiovascular: Rate ~120 and regular. No significant JVD. Abdomen: No distension, no tenderness, no masses palpated. Bowel sounds normal.  Musculoskeletal: no clubbing / cyanosis. No joint deformity upper and lower extremities.   Skin: no significant rashes, lesions, ulcers. Warm, dry, well-perfused. Neurologic: CN 2-12 grossly intact. Sensation intact. Strength 5/5 in all 4 limbs.  Psychiatric: Somnolent, easily roused and oriented x 3.      Labs on Admission: I have personally reviewed following labs and imaging studies  CBC: Recent Labs  Lab 02/20/17 1750  WBC 10.1  NEUTROABS 6.3  HGB 17.7*  HCT 51.3  MCV 91.8  PLT 321   Basic Metabolic Panel: Recent Labs  Lab 02/20/17 1750  NA 144  K 4.1  CL 103  CO2 21*  GLUCOSE 99  BUN 18  CREATININE 0.72  CALCIUM 8.6*   GFR: CrCl cannot be calculated (Unknown ideal weight.). Liver Function Tests: Recent Labs  Lab 02/20/17 1750  AST 48*  ALT 38  ALKPHOS 69  BILITOT 0.7  PROT 8.3*  ALBUMIN 4.5   No results for input(s): LIPASE, AMYLASE in the last 168  hours. No results for input(s): AMMONIA in the last 168 hours. Coagulation Profile: No results for input(s): INR, PROTIME in the last 168 hours. Cardiac Enzymes: No results for input(s): CKTOTAL, CKMB, CKMBINDEX, TROPONINI in the last 168 hours. BNP (last 3 results) No results for input(s): PROBNP in the last 8760 hours. HbA1C: No results for input(s): HGBA1C in the last 72 hours. CBG: Recent Labs  Lab 02/20/17 1746  GLUCAP 103*   Lipid Profile: No results for input(s): CHOL, HDL, LDLCALC, TRIG, CHOLHDL, LDLDIRECT in the last 72 hours. Thyroid Function Tests: No results for input(s): TSH, T4TOTAL, FREET4, T3FREE, THYROIDAB in the last 72 hours. Anemia Panel: No results for input(s): VITAMINB12, FOLATE, FERRITIN, TIBC, IRON, RETICCTPCT in the last 72 hours. Urine analysis:    Component Value Date/Time   COLORURINE YELLOW 10/14/2008 1919   APPEARANCEUR CLEAR 10/14/2008 1919   LABSPEC 1.021 10/14/2008  1919   PHURINE 6.0 10/14/2008 1919   GLUCOSEU NEGATIVE 10/14/2008 1919   HGBUR NEGATIVE 10/14/2008 1919   BILIRUBINUR NEGATIVE 10/14/2008 1919   KETONESUR NEGATIVE 10/14/2008 1919   PROTEINUR 30 (A) 10/14/2008 1919   UROBILINOGEN 0.2 10/14/2008 1919   NITRITE NEGATIVE 10/14/2008 1919   LEUKOCYTESUR NEGATIVE 10/14/2008 1919   Sepsis Labs: @LABRCNTIP (procalcitonin:4,lacticidven:4) )No results found for this or any previous visit (from the past 240 hour(s)).   Radiological Exams on Admission: No results found.  EKG: Independently reviewed. Sinus tachycardia (rate 103), LAD.   Assessment/Plan  1. Suspected ingestion  - Presents lethargic with EtOH level 350; has been despondent per a friend who has been checking on him periodically  - He was monitored for several hours in ED and became increasingly tachycardic, raising concern for ingestion  - ED physician discussed case with poison-control, vilazodone ingestion could cause tachycardia and also fever  - Poison-control  recommended continued observation and prn benzodiazepines - Psychiatry has been consulted, continue suicide precautions, sitter   2. Alcohol dependence, acute intoxication  - Presents lethargic with EtOH level 350  - Will monitor for withdrawal in hospital with CIWA, prn Ativan    3. Hypertension  - BP at goal  - Continue Coreg and Norvasc    4. Depression, PTSD  - Managed at home with vilazodone and trazodone  - These are held on admission out of concern for possible overdose  5. Paroxysmal atrial flutter  - Follows with cardiology, no a flutter in years  - Reportedly had a low EF that resolved with rhythm-control  - CHADS-VASc is 1 (HTN)  - Continue beta-blocker     DVT prophylaxis: Lovenox Code Status: Full  Family Communication: Discussed with patient Disposition Plan: Observe on telemetry Consults called: Psychiatry Admission status: Observation    Briscoe Deutscher, MD [facility] Pager [phone]  If 7PM-7AM, please contact night-coverage [email or web address] Password [facility]  02/20/2017, 11:16 PM

## 2017-02-20 NOTE — ED Notes (Signed)
Patient given water to drink at this time.  
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
## 2017-02-21 DIAGNOSIS — F10220 Alcohol dependence with intoxication, uncomplicated: Secondary | ICD-10-CM

## 2017-02-21 DIAGNOSIS — T6591XD Toxic effect of unspecified substance, accidental (unintentional), subsequent encounter: Secondary | ICD-10-CM

## 2017-02-21 LAB — CBC WITH DIFFERENTIAL/PLATELET
Basophils Absolute: 0 10*3/uL (ref 0.0–0.1)
Basophils Relative: 0 %
Eosinophils Absolute: 0.2 10*3/uL (ref 0.0–0.7)
Eosinophils Relative: 2 %
HEMATOCRIT: 42 % (ref 39.0–52.0)
Hemoglobin: 14.2 g/dL (ref 13.0–17.0)
LYMPHS ABS: 2.9 10*3/uL (ref 0.7–4.0)
LYMPHS PCT: 31 %
MCH: 30.5 pg (ref 26.0–34.0)
MCHC: 33.8 g/dL (ref 30.0–36.0)
MCV: 90.1 fL (ref 78.0–100.0)
Monocytes Absolute: 0.7 10*3/uL (ref 0.1–1.0)
Monocytes Relative: 8 %
NEUTROS ABS: 5.7 10*3/uL (ref 1.7–7.7)
Neutrophils Relative %: 59 %
PLATELETS: 260 10*3/uL (ref 150–400)
RBC: 4.66 MIL/uL (ref 4.22–5.81)
RDW: 13.3 % (ref 11.5–15.5)
WBC: 9.6 10*3/uL (ref 4.0–10.5)

## 2017-02-21 LAB — COMPREHENSIVE METABOLIC PANEL
ALBUMIN: 3.6 g/dL (ref 3.5–5.0)
ALT: 34 U/L (ref 17–63)
AST: 38 U/L (ref 15–41)
Alkaline Phosphatase: 55 U/L (ref 38–126)
Anion gap: 13 (ref 5–15)
BUN: 18 mg/dL (ref 6–20)
CHLORIDE: 105 mmol/L (ref 101–111)
CO2: 22 mmol/L (ref 22–32)
Calcium: 8.4 mg/dL — ABNORMAL LOW (ref 8.9–10.3)
Creatinine, Ser: 0.71 mg/dL (ref 0.61–1.24)
GFR calc Af Amer: >60 mL/min (ref >60)
GLUCOSE: 170 mg/dL — AB (ref 65–99)
POTASSIUM: 3.4 mmol/L — AB (ref 3.5–5.1)
Sodium: 140 mmol/L (ref 135–145)
Total Bilirubin: 0.5 mg/dL (ref 0.3–1.2)
Total Protein: 6.5 g/dL (ref 6.5–8.1)

## 2017-02-21 MED ORDER — ALBUTEROL SULFATE (2.5 MG/3ML) 0.083% IN NEBU
3.0000 mL | INHALATION_SOLUTION | RESPIRATORY_TRACT | Status: DC | PRN
Start: 2017-02-21 — End: 2017-02-21

## 2017-02-21 MED ORDER — THIAMINE HCL 100 MG PO TABS: 100.0000 mg | ORAL_TABLET | Freq: Every day | ORAL | Status: DC

## 2017-02-21 MED ORDER — FOLIC ACID 1 MG PO TABS: 1.0000 mg | ORAL_TABLET | Freq: Every day | ORAL | Status: DC

## 2017-02-21 NOTE — Progress Notes (Signed)
Patient is to be discharged home and in stable condition. Patient's IV and telemetry removed, WNL. Patient given discharge instructions and verbalized understanding. Patient awaiting transportation at this time.  Claritza July P Dishmon, RN  
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
## 2017-02-21 NOTE — Discharge Summary (Signed)
Physician Discharge Summary  Malik Edwards VKP:[id number] DOB: 11-03-1975 DOA: 02/20/2017  PCP: Elvina Sidle, MD  Admit date: 02/20/2017 Discharge date: 02/21/2017  Time spent: 45 minutes  Recommendations for Outpatient Follow-up:  -Will be discharged home today.  Has been cleared by psychiatry for discharge home. -We will ask social work to provide with outpatient alcohol resources.  Discharge Diagnoses:  Principal Problem:   Ingestion of substance Active Problems:   Paroxysmal atrial flutter (HCC)   HTN (hypertension)   OSA (obstructive sleep apnea)   PTSD (post-traumatic stress disorder)   MDD (major depressive disorder), recurrent severe, without psychosis (HCC)   Alcohol dependence with acute alcoholic intoxication (HCC)   Discharge Condition: Stable and improved  There were no vitals filed for this visit.  History of present illness:  As per Dr. Antionette Char on 1/23: Malik Edwards is a 42 y.o. male with medical history significant for depression, PTSD, hypertension, and paroxysmal atrial flutter, now presenting to the emergency department lethargic and despondent.  Patient lives alone and a friend checked on him yesterday, found him to be naked and drinking heavily.  She spoke with his psychiatrist and went back to check on him again today with paramedics, found him to be in a similar condition as the day before, still make it, with open liquor bottles, and crying inconsolably.  Patient was found to have a normal blood sugar and normal vitals and brought into the ED.  ED Course: Upon arrival to the ED, patient is found to be afebrile, mildly tachycardic, and with vitals otherwise stable.  EKG features a sinus tachycardia with rate 103 and left axis deviation.  Chemistry panel and CBC are unremarkable.  Ethanol level is elevated to 350.  Salicylate and acetaminophen levels are undetectable and UDS is negative.  Patient became increasingly tachycardic in the emergency  department and ED physician discussed the case with poison control.  Poison control raised concern that Viibryd overdose could cause the tachycardia, noted that this could also cause fever, and recommended continued in hospital observation.  Psychiatry was also consulted by the ED physician.  Patient will be observed on the telemetry unit for ongoing evaluation depression, acute alcohol intoxication, and suspected ingestion.    Hospital Course:   Alcohol dependence with acute alcoholic intoxication -He presented lethargic with an EtOH level of 350. -By the time I see him today he is alert oriented sitting at bedside watching TV, he states that he was binge drinking that day because it was his birthday, he did not do this to kill himself and is unclear where people got that idea. -Has been seen by cardiology who has medically cleared him. -Social work has been asked to provide resources for outpatient alcohol addiction.  Depression -Continue Viibryd.  Hypertension -At goal, continue Coreg and Norvasc.  Procedures:  None   Consultations:  Psychiatry  Discharge Instructions  Discharge Instructions    Diet - low sodium heart healthy   Complete by:  As directed    Increase activity slowly   Complete by:  As directed      Allergies as of 02/21/2017      Reactions   Gabapentin Swelling   Statins    Elevated LFTs  (Lipitor specifically)      Medication List    TAKE these medications   albuterol 108 (90 Base) MCG/ACT inhaler Commonly known as:  PROVENTIL HFA;VENTOLIN HFA Inhale 2 puffs into the lungs every 4 (four) hours as needed for wheezing or  shortness of breath (cough, shortness of breath or wheezing.).   amLODipine 10 MG tablet Commonly known as:  NORVASC Take 1 tablet (10 mg total) by mouth daily. For high blood pressure What changed:    how much to take  additional instructions   aspirin 81 MG EC tablet Take 2 tablets (162 mg total) by mouth daily. For heart  health   carvedilol 25 MG tablet Commonly known as:  COREG Take 1 tablet (25 mg total) by mouth 2 (two) times daily. KEEP OV.   cholecalciferol 1000 units tablet Commonly known as:  VITAMIN D Take 2 tablets (2,000 Units total) by mouth daily. For bone health   folic acid 1 MG tablet Commonly known as:  FOLVITE Take 1 tablet (1 mg total) by mouth daily. Start taking on:  02/22/2017   lisinopril 10 MG tablet Commonly known as:  PRINIVIL,ZESTRIL TAKE 1 TABLET BY MOUTH DAILY   multivitamin tablet Take 1 tablet by mouth daily. For low vitamin   pravastatin 40 MG tablet Commonly known as:  PRAVACHOL Take 1 tablet (40 mg total) by mouth every evening. For high cholesterol What changed:    how much to take  additional instructions   thiamine 100 MG tablet Take 1 tablet (100 mg total) by mouth daily. Start taking on:  02/22/2017   traZODone 100 MG tablet Commonly known as:  DESYREL Take 1 tablet (100 mg total) by mouth at bedtime as needed for sleep.   Vilazodone HCl 40 MG Tabs Commonly known as:  VIIBRYD Take 1 tablet (40 mg total) by mouth daily with breakfast. For OCD/depression      Allergies  Allergen Reactions  . Gabapentin Swelling  . Statins     Elevated LFTs  (Lipitor specifically)      The results of significant diagnostics from this hospitalization (including imaging, microbiology, ancillary and laboratory) are listed below for reference.    Significant Diagnostic Studies: No results found.  Microbiology: No results found for this or any previous visit (from the past 240 hour(s)).   Labs: Basic Metabolic Panel: Recent Labs  Lab 02/20/17 1750 02/21/17 0627  NA 144 140  K 4.1 3.4*  CL 103 105  CO2 21* 22  GLUCOSE 99 170*  BUN 18 18  CREATININE 0.72 0.71  CALCIUM 8.6* 8.4*   Liver Function Tests: Recent Labs  Lab 02/20/17 1750 02/21/17 0627  AST 48* 38  ALT 38 34  ALKPHOS 69 55  BILITOT 0.7 0.5  PROT 8.3* 6.5  ALBUMIN 4.5 3.6   No  results for input(s): LIPASE, AMYLASE in the last 168 hours. No results for input(s): AMMONIA in the last 168 hours. CBC: Recent Labs  Lab 02/20/17 1750 02/21/17 0627  WBC 10.1 9.6  NEUTROABS 6.3 5.7  HGB 17.7* 14.2  HCT 51.3 42.0  MCV 91.8 90.1  PLT 321 260   Cardiac Enzymes: No results for input(s): CKTOTAL, CKMB, CKMBINDEX, TROPONINI in the last 168 hours. BNP: BNP (last 3 results) No results for input(s): BNP in the last 8760 hours.  ProBNP (last 3 results) No results for input(s): PROBNP in the last 8760 hours.  CBG: Recent Labs  Lab 02/20/17 1746  GLUCAP 103*       Signed:  Chaya Jan  [facility] Pager: [phone] 02/21/2017, 3:14 PM

## 2017-02-22 LAB — HIV ANTIBODY (ROUTINE TESTING W REFLEX): HIV SCREEN 4TH GENERATION: NONREACTIVE

## 2017-05-19 ENCOUNTER — Other Ambulatory Visit: Payer: Self-pay | Admitting: Cardiovascular Disease

## 2017-05-20 NOTE — Telephone Encounter (Signed)
Rx(s) sent to pharmacy electronically.  

## 2017-10-15 ENCOUNTER — Encounter (HOSPITAL_COMMUNITY): Payer: Self-pay

## 2017-10-15 ENCOUNTER — Other Ambulatory Visit: Payer: Self-pay

## 2017-10-15 ENCOUNTER — Emergency Department (HOSPITAL_COMMUNITY): Payer: Self-pay

## 2017-10-15 ENCOUNTER — Inpatient Hospital Stay (HOSPITAL_COMMUNITY)
Admission: EM | Admit: 2017-10-15 | Discharge: 2017-10-21 | DRG: 314 | Disposition: A | Discharge status: Home / Self Care | Payer: Self-pay | Attending: Internal Medicine | Admitting: Internal Medicine

## 2017-10-15 DIAGNOSIS — K7010 Alcoholic hepatitis without ascites: Secondary | ICD-10-CM | POA: Diagnosis present

## 2017-10-15 DIAGNOSIS — F10939 Alcohol use, unspecified with withdrawal, unspecified: Secondary | ICD-10-CM | POA: Diagnosis present

## 2017-10-15 DIAGNOSIS — F429 Obsessive-compulsive disorder, unspecified: Secondary | ICD-10-CM | POA: Diagnosis present

## 2017-10-15 DIAGNOSIS — I429 Cardiomyopathy, unspecified: Secondary | ICD-10-CM

## 2017-10-15 DIAGNOSIS — F419 Anxiety disorder, unspecified: Secondary | ICD-10-CM | POA: Diagnosis present

## 2017-10-15 DIAGNOSIS — E669 Obesity, unspecified: Secondary | ICD-10-CM | POA: Diagnosis present

## 2017-10-15 DIAGNOSIS — R112 Nausea with vomiting, unspecified: Secondary | ICD-10-CM

## 2017-10-15 DIAGNOSIS — E7800 Pure hypercholesterolemia, unspecified: Secondary | ICD-10-CM | POA: Diagnosis present

## 2017-10-15 DIAGNOSIS — Z716 Tobacco abuse counseling: Secondary | ICD-10-CM

## 2017-10-15 DIAGNOSIS — E876 Hypokalemia: Secondary | ICD-10-CM | POA: Diagnosis present

## 2017-10-15 DIAGNOSIS — F209 Schizophrenia, unspecified: Secondary | ICD-10-CM | POA: Diagnosis present

## 2017-10-15 DIAGNOSIS — R509 Fever, unspecified: Secondary | ICD-10-CM

## 2017-10-15 DIAGNOSIS — Z6834 Body mass index (BMI) 34.0-34.9, adult: Secondary | ICD-10-CM

## 2017-10-15 DIAGNOSIS — K219 Gastro-esophageal reflux disease without esophagitis: Secondary | ICD-10-CM | POA: Diagnosis present

## 2017-10-15 DIAGNOSIS — I4892 Unspecified atrial flutter: Secondary | ICD-10-CM | POA: Diagnosis present

## 2017-10-15 DIAGNOSIS — Z79899 Other long term (current) drug therapy: Secondary | ICD-10-CM

## 2017-10-15 DIAGNOSIS — D696 Thrombocytopenia, unspecified: Secondary | ICD-10-CM | POA: Diagnosis present

## 2017-10-15 DIAGNOSIS — I10 Essential (primary) hypertension: Secondary | ICD-10-CM | POA: Diagnosis present

## 2017-10-15 DIAGNOSIS — E871 Hypo-osmolality and hyponatremia: Secondary | ICD-10-CM | POA: Diagnosis present

## 2017-10-15 DIAGNOSIS — K829 Disease of gallbladder, unspecified: Secondary | ICD-10-CM

## 2017-10-15 DIAGNOSIS — Z888 Allergy status to other drugs, medicaments and biological substances status: Secondary | ICD-10-CM

## 2017-10-15 DIAGNOSIS — B9689 Other specified bacterial agents as the cause of diseases classified elsewhere: Secondary | ICD-10-CM | POA: Diagnosis present

## 2017-10-15 DIAGNOSIS — K859 Acute pancreatitis without necrosis or infection, unspecified: Secondary | ICD-10-CM | POA: Diagnosis present

## 2017-10-15 DIAGNOSIS — N179 Acute kidney failure, unspecified: Secondary | ICD-10-CM | POA: Diagnosis present

## 2017-10-15 DIAGNOSIS — K760 Fatty (change of) liver, not elsewhere classified: Secondary | ICD-10-CM | POA: Diagnosis present

## 2017-10-15 DIAGNOSIS — I5023 Acute on chronic systolic (congestive) heart failure: Secondary | ICD-10-CM | POA: Diagnosis not present

## 2017-10-15 DIAGNOSIS — X088XXA Exposure to other specified smoke, fire and flames, initial encounter: Secondary | ICD-10-CM | POA: Diagnosis present

## 2017-10-15 DIAGNOSIS — R296 Repeated falls: Secondary | ICD-10-CM | POA: Diagnosis present

## 2017-10-15 DIAGNOSIS — F1010 Alcohol abuse, uncomplicated: Secondary | ICD-10-CM

## 2017-10-15 DIAGNOSIS — Z7982 Long term (current) use of aspirin: Secondary | ICD-10-CM

## 2017-10-15 DIAGNOSIS — Z720 Tobacco use: Secondary | ICD-10-CM | POA: Diagnosis present

## 2017-10-15 DIAGNOSIS — F4310 Post-traumatic stress disorder, unspecified: Secondary | ICD-10-CM | POA: Diagnosis present

## 2017-10-15 DIAGNOSIS — F17210 Nicotine dependence, cigarettes, uncomplicated: Secondary | ICD-10-CM | POA: Diagnosis present

## 2017-10-15 DIAGNOSIS — E860 Dehydration: Secondary | ICD-10-CM | POA: Diagnosis present

## 2017-10-15 DIAGNOSIS — R7881 Bacteremia: Secondary | ICD-10-CM | POA: Diagnosis present

## 2017-10-15 DIAGNOSIS — I426 Alcoholic cardiomyopathy: Principal | ICD-10-CM | POA: Diagnosis present

## 2017-10-15 DIAGNOSIS — I110 Hypertensive heart disease with heart failure: Secondary | ICD-10-CM | POA: Diagnosis present

## 2017-10-15 DIAGNOSIS — I5021 Acute systolic (congestive) heart failure: Secondary | ICD-10-CM

## 2017-10-15 DIAGNOSIS — T23021A Burn of unspecified degree of single right finger (nail) except thumb, initial encounter: Secondary | ICD-10-CM | POA: Diagnosis present

## 2017-10-15 DIAGNOSIS — F10239 Alcohol dependence with withdrawal, unspecified: Secondary | ICD-10-CM | POA: Diagnosis present

## 2017-10-15 DIAGNOSIS — F332 Major depressive disorder, recurrent severe without psychotic features: Secondary | ICD-10-CM | POA: Diagnosis present

## 2017-10-15 DIAGNOSIS — F10231 Alcohol dependence with withdrawal delirium: Secondary | ICD-10-CM | POA: Diagnosis present

## 2017-10-15 DIAGNOSIS — G4733 Obstructive sleep apnea (adult) (pediatric): Secondary | ICD-10-CM | POA: Diagnosis present

## 2017-10-15 HISTORY — DX: Alcohol dependence, uncomplicated: F10.20

## 2017-10-15 LAB — CBC WITH DIFFERENTIAL/PLATELET
Basophils Absolute: 0 10*3/uL (ref 0.0–0.1)
Basophils Relative: 0 %
Eosinophils Absolute: 0 10*3/uL (ref 0.0–0.7)
Eosinophils Relative: 0 %
HEMATOCRIT: 40.4 % (ref 39.0–52.0)
Hemoglobin: 14.4 g/dL (ref 13.0–17.0)
LYMPHS ABS: 0.3 10*3/uL — AB (ref 0.7–4.0)
Lymphocytes Relative: 5 %
MCH: 32.2 pg (ref 26.0–34.0)
MCHC: 35.6 g/dL (ref 30.0–36.0)
MCV: 90.4 fL (ref 78.0–100.0)
MONO ABS: 1.1 10*3/uL — AB (ref 0.1–1.0)
MONOS PCT: 16 %
Neutro Abs: 5.6 10*3/uL (ref 1.7–7.7)
Neutrophils Relative %: 79 %
Platelets: 50 10*3/uL — ABNORMAL LOW (ref 150–400)
RBC: 4.47 MIL/uL (ref 4.22–5.81)
RDW: 14.4 % (ref 11.5–15.5)
WBC: 7 10*3/uL (ref 4.0–10.5)

## 2017-10-15 LAB — MAGNESIUM: Magnesium: 2.2 mg/dL (ref 1.7–2.4)

## 2017-10-15 LAB — COMPREHENSIVE METABOLIC PANEL
ALBUMIN: 3.8 g/dL (ref 3.5–5.0)
ALT: 108 U/L — ABNORMAL HIGH (ref 0–44)
AST: 186 U/L — AB (ref 15–41)
Alkaline Phosphatase: 114 U/L (ref 38–126)
Anion gap: >20 — ABNORMAL HIGH (ref 5–15)
BILIRUBIN TOTAL: 5.1 mg/dL — AB (ref 0.3–1.2)
BUN: 58 mg/dL — ABNORMAL HIGH (ref 6–20)
CHLORIDE: 78 mmol/L — AB (ref 98–111)
CO2: 23 mmol/L (ref 22–32)
Calcium: 9.3 mg/dL (ref 8.9–10.3)
Creatinine, Ser: 1.83 mg/dL — ABNORMAL HIGH (ref 0.61–1.24)
GFR calc Af Amer: 51 mL/min — ABNORMAL LOW (ref >60)
GFR calc non Af Amer: 44 mL/min — ABNORMAL LOW (ref >60)
GLUCOSE: 141 mg/dL — AB (ref 70–99)
POTASSIUM: 3.3 mmol/L — AB (ref 3.5–5.1)
SODIUM: 128 mmol/L — AB (ref 135–145)
Total Protein: 7.9 g/dL (ref 6.5–8.1)

## 2017-10-15 LAB — LIPASE, BLOOD: Lipase: 205 U/L — ABNORMAL HIGH (ref 11–51)

## 2017-10-15 LAB — PROTIME-INR
INR: 1.06
Prothrombin Time: 13.7 seconds (ref 11.4–15.2)

## 2017-10-15 LAB — URINALYSIS, ROUTINE W REFLEX MICROSCOPIC
Glucose, UA: NEGATIVE mg/dL
KETONES UR: 20 mg/dL — AB
LEUKOCYTES UA: NEGATIVE
NITRITE: NEGATIVE
PH: 5 (ref 5.0–8.0)
Protein, ur: 100 mg/dL — AB
Specific Gravity, Urine: 1.028 (ref 1.005–1.030)

## 2017-10-15 LAB — RAPID URINE DRUG SCREEN, HOSP PERFORMED
Amphetamines: NOT DETECTED
BENZODIAZEPINES: NOT DETECTED
Barbiturates: NOT DETECTED
COCAINE: NOT DETECTED
Opiates: NOT DETECTED
TETRAHYDROCANNABINOL: NOT DETECTED

## 2017-10-15 LAB — ETHANOL

## 2017-10-15 LAB — I-STAT CG4 LACTIC ACID, ED
LACTIC ACID, VENOUS: 1.89 mmol/L (ref 0.5–1.9)
Lactic Acid, Venous: 0.93 mmol/L (ref 0.5–1.9)

## 2017-10-15 LAB — CBG MONITORING, ED: GLUCOSE-CAPILLARY: 138 mg/dL — AB (ref 70–99)

## 2017-10-15 LAB — MRSA PCR SCREENING: MRSA BY PCR: NEGATIVE

## 2017-10-15 MED ORDER — SODIUM CHLORIDE 0.9 % IV SOLN
Freq: Once | INTRAVENOUS | Status: AC
  Administered 2017-10-15: 17:00:00 via INTRAVENOUS

## 2017-10-15 MED ORDER — LORAZEPAM 2 MG/ML IJ SOLN
2.0000 mg | Freq: Once | INTRAMUSCULAR | Status: AC
Start: 2017-10-15 — End: 2017-10-15
  Administered 2017-10-15: 2 mg via INTRAVENOUS
  Filled 2017-10-15: qty 1

## 2017-10-15 MED ORDER — ONDANSETRON HCL 4 MG/2ML IJ SOLN
4.0000 mg | Freq: Once | INTRAMUSCULAR | Status: AC
  Administered 2017-10-15: 4 mg via INTRAVENOUS
  Filled 2017-10-15: qty 2

## 2017-10-15 MED ORDER — SODIUM CHLORIDE 0.9 % IV SOLN
2.0000 g | Freq: Once | INTRAVENOUS | Status: AC
  Administered 2017-10-15: 2 g via INTRAVENOUS
  Filled 2017-10-15: qty 2

## 2017-10-15 MED ORDER — LORAZEPAM 2 MG/ML IJ SOLN
0.0000 mg | Freq: Four times a day (QID) | INTRAMUSCULAR | Status: DC
  Administered 2017-10-16: 1 mg via INTRAVENOUS
  Filled 2017-10-15: qty 1

## 2017-10-15 MED ORDER — ACETAMINOPHEN 650 MG RE SUPP: 650.0000 mg | Freq: Four times a day (QID) | RECTAL | Status: DC | PRN

## 2017-10-15 MED ORDER — THIAMINE HCL 100 MG/ML IJ SOLN
100.0000 mg | Freq: Once | INTRAMUSCULAR | Status: AC
  Administered 2017-10-15: 100 mg via INTRAVENOUS
  Filled 2017-10-15: qty 2

## 2017-10-15 MED ORDER — VANCOMYCIN HCL IN DEXTROSE 1-5 GM/200ML-% IV SOLN: 1000.0000 mg | Freq: Once | INTRAVENOUS | Status: DC

## 2017-10-15 MED ORDER — FOLIC ACID 1 MG PO TABS
1.0000 mg | ORAL_TABLET | Freq: Every day | ORAL | Status: DC
  Administered 2017-10-16 – 2017-10-21 (×6): 1 mg via ORAL
  Filled 2017-10-15 (×6): qty 1

## 2017-10-15 MED ORDER — ACETAMINOPHEN 325 MG PO TABS: 650.0000 mg | ORAL_TABLET | Freq: Four times a day (QID) | ORAL | Status: DC | PRN

## 2017-10-15 MED ORDER — VANCOMYCIN HCL IN DEXTROSE 1-5 GM/200ML-% IV SOLN
1000.0000 mg | Freq: Once | INTRAVENOUS | Status: AC
  Administered 2017-10-15: 1000 mg via INTRAVENOUS
  Filled 2017-10-15: qty 200

## 2017-10-15 MED ORDER — LORAZEPAM 2 MG/ML IJ SOLN: 0.0000 mg | Freq: Two times a day (BID) | INTRAMUSCULAR | Status: DC

## 2017-10-15 MED ORDER — METRONIDAZOLE IN NACL 5-0.79 MG/ML-% IV SOLN
500.0000 mg | Freq: Three times a day (TID) | INTRAVENOUS | Status: DC
  Administered 2017-10-15: 500 mg via INTRAVENOUS
  Filled 2017-10-15: qty 100

## 2017-10-15 MED ORDER — KCL IN DEXTROSE-NACL 20-5-0.9 MEQ/L-%-% IV SOLN
INTRAVENOUS | Status: DC
  Administered 2017-10-15 – 2017-10-16 (×2): via INTRAVENOUS

## 2017-10-15 MED ORDER — SODIUM CHLORIDE 0.9 % IV BOLUS
1000.0000 mL | Freq: Once | INTRAVENOUS | Status: AC
  Administered 2017-10-15: 1000 mL via INTRAVENOUS

## 2017-10-15 MED ORDER — ADULT MULTIVITAMIN W/MINERALS CH
1.0000 | ORAL_TABLET | Freq: Every day | ORAL | Status: DC
  Administered 2017-10-16 – 2017-10-21 (×6): 1 via ORAL
  Filled 2017-10-15 (×6): qty 1

## 2017-10-15 MED ORDER — LORAZEPAM 1 MG PO TABS: 1.0000 mg | ORAL_TABLET | Freq: Four times a day (QID) | ORAL | Status: AC | PRN

## 2017-10-15 MED ORDER — SODIUM CHLORIDE 0.9 % IV SOLN
2.0000 g | Freq: Three times a day (TID) | INTRAVENOUS | Status: DC
  Administered 2017-10-16 – 2017-10-17 (×5): 2 g via INTRAVENOUS
  Filled 2017-10-15 (×10): qty 2

## 2017-10-15 MED ORDER — VANCOMYCIN HCL IN DEXTROSE 1-5 GM/200ML-% IV SOLN
1000.0000 mg | Freq: Two times a day (BID) | INTRAVENOUS | Status: DC
  Administered 2017-10-16: 1000 mg via INTRAVENOUS
  Filled 2017-10-15: qty 200

## 2017-10-15 MED ORDER — IPRATROPIUM-ALBUTEROL 0.5-2.5 (3) MG/3ML IN SOLN: 3.0000 mL | Freq: Four times a day (QID) | RESPIRATORY_TRACT | Status: DC | PRN

## 2017-10-15 MED ORDER — LORAZEPAM 2 MG/ML IJ SOLN
1.0000 mg | Freq: Four times a day (QID) | INTRAMUSCULAR | Status: AC | PRN
  Administered 2017-10-18: 1 mg via INTRAVENOUS

## 2017-10-15 MED ORDER — ONDANSETRON HCL 4 MG PO TABS
4.0000 mg | ORAL_TABLET | Freq: Four times a day (QID) | ORAL | Status: DC | PRN
  Administered 2017-10-17: 4 mg via ORAL
  Filled 2017-10-15: qty 1

## 2017-10-15 MED ORDER — THIAMINE HCL 100 MG/ML IJ SOLN: 100.0000 mg | Freq: Every day | INTRAMUSCULAR | Status: DC

## 2017-10-15 MED ORDER — LABETALOL HCL 5 MG/ML IV SOLN: 10.0000 mg | INTRAVENOUS | Status: DC | PRN

## 2017-10-15 MED ORDER — VITAMIN B-1 100 MG PO TABS
100.0000 mg | ORAL_TABLET | Freq: Every day | ORAL | Status: DC
  Administered 2017-10-16 – 2017-10-21 (×6): 100 mg via ORAL
  Filled 2017-10-15 (×6): qty 1

## 2017-10-15 MED ORDER — ONDANSETRON HCL 4 MG/2ML IJ SOLN
4.0000 mg | Freq: Four times a day (QID) | INTRAMUSCULAR | Status: DC | PRN
  Administered 2017-10-18 – 2017-10-20 (×4): 4 mg via INTRAVENOUS
  Filled 2017-10-15 (×4): qty 2

## 2017-10-15 NOTE — ED Triage Notes (Addendum)
Pt brought in by EMS . Pt reports that he has been vomiting for 12 hours and hadnt had anything to drink in 24 hrs. Pt has been on a 2 week drinking binge. EMS reports he had large amount of empt beer cans and liquor bottles. Pt reports he has not ate in over week. Pt has blisters on fingers from falling asleep smoking and bruising from falling. Pt reports he stopped drinking because he started throwing up

## 2017-10-15 NOTE — H&P (Addendum)
History and Physical    Jahlen Bollman ZOX:[id number] DOB: October 10, 1975 DOA: 10/15/2017  PCP: Patient, No Pcp Per   Patient coming from: Home  Chief Complaint: Vomiting  HPI: Azell Bill is a 42 y.o. male with medical history significant for alcohol and tobacco abuse, depression, OSA, paroxysmal atrial flutter.  Patient presented to the ED with reports of multiple episodes of nonbloody vomiting.  Emesis sometimes dark, sometimes clear.  Patient has been binge drinking over the past 2 weeks, drinks at least half a gallon of vodka daily.  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  Last drink was 24 hours ago, he stopped drinking when he started vomiting.  Patient reports he has barely eaten in 1 week. He denies abdominal pain or distention, no loose stools.  Patient denies actual coughing after vomiting, does not think he aspirated, both admits to several episodes of loss of consciousness/passing out while binge drinking over the past 2 weeks.  He denies dysuria or frequency.  No neck pain, or facial pain no headaches.  History of alcohol withdrawal, without seizures.   ED Course: Febrile 101.7, tachycardic to 132, tachypneic to 36.  WBC 7.  Lactic acid 1.89.  Alcohol level less than 10, lipase mildly elevated 205. Normal magnesium- 2.2.  Sodium low 128, potassium low 3.3.  UA-moderate hemoglobin, rare bacteria.  EKG -sinus tachycardia repolarization abnormalities. Head CT -mild paranasal sinus disease (incidental finding as he has no symptoms) two-view chest x-ray-negative for acute abnormality. Patient was given 2 L bolus normal saline in ED. Started on IV Vanco, cefepime, metronidazole, thiamine, IV Ativan.   Review of Systems: As per HPI all other systems reviewed and negative  Past Medical History:  Diagnosis Date  . Alcoholic (HCC)   . Anxiety   . Cardiomyopathy (HCC)   . Colitis   . Depression   . High cholesterol   .  Hypertension   . OCD (obsessive compulsive disorder)   . OSA (obstructive sleep apnea) 01/22/2013  . PTSD (post-traumatic stress disorder)     Past Surgical History:  Procedure Laterality Date  . CARDIOVERSION N/A 09/15/2012   Procedure: CARDIOVERSION;  Surgeon: Chrystie Nose, MD;  Location: [facility] OR;  Service: Cardiovascular;  Laterality: N/A;     reports that he has been smoking cigarettes. He has a 20.00 pack-year smoking history. He has never used smokeless tobacco. He reports that he drinks about 12.0 standard drinks of alcohol per week. He reports that he does not use drugs.  Allergies  Allergen Reactions  . Gabapentin Swelling  . Statins     Elevated LFTs  (Lipitor specifically)    Family History  Problem Relation Age of Onset  . Healthy Mother   . Healthy Father   . Sudden Cardiac Death Neg Hx     Prior to Admission medications   Medication Sig Start Date End Date Taking? Authorizing Provider  albuterol (PROVENTIL HFA;VENTOLIN HFA) 108 (90 Base) MCG/ACT inhaler Inhale 2 puffs into the lungs every 4 (four) hours as needed for wheezing or shortness of breath (cough, shortness of breath or wheezing.). 10/25/15  Yes Elvina Sidle, MD  aspirin EC 81 MG EC tablet Take 2 tablets (162 mg total) by mouth daily. For heart health 09/01/15  Yes Armandina Stammer I, NP  carvedilol (COREG) 25 MG tablet Take 1 tablet (25 mg total) by mouth 2 (two) times daily. 05/20/17  Yes Croitoru, Rachelle Hora, MD  lisinopril (PRINIVIL,ZESTRIL)  10 MG tablet TAKE 1 TABLET BY MOUTH DAILY Patient taking differently: Take 10 mg by mouth daily.  12/17/16  Yes Croitoru, Mihai, MD  thiamine 100 MG tablet Take 1 tablet (100 mg total) by mouth daily. 02/22/17  Yes Philip Aspen, Limmie Patricia, MD  Vilazodone HCl (VIIBRYD) 40 MG TABS Take 1 tablet (40 mg total) by mouth daily with breakfast. For OCD/depression 09/01/15  Yes Sanjuana Kava, NP    Physical Exam: Vitals:   10/15/17 1611 10/15/17 1710 10/15/17 1815 10/15/17 1823   BP:  (!) 128/91  (!) 142/89  Pulse:  (!) 124 (!) 120 (!) 113  Resp:  (!) 28 (!) 34 16  Temp: (!) 101.1 F (38.4 C)  (!) 101.7 F (38.7 C) 99.1 F (37.3 C)  TempSrc: Rectal  Rectal Oral  SpO2:  95%  98%  Weight:      Height: 5\' 9"  (1.753 m)       Constitutional: Mildly tremulous Vitals:   10/15/17 1611 10/15/17 1710 10/15/17 1815 10/15/17 1823  BP:  (!) 128/91  (!) 142/89  Pulse:  (!) 124 (!) 120 (!) 113  Resp:  (!) 28 (!) 34 16  Temp: (!) 101.1 F (38.4 C)  (!) 101.7 F (38.7 C) 99.1 F (37.3 C)  TempSrc: Rectal  Rectal Oral  SpO2:  95%  98%  Weight:      Height: 5\' 9"  (1.753 m)      Eyes: PERRL, lids and conjunctivae normal ENMT: Mucous membranes are dry. Posterior pharynx clear of any exudate or lesions. Neck: normal, supple, no masses, no thyromegaly Respiratory: Diffuse expiratory rhonchi, normal respiratory effort. No accessory muscle use.  Cardiovascular: Tachycardic but regular rate and rhythm, no murmurs / rubs / gallops. No extremity edema. 2+ pedal pulses.  Abdomen: no tenderness, no masses palpated. No hepatosplenomegaly. Bowel sounds positive.  Musculoskeletal: no clubbing / cyanosis. No joint deformity upper and lower extremities. Good ROM, no contractures. Normal muscle tone.  Skin: Diffuse skin bruising patient reports from falls , no ulcers. No induration Neurologic: CN 2-12 grossly intact. Sensation intact, DTR normal. Strength 5/5 in all 4.  Psychiatric: Flat Affect, alert and oriented x3   Labs on Admission: I have personally reviewed following labs and imaging studies  CBC: Recent Labs  Lab 10/15/17 1459  WBC 7.0  NEUTROABS 5.6  HGB 14.4  HCT 40.4  MCV 90.4  PLT 50*   Basic Metabolic Panel: Recent Labs  Lab 10/15/17 1459  NA 128*  K 3.3*  CL 78*  CO2 23  GLUCOSE 141*  BUN 58*  CREATININE 1.83*  CALCIUM 9.3  MG 2.2   Liver Function Tests: Recent Labs  Lab 10/15/17 1459  AST 186*  ALT 108*  ALKPHOS 114  BILITOT 5.1*  PROT  7.9  ALBUMIN 3.8   Recent Labs  Lab 10/15/17 1459  LIPASE 205*   Coagulation Profile: Recent Labs  Lab 10/15/17 1459  INR 1.06   CBG: Recent Labs  Lab 10/15/17 1446  GLUCAP 138*   Urine analysis:    Component Value Date/Time   COLORURINE AMBER (A) 10/15/2017 1448   APPEARANCEUR CLOUDY (A) 10/15/2017 1448   LABSPEC 1.028 10/15/2017 1448   PHURINE 5.0 10/15/2017 1448   GLUCOSEU NEGATIVE 10/15/2017 1448   HGBUR MODERATE (A) 10/15/2017 1448   BILIRUBINUR MODERATE (A) 10/15/2017 1448   KETONESUR 20 (A) 10/15/2017 1448   PROTEINUR 100 (A) 10/15/2017 1448   UROBILINOGEN 0.2 10/14/2008 1919   NITRITE NEGATIVE 10/15/2017 1448  LEUKOCYTESUR NEGATIVE 10/15/2017 1448    Radiological Exams on Admission: Dg Chest 2 View  Result Date: 10/15/2017 CLINICAL DATA:  Vomiting.  Alcohol abuse. EXAM: CHEST - 2 VIEW COMPARISON:  09/12/2012 FINDINGS: The cardiac silhouette remains borderline enlarged. The posterior costophrenic angles were excluded on the lateral radiograph. No airspace consolidation, edema, sizable pleural effusion, or pneumothorax is identified. No acute osseous abnormality is seen. IMPRESSION: No active cardiopulmonary disease. Electronically Signed   By: Sebastian Ache M.D.   On: 10/15/2017 17:06   Ct Head Wo Contrast  Result Date: 10/15/2017 CLINICAL DATA:  Altered mental status with vomiting. EXAM: CT HEAD WITHOUT CONTRAST TECHNIQUE: Contiguous axial images were obtained from the base of the skull through the vertex without intravenous contrast. COMPARISON:  August 20, 2015 FINDINGS: Brain: The ventricles are normal in size and configuration. There is no intracranial mass, hemorrhage, extra-axial fluid collection, or midline shift. Gray-white compartments appear normal. No evident acute infarct. Vascular: No hyperdense vessel. There is no appreciable vascular calcification. Skull: Bony calvarium appears intact. SinusesOrbits: Ethmoid air cell complex. There is mucosal  thickening involving several ethmoid air cells as well more anteriorly. Other visualized paranasal sinuses are clear. Frontal sinuses are hypoplastic. Visualized orbits appear symmetric bilaterally. Other: Mastoid air cells are clear. IMPRESSION: Mild paranasal sinus disease.  Study otherwise unremarkable. Electronically Signed   By: Bretta Bang III M.D.   On: 10/15/2017 16:52    EKG: Independently reviewed.  QTc 442.  Sinus tachycardia.  Repolarization abnormalities EKG unchanged from prior.   Assessment/Plan Active Problems:   Paroxysmal atrial flutter (HCC)   Cardiomyopathy- etiol uindetermined- EF reportedly 35-40%   HTN (hypertension)   OSA (obstructive sleep apnea)   MDD (major depressive disorder), recurrent severe, without psychosis (HCC)   Alcohol withdrawal (HCC)    Alcohol withdrawal-likely delirium tremens with tachycardia, tachypnea, hyperthermia.  Denies hallucinosis at this time.  Last drink 24 to 36 hours ago.  Drinks half gallon vodka daily.  No seizure history.  Electrolytes- K, MAg normal -Ativan PRN and scheduled -Hydrate D5 normal saline +20 KCl 100 cc/h x 1 day -BMP CBC a.m. -Thiamine folate multivitamins - NPO for now  Fever-temp 101.5.  At this time no focus of infection.  But reports vomiting.  No cough or difficulty breathing, on room air.  Likely hypoxemia related to alcohol withdrawal DTs.  Two-view chest x-ray UA negative for infectious if etiology.  Neck supple. -Continue IV Vanco and cefepime at this time -We will discontinue metronidazole -Follow-up blood culture results drawn in ED -Pending clinical course may need repeat chest x-ray to rule out aspiration if fever is still a concern.  Hyponatremia-sodium 128.  Likely from poor p.o. Intake, X 1 week,  vomiting x 1 day. Possibly also beer portomania. -Hydrate with saline containing fluids x1 day -BMP a.m.  Acute kidney injury-creatinine 1.8 baseline 0.7-1.1.  Also with elevated BUN 58.  Likely  prerenal, from dehydration and vomiting. -Hydrate -BMP a.m.  Elevated lipase- 205, likely from vomiting.  He has no abdominal pain.  So doubt pancreatitis at this time -N.p.o. for now  Depression-patient with flat affect.  -Psych consult  Thrombocytopenia- platelets 50. Baseline > than 260. -CBC a.m. -Avoiding pharmacologic DVT prophylaxis  HTN-systolic 117-1 60s. -Hold home Coreg -PRN labetalol IV while n.p.o.  DVT prophylaxis: Scds Code Status: Full Family Communication: None at bedside Disposition Plan: Per rounding team Consults called: Psych Admission status: Inpt, step down   Onnie Boer MD [facility] Pager [phone] From 6PM-2AM.  Otherwise please contact night-coverage [email or web address] Password [facility]  10/15/2017, 6:41 PM

## 2017-10-15 NOTE — ED Notes (Signed)
Clothing covered in dirt. Urine saturated underwear Pt is covered in dirt from head to toe. Pt has large purple bruising to bilateral upper and lower ext , shoulders as well as abdomen. Blisters to figure tips. Moisture associated injury inner buttocks.

## 2017-10-15 NOTE — Progress Notes (Signed)
Pharmacy Antibiotic Note  Malik Edwards is a 42 y.o. male admitted on 10/15/2017 with infection-unknown source.  Pharmacy has been consulted for Vancomycin and Cefepime dosing.  Plan: Vancomycin 2000 mg IV x 1 dose Vancomycin 1000 mg IV every 12 hours.  Goal trough 15-20 mcg/mL.  Cefepime 2000 mg IV every 8 hours Metronidazole 500 mg IV every 8 hours Monitor labs, c/s, and vanco trough as indicated  Height: 5\' 9"  (175.3 cm) Weight: 262 lb 5.6 oz (119 kg) IBW/kg (Calculated) : 70.7  Temp (24hrs), Avg:100.4 F (38 C), Min:98.9 F (37.2 C), Max:101.1 F (38.4 C)  Recent Labs  Lab 10/15/17 1459  WBC 7.0  CREATININE 1.83*    Estimated Creatinine Clearance: 66.3 mL/min (A) (by C-G formula based on SCr of 1.83 mg/dL (H)).    Allergies  Allergen Reactions  . Gabapentin Swelling  . Statins     Elevated LFTs  (Lipitor specifically)    Antimicrobials this admission: Vanco 9/17 >>  Cefepime 9/17 >>  Flagyl 9/17 >>  Dose adjustments this admission: N/A  Microbiology results: 9/17 BCx: pending   Thank you for allowing pharmacy to be a part of this patient's care.  Tad Moore 10/15/2017 4:26 PM

## 2017-10-15 NOTE — ED Notes (Signed)
Spoke to [facility] in [place] to file a report at this time.

## 2017-10-15 NOTE — ED Notes (Signed)
Pt remains in CT at this time.  
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
## 2017-10-15 NOTE — ED Notes (Signed)
MD notified of rectal temp 101.7 at this time, no antipyretics at this time.

## 2017-10-16 LAB — BLOOD CULTURE ID PANEL (REFLEXED)
Acinetobacter baumannii: NOT DETECTED
CANDIDA KRUSEI: NOT DETECTED
Candida albicans: NOT DETECTED
Candida glabrata: NOT DETECTED
Candida parapsilosis: NOT DETECTED
Candida tropicalis: NOT DETECTED
Enterobacter cloacae complex: NOT DETECTED
Enterobacteriaceae species: NOT DETECTED
Enterococcus species: NOT DETECTED
Escherichia coli: NOT DETECTED
HAEMOPHILUS INFLUENZAE: NOT DETECTED
Klebsiella oxytoca: NOT DETECTED
Klebsiella pneumoniae: NOT DETECTED
LISTERIA MONOCYTOGENES: NOT DETECTED
Neisseria meningitidis: NOT DETECTED
PROTEUS SPECIES: NOT DETECTED
PSEUDOMONAS AERUGINOSA: NOT DETECTED
SERRATIA MARCESCENS: NOT DETECTED
STAPHYLOCOCCUS AUREUS BCID: NOT DETECTED
STAPHYLOCOCCUS SPECIES: NOT DETECTED
STREPTOCOCCUS PYOGENES: NOT DETECTED
STREPTOCOCCUS SPECIES: NOT DETECTED
Streptococcus agalactiae: NOT DETECTED
Streptococcus pneumoniae: NOT DETECTED

## 2017-10-16 LAB — CBC
HEMATOCRIT: 35.9 % — AB (ref 39.0–52.0)
Hemoglobin: 12.3 g/dL — ABNORMAL LOW (ref 13.0–17.0)
MCH: 32 pg (ref 26.0–34.0)
MCHC: 34.3 g/dL (ref 30.0–36.0)
MCV: 93.5 fL (ref 78.0–100.0)
Platelets: 55 10*3/uL — ABNORMAL LOW (ref 150–400)
RBC: 3.84 MIL/uL — ABNORMAL LOW (ref 4.22–5.81)
RDW: 14.7 % (ref 11.5–15.5)
WBC: 4.6 10*3/uL (ref 4.0–10.5)

## 2017-10-16 LAB — URINE CULTURE: Culture: NO GROWTH

## 2017-10-16 LAB — BASIC METABOLIC PANEL
Anion gap: 15 (ref 5–15)
BUN: 44 mg/dL — AB (ref 6–20)
CHLORIDE: 91 mmol/L — AB (ref 98–111)
CO2: 28 mmol/L (ref 22–32)
CREATININE: 0.94 mg/dL (ref 0.61–1.24)
Calcium: 8.8 mg/dL — ABNORMAL LOW (ref 8.9–10.3)
GFR calc Af Amer: >60 mL/min (ref >60)
GLUCOSE: 119 mg/dL — AB (ref 70–99)
POTASSIUM: 3 mmol/L — AB (ref 3.5–5.1)
Sodium: 134 mmol/L — ABNORMAL LOW (ref 135–145)

## 2017-10-16 MED ORDER — POTASSIUM CHLORIDE CRYS ER 20 MEQ PO TBCR
40.0000 meq | EXTENDED_RELEASE_TABLET | Freq: Once | ORAL | Status: AC
  Administered 2017-10-16: 40 meq via ORAL
  Filled 2017-10-16: qty 2

## 2017-10-16 MED ORDER — LORAZEPAM 2 MG/ML IJ SOLN
2.0000 mg | Freq: Once | INTRAMUSCULAR | Status: AC
  Administered 2017-10-16: 2 mg via INTRAVENOUS
  Filled 2017-10-16: qty 1

## 2017-10-16 MED ORDER — PANTOPRAZOLE SODIUM 40 MG PO TBEC
40.0000 mg | DELAYED_RELEASE_TABLET | Freq: Two times a day (BID) | ORAL | Status: DC
  Administered 2017-10-16 – 2017-10-21 (×10): 40 mg via ORAL
  Filled 2017-10-16 (×10): qty 1

## 2017-10-16 MED ORDER — HYDROXYZINE HCL 25 MG PO TABS
50.0000 mg | ORAL_TABLET | Freq: Once | ORAL | Status: AC
  Administered 2017-10-16: 50 mg via ORAL
  Filled 2017-10-16: qty 2

## 2017-10-16 MED ORDER — CARVEDILOL 12.5 MG PO TABS
25.0000 mg | ORAL_TABLET | Freq: Two times a day (BID) | ORAL | Status: DC
  Administered 2017-10-16 – 2017-10-21 (×12): 25 mg via ORAL
  Filled 2017-10-16 (×12): qty 2
  Filled 2017-10-16: qty 8
  Filled 2017-10-16: qty 2

## 2017-10-16 MED ORDER — LORAZEPAM 2 MG/ML IJ SOLN: 0.0000 mg | Freq: Two times a day (BID) | INTRAMUSCULAR | Status: DC

## 2017-10-16 MED ORDER — NICOTINE 21 MG/24HR TD PT24
21.0000 mg | MEDICATED_PATCH | Freq: Every day | TRANSDERMAL | Status: DC
  Administered 2017-10-16 – 2017-10-21 (×6): 21 mg via TRANSDERMAL
  Filled 2017-10-16 (×6): qty 1

## 2017-10-16 MED ORDER — LORAZEPAM 2 MG/ML IJ SOLN: 1.0000 mg | Freq: Once | INTRAMUSCULAR | Status: DC

## 2017-10-16 MED ORDER — LORAZEPAM 2 MG/ML IJ SOLN
0.0000 mg | INTRAMUSCULAR | Status: AC | PRN
  Administered 2017-10-19: 2 mg via INTRAVENOUS
  Administered 2017-10-19: 1 mg via INTRAVENOUS
  Administered 2017-10-20: 2 mg via INTRAVENOUS
  Filled 2017-10-16 (×4): qty 1

## 2017-10-16 MED ORDER — SODIUM CHLORIDE 0.9 % IV SOLN
INTRAVENOUS | Status: DC
  Administered 2017-10-16 – 2017-10-19 (×8): via INTRAVENOUS

## 2017-10-16 MED ORDER — LORAZEPAM 2 MG/ML IJ SOLN: 0.0000 mg | INTRAMUSCULAR | Status: DC | PRN

## 2017-10-16 MED ORDER — DIAZEPAM 2 MG PO TABS
2.0000 mg | ORAL_TABLET | Freq: Three times a day (TID) | ORAL | Status: AC
  Administered 2017-10-16 – 2017-10-17 (×6): 2 mg via ORAL
  Filled 2017-10-16 (×6): qty 1

## 2017-10-16 MED ORDER — LORAZEPAM 2 MG/ML IJ SOLN: 0.0000 mg | Freq: Four times a day (QID) | INTRAMUSCULAR | Status: DC

## 2017-10-16 NOTE — Progress Notes (Signed)
CRITICAL VALUE ALERT  Critical Value:  Blood culture gram + cocci in anaerobic bottle  Date & Time Notied:  9/18 1932  Provider Notified: Veronia Beets

## 2017-10-16 NOTE — Progress Notes (Signed)
Patient Demographics:    Malik Edwards, is a 42 y.o. male, DOB - [birth year], WUJ:[id number]  Admit date - 10/15/2017   Admitting Physician Ejiroghene Wendall Stade, MD  Outpatient Primary MD for the patient is Patient, No Pcp Per  LOS - 1   Chief Complaint  Patient presents with  . Emesis        Subjective:    Malik Edwards today has no fevers, no emesis,  No chest pain, tachycardia persist, no further fevers, tremors and DT type symptoms noted,  Assessment  & Plan :    Principal Problem:   Alcohol withdrawal (HCC) Active Problems:   Paroxysmal atrial flutter (HCC)   Cardiomyopathy- etiol uindetermined- EF reportedly 35-40%   HTN (hypertension)   OSA (obstructive sleep apnea)   MDD (major depressive disorder), recurrent severe, without psychosis (HCC)  Brief Summary 42 y.o. male with medical history significant for alcohol and tobacco abuse, depression, OSA, paroxysmal atrial flutter admitted on 10/15/2017 with persistent emesis elevated lipase and concerns about alcohol withdrawal.   Plan:- 1)Alcohol Withdrawal/DTs----last alcoholic intake 10/14/2017, binge drinking heavily for the last couple of weeks prior to admission, heart rate at rest 110s to 120s, tremors, anxious,  start p.o. Valium 2 mg 3 times daily scheduled for the next couple of days, continue IV Lorazepam per CIWA protocol, continue multivitamin/folic acid/thiamine,, continue IV fluids  2)AKI----acute kidney injury , prerenal due to dehydration in the setting of persistent emesis and decreased oral intake,   creatinine on admission= 1.83, baseline creatinine = 0.73, creatinine is now= 0.94, renally adjust medications, avoid nephrotoxic agents/dehydration/hypotension, continue to hold lisinopril  3)Acute Pancreatitis--- persistent emesis and elevated lipase in an alcoholic male, give Protonix, no further emesis, denies significant abdominal  pain, lipase was 205 on admission, give liquid diet and advance as tolerated  4)H/o PAFlutter--- appears stable at this time, patient appears to be in sinus rhythm at this time, restart Coreg 25 mg bid , probably not a good candidate for anticoagulation given significant alcohol abuse and thrombocytopenia  5)Thrombocytopenia and Elevated LFTs--- most likely due to alcohol abuse, AST to ALT ratio is almost 2:1, anticipate improvement in platelet count with cessation of alcohol use, no bleeding concerns, platelet count is up to 55K  6)Fevers--- ???  If he aspirated, chest x-ray on admission without acute findings, stop vancomycin, may DC cefepime if blood cultures negative x48 hours  7)HTN--- BP has improved okay to restart carvedilol 25 mg bid and  may use IV labetalol when necessary  Every 4 hours for systolic blood pressure over 160 mmhg  8)FEN--hyponatremia/hypokalemia----low sodium most likely due to [employer] Potomania/alcohol abuse, sodium is improving now up to 134 from 128 on admission, replace potassium,  magnesium is normal  9)Obesity/OSA--CPAP nightly, obesity complicates overall care  Disposition/Need for in-Hospital Stay- patient unable to be discharged at this time due to tachycardia/tremors consistent with delirium tremens, as well as fevers and possible infectious process cultures are pending  Code Status : Full  Disposition Plan  : TBD  Consults  :  N/a  DVT Prophylaxis  :   SCDs   Lab Results  Component Value Date   PLT 55 (L) 10/16/2017   Inpatient Medications  Scheduled Meds: . diazepam  2 mg Oral  TID  . folic acid  1 mg Oral Daily  . LORazepam  0-4 mg Intravenous Q6H   Followed by  . [START ON 10/17/2017] LORazepam  0-4 mg Intravenous Q12H  . LORazepam  2 mg Intravenous Once  . multivitamin with minerals  1 tablet Oral Daily  . potassium chloride  40 mEq Oral Once  . thiamine  100 mg Oral Daily   Or  . thiamine  100 mg Intravenous Daily   Continuous  Infusions: . ceFEPime (MAXIPIME) IV 2 g (10/16/17 0914)  . dextrose 5 % and 0.9 % NaCl with KCl 20 mEq/L 100 mL/hr at 10/16/17 1004   PRN Meds:.acetaminophen **OR** acetaminophen, ipratropium-albuterol, labetalol, LORazepam **OR** LORazepam, ondansetron **OR** ondansetron (ZOFRAN) IV    Anti-infectives (From admission, onward)   Start     Dose/Rate Route Frequency Ordered Stop   10/16/17 0600  vancomycin (VANCOCIN) IVPB 1000 mg/200 mL premix  Status:  Discontinued     1,000 mg 200 mL/hr over 60 Minutes Intravenous Every 12 hours 10/15/17 1624 10/16/17 1027   10/16/17 0100  ceFEPIme (MAXIPIME) 2 g in sodium chloride 0.9 % 100 mL IVPB     2 g 200 mL/hr over 30 Minutes Intravenous Every 8 hours 10/15/17 1622     10/15/17 1800  vancomycin (VANCOCIN) IVPB 1000 mg/200 mL premix     1,000 mg 200 mL/hr over 60 Minutes Intravenous  Once 10/15/17 1618 10/15/17 1954   10/15/17 1700  vancomycin (VANCOCIN) IVPB 1000 mg/200 mL premix     1,000 mg 200 mL/hr over 60 Minutes Intravenous  Once 10/15/17 1618 10/15/17 1849   10/15/17 1615  ceFEPIme (MAXIPIME) 2 g in sodium chloride 0.9 % 100 mL IVPB     2 g 200 mL/hr over 30 Minutes Intravenous  Once 10/15/17 1615 10/15/17 1749   10/15/17 1615  metroNIDAZOLE (FLAGYL) IVPB 500 mg  Status:  Discontinued     500 mg 100 mL/hr over 60 Minutes Intravenous Every 8 hours 10/15/17 1615 10/15/17 2057   10/15/17 1615  vancomycin (VANCOCIN) IVPB 1000 mg/200 mL premix  Status:  Discontinued     1,000 mg 200 mL/hr over 60 Minutes Intravenous  Once 10/15/17 1615 10/15/17 1618        Objective:   Vitals:   10/16/17 0700 10/16/17 0724 10/16/17 0800 10/16/17 0900  BP: 138/90  (!) 146/90 139/85  Pulse: 98 (!) 128 (!) 112 96  Resp: (!) 22 18 16  (!) 25  Temp:  100.1 F (37.8 C)    TempSrc:  Axillary    SpO2: 95% 97% 97% 94%  Weight:      Height:        Wt Readings from Last 3 Encounters:  10/16/17 99.4 kg  08/31/16 119.4 kg  12/27/15 134.7 kg     Intake/Output Summary (Last 24 hours) at 10/16/2017 1031 Last data filed at 10/16/2017 0956 Gross per 24 hour  Intake 3835.77 ml  Output 701 ml  Net 3134.77 ml   Physical Exam Patient is examined daily including today on 10/16/17 , exams remain the same as of yesterday except that has changed   Gen:- Awake Alert, anxious, somewhat restless HEENT:- [place].AT, No sclera icterus Neck-Supple Neck,No JVD,.  Lungs-  CTAB , no wheezing CV- S1, S2 normal, tachycardic heart rate around 120 bpm Abd-  +ve B.Sounds, Abd Soft, No tenderness,    Extremity/Skin:- No  edema,   good pulses Psych-affect is ANXIOUS, oriented x3 Neuro-no new focal deficits, +ve Tremors   Data  Review:   Micro Results Recent Results (from the past 240 hour(s))  Culture, blood (routine x 2)     Status: None (Preliminary result)   Collection Time: 10/15/17  4:46 PM  Result Value Ref Range Status   Specimen Description BLOOD LEFT FOREARM DRAWN BY RN  Final   Special Requests   Final    BOTTLES DRAWN AEROBIC AND ANAEROBIC Blood Culture adequate volume   Culture   Final    NO GROWTH < 24 HOURS Performed at [facility] [address], [place], [place] [address]    Report Status PENDING  Incomplete  Culture, blood (routine x 2)     Status: None (Preliminary result)   Collection Time: 10/15/17  4:55 PM  Result Value Ref Range Status   Specimen Description BLOOD RIGHT HAND DRAWN BY RN  Final   Special Requests   Final    BOTTLES DRAWN AEROBIC AND ANAEROBIC Blood Culture results may not be optimal due to an excessive volume of blood received in culture bottles   Culture   Final    NO GROWTH < 24 HOURS Performed at [facility], [address] [place], [place] [address]    Report Status PENDING  Incomplete  MRSA PCR Screening     Status: None   Collection Time: 10/15/17  8:12 PM  Result Value Ref Range Status   MRSA by PCR NEGATIVE NEGATIVE Final    Comment:        The GeneXpert MRSA Assay (FDA approved for NASAL  specimens only), is one component of a comprehensive MRSA colonization surveillance program. It is not intended to diagnose MRSA infection nor to guide or monitor treatment for MRSA infections. Performed at [facility], [address] [place], [place] [address]    Radiology Reports Dg Chest 2 View  Result Date: 10/15/2017 CLINICAL DATA:  Vomiting.  Alcohol abuse. EXAM: CHEST - 2 VIEW COMPARISON:  09/12/2012 FINDINGS: The cardiac silhouette remains borderline enlarged. The posterior costophrenic angles were excluded on the lateral radiograph. No airspace consolidation, edema, sizable pleural effusion, or pneumothorax is identified. No acute osseous abnormality is seen. IMPRESSION: No active cardiopulmonary disease. Electronically Signed   By: Sebastian Ache M.D.   On: 10/15/2017 17:06   Ct Head Wo Contrast  Result Date: 10/15/2017 CLINICAL DATA:  Altered mental status with vomiting. EXAM: CT HEAD WITHOUT CONTRAST TECHNIQUE: Contiguous axial images were obtained from the base of the skull through the vertex without intravenous contrast. COMPARISON:  August 20, 2015 FINDINGS: Brain: The ventricles are normal in size and configuration. There is no intracranial mass, hemorrhage, extra-axial fluid collection, or midline shift. Gray-white compartments appear normal. No evident acute infarct. Vascular: No hyperdense vessel. There is no appreciable vascular calcification. Skull: Bony calvarium appears intact. SinusesOrbits: Ethmoid air cell complex. There is mucosal thickening involving several ethmoid air cells as well more anteriorly. Other visualized paranasal sinuses are clear. Frontal sinuses are hypoplastic. Visualized orbits appear symmetric bilaterally. Other: Mastoid air cells are clear. IMPRESSION: Mild paranasal sinus disease.  Study otherwise unremarkable. Electronically Signed   By: Bretta Bang III M.D.   On: 10/15/2017 16:52    CBC Recent Labs  Lab 10/15/17 1459 10/16/17 0401  WBC  7.0 4.6  HGB 14.4 12.3*  HCT 40.4 35.9*  PLT 50* 55*  MCV 90.4 93.5  MCH 32.2 32.0  MCHC 35.6 34.3  RDW 14.4 14.7  LYMPHSABS 0.3*  --   MONOABS 1.1*  --   EOSABS 0.0  --  BASOSABS 0.0  --     Chemistries  Recent Labs  Lab 10/15/17 1459 10/16/17 0401  NA 128* 134*  K 3.3* 3.0*  CL 78* 91*  CO2 23 28  GLUCOSE 141* 119*  BUN 58* 44*  CREATININE 1.83* 0.94  CALCIUM 9.3 8.8*  MG 2.2  --   AST 186*  --   ALT 108*  --   ALKPHOS 114  --   BILITOT 5.1*  --    ------------------------------------------------------------------------------------------------------------------ No results for input(s): CHOL, HDL, LDLCALC, TRIG, CHOLHDL, LDLDIRECT in the last 72 hours.  Lab Results  Component Value Date   HGBA1C 5.5 01/14/2015   ------------------------------------------------------------------------------------------------------------------ No results for input(s): TSH, T4TOTAL, T3FREE, THYROIDAB in the last 72 hours.  Invalid input(s): FREET3 ------------------------------------------------------------------------------------------------------------------ No results for input(s): VITAMINB12, FOLATE, FERRITIN, TIBC, IRON, RETICCTPCT in the last 72 hours.  Coagulation profile Recent Labs  Lab 10/15/17 1459  INR 1.06   No results for input(s): DDIMER in the last 72 hours.  Cardiac Enzymes No results for input(s): CKMB, TROPONINI, MYOGLOBIN in the last 168 hours.  Invalid input(s): CK ------------------------------------------------------------------------------------------------------------------ No results found for: BNP   Shon Hale M.D on 10/16/2017 at 10:31 AM  Pager---[phone] Go to [email or web address] - password [email or web address] for contact info  [facility] - Office  [phone]

## 2017-10-17 LAB — COMPREHENSIVE METABOLIC PANEL
ALT: 94 U/L — AB (ref 0–44)
AST: 230 U/L — ABNORMAL HIGH (ref 15–41)
Albumin: 2.8 g/dL — ABNORMAL LOW (ref 3.5–5.0)
Alkaline Phosphatase: 81 U/L (ref 38–126)
Anion gap: 8 (ref 5–15)
BUN: 23 mg/dL — ABNORMAL HIGH (ref 6–20)
CHLORIDE: 100 mmol/L (ref 98–111)
CO2: 28 mmol/L (ref 22–32)
CREATININE: 0.54 mg/dL — AB (ref 0.61–1.24)
Calcium: 9 mg/dL (ref 8.9–10.3)
GFR calc non Af Amer: >60 mL/min (ref >60)
Glucose, Bld: 134 mg/dL — ABNORMAL HIGH (ref 70–99)
Potassium: 3.2 mmol/L — ABNORMAL LOW (ref 3.5–5.1)
SODIUM: 136 mmol/L (ref 135–145)
Total Bilirubin: 1.8 mg/dL — ABNORMAL HIGH (ref 0.3–1.2)
Total Protein: 5.8 g/dL — ABNORMAL LOW (ref 6.5–8.1)

## 2017-10-17 MED ORDER — HYDROXYZINE HCL 25 MG PO TABS
50.0000 mg | ORAL_TABLET | Freq: Once | ORAL | Status: AC
  Administered 2017-10-17: 50 mg via ORAL
  Filled 2017-10-17: qty 2

## 2017-10-17 MED ORDER — SODIUM CHLORIDE 0.9 % IV SOLN
2.0000 g | Freq: Three times a day (TID) | INTRAVENOUS | Status: DC
  Administered 2017-10-17 – 2017-10-19 (×6): 2 g via INTRAVENOUS
  Filled 2017-10-17 (×12): qty 2

## 2017-10-17 MED ORDER — LOPERAMIDE HCL 2 MG PO CAPS
4.0000 mg | ORAL_CAPSULE | Freq: Once | ORAL | Status: AC
  Administered 2017-10-17: 4 mg via ORAL
  Filled 2017-10-17: qty 2

## 2017-10-17 MED ORDER — POTASSIUM CHLORIDE CRYS ER 20 MEQ PO TBCR
40.0000 meq | EXTENDED_RELEASE_TABLET | Freq: Once | ORAL | Status: AC
  Administered 2017-10-17: 40 meq via ORAL
  Filled 2017-10-17: qty 2

## 2017-10-17 MED ORDER — SODIUM CHLORIDE 0.9 % IV SOLN
2.0000 g | INTRAVENOUS | Status: DC
  Administered 2017-10-17: 2 g via INTRAVENOUS
  Filled 2017-10-17 (×2): qty 20

## 2017-10-17 NOTE — Progress Notes (Signed)
Patient Demographics:    Malik Edwards, is a 42 y.o. male, DOB - August 17, 1975, ZOX:[id number]  Admit date - 10/15/2017   Admitting Physician Ejiroghene Wendall Stade, MD  Outpatient Primary MD for the patient is Patient, No Pcp Per  LOS - 2   Chief Complaint  Patient presents with  . Emesis        Subjective:    Malik Edwards today has no fevers, no emesis,  No chest pain, tremors appear to be improving, tachycardia improving, requesting solid food  Assessment  & Plan :    Principal Problem:   Alcohol withdrawal (HCC) Active Problems:   Paroxysmal atrial flutter (HCC)   Cardiomyopathy- etiol uindetermined- EF reportedly 35-40%   HTN (hypertension)   OSA (obstructive sleep apnea)   MDD (major depressive disorder), recurrent severe, without psychosis (HCC)  Brief Summary 42 y.o. male with medical history significant for alcohol and tobacco abuse, depression, OSA, paroxysmal atrial flutter admitted on 10/15/2017 with persistent emesis elevated lipase and concerns about alcohol withdrawal.   Plan:- 1)Alcohol Withdrawal/DTs----some tremors persist, intermittent tachycardia persist, last alcoholic intake 10/14/2017, binge drinking heavily for the last couple of weeks prior to admission,c/n  Valium 2 mg 3 times daily scheduled for the next couple of days, continue IV Lorazepam per [facility] protocol, continue multivitamin/folic acid/thiamine,, continue IV fluids  2)AKI----acute kidney injury , prerenal due to dehydration in the setting of persistent emesis and decreased oral intake,   creatinine on admission= 1.83, baseline creatinine = 0.73, creatinine is now= 0.94, renally adjust medications, avoid nephrotoxic agents/dehydration/hypotension, continue to hold lisinopril  3) Gram-positive bacteremia----continue cefepime pending final cultures, we need echocardiogram to rule out endocarditis, further fevers  4)H/o  PAFlutter--- appears stable at this time, patient appears to be in sinus rhythm at this time, restart Coreg 25 mg bid , probably not a good candidate for anticoagulation given significant alcohol abuse and thrombocytopenia  5)Thrombocytopenia and Elevated LFTs--- most likely due to alcohol abuse, AST to ALT ratio is almost 2:1, anticipate improvement in platelet count with cessation of alcohol use, no bleeding concerns, platelet count is up to 55K  6)Fevers--- ???  If he aspirated, chest x-ray on admission without acute findings, stop vancomycin, may DC cefepime if blood cultures negative x48 hours  7)HTN--- BP has improved okay to restart carvedilol 25 mg bid and  may use IV labetalol when necessary  Every 4 hours for systolic blood pressure over 160 mmhg  8)FEN--hyponatremia/hypokalemia----low sodium most likely due to [employer] Potomania/alcohol abuse, sodium is improving now up to 134 from 128 on admission, replace potassium,  magnesium is normal  9)Acute Pancreatitis--- no further emesis, some nausea persist, requesting solid food, continue Protonix, , lipase was 205 on admission, advance diet to solids/GI soft  10)Obesity/OSA--CPAP nightly, obesity complicates overall care  Disposition/Need for in-Hospital Stay- patient unable to be discharged at this time due to tachycardia/tremors consistent with delirium tremens, as well as fevers with gram-positive cocci in his blood/bacteremia  Code Status : Full  Disposition Plan  : TBD  Consults  :  N/a  DVT Prophylaxis  :   SCDs   Lab Results  Component Value Date   PLT 55 (L) 10/16/2017   Inpatient Medications  Scheduled Meds: . carvedilol  25  mg Oral BID WC  . diazepam  2 mg Oral TID  . folic acid  1 mg Oral Daily  . multivitamin with minerals  1 tablet Oral Daily  . nicotine  21 mg Transdermal Daily  . pantoprazole  40 mg Oral BID  . thiamine  100 mg Oral Daily   Or  . thiamine  100 mg Intravenous Daily   Continuous Infusions: .  sodium chloride 100 mL/hr at 10/17/17 1147  . ceFEPime (MAXIPIME) IV     PRN Meds:.acetaminophen **OR** acetaminophen, ipratropium-albuterol, labetalol, LORazepam **FOLLOWED BY** [START ON 10/22/2017] LORazepam, LORazepam **OR** LORazepam, ondansetron **OR** ondansetron (ZOFRAN) IV    Anti-infectives (From admission, onward)   Start     Dose/Rate Route Frequency Ordered Stop   10/17/17 1600  ceFEPIme (MAXIPIME) 2 g in sodium chloride 0.9 % 100 mL IVPB     2 g 200 mL/hr over 30 Minutes Intravenous Every 8 hours 10/17/17 1206     10/17/17 0830  cefTRIAXone (ROCEPHIN) 2 g in sodium chloride 0.9 % 100 mL IVPB  Status:  Discontinued     2 g 200 mL/hr over 30 Minutes Intravenous Every 24 hours 10/17/17 0822 10/17/17 1206   10/16/17 0600  vancomycin (VANCOCIN) IVPB 1000 mg/200 mL premix  Status:  Discontinued     1,000 mg 200 mL/hr over 60 Minutes Intravenous Every 12 hours 10/15/17 1624 10/16/17 1027   10/16/17 0100  ceFEPIme (MAXIPIME) 2 g in sodium chloride 0.9 % 100 mL IVPB  Status:  Discontinued     2 g 200 mL/hr over 30 Minutes Intravenous Every 8 hours 10/15/17 1622 10/17/17 0821   10/15/17 1800  vancomycin (VANCOCIN) IVPB 1000 mg/200 mL premix     1,000 mg 200 mL/hr over 60 Minutes Intravenous  Once 10/15/17 1618 10/15/17 1954   10/15/17 1700  vancomycin (VANCOCIN) IVPB 1000 mg/200 mL premix     1,000 mg 200 mL/hr over 60 Minutes Intravenous  Once 10/15/17 1618 10/15/17 1849   10/15/17 1615  ceFEPIme (MAXIPIME) 2 g in sodium chloride 0.9 % 100 mL IVPB     2 g 200 mL/hr over 30 Minutes Intravenous  Once 10/15/17 1615 10/15/17 1749   10/15/17 1615  metroNIDAZOLE (FLAGYL) IVPB 500 mg  Status:  Discontinued     500 mg 100 mL/hr over 60 Minutes Intravenous Every 8 hours 10/15/17 1615 10/15/17 2057   10/15/17 1615  vancomycin (VANCOCIN) IVPB 1000 mg/200 mL premix  Status:  Discontinued     1,000 mg 200 mL/hr over 60 Minutes Intravenous  Once 10/15/17 1615 10/15/17 1618         Objective:   Vitals:   10/17/17 0500 10/17/17 0600 10/17/17 1105 10/17/17 1402  BP: (!) 141/85 132/90 125/84 (!) 136/99  Pulse:   79 80  Resp: (!) 22 (!) 23 20 19   Temp: 98.7 F (37.1 C)  98.9 F (37.2 C) (!) 97.4 F (36.3 C)  TempSrc: Oral  Oral Oral  SpO2:   98% 96%  Weight: 104.8 kg     Height:        Wt Readings from Last 3 Encounters:  10/17/17 104.8 kg  08/31/16 119.4 kg  12/27/15 134.7 kg    Intake/Output Summary (Last 24 hours) at 10/17/2017 1616 Last data filed at 10/17/2017 1300 Gross per 24 hour  Intake 3578.59 ml  Output -  Net 3578.59 ml   Physical Exam Patient is examined daily including today on 10/17/17 , exams remain the same as of yesterday except  that has changed   Gen:- Awake Alert, less anxious, cooperative HEENT:- [place].AT, No sclera icterus Neck-Supple Neck,No JVD,.  Lungs-  CTAB , no wheezing CV- S1, S2 normal, less tachycardic tachycardic Abd-  +ve B.Sounds, Abd Soft, No tenderness,    Extremity/Skin:- No  edema,   good pulses Psych-affect is slightly anxious, oriented x3 Neuro-no new focal deficits, improving tremors   Data Review:   Micro Results Recent Results (from the past 240 hour(s))  Culture, blood (routine x 2)     Status: None (Preliminary result)   Collection Time: 10/15/17  4:46 PM  Result Value Ref Range Status   Specimen Description   Final    BLOOD LEFT FOREARM DRAWN BY RN Performed at [facility], [address] [place], [place] [address]    Special Requests   Final    BOTTLES DRAWN AEROBIC AND ANAEROBIC Blood Culture adequate volume Performed at [facility], [address] [place], [place] [address]    Culture  Setup Time   Final    GRAM POSITIVE COCCI Gram Stain Report Called to,Read Back By and Verified With: DAVIS,K ON 10/16/17 AT 1930BY LOY,C PERFORMED AT [facility] CRITICAL RESULT CALLED TO, READ BACK BY AND VERIFIED WITH: K DAVIS RN [id number] 10/16/17 A BROWNING GRAM VARIABLE ROD CRITICAL RESULT CALLED TO, READ BACK BY AND  VERIFIED WITH: COFFI PHARMD AT 0730 ON [id number] BY SJW    Culture   Final    CULTURE REINCUBATED FOR BETTER GROWTH Performed at [facility], [address], [place], [place] [address]    Report Status PENDING  Incomplete  Blood Culture ID Panel (Reflexed)     Status: None   Collection Time: 10/15/17  4:46 PM  Result Value Ref Range Status   Enterococcus species NOT DETECTED NOT DETECTED Final   Listeria monocytogenes NOT DETECTED NOT DETECTED Final   Staphylococcus species NOT DETECTED NOT DETECTED Final   Staphylococcus aureus NOT DETECTED NOT DETECTED Final   Streptococcus species NOT DETECTED NOT DETECTED Final   Streptococcus agalactiae NOT DETECTED NOT DETECTED Final   Streptococcus pneumoniae NOT DETECTED NOT DETECTED Final   Streptococcus pyogenes NOT DETECTED NOT DETECTED Final   Acinetobacter baumannii NOT DETECTED NOT DETECTED Final   Enterobacteriaceae species NOT DETECTED NOT DETECTED Final   Enterobacter cloacae complex NOT DETECTED NOT DETECTED Final   Escherichia coli NOT DETECTED NOT DETECTED Final   Klebsiella oxytoca NOT DETECTED NOT DETECTED Final   Klebsiella pneumoniae NOT DETECTED NOT DETECTED Final   Proteus species NOT DETECTED NOT DETECTED Final   Serratia marcescens NOT DETECTED NOT DETECTED Final   Haemophilus influenzae NOT DETECTED NOT DETECTED Final   Neisseria meningitidis NOT DETECTED NOT DETECTED Final   Pseudomonas aeruginosa NOT DETECTED NOT DETECTED Final   Candida albicans NOT DETECTED NOT DETECTED Final   Candida glabrata NOT DETECTED NOT DETECTED Final   Candida krusei NOT DETECTED NOT DETECTED Final   Candida parapsilosis NOT DETECTED NOT DETECTED Final   Candida tropicalis NOT DETECTED NOT DETECTED Final    Comment: Performed at [facility], [address] [place], [place] [address]  Culture, blood (routine x 2)     Status: None (Preliminary result)   Collection Time: 10/15/17  4:55 PM  Result Value Ref Range Status    Specimen Description BLOOD RIGHT HAND DRAWN BY RN  Final   Special Requests   Final    BOTTLES DRAWN AEROBIC AND ANAEROBIC Blood Culture results may not be optimal due to  an excessive volume of blood received in culture bottles   Culture   Final    NO GROWTH 2 DAYS Performed at [facility], [address] [place], [place] [address]    Report Status PENDING  Incomplete  Urine culture     Status: None   Collection Time: 10/15/17  5:15 PM  Result Value Ref Range Status   Specimen Description   Final    URINE, CLEAN CATCH Performed at [facility], [address], [place], [place] [address]    Special Requests   Final    NONE Performed at [facility], [address], [place], [place] [address]    Culture   Final    NO GROWTH Performed at [facility], [address] [place], [place] [address]    Report Status 10/16/2017 FINAL  Final  MRSA PCR Screening     Status: None   Collection Time: 10/15/17  8:12 PM  Result Value Ref Range Status   MRSA by PCR NEGATIVE NEGATIVE Final    Comment:        The GeneXpert MRSA Assay (FDA approved for NASAL specimens only), is one component of a comprehensive MRSA colonization surveillance program. It is not intended to diagnose MRSA infection nor to guide or monitor treatment for MRSA infections. Performed at [facility], [address] [place], [place] [address]    Radiology Reports Dg Chest 2 View  Result Date: 10/15/2017 CLINICAL DATA:  Vomiting.  Alcohol abuse. EXAM: CHEST - 2 VIEW COMPARISON:  09/12/2012 FINDINGS: The cardiac silhouette remains borderline enlarged. The posterior costophrenic angles were excluded on the lateral radiograph. No airspace consolidation, edema, sizable pleural effusion, or pneumothorax is identified. No acute osseous abnormality is seen. IMPRESSION: No active cardiopulmonary disease. Electronically Signed   By: Sebastian Ache M.D.   On: 10/15/2017 17:06   Ct Head Wo Contrast  Result Date:  10/15/2017 CLINICAL DATA:  Altered mental status with vomiting. EXAM: CT HEAD WITHOUT CONTRAST TECHNIQUE: Contiguous axial images were obtained from the base of the skull through the vertex without intravenous contrast. COMPARISON:  August 20, 2015 FINDINGS: Brain: The ventricles are normal in size and configuration. There is no intracranial mass, hemorrhage, extra-axial fluid collection, or midline shift. Gray-white compartments appear normal. No evident acute infarct. Vascular: No hyperdense vessel. There is no appreciable vascular calcification. Skull: Bony calvarium appears intact. SinusesOrbits: Ethmoid air cell complex. There is mucosal thickening involving several ethmoid air cells as well more anteriorly. Other visualized paranasal sinuses are clear. Frontal sinuses are hypoplastic. Visualized orbits appear symmetric bilaterally. Other: Mastoid air cells are clear. IMPRESSION: Mild paranasal sinus disease.  Study otherwise unremarkable. Electronically Signed   By: Bretta Bang III M.D.   On: 10/15/2017 16:52    CBC Recent Labs  Lab 10/15/17 1459 10/16/17 0401  WBC 7.0 4.6  HGB 14.4 12.3*  HCT 40.4 35.9*  PLT 50* 55*  MCV 90.4 93.5  MCH 32.2 32.0  MCHC 35.6 34.3  RDW 14.4 14.7  LYMPHSABS 0.3*  --   MONOABS 1.1*  --   EOSABS 0.0  --   BASOSABS 0.0  --     Chemistries  Recent Labs  Lab 10/15/17 1459 10/16/17 0401 10/17/17 0424  NA 128* 134* 136  K 3.3* 3.0* 3.2*  CL 78* 91* 100  CO2 23 28 28   GLUCOSE 141* 119* 134*  BUN 58* 44* 23*  CREATININE 1.83* 0.94 0.54*  CALCIUM 9.3 8.8* 9.0  MG 2.2  --   --  AST 186*  --  230*  ALT 108*  --  94*  ALKPHOS 114  --  81  BILITOT 5.1*  --  1.8*   ------------------------------------------------------------------------------------------------------------------ No results for input(s): CHOL, HDL, LDLCALC, TRIG, CHOLHDL, LDLDIRECT in the last 72 hours.  Lab Results  Component Value Date   HGBA1C 5.5 01/14/2015    ------------------------------------------------------------------------------------------------------------------ No results for input(s): TSH, T4TOTAL, T3FREE, THYROIDAB in the last 72 hours.  Invalid input(s): FREET3 ------------------------------------------------------------------------------------------------------------------ No results for input(s): VITAMINB12, FOLATE, FERRITIN, TIBC, IRON, RETICCTPCT in the last 72 hours.  Coagulation profile Recent Labs  Lab 10/15/17 1459  INR 1.06   No results for input(s): DDIMER in the last 72 hours.  Cardiac Enzymes No results for input(s): CKMB, TROPONINI, MYOGLOBIN in the last 168 hours.  Invalid input(s): CK ------------------------------------------------------------------------------------------------------------------ No results found for: BNP   Shon Hale M.D on 10/17/2017 at 4:16 PM  Pager---[phone] Go to [email or web address] - password [email or web address] for contact info  [facility] - Office  [phone]

## 2017-10-18 ENCOUNTER — Inpatient Hospital Stay (HOSPITAL_COMMUNITY): Payer: Self-pay

## 2017-10-18 DIAGNOSIS — I10 Essential (primary) hypertension: Secondary | ICD-10-CM

## 2017-10-18 LAB — CBC
HCT: 33.6 % — ABNORMAL LOW (ref 39.0–52.0)
Hemoglobin: 11.5 g/dL — ABNORMAL LOW (ref 13.0–17.0)
MCH: 32.6 pg (ref 26.0–34.0)
MCHC: 34.2 g/dL (ref 30.0–36.0)
MCV: 95.2 fL (ref 78.0–100.0)
Platelets: 103 10*3/uL — ABNORMAL LOW (ref 150–400)
RBC: 3.53 MIL/uL — AB (ref 4.22–5.81)
RDW: 14.7 % (ref 11.5–15.5)
WBC: 4.5 10*3/uL (ref 4.0–10.5)

## 2017-10-18 LAB — COMPREHENSIVE METABOLIC PANEL
ALT: 179 U/L — AB (ref 0–44)
AST: 378 U/L — ABNORMAL HIGH (ref 15–41)
Albumin: 2.6 g/dL — ABNORMAL LOW (ref 3.5–5.0)
Alkaline Phosphatase: 79 U/L (ref 38–126)
Anion gap: 6 (ref 5–15)
BUN: 15 mg/dL (ref 6–20)
CO2: 22 mmol/L (ref 22–32)
CREATININE: 0.49 mg/dL — AB (ref 0.61–1.24)
Calcium: 8 mg/dL — ABNORMAL LOW (ref 8.9–10.3)
Chloride: 109 mmol/L (ref 98–111)
Glucose, Bld: 117 mg/dL — ABNORMAL HIGH (ref 70–99)
Potassium: 3.3 mmol/L — ABNORMAL LOW (ref 3.5–5.1)
Sodium: 137 mmol/L (ref 135–145)
TOTAL PROTEIN: 5.4 g/dL — AB (ref 6.5–8.1)
Total Bilirubin: 1.1 mg/dL (ref 0.3–1.2)

## 2017-10-18 LAB — ECHOCARDIOGRAM COMPLETE
Height: 69 in
Weight: 3696.67 oz

## 2017-10-18 MED ORDER — ESCITALOPRAM OXALATE 10 MG PO TABS
10.0000 mg | ORAL_TABLET | Freq: Every day | ORAL | Status: DC
  Administered 2017-10-18 – 2017-10-21 (×4): 10 mg via ORAL
  Filled 2017-10-18 (×4): qty 1

## 2017-10-18 MED ORDER — ALUM & MAG HYDROXIDE-SIMETH 200-200-20 MG/5ML PO SUSP
30.0000 mL | ORAL | Status: DC | PRN
  Administered 2017-10-18 – 2017-10-20 (×7): 30 mL via ORAL
  Filled 2017-10-18 (×7): qty 30

## 2017-10-18 NOTE — Progress Notes (Signed)
Please note patient will be NPO at midnight per protocol for abdominal US in am per radiology. Will inform oncoming RN.

## 2017-10-18 NOTE — Progress Notes (Signed)
Patient refusing bed alarm, educated patient regarding safety and use of bed alarm. Patient has call light in reach.

## 2017-10-18 NOTE — Progress Notes (Signed)
*  PRELIMINARY RESULTS* Echocardiogram 2D Echocardiogram has been performed.  Malik Edwards 10/18/2017, 2:04 PM

## 2017-10-18 NOTE — Progress Notes (Signed)
Patient Demographics:    Malik Edwards, is a 42 y.o. male, DOB - 12-Jul-1975, ZOX:[id number]  Admit date - 10/15/2017   Admitting Physician Ejiroghene Wendall Stade, MD  Outpatient Primary MD for the patient is Patient, No Pcp Per  LOS - 3   Chief Complaint  Patient presents with  . Emesis        Subjective:    Malik Edwards today has no fevers, no emesis,  No chest pain, complains of fatigue and malaise, tolerating solid food well  Assessment  & Plan :    Principal Problem:   Alcohol withdrawal (HCC) Active Problems:   Paroxysmal atrial flutter (HCC)   Cardiomyopathy- etiol uindetermined- EF reportedly 35-40%   HTN (hypertension)   OSA (obstructive sleep apnea)   MDD (major depressive disorder), recurrent severe, without psychosis (HCC)  Echo 10/18/17 EF 30 to 35% Tricuspid valve:  Acoustic shadowing noted and intermittently seen echodensity on atrial side of leaflets in subcostal views. In patient with bacteremia, cannot exclude vegetation. Consider TEE for further evaluation if clinically indicated.  Doppler:  There was trivial regurgitation.  Brief Summary 42 y.o. male with medical history significant for alcohol and tobacco abuse, depression, OSA, paroxysmal atrial flutter admitted on 10/15/2017 with persistent emesis elevated lipase and concerns about alcohol withdrawal.   Plan:- 1)Alcohol Withdrawal/DTs----overall DT symptoms appears to be resolving, last alcoholic intake 10/14/2017, binge drinking heavily for the last couple of weeks prior to admission,c/n  Valium 2 mg 3 times daily scheduled for the next couple of days, continue IV Lorazepam per [facility]WA protocol, continue multivitamin/folic acid/thiamine,, continue IV fluids  2)AKI----acute kidney injury , prerenal due to dehydration in the setting of persistent emesis and decreased oral intake,   creatinine on admission= 1.83, baseline  creatinine = 0.73, creatinine is now= 0.94, renally adjust medications, avoid nephrotoxic agents/dehydration/hypotension, continue to hold lisinopril  3) Gram-positive bacteremia----continue cefepime pending final cultures, unable to rule out endocarditis with TTE, cardiologist recommend TEE, will transfer to [facility] on 10/21/2017 for TEE  4)H/o PAFlutter--- appears stable at this time, patient appears to be in sinus rhythm at this time, restart Coreg 25 mg bid , probably not a good candidate for anticoagulation given significant alcohol abuse and thrombocytopenia  5)Thrombocytopenia and Elevated LFTs--- most likely due to alcohol abuse, AST to ALT ratio is almost 2:1, anticipate improvement in platelet count with cessation of alcohol use, no bleeding concerns, LFTs remain elevated, get liver ultrasound, hepatitis profile pending  6)Fevers--- fever appears to have resolved, , chest x-ray on admission without acute findings, stop vancomycin, m c/n cefepime pending final ID and sensitivity of blood cultures  7)HTN--- BP has improved okay to restart carvedilol 25 mg bid and  may use IV labetalol when necessary  Every 4 hours for systolic blood pressure over 160 mmhg  8)FEN--hyponatremia/hypokalemia----low sodium most likely due to [employer] Potomania/alcohol abuse, sodium is improving now up to 134 from 128 on admission, replace potassium,  magnesium is normal  9)Acute Pancreatitis--- no further emesis, some nausea persist, , continue Protonix, , lipase was 205 on admission, tolerating GI soft diet well  10)Obesity/OSA--CPAP nightly, obesity complicates overall care  11)Mood Disorder--- denies SI/HI, agreeable to trial of Lexapro  Disposition/Need for in-Hospital Stay- patient unable to  be discharged at this time due to  fevers with gram-positive cocci in his blood/bacteremia, awaiting final ID and sensitivity of blood cultures in the meantime patient continues to need IV antibiotics, may need  TEE to rule out endocarditis  Code Status : Full  Disposition Plan  : TBD  Consults  :  N/a  DVT Prophylaxis  :   SCDs   Lab Results  Component Value Date   PLT 103 (L) 10/18/2017   Inpatient Medications  Scheduled Meds: . carvedilol  25 mg Oral BID WC  . escitalopram  10 mg Oral Daily  . folic acid  1 mg Oral Daily  . multivitamin with minerals  1 tablet Oral Daily  . nicotine  21 mg Transdermal Daily  . pantoprazole  40 mg Oral BID  . thiamine  100 mg Oral Daily   Or  . thiamine  100 mg Intravenous Daily   Continuous Infusions: . sodium chloride Stopped (10/18/17 1708)  . ceFEPime (MAXIPIME) IV 2 g (10/18/17 1708)   PRN Meds:.acetaminophen **OR** acetaminophen, alum & mag hydroxide-simeth, ipratropium-albuterol, labetalol, LORazepam **FOLLOWED BY** [START ON 10/22/2017] LORazepam, LORazepam **OR** LORazepam, ondansetron **OR** ondansetron (ZOFRAN) IV    Anti-infectives (From admission, onward)   Start     Dose/Rate Route Frequency Ordered Stop   10/17/17 1600  ceFEPIme (MAXIPIME) 2 g in sodium chloride 0.9 % 100 mL IVPB     2 g 200 mL/hr over 30 Minutes Intravenous Every 8 hours 10/17/17 1206     10/17/17 0830  cefTRIAXone (ROCEPHIN) 2 g in sodium chloride 0.9 % 100 mL IVPB  Status:  Discontinued     2 g 200 mL/hr over 30 Minutes Intravenous Every 24 hours 10/17/17 0822 10/17/17 1206   10/16/17 0600  vancomycin (VANCOCIN) IVPB 1000 mg/200 mL premix  Status:  Discontinued     1,000 mg 200 mL/hr over 60 Minutes Intravenous Every 12 hours 10/15/17 1624 10/16/17 1027   10/16/17 0100  ceFEPIme (MAXIPIME) 2 g in sodium chloride 0.9 % 100 mL IVPB  Status:  Discontinued     2 g 200 mL/hr over 30 Minutes Intravenous Every 8 hours 10/15/17 1622 10/17/17 0821   10/15/17 1800  vancomycin (VANCOCIN) IVPB 1000 mg/200 mL premix     1,000 mg 200 mL/hr over 60 Minutes Intravenous  Once 10/15/17 1618 10/15/17 1954   10/15/17 1700  vancomycin (VANCOCIN) IVPB 1000 mg/200 mL premix       1,000 mg 200 mL/hr over 60 Minutes Intravenous  Once 10/15/17 1618 10/15/17 1849   10/15/17 1615  ceFEPIme (MAXIPIME) 2 g in sodium chloride 0.9 % 100 mL IVPB     2 g 200 mL/hr over 30 Minutes Intravenous  Once 10/15/17 1615 10/15/17 1749   10/15/17 1615  metroNIDAZOLE (FLAGYL) IVPB 500 mg  Status:  Discontinued     500 mg 100 mL/hr over 60 Minutes Intravenous Every 8 hours 10/15/17 1615 10/15/17 2057   10/15/17 1615  vancomycin (VANCOCIN) IVPB 1000 mg/200 mL premix  Status:  Discontinued     1,000 mg 200 mL/hr over 60 Minutes Intravenous  Once 10/15/17 1615 10/15/17 1618        Objective:   Vitals:   10/17/17 1943 10/17/17 2115 10/18/17 0605 10/18/17 1322  BP:  117/87 116/84 139/89  Pulse:  78 86 86  Resp:    17  Temp:  98.4 F (36.9 C) 98.1 F (36.7 C) 98.4 F (36.9 C)  TempSrc:  Oral Oral Oral  SpO2: 97% 98%  98% 99%  Weight:      Height:        Wt Readings from Last 3 Encounters:  10/17/17 104.8 kg  08/31/16 119.4 kg  12/27/15 134.7 kg    Intake/Output Summary (Last 24 hours) at 10/18/2017 1740 Last data filed at 10/18/2017 1709 Gross per 24 hour  Intake 2394.79 ml  Output -  Net 2394.79 ml   Physical Exam Patient is examined daily including today on 10/18/17 , exams remain the same as of yesterday except that has changed   Gen:- Awake Alert, less anxious, cooperative HEENT:- [place].AT, No sclera icterus Neck-Supple Neck,No JVD,.  Lungs-  CTAB , no wheezing CV- S1, S2 normal, less tachycardic tachycardic Abd-  +ve B.Sounds, Abd Soft, No tenderness,    Extremity/Skin:- No  edema,   good pulses Psych-affect is slightly anxious, oriented x3 Neuro-no new focal deficits, improving tremors   Data Review:   Micro Results Recent Results (from the past 240 hour(s))  Culture, blood (routine x 2)     Status: None (Preliminary result)   Collection Time: 10/15/17  4:46 PM  Result Value Ref Range Status   Specimen Description   Final    BLOOD LEFT FOREARM DRAWN BY  RN Performed at [facility], [address], [place], [place] [address]    Special Requests   Final    BOTTLES DRAWN AEROBIC AND ANAEROBIC Blood Culture adequate volume Performed at [facility], [address], [place], [place] [address]    Culture  Setup Time   Final    GRAM POSITIVE COCCI Gram Stain Report Called to,Read Back By and Verified With: DAVIS,K ON 10/16/17 AT 1930BY LOY,C PERFORMED AT [facility] CRITICAL RESULT CALLED TO, READ BACK BY AND VERIFIED WITH: K DAVIS RN 0981 10/16/17 A BROWNING GRAM VARIABLE ROD CRITICAL RESULT CALLED TO, READ BACK BY AND VERIFIED WITH: COFFI PHARMD AT 0730 ON [id number] BY SJW    Culture   Final    CULTURE REINCUBATED FOR BETTER GROWTH Performed at [facility], [address] [place], [place] [address]    Report Status PENDING  Incomplete  Blood Culture ID Panel (Reflexed)     Status: None   Collection Time: 10/15/17  4:46 PM  Result Value Ref Range Status   Enterococcus species NOT DETECTED NOT DETECTED Final   Listeria monocytogenes NOT DETECTED NOT DETECTED Final   Staphylococcus species NOT DETECTED NOT DETECTED Final   Staphylococcus aureus NOT DETECTED NOT DETECTED Final   Streptococcus species NOT DETECTED NOT DETECTED Final   Streptococcus agalactiae NOT DETECTED NOT DETECTED Final   Streptococcus pneumoniae NOT DETECTED NOT DETECTED Final   Streptococcus pyogenes NOT DETECTED NOT DETECTED Final   Acinetobacter baumannii NOT DETECTED NOT DETECTED Final   Enterobacteriaceae species NOT DETECTED NOT DETECTED Final   Enterobacter cloacae complex NOT DETECTED NOT DETECTED Final   Escherichia coli NOT DETECTED NOT DETECTED Final   Klebsiella oxytoca NOT DETECTED NOT DETECTED Final   Klebsiella pneumoniae NOT DETECTED NOT DETECTED Final   Proteus species NOT DETECTED NOT DETECTED Final   Serratia marcescens NOT DETECTED NOT DETECTED Final   Haemophilus influenzae NOT DETECTED NOT DETECTED Final   Neisseria meningitidis NOT DETECTED NOT  DETECTED Final   Pseudomonas aeruginosa NOT DETECTED NOT DETECTED Final   Candida albicans NOT DETECTED NOT DETECTED Final   Candida glabrata NOT DETECTED NOT DETECTED Final   Candida krusei NOT DETECTED NOT DETECTED Final   Candida parapsilosis NOT DETECTED NOT DETECTED Final  Candida tropicalis NOT DETECTED NOT DETECTED Final    Comment: Performed at [facility], [address], [place], [place] [address]  Culture, blood (routine x 2)     Status: None (Preliminary result)   Collection Time: 10/15/17  4:55 PM  Result Value Ref Range Status   Specimen Description BLOOD RIGHT HAND DRAWN BY RN  Final   Special Requests   Final    BOTTLES DRAWN AEROBIC AND ANAEROBIC Blood Culture results may not be optimal due to an excessive volume of blood received in culture bottles   Culture   Final    NO GROWTH 3 DAYS Performed at [facility], [address] [place], [place] [address]    Report Status PENDING  Incomplete  Urine culture     Status: None   Collection Time: 10/15/17  5:15 PM  Result Value Ref Range Status   Specimen Description   Final    URINE, CLEAN CATCH Performed at [facility], [address] [place], [place] [address]    Special Requests   Final    NONE Performed at [facility], [address], [place], [place] [address]    Culture   Final    NO GROWTH Performed at [facility], [address], [place], [place] [address]    Report Status 10/16/2017 FINAL  Final  MRSA PCR Screening     Status: None   Collection Time: 10/15/17  8:12 PM  Result Value Ref Range Status   MRSA by PCR NEGATIVE NEGATIVE Final    Comment:        The GeneXpert MRSA Assay (FDA approved for NASAL specimens only), is one component of a comprehensive MRSA colonization surveillance program. It is not intended to diagnose MRSA infection nor to guide or monitor treatment for MRSA infections. Performed at [facility], [address] [place], [place] [address]    Radiology  Reports Dg Chest 2 View  Result Date: 10/15/2017 CLINICAL DATA:  Vomiting.  Alcohol abuse. EXAM: CHEST - 2 VIEW COMPARISON:  09/12/2012 FINDINGS: The cardiac silhouette remains borderline enlarged. The posterior costophrenic angles were excluded on the lateral radiograph. No airspace consolidation, edema, sizable pleural effusion, or pneumothorax is identified. No acute osseous abnormality is seen. IMPRESSION: No active cardiopulmonary disease. Electronically Signed   By: Sebastian Ache M.D.   On: 10/15/2017 17:06   Ct Head Wo Contrast  Result Date: 10/15/2017 CLINICAL DATA:  Altered mental status with vomiting. EXAM: CT HEAD WITHOUT CONTRAST TECHNIQUE: Contiguous axial images were obtained from the base of the skull through the vertex without intravenous contrast. COMPARISON:  August 20, 2015 FINDINGS: Brain: The ventricles are normal in size and configuration. There is no intracranial mass, hemorrhage, extra-axial fluid collection, or midline shift. Gray-white compartments appear normal. No evident acute infarct. Vascular: No hyperdense vessel. There is no appreciable vascular calcification. Skull: Bony calvarium appears intact. SinusesOrbits: Ethmoid air cell complex. There is mucosal thickening involving several ethmoid air cells as well more anteriorly. Other visualized paranasal sinuses are clear. Frontal sinuses are hypoplastic. Visualized orbits appear symmetric bilaterally. Other: Mastoid air cells are clear. IMPRESSION: Mild paranasal sinus disease.  Study otherwise unremarkable. Electronically Signed   By: Bretta Bang III M.D.   On: 10/15/2017 16:52    CBC Recent Labs  Lab 10/15/17 1459 10/16/17 0401 10/18/17 0529  WBC 7.0 4.6 4.5  HGB 14.4 12.3* 11.5*  HCT 40.4 35.9* 33.6*  PLT 50* 55* 103*  MCV 90.4 93.5 95.2  MCH 32.2 32.0 32.6  MCHC 35.6 34.3 34.2  RDW 14.4 14.7 14.7  LYMPHSABS 0.3*  --   --   MONOABS 1.1*  --   --   EOSABS 0.0  --   --   BASOSABS 0.0  --   --      Chemistries  Recent Labs  Lab 10/15/17 1459 10/16/17 0401 10/17/17 0424 10/18/17 0529  NA 128* 134* 136 137  K 3.3* 3.0* 3.2* 3.3*  CL 78* 91* 100 109  CO2 23 28 28 22   GLUCOSE 141* 119* 134* 117*  BUN 58* 44* 23* 15  CREATININE 1.83* 0.94 0.54* 0.49*  CALCIUM 9.3 8.8* 9.0 8.0*  MG 2.2  --   --   --   AST 186*  --  230* 378*  ALT 108*  --  94* 179*  ALKPHOS 114  --  81 79  BILITOT 5.1*  --  1.8* 1.1   ------------------------------------------------------------------------------------------------------------------ No results for input(s): CHOL, HDL, LDLCALC, TRIG, CHOLHDL, LDLDIRECT in the last 72 hours.  Lab Results  Component Value Date   HGBA1C 5.5 01/14/2015   ------------------------------------------------------------------------------------------------------------------ No results for input(s): TSH, T4TOTAL, T3FREE, THYROIDAB in the last 72 hours.  Invalid input(s): FREET3 ------------------------------------------------------------------------------------------------------------------ No results for input(s): VITAMINB12, FOLATE, FERRITIN, TIBC, IRON, RETICCTPCT in the last 72 hours.  Coagulation profile Recent Labs  Lab 10/15/17 1459  INR 1.06   No results for input(s): DDIMER in the last 72 hours.  Cardiac Enzymes No results for input(s): CKMB, TROPONINI, MYOGLOBIN in the last 168 hours.  Invalid input(s): CK ------------------------------------------------------------------------------------------------------------------ No results found for: BNP   Shon Hale M.D on 10/18/2017 at 5:40 PM  Pager---[phone] Go to [email or web address] - password [email or web address] for contact info  [facility] - Office  [phone]

## 2017-10-19 ENCOUNTER — Inpatient Hospital Stay (HOSPITAL_COMMUNITY): Payer: Self-pay

## 2017-10-19 LAB — HEPATITIS PANEL, ACUTE
HCV Ab: <0.1 s/co ratio (ref 0.0–0.9)
HEP B S AG: NEGATIVE
Hep A IgM: NEGATIVE
Hep B C IgM: NEGATIVE

## 2017-10-19 MED ORDER — SODIUM CHLORIDE 0.9 % IV SOLN
2.0000 g | INTRAVENOUS | Status: AC
  Administered 2017-10-19 – 2017-10-21 (×3): 2 g via INTRAVENOUS
  Filled 2017-10-19 (×3): qty 2

## 2017-10-19 MED ORDER — LISINOPRIL 10 MG PO TABS
10.0000 mg | ORAL_TABLET | Freq: Every day | ORAL | Status: DC
  Administered 2017-10-19 – 2017-10-21 (×3): 10 mg via ORAL
  Filled 2017-10-19 (×3): qty 1

## 2017-10-19 NOTE — Progress Notes (Signed)
Patient reported three loose, watery stools this shift. Mid Level notified. New orders placed. Will continue to monitor.

## 2017-10-19 NOTE — Progress Notes (Signed)
  Microbiology update  Blood cultures from 10/15/2017 with possible micrococcus luteus and unclaimed Moraxella  We will de-escalate from cefepime to IV Rocephin pending final ID and sensitivity  ???  Contaminant  Shon Hale, MD

## 2017-10-19 NOTE — Progress Notes (Signed)
Patient high fall risk and refusing bed alarm. Patient educated on importance of safety.

## 2017-10-19 NOTE — Progress Notes (Signed)
Patient Demographics:    Malik Edwards, is a 42 y.o. male, DOB - 1975/05/25, WUJ:[id number]  Admit date - 10/15/2017   Admitting Physician Ejiroghene Wendall Stade, MD  Outpatient Primary MD for the patient is Patient, No Pcp Per  LOS - 4   Chief Complaint  Patient presents with  . Emesis        Subjective:    Malik Edwards today has no fevers, no emesis,  No chest pain, complains of fatigue and malaise, tolerating solid food well, had loose stools  Assessment  & Plan :    Principal Problem:   Alcohol withdrawal (HCC) Active Problems:   Paroxysmal atrial flutter (HCC)   Cardiomyopathy- etiol uindetermined- EF reportedly 35-40%   HTN (hypertension)   OSA (obstructive sleep apnea)   MDD (major depressive disorder), recurrent severe, without psychosis (HCC)  Echo 10/18/17 EF 30 to 35% Tricuspid valve:  Acoustic shadowing noted and intermittently seen echodensity on atrial side of leaflets in subcostal views. In patient with bacteremia, cannot exclude vegetation. Consider TEE for further evaluation if clinically indicated.  Doppler:  There was trivial regurgitation.  Brief Summary 42 y.o. male with medical history significant for alcohol and tobacco abuse, depression, OSA, paroxysmal atrial flutter admitted on 10/15/2017 with persistent emesis elevated lipase and concerns about alcohol withdrawal.   Plan:- 1)Alcohol Withdrawal/DTs----overall DT symptoms appears to be mostly resolved, last alcoholic intake 10/14/2017, binge drinking heavily for  couple of weeks prior to admission, completed Valium detox protocol,, continue IV Lorazepam per [facility] protocol, continue multivitamin/folic acid/thiamine,,   2)AKI----acute kidney injury , prerenal due to dehydration in the setting of persistent emesis and decreased oral intake, AKI resolved with hydration,   creatinine on admission= 1.83, baseline creatinine =  0.73, creatinine is now= normalized, renally adjust medications, avoid nephrotoxic agents/dehydration/hypotension, restart lisinopril 10 mg   3)Bacteremia----blood cultures from 10/15/2017 with gram-positive cocci and gram variable rods, culture was reintubated for better growth, final ID and sensitivity pending , continue cefepime pending final cultures, unable to rule out endocarditis with TTE, cardiologist recommend TEE, hopefully TEE can be done on  10/21/2017 , chest x-ray on admission without acute findings, patient remains afebrile without leukocytosis  4)H/o PAFlutter--- appears stable at this time, patient appears to be in sinus rhythm at this time, restart Coreg 25 mg bid , probably not a good candidate for anticoagulation given significant alcohol abuse and thrombocytopenia  5)Thrombocytopenia and Elevated LFTs--- most likely due to alcohol abuse, AST to ALT ratio is almost 2:1, anticipate improvement in platelet count with cessation of alcohol use, no bleeding concerns, LFTs remain elevated, get liver ultrasound, hepatitis profile pending  6)Alcoholic Hepatitis/fatty liver/elevated LFTs--abdominal ultrasound without acute findings, AST higher than ALT patient appears to have hepatic steatosis in the setting of alcohol abuse, avoid hepatotoxic agents  7)HTN--- BP has improved okay to restart carvedilol 25 mg bid and  may use IV labetalol when necessary  Every 4 hours for systolic blood pressure over 160 mmhg  8)FEN--hyponatremia/hypokalemia----low sodium most likely due to [employer] Potomania/alcohol abuse, sodium is improving now up to 134 from 128 on admission, replace potassium,  magnesium is normal  9)Acute Pancreatitis--- no further emesis, some nausea persist, , continue Protonix, , lipase was 205 on admission, tolerating GI  soft diet well  10)Obesity/OSA--CPAP nightly, obesity complicates overall care  11)Mood Disorder--- denies SI/HI, agreeable to trial of Lexapro  Disposition/Need  for in-Hospital Stay- patient unable to be discharged at this time due to gram-positive cocci and GVR in his blood/bacteremia, awaiting final ID and sensitivity of blood cultures in the meantime patient continues to need IV antibiotics, may need TEE to rule out endocarditis on 10/21/17  Code Status : Full  Disposition Plan  : TBD  Microbiology- blood cultures from 10/15/2017 with gram-positive cocci and gram variable rods, culture was reintubated for better growth, final ID and sensitivity pending   Antibiotics-- IV cefepime since 10/15/2017 Received IV vancomycin on 10/15/2017 Received Flagyl on 10/15/2017  Consults  :  N/a  DVT Prophylaxis  :   SCDs   Lab Results  Component Value Date   PLT 103 (L) 10/18/2017   Inpatient Medications  Scheduled Meds: . carvedilol  25 mg Oral BID WC  . escitalopram  10 mg Oral Daily  . folic acid  1 mg Oral Daily  . lisinopril  10 mg Oral Daily  . multivitamin with minerals  1 tablet Oral Daily  . nicotine  21 mg Transdermal Daily  . pantoprazole  40 mg Oral BID  . thiamine  100 mg Oral Daily   Or  . thiamine  100 mg Intravenous Daily   Continuous Infusions: . sodium chloride 75 mL/hr at 10/19/17 0555  . ceFEPime (MAXIPIME) IV 2 g (10/19/17 1037)   PRN Meds:.acetaminophen **OR** acetaminophen, alum & mag hydroxide-simeth, ipratropium-albuterol, labetalol, LORazepam **FOLLOWED BY** [START ON 10/22/2017] LORazepam, ondansetron **OR** ondansetron (ZOFRAN) IV    Anti-infectives (From admission, onward)   Start     Dose/Rate Route Frequency Ordered Stop   10/17/17 1600  ceFEPIme (MAXIPIME) 2 g in sodium chloride 0.9 % 100 mL IVPB     2 g 200 mL/hr over 30 Minutes Intravenous Every 8 hours 10/17/17 1206     10/17/17 0830  cefTRIAXone (ROCEPHIN) 2 g in sodium chloride 0.9 % 100 mL IVPB  Status:  Discontinued     2 g 200 mL/hr over 30 Minutes Intravenous Every 24 hours 10/17/17 0822 10/17/17 1206   10/16/17 0600  vancomycin (VANCOCIN) IVPB 1000  mg/200 mL premix  Status:  Discontinued     1,000 mg 200 mL/hr over 60 Minutes Intravenous Every 12 hours 10/15/17 1624 10/16/17 1027   10/16/17 0100  ceFEPIme (MAXIPIME) 2 g in sodium chloride 0.9 % 100 mL IVPB  Status:  Discontinued     2 g 200 mL/hr over 30 Minutes Intravenous Every 8 hours 10/15/17 1622 10/17/17 0821   10/15/17 1800  vancomycin (VANCOCIN) IVPB 1000 mg/200 mL premix     1,000 mg 200 mL/hr over 60 Minutes Intravenous  Once 10/15/17 1618 10/15/17 1954   10/15/17 1700  vancomycin (VANCOCIN) IVPB 1000 mg/200 mL premix     1,000 mg 200 mL/hr over 60 Minutes Intravenous  Once 10/15/17 1618 10/15/17 1849   10/15/17 1615  ceFEPIme (MAXIPIME) 2 g in sodium chloride 0.9 % 100 mL IVPB     2 g 200 mL/hr over 30 Minutes Intravenous  Once 10/15/17 1615 10/15/17 1749   10/15/17 1615  metroNIDAZOLE (FLAGYL) IVPB 500 mg  Status:  Discontinued     500 mg 100 mL/hr over 60 Minutes Intravenous Every 8 hours 10/15/17 1615 10/15/17 2057   10/15/17 1615  vancomycin (VANCOCIN) IVPB 1000 mg/200 mL premix  Status:  Discontinued     1,000 mg 200  mL/hr over 60 Minutes Intravenous  Once 10/15/17 1615 10/15/17 1618        Objective:   Vitals:   10/18/17 1322 10/18/17 2346 10/19/17 0551 10/19/17 1200  BP: 139/89 (!) 142/99 (!) 144/107 (!) 130/96  Pulse: 86 80 79 94  Resp: 17 18 (!) 21   Temp: 98.4 F (36.9 C) 98.1 F (36.7 C) 98.4 F (36.9 C)   TempSrc: Oral Oral Oral   SpO2: 99% 100% 100% 98%  Weight:      Height:        Wt Readings from Last 3 Encounters:  10/17/17 104.8 kg  08/31/16 119.4 kg  12/27/15 134.7 kg    Intake/Output Summary (Last 24 hours) at 10/19/2017 1308 Last data filed at 10/19/2017 1001 Gross per 24 hour  Intake 3794.97 ml  Output -  Net 3794.97 ml   Physical Exam Patient is examined daily including today on 10/19/17 , exams remain the same as of yesterday except that has changed   Gen:- Awake Alert, less anxious, cooperative HEENT:- [place].AT, No sclera  icterus Neck-Supple Neck,No JVD,.  Lungs-  CTAB , no wheezing CV- S1, S2 normal, regular  abd-  +ve B.Sounds, Abd Soft, No tenderness,    Extremity/Skin:- No  edema,   good pulses Psych-affect is appropriate, oriented x3 Neuro-no new focal deficits, improving tremors   Data Review:   Micro Results Recent Results (from the past 240 hour(s))  Culture, blood (routine x 2)     Status: None (Preliminary result)   Collection Time: 10/15/17  4:46 PM  Result Value Ref Range Status   Specimen Description   Final    BLOOD LEFT FOREARM DRAWN BY RN Performed at [facility], [address] [place], [place] [address]    Special Requests   Final    BOTTLES DRAWN AEROBIC AND ANAEROBIC Blood Culture adequate volume Performed at [facility], [address], [place], [place] [address]    Culture  Setup Time   Final    GRAM POSITIVE COCCI Gram Stain Report Called to,Read Back By and Verified With: DAVIS,K ON 10/16/17 AT 1930BY LOY,C PERFORMED AT [facility] CRITICAL RESULT CALLED TO, READ BACK BY AND VERIFIED WITH: K DAVIS RN 0981 10/16/17 A BROWNING GRAM VARIABLE ROD CRITICAL RESULT CALLED TO, READ BACK BY AND VERIFIED WITH: COFFI PHARMD AT 0730 ON [id number] BY SJW    Culture   Final    CULTURE REINCUBATED FOR BETTER GROWTH Performed at [facility], [address], [place], [place] [address]    Report Status PENDING  Incomplete  Blood Culture ID Panel (Reflexed)     Status: None   Collection Time: 10/15/17  4:46 PM  Result Value Ref Range Status   Enterococcus species NOT DETECTED NOT DETECTED Final   Listeria monocytogenes NOT DETECTED NOT DETECTED Final   Staphylococcus species NOT DETECTED NOT DETECTED Final   Staphylococcus aureus NOT DETECTED NOT DETECTED Final   Streptococcus species NOT DETECTED NOT DETECTED Final   Streptococcus agalactiae NOT DETECTED NOT DETECTED Final   Streptococcus pneumoniae NOT DETECTED NOT DETECTED Final   Streptococcus pyogenes NOT DETECTED NOT DETECTED Final     Acinetobacter baumannii NOT DETECTED NOT DETECTED Final   Enterobacteriaceae species NOT DETECTED NOT DETECTED Final   Enterobacter cloacae complex NOT DETECTED NOT DETECTED Final   Escherichia coli NOT DETECTED NOT DETECTED Final   Klebsiella oxytoca NOT DETECTED NOT DETECTED Final   Klebsiella pneumoniae NOT DETECTED NOT DETECTED Final   Proteus species NOT  DETECTED NOT DETECTED Final   Serratia marcescens NOT DETECTED NOT DETECTED Final   Haemophilus influenzae NOT DETECTED NOT DETECTED Final   Neisseria meningitidis NOT DETECTED NOT DETECTED Final   Pseudomonas aeruginosa NOT DETECTED NOT DETECTED Final   Candida albicans NOT DETECTED NOT DETECTED Final   Candida glabrata NOT DETECTED NOT DETECTED Final   Candida krusei NOT DETECTED NOT DETECTED Final   Candida parapsilosis NOT DETECTED NOT DETECTED Final   Candida tropicalis NOT DETECTED NOT DETECTED Final    Comment: Performed at [facility], [address] [place], [place] [address]  Culture, blood (routine x 2)     Status: None (Preliminary result)   Collection Time: 10/15/17  4:55 PM  Result Value Ref Range Status   Specimen Description BLOOD RIGHT HAND DRAWN BY RN  Final   Special Requests   Final    BOTTLES DRAWN AEROBIC AND ANAEROBIC Blood Culture results may not be optimal due to an excessive volume of blood received in culture bottles   Culture   Final    NO GROWTH 4 DAYS Performed at [facility], [address] [place], [place] [address]    Report Status PENDING  Incomplete  Urine culture     Status: None   Collection Time: 10/15/17  5:15 PM  Result Value Ref Range Status   Specimen Description   Final    URINE, CLEAN CATCH Performed at [facility], [address] [place], [place] [address]    Special Requests   Final    NONE Performed at [facility], [address], [place], [place] [address]    Culture   Final    NO GROWTH Performed at [facility], [address], [place], [place]  [address]    Report Status 10/16/2017 FINAL  Final  MRSA PCR Screening     Status: None   Collection Time: 10/15/17  8:12 PM  Result Value Ref Range Status   MRSA by PCR NEGATIVE NEGATIVE Final    Comment:        The GeneXpert MRSA Assay (FDA approved for NASAL specimens only), is one component of a comprehensive MRSA colonization surveillance program. It is not intended to diagnose MRSA infection nor to guide or monitor treatment for MRSA infections. Performed at [facility], [address] [place], [place] [address]    Radiology Reports Dg Chest 2 View  Result Date: 10/15/2017 CLINICAL DATA:  Vomiting.  Alcohol abuse. EXAM: CHEST - 2 VIEW COMPARISON:  09/12/2012 FINDINGS: The cardiac silhouette remains borderline enlarged. The posterior costophrenic angles were excluded on the lateral radiograph. No airspace consolidation, edema, sizable pleural effusion, or pneumothorax is identified. No acute osseous abnormality is seen. IMPRESSION: No active cardiopulmonary disease. Electronically Signed   By: Sebastian Ache M.D.   On: 10/15/2017 17:06   Ct Head Wo Contrast  Result Date: 10/15/2017 CLINICAL DATA:  Altered mental status with vomiting. EXAM: CT HEAD WITHOUT CONTRAST TECHNIQUE: Contiguous axial images were obtained from the base of the skull through the vertex without intravenous contrast. COMPARISON:  August 20, 2015 FINDINGS: Brain: The ventricles are normal in size and configuration. There is no intracranial mass, hemorrhage, extra-axial fluid collection, or midline shift. Gray-white compartments appear normal. No evident acute infarct. Vascular: No hyperdense vessel. There is no appreciable vascular calcification. Skull: Bony calvarium appears intact. SinusesOrbits: Ethmoid air cell complex. There is mucosal thickening involving several ethmoid air cells as well more anteriorly. Other visualized paranasal sinuses are clear. Frontal  sinuses are hypoplastic. Visualized orbits appear symmetric  bilaterally. Other: Mastoid air cells are clear. IMPRESSION: Mild paranasal sinus disease.  Study otherwise unremarkable. Electronically Signed   By: Bretta Bang III M.D.   On: 10/15/2017 16:52   US Abdomen Limited  Result Date: 10/19/2017 CLINICAL DATA:  Elevated liver function tests.  Acute pancreatitis. EXAM: ULTRASOUND ABDOMEN LIMITED RIGHT UPPER QUADRANT COMPARISON:  None. FINDINGS: Gallbladder: No gallstones or wall thickening visualized. No sonographic Murphy sign noted by sonographer. Common bile duct: Diameter: 4.5 mm, normal. Liver: No focal lesion identified. Increased echogenicity of the liver parenchyma consistent with hepatic steatosis. Portal vein is patent on color Doppler imaging with normal direction of blood flow towards the liver. IMPRESSION: No acute abnormalities.  Hepatic steatosis. Electronically Signed   By: Francene Boyers M.D.   On: 10/19/2017 10:42    CBC Recent Labs  Lab 10/15/17 1459 10/16/17 0401 10/18/17 0529  WBC 7.0 4.6 4.5  HGB 14.4 12.3* 11.5*  HCT 40.4 35.9* 33.6*  PLT 50* 55* 103*  MCV 90.4 93.5 95.2  MCH 32.2 32.0 32.6  MCHC 35.6 34.3 34.2  RDW 14.4 14.7 14.7  LYMPHSABS 0.3*  --   --   MONOABS 1.1*  --   --   EOSABS 0.0  --   --   BASOSABS 0.0  --   --     Chemistries  Recent Labs  Lab 10/15/17 1459 10/16/17 0401 10/17/17 0424 10/18/17 0529  NA 128* 134* 136 137  K 3.3* 3.0* 3.2* 3.3*  CL 78* 91* 100 109  CO2 23 28 28 22   GLUCOSE 141* 119* 134* 117*  BUN 58* 44* 23* 15  CREATININE 1.83* 0.94 0.54* 0.49*  CALCIUM 9.3 8.8* 9.0 8.0*  MG 2.2  --   --   --   AST 186*  --  230* 378*  ALT 108*  --  94* 179*  ALKPHOS 114  --  81 79  BILITOT 5.1*  --  1.8* 1.1   ------------------------------------------------------------------------------------------------------------------ No results for input(s): CHOL, HDL, LDLCALC, TRIG, CHOLHDL, LDLDIRECT in the last 72 hours.  Lab Results  Component Value Date   HGBA1C 5.5 01/14/2015    ------------------------------------------------------------------------------------------------------------------ No results for input(s): TSH, T4TOTAL, T3FREE, THYROIDAB in the last 72 hours.  Invalid input(s): FREET3 ------------------------------------------------------------------------------------------------------------------ No results for input(s): VITAMINB12, FOLATE, FERRITIN, TIBC, IRON, RETICCTPCT in the last 72 hours.  Coagulation profile Recent Labs  Lab 10/15/17 1459  INR 1.06   No results for input(s): DDIMER in the last 72 hours.  Cardiac Enzymes No results for input(s): CKMB, TROPONINI, MYOGLOBIN in the last 168 hours.  Invalid input(s): CK ------------------------------------------------------------------------------------------------------------------ No results found for: BNP   Shon Hale M.D on 10/19/2017 at 1:08 PM  Pager---[phone] Go to [email or web address] - password [email or web address] for contact info  [facility] - Office  [phone]

## 2017-10-20 DIAGNOSIS — R7881 Bacteremia: Secondary | ICD-10-CM | POA: Diagnosis present

## 2017-10-20 LAB — CULTURE, BLOOD (ROUTINE X 2): Culture: NO GROWTH

## 2017-10-20 NOTE — Progress Notes (Signed)
Patient Demographics:    Malik Edwards, is a 42 y.o. male, DOB - 05-07-1975, WUJ:[id number]  Admit date - 10/15/2017   Admitting Physician Ejiroghene Wendall Stade, MD  Outpatient Primary MD for the patient is Patient, No Pcp Per  LOS - 5   Chief Complaint  Patient presents with  . 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 In patient with bacteremia, cannot exclude vegetation. Consider TEE for further evaluation if clinically indicated.  Doppler:  There was trivial regurgitation.  Brief Summary 42 y.o. male with medical history significant for alcohol and tobacco abuse, depression, OSA, paroxysmal atrial flutter admitted on 10/15/2017 with persistent emesis elevated lipase and concerns about alcohol withdrawal. Blood cultures from 10/15/2017 with possible micrococcus luteus and unclaimed Moraxella, ???  If Contaminant. TTE with possible vegetation, unable to rule out endocarditis with TTE, cardiologist recommend TEE, hopefully TEE can be done on  10/21/2017    Plan:- 1)HFrEF--echo with EF of 30 to 35% (EF is down from 40 % ), patient with chronic systolic dysfunction CHF without acute exacerbation at this time, continue lisinopril 10 mg daily and Coreg 25 mg twice daily, cardiology consult on  10/21/2017  2))Bacteremia----afebrile without leukocytosis, Blood cultures from 10/15/2017 with possible micrococcus luteus and unclaimed Moraxella, ???  If Contaminant. TTE with possible vegetation,  ???  unable to rule out endocarditis with TTE, cardiologist recommend TEE, hopefully TEE can be done on  10/21/2017 ,   cefepime discontinued on 10/19/2017, started on IV Rocephin on 10/19/2017 d/w Dr Luciana Axe (from Infectious Disease) , he advised stopping Rocephin 10/21/17 (total of 7 days of antibiotics), as per infectious disease physician this organisms are unlikely to cause endocarditis. chest x-ray on admission without acute findings, patient remains afebrile without leukocytosis  3)Alcohol Withdrawal/DTs----overall DT symptoms appears to be mostly resolved, last alcoholic intake 10/14/2017, binge drinking heavily for  couple of weeks prior to admission, completed Valium detox protocol,, continue IV Lorazepam per [facility] protocol, continue multivitamin/folic acid/thiamine,,   4)AKI----acute kidney injury , prerenal due to dehydration in the setting of persistent emesis and decreased oral intake, AKI resolved with hydration,   creatinine on admission= 1.83, baseline creatinine = 0.73, creatinine is now= normalized, renally adjust medications, avoid nephrotoxic agents/dehydration/hypotension, restart lisinopril 10 mg   5)H/o PAFlutter--- appears stable at this time, patient appears to be in sinus rhythm at this time, c/n Coreg 25 mg bid , probably not a good candidate for anticoagulation given significant alcohol abuse and thrombocytopenia,  CHA2DS2- VASc score   is = 2 (CHF and HTN)    Which is  equal to =  2.2% annual risk of stroke  , consider aspirin if platelet count recovers   6)Thrombocytopenia and Elevated LFTs--- most likely due to alcohol abuse, AST to ALT ratio is almost 2:1, platelet count is trending up after cessation of alcohol use , no bleeding concerns,viral  hepatitis profile neg  7)HTN--- BP has  improved , c/n  carvedilol 25 mg bid and  may use IV labetalol when necessary  Every 4 hours for systolic blood pressure over 160 mmhg  8)FEN--hyponatremia/hypokalemia----low sodium most likely due to [employer] Potomania/alcohol abuse, sodium is improving now up to 134 from 128 on admission, replace potassium,  magnesium is normal  9)Acute Pancreatitis--- no further emesis, some nausea persist, , continue Protonix, , lipase was 205 on admission, tolerating GI soft diet well  10)Obesity/OSA--CPAP nightly, obesity complicates overall care  11)Mood Disorder--- denies SI/HI, agreeable to trial of Lexapro  12)Alcoholic Hepatitis/fatty liver/elevated LFTs--abdominal ultrasound without acute findings, AST higher than ALT patient appears to have hepatic steatosis in the setting of alcohol abuse, avoid hepatotoxic agents   Disposition/Need for in-Hospital Stay- patient unable to be discharged at this time due to gram-positive cocci and GVR in his blood/bacteremia, awaiting final ID and sensitivity of blood cultures in the meantime patient continues to need IV antibiotics, may need TEE to rule out endocarditis on 10/21/17 pending cardiology consult  Code Status : Full  Disposition Plan  : TBD  Microbiology- blood cultures from 10/15/2017 Blood cultures from 10/15/2017 with possible micrococcus luteus and unclaimed Moraxella, final ID and sensitivity pending   Antibiotics-- IV cefepime  10/15/2017 thru 10/19/17 Received IV vancomycin on 10/15/2017 Received Flagyl on 10/15/2017 Rocephin on 10/19/17  Consults  :  N/a  DVT Prophylaxis  :   SCDs   Lab Results  Component Value Date   PLT 103 (L) 10/18/2017   Inpatient Medications  Scheduled Meds: . carvedilol  25 mg Oral BID WC  . escitalopram  10 mg Oral Daily  . folic acid  1 mg Oral Daily  . lisinopril  10 mg Oral Daily  . multivitamin with minerals  1 tablet Oral Daily  . nicotine  21 mg Transdermal Daily  . pantoprazole  40 mg Oral BID  .  thiamine  100 mg Oral Daily   Or  . thiamine  100 mg Intravenous Daily   Continuous Infusions: . cefTRIAXone (ROCEPHIN)  IV Stopped (10/19/17 1700)   PRN Meds:.acetaminophen **OR** acetaminophen, alum & mag hydroxide-simeth, ipratropium-albuterol, labetalol, LORazepam **FOLLOWED BY** [START ON 10/22/2017] LORazepam, ondansetron **OR** ondansetron (ZOFRAN) IV    Anti-infectives (From admission, onward)   Start     Dose/Rate Route Frequency Ordered Stop   10/19/17 1600  cefTRIAXone (ROCEPHIN) 2 g in sodium chloride 0.9 % 100 mL IVPB     2 g 200 mL/hr over 30 Minutes Intravenous Every 24 hours 10/19/17 1341     10/17/17 1600  ceFEPIme (MAXIPIME) 2 g in sodium chloride 0.9 % 100 mL IVPB  Status:  Discontinued     2 g 200 mL/hr over 30 Minutes Intravenous Every 8 hours 10/17/17 1206 10/19/17 1341   10/17/17 0830  cefTRIAXone (ROCEPHIN) 2 g in sodium chloride 0.9 % 100 mL IVPB  Status:  Discontinued     2 g 200 mL/hr over 30 Minutes Intravenous Every 24 hours 10/17/17 0822 10/17/17 1206   10/16/17 0600  vancomycin (VANCOCIN) IVPB 1000 mg/200 mL premix  Status:  Discontinued     1,000 mg 200 mL/hr over 60 Minutes Intravenous Every 12 hours 10/15/17  1624 10/16/17 1027   10/16/17 0100  ceFEPIme (MAXIPIME) 2 g in sodium chloride 0.9 % 100 mL IVPB  Status:  Discontinued     2 g 200 mL/hr over 30 Minutes Intravenous Every 8 hours 10/15/17 1622 10/17/17 0821   10/15/17 1800  vancomycin (VANCOCIN) IVPB 1000 mg/200 mL premix     1,000 mg 200 mL/hr over 60 Minutes Intravenous  Once 10/15/17 1618 10/15/17 1954   10/15/17 1700  vancomycin (VANCOCIN) IVPB 1000 mg/200 mL premix     1,000 mg 200 mL/hr over 60 Minutes Intravenous  Once 10/15/17 1618 10/15/17 1849   10/15/17 1615  ceFEPIme (MAXIPIME) 2 g in sodium chloride 0.9 % 100 mL IVPB     2 g 200 mL/hr over 30 Minutes Intravenous  Once 10/15/17 1615 10/15/17 1749   10/15/17 1615  metroNIDAZOLE (FLAGYL) IVPB 500 mg  Status:  Discontinued     500  mg 100 mL/hr over 60 Minutes Intravenous Every 8 hours 10/15/17 1615 10/15/17 2057   10/15/17 1615  vancomycin (VANCOCIN) IVPB 1000 mg/200 mL premix  Status:  Discontinued     1,000 mg 200 mL/hr over 60 Minutes Intravenous  Once 10/15/17 1615 10/15/17 1618        Objective:   Vitals:   10/19/17 1200 10/19/17 1528 10/19/17 1800 10/20/17 0609  BP: (!) 130/96 119/87 (!) 134/93 (!) 136/94  Pulse: 94 83 81 84  Resp:  18 20 18   Temp:  98.3 F (36.8 C) 98.2 F (36.8 C) 98.6 F (37 C)  TempSrc:  Oral Oral Oral  SpO2: 98% 97% 97% 100%  Weight:      Height:        Wt Readings from Last 3 Encounters:  10/17/17 104.8 kg  08/31/16 119.4 kg  12/27/15 134.7 kg    Intake/Output Summary (Last 24 hours) at 10/20/2017 1610 Last data filed at 10/20/2017 9604 Gross per 24 hour  Intake 2807.4 ml  Output -  Net 2807.4 ml   Physical Exam Patient is examined daily including today on 10/20/17 , exams remain the same as of yesterday except that has changed   Gen:- Awake Alert, less anxious, cooperative HEENT:- [place].AT, No sclera icterus Neck-Supple Neck,No JVD,.  Lungs-  CTAB , no wheezing CV- S1, S2 normal, regular  abd-  +ve B.Sounds, Abd Soft, No tenderness,    Extremity/Skin:- No  edema,   good pulses Psych-affect is appropriate, oriented x3 Neuro-no new focal deficits, improving tremors   Data Review:   Micro Results Recent Results (from the past 240 hour(s))  Culture, blood (routine x 2)     Status: Abnormal (Preliminary result)   Collection Time: 10/15/17  4:46 PM  Result Value Ref Range Status   Specimen Description   Final    BLOOD LEFT FOREARM DRAWN BY RN Performed at [facility], [address], [place], [place] [address]    Special Requests   Final    BOTTLES DRAWN AEROBIC AND ANAEROBIC Blood Culture adequate volume Performed at [facility], [address] [place], [place] [address]    Culture  Setup Time   Final    GRAM POSITIVE COCCI Gram Stain Report Called to,Read  Back By and Verified With: DAVIS,K ON 10/16/17 AT 1930BY LOY,C PERFORMED AT [facility] CRITICAL RESULT CALLED TO, READ BACK BY AND VERIFIED WITH: K DAVIS RN [id number] 10/16/17 A BROWNING GRAM VARIABLE ROD CRITICAL RESULT CALLED TO, READ BACK BY AND VERIFIED WITH: COFFI PHARMD AT 0730 ON [id number] BY SJW  Culture (A)  Final    MICROCOCCUS SPECIES Standardized susceptibility testing for this organism is not available. Performed at [facility], [address], [place], [place] [address]    Report Status PENDING  Incomplete  Blood Culture ID Panel (Reflexed)     Status: None   Collection Time: 10/15/17  4:46 PM  Result Value Ref Range Status   Enterococcus species NOT DETECTED NOT DETECTED Final   Listeria monocytogenes NOT DETECTED NOT DETECTED Final   Staphylococcus species NOT DETECTED NOT DETECTED Final   Staphylococcus aureus NOT DETECTED NOT DETECTED Final   Streptococcus species NOT DETECTED NOT DETECTED Final   Streptococcus agalactiae NOT DETECTED NOT DETECTED Final   Streptococcus pneumoniae NOT DETECTED NOT DETECTED Final   Streptococcus pyogenes NOT DETECTED NOT DETECTED Final   Acinetobacter baumannii NOT DETECTED NOT DETECTED Final   Enterobacteriaceae species NOT DETECTED NOT DETECTED Final   Enterobacter cloacae complex NOT DETECTED NOT DETECTED Final   Escherichia coli NOT DETECTED NOT DETECTED Final   Klebsiella oxytoca NOT DETECTED NOT DETECTED Final   Klebsiella pneumoniae NOT DETECTED NOT DETECTED Final   Proteus species NOT DETECTED NOT DETECTED Final   Serratia marcescens NOT DETECTED NOT DETECTED Final   Haemophilus influenzae NOT DETECTED NOT DETECTED Final   Neisseria meningitidis NOT DETECTED NOT DETECTED Final   Pseudomonas aeruginosa NOT DETECTED NOT DETECTED Final   Candida albicans NOT DETECTED NOT DETECTED Final   Candida glabrata NOT DETECTED NOT DETECTED Final   Candida krusei NOT DETECTED NOT DETECTED Final   Candida parapsilosis NOT DETECTED NOT DETECTED  Final   Candida tropicalis NOT DETECTED NOT DETECTED Final    Comment: Performed at [facility], [address], [place], [place] [address]  Culture, blood (routine x 2)     Status: None (Preliminary result)   Collection Time: 10/15/17  4:55 PM  Result Value Ref Range Status   Specimen Description BLOOD RIGHT HAND DRAWN BY RN  Final   Special Requests   Final    BOTTLES DRAWN AEROBIC AND ANAEROBIC Blood Culture results may not be optimal due to an excessive volume of blood received in culture bottles   Culture   Final    NO GROWTH 4 DAYS Performed at [facility], [address] [place], [place] [address]    Report Status PENDING  Incomplete  Urine culture     Status: None   Collection Time: 10/15/17  5:15 PM  Result Value Ref Range Status   Specimen Description   Final    URINE, CLEAN CATCH Performed at [facility], [address] [place], [place] [address]    Special Requests   Final    NONE Performed at [facility], [address], [place], [place] [address]    Culture   Final    NO GROWTH Performed at [facility], [address] [place], [place] [address]    Report Status 10/16/2017 FINAL  Final  MRSA PCR Screening     Status: None   Collection Time: 10/15/17  8:12 PM  Result Value Ref Range Status   MRSA by PCR NEGATIVE NEGATIVE Final    Comment:        The GeneXpert MRSA Assay (FDA approved for NASAL specimens only), is one component of a comprehensive MRSA colonization surveillance program. It is not intended to diagnose MRSA infection nor to guide or monitor treatment for MRSA infections. Performed at [facility], [address], [place], [place] [address]  Radiology Reports Dg Chest 2 View  Result Date: 10/15/2017 CLINICAL DATA:  Vomiting.  Alcohol abuse. EXAM: CHEST - 2 VIEW COMPARISON:  09/12/2012 FINDINGS: The cardiac silhouette remains borderline enlarged. The posterior costophrenic angles were excluded on the lateral radiograph. No  airspace consolidation, edema, sizable pleural effusion, or pneumothorax is identified. No acute osseous abnormality is seen. IMPRESSION: No active cardiopulmonary disease. Electronically Signed   By: Sebastian Ache M.D.   On: 10/15/2017 17:06   Ct Head Wo Contrast  Result Date: 10/15/2017 CLINICAL DATA:  Altered mental status with vomiting. EXAM: CT HEAD WITHOUT CONTRAST TECHNIQUE: Contiguous axial images were obtained from the base of the skull through the vertex without intravenous contrast. COMPARISON:  August 20, 2015 FINDINGS: Brain: The ventricles are normal in size and configuration. There is no intracranial mass, hemorrhage, extra-axial fluid collection, or midline shift. Gray-white compartments appear normal. No evident acute infarct. Vascular: No hyperdense vessel. There is no appreciable vascular calcification. Skull: Bony calvarium appears intact. SinusesOrbits: Ethmoid air cell complex. There is mucosal thickening involving several ethmoid air cells as well more anteriorly. Other visualized paranasal sinuses are clear. Frontal sinuses are hypoplastic. Visualized orbits appear symmetric bilaterally. Other: Mastoid air cells are clear. IMPRESSION: Mild paranasal sinus disease.  Study otherwise unremarkable. Electronically Signed   By: Bretta Bang III M.D.   On: 10/15/2017 16:52   US Abdomen Limited  Result Date: 10/19/2017 CLINICAL DATA:  Elevated liver function tests.  Acute pancreatitis. EXAM: ULTRASOUND ABDOMEN LIMITED RIGHT UPPER QUADRANT COMPARISON:  None. FINDINGS: Gallbladder: No gallstones or wall thickening visualized. No sonographic Murphy sign noted by sonographer. Common bile duct: Diameter: 4.5 mm, normal. Liver: No focal lesion identified. Increased echogenicity of the liver parenchyma consistent with hepatic steatosis. Portal vein is patent on color Doppler imaging with normal direction of blood flow towards the liver. IMPRESSION: No acute abnormalities.  Hepatic steatosis.  Electronically Signed   By: Francene Boyers M.D.   On: 10/19/2017 10:42    CBC Recent Labs  Lab 10/15/17 1459 10/16/17 0401 10/18/17 0529  WBC 7.0 4.6 4.5  HGB 14.4 12.3* 11.5*  HCT 40.4 35.9* 33.6*  PLT 50* 55* 103*  MCV 90.4 93.5 95.2  MCH 32.2 32.0 32.6  MCHC 35.6 34.3 34.2  RDW 14.4 14.7 14.7  LYMPHSABS 0.3*  --   --   MONOABS 1.1*  --   --   EOSABS 0.0  --   --   BASOSABS 0.0  --   --     Chemistries  Recent Labs  Lab 10/15/17 1459 10/16/17 0401 10/17/17 0424 10/18/17 0529  NA 128* 134* 136 137  K 3.3* 3.0* 3.2* 3.3*  CL 78* 91* 100 109  CO2 23 28 28 22   GLUCOSE 141* 119* 134* 117*  BUN 58* 44* 23* 15  CREATININE 1.83* 0.94 0.54* 0.49*  CALCIUM 9.3 8.8* 9.0 8.0*  MG 2.2  --   --   --   AST 186*  --  230* 378*  ALT 108*  --  94* 179*  ALKPHOS 114  --  81 79  BILITOT 5.1*  --  1.8* 1.1   ------------------------------------------------------------------------------------------------------------------ No results for input(s): CHOL, HDL, LDLCALC, TRIG, CHOLHDL, LDLDIRECT in the last 72 hours.  Lab Results  Component Value Date   HGBA1C 5.5 01/14/2015   ------------------------------------------------------------------------------------------------------------------ No results for input(s): TSH, T4TOTAL, T3FREE, THYROIDAB in the last 72 hours.  Invalid input(s): FREET3 ------------------------------------------------------------------------------------------------------------------ No results for input(s): VITAMINB12, FOLATE, FERRITIN, TIBC, IRON, RETICCTPCT in the  last 72 hours.  Coagulation profile Recent Labs  Lab 10/15/17 1459  INR 1.06   No results for input(s): DDIMER in the last 72 hours.  Cardiac Enzymes No results for input(s): CKMB, TROPONINI, MYOGLOBIN in the last 168 hours.  Invalid input(s): CK ------------------------------------------------------------------------------------------------------------------ No results found for:  BNP   Shon Hale M.D on 10/20/2017 at 8:08 AM  Pager---[phone] Go to [email or web address] - password [email or web address] for contact info  [facility] - Office  [phone]

## 2017-10-21 DIAGNOSIS — R7881 Bacteremia: Secondary | ICD-10-CM

## 2017-10-21 DIAGNOSIS — I5021 Acute systolic (congestive) heart failure: Secondary | ICD-10-CM

## 2017-10-21 DIAGNOSIS — I429 Cardiomyopathy, unspecified: Secondary | ICD-10-CM

## 2017-10-21 DIAGNOSIS — F1010 Alcohol abuse, uncomplicated: Secondary | ICD-10-CM

## 2017-10-21 DIAGNOSIS — I10 Essential (primary) hypertension: Secondary | ICD-10-CM

## 2017-10-21 DIAGNOSIS — N179 Acute kidney failure, unspecified: Secondary | ICD-10-CM

## 2017-10-21 DIAGNOSIS — Z720 Tobacco use: Secondary | ICD-10-CM

## 2017-10-21 DIAGNOSIS — F332 Major depressive disorder, recurrent severe without psychotic features: Secondary | ICD-10-CM

## 2017-10-21 DIAGNOSIS — I4892 Unspecified atrial flutter: Secondary | ICD-10-CM

## 2017-10-21 MED ORDER — NICOTINE 21 MG/24HR TD PT24: 21.0000 mg | MEDICATED_PATCH | Freq: Every day | TRANSDERMAL | 1 refills | Status: DC

## 2017-10-21 MED ORDER — LOSARTAN POTASSIUM 25 MG PO TABS: 25.0000 mg | ORAL_TABLET | Freq: Every day | ORAL | 3 refills | Status: DC

## 2017-10-21 MED ORDER — FOLIC ACID 1 MG PO TABS: 1.0000 mg | ORAL_TABLET | Freq: Every day | ORAL | 1 refills | Status: DC

## 2017-10-21 MED ORDER — PANTOPRAZOLE SODIUM 40 MG PO TBEC: 40.0000 mg | DELAYED_RELEASE_TABLET | Freq: Every day | ORAL | 1 refills | Status: DC

## 2017-10-21 NOTE — Progress Notes (Signed)
Discharge instructions were reviewed and discussed with patient. All questions were answered and no further questions at this time. Pt in stable condition and in no acute distress at time of discharge. Pt escorted by nurse tech.

## 2017-10-21 NOTE — Clinical Social Work Note (Signed)
Clinical Social Work Assessment  Patient Details  Name: Malik Edwards MRN: [id number] Date of Birth: January 01, 1976  Date of referral:  10/21/17               Reason for consult:  Substance Use/ETOH Abuse                Permission sought to share information with:    Permission granted to share information::     Name::        Agency::     Relationship::     Contact Information:     Housing/Transportation Living arrangements for the past 2 months:  Single Family Home Source of Information:  Patient Patient Interpreter Needed:  None Criminal Activity/Legal Involvement Pertinent to Current Situation/Hospitalization:  No - Comment as needed Significant Relationships:  Spouse Lives with:  Spouse Do you feel safe going back to the place where you live?  Yes Need for family participation in patient care:  No (Coment)  Care giving concerns: Pt is independent in ADLs at baseline.   Social Worker assessment / plan: Pt is a 42 year old male referred to [place] [facility] for resources on AODA treatment. Pt also reportedly had questions for CSW. Met with pt at bedside. Pt alert and oriented x4. Pt had questions about Disability (Social Security and Medicaid). Provided verbal and written information on both programs and where to apply. Also discussed ETOH treatment options with pt who states he has the AA app on his phone so he is familiar with that option. Provided information on [employer] and other outpatient mental health counseling options. Pt expressed desire to follow up. He did not have any other CSW questions/concerns at this time.  Employment status:  Unemployed Forensic scientist:  Self Pay (Medicaid Pending) PT Recommendations:  Not assessed at this time Information / Referral to community resources:  Outpatient Substance Abuse Treatment Options, Other (Comment Required)([facility])  Patient/Family's Response to care: Pt accepting of care.  Patient/Family's Understanding of  and Emotional Response to Diagnosis, Current Treatment, and Prognosis: Pt appears to have understanding of diagnosis and treatment recommendations. No emotional distress identified.  Emotional Assessment Appearance:  Appears stated age Attitude/Demeanor/Rapport:  Engaged Affect (typically observed):  Calm, Flat, Quiet Orientation:  Oriented to Self, Oriented to Place, Oriented to  Time, Oriented to Situation Alcohol / Substance use:  Alcohol Use Psych involvement (Current and /or in the community):  No (Comment)  Discharge Needs  Concerns to be addressed:  Substance Abuse Concerns, Financial / Insurance Concerns Readmission within the last 30 days:  No Current discharge risk:  Substance Abuse Barriers to Discharge:  No Barriers Identified   Shade Flood, LCSW 10/21/2017, 11:07 AM

## 2017-10-21 NOTE — Discharge Summary (Signed)
Physician Discharge Summary  Malik Edwards WJX:[id number] DOB: [birth year] DOA: 10/15/2017  PCP: Patient, No Pcp Per  Admit date: 10/15/2017 Discharge date: 10/21/2017  Time spent: 35 minutes  Recommendations for Outpatient Follow-up:  Reassess BP and adjust antihypertensive regimen as needed Check patient's volume status and adjust diuretic regimen as needed Assist patient as needed with tobacco and alcohol abuse Repeat BMET to follow electrolytes and renal function.  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 Discharge home with instructions to follow up with cardiology service and to establish care with PCP.  Diet recommendation: heart healthy diet   Filed Weights   10/15/17 2014 10/16/17 0500 10/17/17 0500  Weight: 99.4 kg 99.4 kg 104.8 kg    History of present illness:  As per H&P written by Dr. Mariea Clonts on 10/15/17  42 y.o. male with medical history significant for alcohol and tobacco abuse, depression, OSA, paroxysmal atrial flutter.  Patient presented to the ED with reports of multiple episodes of nonbloody vomiting.  Emesis sometimes dark, sometimes clear.  Patient has been binge drinking over the past 2 weeks, drinks at least half a gallon of vodka daily.  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  Last drink was 24 hours ago, he stopped drinking when he started vomiting.  Patient reports he has barely eaten in 1 week. He denies abdominal pain or distention, no loose stools.  Patient  denies actual coughing after vomiting, does not think he aspirated, both admits to several episodes of loss of consciousness/passing out while binge drinking over the past 2 weeks.  He denies dysuria or frequency.  No neck pain, or facial pain no headaches.  History of alcohol withdrawal, without seizures.   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 -case dicussed with cardiology service; planning with medication management and outpatient follow up to repeat Echo -advised to stop alcohol. -educated about Heart Healthy/Low Sodium diet. -discharge on carvedilol and losartan -no fluid overload, no diuretics prescribed at discharge.  2-Bacteremia to Micrococcus Luteus and Moraxella  -treated with 7 days of Rocephin -per ID no further abx's needed and not need for TEE to rule out endocarditis. -patient afebrile and with normal WBC's.  3-alcohol abuse: -no major withdrawal appreciated -patient kept on CIWA protocol -cessation counseling provided -resources for outpatient assistance provided by social worker  -discharge on multivitamin to supplement folic acid and thiamine.  4-GERD -continue PPI  5-tobacco abuse -cessation counseling provided -discharge with nicotine patch prescription  6-depression/schizophrenia -no SI or hallucinations -continue Vybrid   Procedures:  See below for x-ray reports  2-D echo  Consultations:  Cardiology  ID  Discharge Exam: Vitals:   10/21/17 1039 10/21/17 1244  BP: (!) 153/97 126/82  Pulse: 89 85  Resp:  17  Temp:  97.9 F (36.6 C)  SpO2:  97%    Discharge Instructions   Discharge Instructions    (HEART  FAILURE PATIENTS) Call MD:  Anytime you have any of the following symptoms: 1) 3 pound weight gain in 24 hours or 5 pounds in 1 week 2) shortness of breath, with or without a  dry hacking cough 3) swelling in the hands, feet or stomach 4) if you have to sleep on extra pillows at night in order to breathe.   Complete by:  As directed    Diet - low sodium heart healthy   Complete by:  As directed    Discharge instructions   Complete by:  As directed    Take medications as prescribed Maintain adequate hydration Stop smoking and to stop drinking alcohol. Follow-up with cardiology service (office will contact you with appointment details) Please follow heart healthy/low sodium diet.     Allergies as of 10/21/2017      Reactions   Gabapentin Swelling   Statins    Elevated LFTs  (Lipitor specifically)      Medication List    STOP taking these medications   lisinopril 10 MG tablet Commonly known as:  PRINIVIL,ZESTRIL     TAKE these medications   albuterol 108 (90 Base) MCG/ACT inhaler Commonly known as:  PROVENTIL HFA;VENTOLIN HFA Inhale 2 puffs into the lungs every 4 (four) hours as needed for wheezing or shortness of breath (cough, shortness of breath or wheezing.).   aspirin 81 MG EC tablet Take 2 tablets (162 mg total) by mouth daily. For heart health   carvedilol 25 MG tablet Commonly known as:  COREG Take 1 tablet (25 mg total) by mouth 2 (two) times daily.   folic acid 1 MG tablet Commonly known as:  FOLVITE Take 1 tablet (1 mg total) by mouth daily.   losartan 25 MG tablet Commonly known as:  COZAAR Take 1 tablet (25 mg total) by mouth daily.   nicotine 21 mg/24hr patch Commonly known as:  NICODERM CQ - dosed in mg/24 hours Place 1 patch (21 mg total) onto the skin daily. Start taking on:  10/22/2017   pantoprazole 40 MG tablet Commonly known as:  PROTONIX Take 1 tablet (40 mg total) by mouth daily.   thiamine 100 MG tablet Take 1 tablet (100 mg total) by mouth daily.   Vilazodone HCl 40 MG Tabs Commonly known as:  VIIBRYD Take 1 tablet (40 mg total) by mouth daily with breakfast. For OCD/depression      Allergies  Allergen  Reactions  . Gabapentin Swelling  . Statins     Elevated LFTs  (Lipitor specifically)   Follow-up Information    Laqueta Linden, MD Follow up today.   Specialty:  Cardiology Why:  office will contact you with appointment details.  Contact information: [address] [place] [address] [phone]            The results of significant diagnostics from this hospitalization (including imaging, microbiology, ancillary and laboratory) are listed below for reference.    Significant Diagnostic Studies: Dg Chest 2 View  Result Date: 10/15/2017 CLINICAL DATA:  Vomiting.  Alcohol abuse. EXAM: CHEST - 2 VIEW COMPARISON:  09/12/2012 FINDINGS: The cardiac silhouette remains borderline enlarged. The posterior costophrenic angles were excluded on the lateral radiograph. No airspace consolidation, edema, sizable pleural effusion, or pneumothorax is identified. No acute osseous abnormality is seen. IMPRESSION: No active cardiopulmonary disease. Electronically Signed   By: Sebastian Ache M.D.   On: 10/15/2017 17:06   Ct Head Wo Contrast  Result Date: 10/15/2017 CLINICAL DATA:  Altered mental status with  vomiting. EXAM: CT HEAD WITHOUT CONTRAST TECHNIQUE: Contiguous axial images were obtained from the base of the skull through the vertex without intravenous contrast. COMPARISON:  August 20, 2015 FINDINGS: Brain: The ventricles are normal in size and configuration. There is no intracranial mass, hemorrhage, extra-axial fluid collection, or midline shift. Gray-white compartments appear normal. No evident acute infarct. Vascular: No hyperdense vessel. There is no appreciable vascular calcification. Skull: Bony calvarium appears intact. SinusesOrbits: Ethmoid air cell complex. There is mucosal thickening involving several ethmoid air cells as well more anteriorly. Other visualized paranasal sinuses are clear. Frontal sinuses are hypoplastic. Visualized orbits appear symmetric bilaterally. Other: Mastoid  air cells are clear. IMPRESSION: Mild paranasal sinus disease.  Study otherwise unremarkable. Electronically Signed   By: Bretta Bang III M.D.   On: 10/15/2017 16:52   US Abdomen Limited  Result Date: 10/19/2017 CLINICAL DATA:  Elevated liver function tests.  Acute pancreatitis. EXAM: ULTRASOUND ABDOMEN LIMITED RIGHT UPPER QUADRANT COMPARISON:  None. FINDINGS: Gallbladder: No gallstones or wall thickening visualized. No sonographic Murphy sign noted by sonographer. Common bile duct: Diameter: 4.5 mm, normal. Liver: No focal lesion identified. Increased echogenicity of the liver parenchyma consistent with hepatic steatosis. Portal vein is patent on color Doppler imaging with normal direction of blood flow towards the liver. IMPRESSION: No acute abnormalities.  Hepatic steatosis. Electronically Signed   By: Francene Boyers M.D.   On: 10/19/2017 10:42    Microbiology: Recent Results (from the past 240 hour(s))  Culture, blood (routine x 2)     Status: Abnormal (Preliminary result)   Collection Time: 10/15/17  4:46 PM  Result Value Ref Range Status   Specimen Description   Final    BLOOD LEFT FOREARM DRAWN BY RN Performed at [facility], [address] [place], [place] [address]    Special Requests   Final    BOTTLES DRAWN AEROBIC AND ANAEROBIC Blood Culture adequate volume Performed at [facility], [address] [place], [place] [address]    Culture  Setup Time   Final    GRAM POSITIVE COCCI Gram Stain Report Called to,Read Back By and Verified With: DAVIS,K ON 10/16/17 AT 1930BY LOY,C PERFORMED AT [facility] CRITICAL RESULT CALLED TO, READ BACK BY AND VERIFIED WITH: K DAVIS RN 2956 10/16/17 A BROWNING GRAM VARIABLE ROD CRITICAL RESULT CALLED TO, READ BACK BY AND VERIFIED WITH: COFFI PHARMD AT 0730 ON [id number] BY SJW    Culture (A)  Final    MICROCOCCUS SPECIES Standardized susceptibility testing for this organism is not available. GRAM VARIABLE ROD REFFERING TO [facility] FOR  IDENTIFICATION Performed at [facility], [address] [place], [place] [address]    Report Status PENDING  Incomplete  Blood Culture ID Panel (Reflexed)     Status: None   Collection Time: 10/15/17  4:46 PM  Result Value Ref Range Status   Enterococcus species NOT DETECTED NOT DETECTED Final   Listeria monocytogenes NOT DETECTED NOT DETECTED Final   Staphylococcus species NOT DETECTED NOT DETECTED Final   Staphylococcus aureus NOT DETECTED NOT DETECTED Final   Streptococcus species NOT DETECTED NOT DETECTED Final   Streptococcus agalactiae NOT DETECTED NOT DETECTED Final   Streptococcus pneumoniae NOT DETECTED NOT DETECTED Final   Streptococcus pyogenes NOT DETECTED NOT DETECTED Final   Acinetobacter baumannii NOT DETECTED NOT DETECTED Final   Enterobacteriaceae species NOT DETECTED NOT DETECTED Final   Enterobacter cloacae complex NOT DETECTED NOT DETECTED Final   Escherichia coli NOT DETECTED NOT DETECTED Final  Klebsiella oxytoca NOT DETECTED NOT DETECTED Final   Klebsiella pneumoniae NOT DETECTED NOT DETECTED Final   Proteus species NOT DETECTED NOT DETECTED Final   Serratia marcescens NOT DETECTED NOT DETECTED Final   Haemophilus influenzae NOT DETECTED NOT DETECTED Final   Neisseria meningitidis NOT DETECTED NOT DETECTED Final   Pseudomonas aeruginosa NOT DETECTED NOT DETECTED Final   Candida albicans NOT DETECTED NOT DETECTED Final   Candida glabrata NOT DETECTED NOT DETECTED Final   Candida krusei NOT DETECTED NOT DETECTED Final   Candida parapsilosis NOT DETECTED NOT DETECTED Final   Candida tropicalis NOT DETECTED NOT DETECTED Final    Comment: Performed at [facility], [address] [place], [place] [address]  Culture, blood (routine x 2)     Status: None   Collection Time: 10/15/17  4:55 PM  Result Value Ref Range Status   Specimen Description BLOOD RIGHT HAND DRAWN BY RN  Final   Special Requests   Final    BOTTLES DRAWN AEROBIC AND ANAEROBIC  Blood Culture results may not be optimal due to an excessive volume of blood received in culture bottles   Culture   Final    NO GROWTH 5 DAYS Performed at [facility], [address], [place], [place] [address]    Report Status 10/20/2017 FINAL  Final  Urine culture     Status: None   Collection Time: 10/15/17  5:15 PM  Result Value Ref Range Status   Specimen Description   Final    URINE, CLEAN CATCH Performed at [facility], [address] [place], [place] [address]    Special Requests   Final    NONE Performed at [facility], [address], [place], [place] [address]    Culture   Final    NO GROWTH Performed at [facility], [address], [place], [place] [address]    Report Status 10/16/2017 FINAL  Final  MRSA PCR Screening     Status: None   Collection Time: 10/15/17  8:12 PM  Result Value Ref Range Status   MRSA by PCR NEGATIVE NEGATIVE Final    Comment:        The GeneXpert MRSA Assay (FDA approved for NASAL specimens only), is one component of a comprehensive MRSA colonization surveillance program. It is not intended to diagnose MRSA infection nor to guide or monitor treatment for MRSA infections. Performed at [facility], [address], [place], [place] [address]   Culture, blood (Routine X 2) w Reflex to ID Panel     Status: None (Preliminary result)   Collection Time: 10/20/17  9:00 AM  Result Value Ref Range Status   Specimen Description   Final    LEFT ANTECUBITAL BOTTLES DRAWN AEROBIC AND ANAEROBIC   Special Requests Blood Culture adequate volume  Final   Culture   Final    NO GROWTH < 24 HOURS Performed at [facility], [address] [place], [place] [address]    Report Status PENDING  Incomplete  Culture, blood (Routine X 2) w Reflex to ID Panel     Status: None (Preliminary result)   Collection Time: 10/20/17  9:00 AM  Result Value Ref Range Status   Specimen Description   Final    BLOOD LEFT HAND BOTTLES DRAWN AEROBIC AND ANAEROBIC    Special Requests Blood Culture adequate volume  Final   Culture   Final    NO GROWTH < 24 HOURS Performed at [facility], [address], [place], [place] [address]    Report Status PENDING  Incomplete     Labs: Basic Metabolic Panel: Recent Labs  Lab 10/15/17 1459 10/16/17 0401 10/17/17 0424 10/18/17 0529  NA 128* 134* 136 137  K 3.3* 3.0* 3.2* 3.3*  CL 78* 91* 100 109  CO2 23 28 28 22   GLUCOSE 141* 119* 134* 117*  BUN 58* 44* 23* 15  CREATININE 1.83* 0.94 0.54* 0.49*  CALCIUM 9.3 8.8* 9.0 8.0*  MG 2.2  --   --   --    Liver Function Tests: Recent Labs  Lab 10/15/17 1459 10/17/17 0424 10/18/17 0529  AST 186* 230* 378*  ALT 108* 94* 179*  ALKPHOS 114 81 79  BILITOT 5.1* 1.8* 1.1  PROT 7.9 5.8* 5.4*  ALBUMIN 3.8 2.8* 2.6*   Recent Labs  Lab 10/15/17 1459  LIPASE 205*   No results for input(s): AMMONIA in the last 168 hours. CBC: Recent Labs  Lab 10/15/17 1459 10/16/17 0401 10/18/17 0529  WBC 7.0 4.6 4.5  NEUTROABS 5.6  --   --   HGB 14.4 12.3* 11.5*  HCT 40.4 35.9* 33.6*  MCV 90.4 93.5 95.2  PLT 50* 55* 103*   Cardiac Enzymes: No results for input(s): CKTOTAL, CKMB, CKMBINDEX, TROPONINI in the last 168 hours. BNP: BNP (last 3 results) No results for input(s): BNP in the last 8760 hours.  ProBNP (last 3 results) No results for input(s): PROBNP in the last 8760 hours.  CBG: Recent Labs  Lab 10/15/17 1446  GLUCAP 138*       Signed:  Vassie Loll MD.  [facility] 10/21/2017, 4:03 PM

## 2017-10-24 ENCOUNTER — Encounter: Payer: Self-pay | Admitting: Cardiovascular Disease

## 2017-10-24 LAB — STOOL CULTURE: E COLI SHIGA TOXIN ASSAY: NEGATIVE

## 2017-10-24 LAB — STOOL CULTURE REFLEX - CMPCXR

## 2017-10-24 LAB — STOOL CULTURE REFLEX - RSASHR

## 2017-10-25 LAB — CULTURE, BLOOD (ROUTINE X 2)
Culture: NO GROWTH
Culture: NO GROWTH
SPECIAL REQUESTS: ADEQUATE
SPECIAL REQUESTS: ADEQUATE

## 2017-11-04 LAB — AEROBIC BACTERIA, ID BY SEQ.

## 2017-11-04 LAB — ORGANISM ID BY SEQUENCING

## 2017-11-22 LAB — CULTURE, BLOOD (ROUTINE X 2): SPECIAL REQUESTS: ADEQUATE

## 2017-11-30 ENCOUNTER — Encounter (HOSPITAL_COMMUNITY): Payer: Self-pay

## 2017-11-30 ENCOUNTER — Emergency Department (HOSPITAL_COMMUNITY): Payer: Self-pay

## 2017-11-30 ENCOUNTER — Inpatient Hospital Stay (HOSPITAL_COMMUNITY)
Admission: EM | Admit: 2017-11-30 | Discharge: 2017-12-02 | DRG: 897 | Disposition: A | Discharge status: Home / Self Care | Payer: Self-pay | Attending: Internal Medicine | Admitting: Internal Medicine

## 2017-11-30 ENCOUNTER — Other Ambulatory Visit: Payer: Self-pay

## 2017-11-30 DIAGNOSIS — F10920 Alcohol use, unspecified with intoxication, uncomplicated: Secondary | ICD-10-CM

## 2017-11-30 DIAGNOSIS — F419 Anxiety disorder, unspecified: Secondary | ICD-10-CM | POA: Diagnosis present

## 2017-11-30 DIAGNOSIS — R569 Unspecified convulsions: Secondary | ICD-10-CM | POA: Diagnosis present

## 2017-11-30 DIAGNOSIS — I429 Cardiomyopathy, unspecified: Secondary | ICD-10-CM | POA: Diagnosis present

## 2017-11-30 DIAGNOSIS — R740 Nonspecific elevation of levels of transaminase and lactic acid dehydrogenase [LDH]: Secondary | ICD-10-CM | POA: Diagnosis present

## 2017-11-30 DIAGNOSIS — F1010 Alcohol abuse, uncomplicated: Secondary | ICD-10-CM

## 2017-11-30 DIAGNOSIS — F17210 Nicotine dependence, cigarettes, uncomplicated: Secondary | ICD-10-CM | POA: Diagnosis present

## 2017-11-30 DIAGNOSIS — L089 Local infection of the skin and subcutaneous tissue, unspecified: Secondary | ICD-10-CM

## 2017-11-30 DIAGNOSIS — I4892 Unspecified atrial flutter: Secondary | ICD-10-CM | POA: Diagnosis present

## 2017-11-30 DIAGNOSIS — W260XXA Contact with knife, initial encounter: Secondary | ICD-10-CM | POA: Diagnosis present

## 2017-11-30 DIAGNOSIS — I110 Hypertensive heart disease with heart failure: Secondary | ICD-10-CM | POA: Diagnosis present

## 2017-11-30 DIAGNOSIS — Z888 Allergy status to other drugs, medicaments and biological substances status: Secondary | ICD-10-CM

## 2017-11-30 DIAGNOSIS — Z7982 Long term (current) use of aspirin: Secondary | ICD-10-CM

## 2017-11-30 DIAGNOSIS — F10939 Alcohol use, unspecified with withdrawal, unspecified: Secondary | ICD-10-CM | POA: Diagnosis present

## 2017-11-30 DIAGNOSIS — F10229 Alcohol dependence with intoxication, unspecified: Secondary | ICD-10-CM | POA: Diagnosis present

## 2017-11-30 DIAGNOSIS — E7800 Pure hypercholesterolemia, unspecified: Secondary | ICD-10-CM | POA: Diagnosis present

## 2017-11-30 DIAGNOSIS — Z720 Tobacco use: Secondary | ICD-10-CM | POA: Diagnosis present

## 2017-11-30 DIAGNOSIS — S61219A Laceration without foreign body of unspecified finger without damage to nail, initial encounter: Secondary | ICD-10-CM

## 2017-11-30 DIAGNOSIS — F429 Obsessive-compulsive disorder, unspecified: Secondary | ICD-10-CM | POA: Diagnosis present

## 2017-11-30 DIAGNOSIS — I10 Essential (primary) hypertension: Secondary | ICD-10-CM | POA: Diagnosis present

## 2017-11-30 DIAGNOSIS — F10220 Alcohol dependence with intoxication, uncomplicated: Secondary | ICD-10-CM | POA: Diagnosis present

## 2017-11-30 DIAGNOSIS — Z9114 Patient's other noncompliance with medication regimen: Secondary | ICD-10-CM

## 2017-11-30 DIAGNOSIS — S61218A Laceration without foreign body of other finger without damage to nail, initial encounter: Secondary | ICD-10-CM | POA: Diagnosis present

## 2017-11-30 DIAGNOSIS — G4733 Obstructive sleep apnea (adult) (pediatric): Secondary | ICD-10-CM | POA: Diagnosis present

## 2017-11-30 DIAGNOSIS — K2920 Alcoholic gastritis without bleeding: Secondary | ICD-10-CM | POA: Diagnosis present

## 2017-11-30 DIAGNOSIS — F329 Major depressive disorder, single episode, unspecified: Secondary | ICD-10-CM | POA: Diagnosis present

## 2017-11-30 DIAGNOSIS — I5020 Unspecified systolic (congestive) heart failure: Secondary | ICD-10-CM | POA: Diagnosis present

## 2017-11-30 DIAGNOSIS — I480 Paroxysmal atrial fibrillation: Secondary | ICD-10-CM | POA: Diagnosis present

## 2017-11-30 DIAGNOSIS — F10239 Alcohol dependence with withdrawal, unspecified: Principal | ICD-10-CM | POA: Diagnosis present

## 2017-11-30 DIAGNOSIS — L0390 Cellulitis, unspecified: Secondary | ICD-10-CM | POA: Diagnosis present

## 2017-11-30 DIAGNOSIS — F4310 Post-traumatic stress disorder, unspecified: Secondary | ICD-10-CM | POA: Diagnosis present

## 2017-11-30 MED ORDER — TETANUS-DIPHTH-ACELL PERTUSSIS 5-2.5-18.5 LF-MCG/0.5 IM SUSP
0.5000 mL | Freq: Once | INTRAMUSCULAR | Status: AC
  Administered 2017-12-01: 0.5 mL via INTRAMUSCULAR
  Filled 2017-11-30: qty 0.5

## 2017-11-30 NOTE — ED Triage Notes (Signed)
Pt arrives via [facility] from home, here because friends of Pt called [facility] to have Pt brought to ED due to chronic ETOH addiction and a plethera of medical disorders. Pt believes his name and personality is "Marcial Pacas" right now. Per [facility], Pt has been heavily drinking for the past few days. Pt reports he has been non-compliant with his medications at home.
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
## 2017-11-30 NOTE — ED Notes (Addendum)
Pt is odoriferous Has been drinking alcohol for the last 3 days Shouting in triage when asked if suicidal  "get real brother"   Slurred, scatalogical speech, defensive, laughing inappropriately  Disheveled and dirty

## 2017-12-01 ENCOUNTER — Encounter (HOSPITAL_COMMUNITY): Payer: Self-pay | Admitting: Internal Medicine

## 2017-12-01 DIAGNOSIS — I4892 Unspecified atrial flutter: Secondary | ICD-10-CM

## 2017-12-01 DIAGNOSIS — F10229 Alcohol dependence with intoxication, unspecified: Secondary | ICD-10-CM

## 2017-12-01 DIAGNOSIS — F4310 Post-traumatic stress disorder, unspecified: Secondary | ICD-10-CM

## 2017-12-01 DIAGNOSIS — I10 Essential (primary) hypertension: Secondary | ICD-10-CM

## 2017-12-01 DIAGNOSIS — S61219A Laceration without foreign body of unspecified finger without damage to nail, initial encounter: Secondary | ICD-10-CM

## 2017-12-01 DIAGNOSIS — Z720 Tobacco use: Secondary | ICD-10-CM

## 2017-12-01 DIAGNOSIS — L089 Local infection of the skin and subcutaneous tissue, unspecified: Secondary | ICD-10-CM

## 2017-12-01 DIAGNOSIS — F10231 Alcohol dependence with withdrawal delirium: Secondary | ICD-10-CM

## 2017-12-01 DIAGNOSIS — G4733 Obstructive sleep apnea (adult) (pediatric): Secondary | ICD-10-CM

## 2017-12-01 DIAGNOSIS — I429 Cardiomyopathy, unspecified: Secondary | ICD-10-CM

## 2017-12-01 LAB — CBC
HCT: 47.4 % (ref 39.0–52.0)
Hemoglobin: 16 g/dL (ref 13.0–17.0)
MCH: 31.6 pg (ref 26.0–34.0)
MCHC: 33.8 g/dL (ref 30.0–36.0)
MCV: 93.5 fL (ref 80.0–100.0)
Platelets: 207 10*3/uL (ref 150–400)
RBC: 5.07 MIL/uL (ref 4.22–5.81)
RDW: 14.3 % (ref 11.5–15.5)
WBC: 5.8 10*3/uL (ref 4.0–10.5)
nRBC: 0 % (ref 0.0–0.2)

## 2017-12-01 LAB — COMPREHENSIVE METABOLIC PANEL
ALBUMIN: 4.3 g/dL (ref 3.5–5.0)
ALK PHOS: 86 U/L (ref 38–126)
ALT: 52 U/L — ABNORMAL HIGH (ref 0–44)
ALT: 54 U/L — ABNORMAL HIGH (ref 0–44)
ANION GAP: 19 — AB (ref 5–15)
AST: 81 U/L — AB (ref 15–41)
AST: 87 U/L — AB (ref 15–41)
Albumin: 4.6 g/dL (ref 3.5–5.0)
Alkaline Phosphatase: 88 U/L (ref 38–126)
Anion gap: 19 — ABNORMAL HIGH (ref 5–15)
BILIRUBIN TOTAL: 1.3 mg/dL — AB (ref 0.3–1.2)
BUN: 14 mg/dL (ref 6–20)
BUN: 12 mg/dL (ref 6–20)
CO2: 22 mmol/L (ref 22–32)
CO2: 23 mmol/L (ref 22–32)
Calcium: 8.5 mg/dL — ABNORMAL LOW (ref 8.9–10.3)
Calcium: 8.6 mg/dL — ABNORMAL LOW (ref 8.9–10.3)
Chloride: 100 mmol/L (ref 98–111)
Chloride: 95 mmol/L — ABNORMAL LOW (ref 98–111)
Creatinine, Ser: 0.63 mg/dL (ref 0.61–1.24)
Creatinine, Ser: 0.67 mg/dL (ref 0.61–1.24)
GFR calc Af Amer: >60 mL/min (ref >60)
GFR calc Af Amer: >60 mL/min (ref >60)
GFR calc non Af Amer: >60 mL/min (ref >60)
GLUCOSE: 118 mg/dL — AB (ref 70–99)
Glucose, Bld: 88 mg/dL (ref 70–99)
POTASSIUM: 3.6 mmol/L (ref 3.5–5.1)
POTASSIUM: 3.8 mmol/L (ref 3.5–5.1)
SODIUM: 136 mmol/L (ref 135–145)
Sodium: 142 mmol/L (ref 135–145)
TOTAL PROTEIN: 7.7 g/dL (ref 6.5–8.1)
TOTAL PROTEIN: 7.9 g/dL (ref 6.5–8.1)
Total Bilirubin: 1.1 mg/dL (ref 0.3–1.2)

## 2017-12-01 LAB — PHOSPHORUS: Phosphorus: 3.1 mg/dL (ref 2.5–4.6)

## 2017-12-01 LAB — RAPID URINE DRUG SCREEN, HOSP PERFORMED
Amphetamines: NOT DETECTED
Barbiturates: NOT DETECTED
Benzodiazepines: NOT DETECTED
Cocaine: NOT DETECTED
OPIATES: NOT DETECTED
Tetrahydrocannabinol: NOT DETECTED

## 2017-12-01 LAB — ACETAMINOPHEN LEVEL: Acetaminophen (Tylenol), Serum: <10 ug/mL — ABNORMAL LOW (ref 10–30)

## 2017-12-01 LAB — URINALYSIS, ROUTINE W REFLEX MICROSCOPIC
Bacteria, UA: NONE SEEN
Bilirubin Urine: NEGATIVE
GLUCOSE, UA: NEGATIVE mg/dL
Hgb urine dipstick: NEGATIVE
Ketones, ur: 20 mg/dL — AB
LEUKOCYTES UA: NEGATIVE
NITRITE: NEGATIVE
Protein, ur: 30 mg/dL — AB
SPECIFIC GRAVITY, URINE: 1.024 (ref 1.005–1.030)
pH: 6 (ref 5.0–8.0)

## 2017-12-01 LAB — SALICYLATE LEVEL

## 2017-12-01 LAB — CK: Total CK: 82 U/L (ref 49–397)

## 2017-12-01 LAB — ETHANOL
ALCOHOL ETHYL (B): 374 mg/dL — AB (ref <10)
Alcohol, Ethyl (B): 172 mg/dL — ABNORMAL HIGH (ref <10)

## 2017-12-01 LAB — LIPASE, BLOOD: Lipase: 22 U/L (ref 11–51)

## 2017-12-01 LAB — TROPONIN I: Troponin I: <0.03 ng/mL (ref <0.03)

## 2017-12-01 LAB — MAGNESIUM: MAGNESIUM: 1.6 mg/dL — AB (ref 1.7–2.4)

## 2017-12-01 MED ORDER — THIAMINE HCL 100 MG/ML IJ SOLN
Freq: Once | INTRAVENOUS | Status: AC
  Administered 2017-12-01: 11:00:00 via INTRAVENOUS
  Filled 2017-12-01: qty 1000

## 2017-12-01 MED ORDER — VILAZODONE HCL 40 MG PO TABS
40.0000 mg | ORAL_TABLET | Freq: Every day | ORAL | Status: DC
  Administered 2017-12-02: 40 mg via ORAL

## 2017-12-01 MED ORDER — FOLIC ACID 1 MG PO TABS
1.0000 mg | ORAL_TABLET | Freq: Every day | ORAL | Status: DC
  Administered 2017-12-01 – 2017-12-02 (×2): 1 mg via ORAL
  Filled 2017-12-01 (×2): qty 1

## 2017-12-01 MED ORDER — NICOTINE 21 MG/24HR TD PT24
21.0000 mg | MEDICATED_PATCH | Freq: Every day | TRANSDERMAL | Status: DC
  Administered 2017-12-01 – 2017-12-02 (×2): 21 mg via TRANSDERMAL
  Filled 2017-12-01 (×2): qty 1

## 2017-12-01 MED ORDER — LORAZEPAM 1 MG PO TABS: 0.0000 mg | ORAL_TABLET | Freq: Two times a day (BID) | ORAL | Status: DC

## 2017-12-01 MED ORDER — ONDANSETRON HCL 4 MG/2ML IJ SOLN: 4.0000 mg | Freq: Four times a day (QID) | INTRAMUSCULAR | Status: DC | PRN

## 2017-12-01 MED ORDER — ALBUTEROL SULFATE (2.5 MG/3ML) 0.083% IN NEBU: 2.5000 mg | INHALATION_SOLUTION | RESPIRATORY_TRACT | Status: DC | PRN

## 2017-12-01 MED ORDER — CARVEDILOL 12.5 MG PO TABS
25.0000 mg | ORAL_TABLET | Freq: Two times a day (BID) | ORAL | Status: DC
  Administered 2017-12-01 – 2017-12-02 (×3): 25 mg via ORAL
  Filled 2017-12-01 (×3): qty 2

## 2017-12-01 MED ORDER — LORAZEPAM 2 MG/ML IJ SOLN
1.0000 mg | Freq: Four times a day (QID) | INTRAMUSCULAR | Status: DC | PRN
  Administered 2017-12-01: 1 mg via INTRAVENOUS
  Filled 2017-12-01: qty 1

## 2017-12-01 MED ORDER — LOSARTAN POTASSIUM 50 MG PO TABS
25.0000 mg | ORAL_TABLET | Freq: Every day | ORAL | Status: DC
  Administered 2017-12-01 – 2017-12-02 (×2): 25 mg via ORAL
  Filled 2017-12-01 (×2): qty 1

## 2017-12-01 MED ORDER — ONDANSETRON HCL 4 MG/2ML IJ SOLN
4.0000 mg | Freq: Once | INTRAMUSCULAR | Status: AC
  Administered 2017-12-01: 4 mg via INTRAVENOUS
  Filled 2017-12-01: qty 2

## 2017-12-01 MED ORDER — THIAMINE HCL 100 MG/ML IJ SOLN: 100.0000 mg | Freq: Every day | INTRAMUSCULAR | Status: DC

## 2017-12-01 MED ORDER — ASPIRIN EC 81 MG PO TBEC
162.0000 mg | DELAYED_RELEASE_TABLET | Freq: Every day | ORAL | Status: DC
  Administered 2017-12-01 – 2017-12-02 (×2): 162 mg via ORAL
  Filled 2017-12-01 (×2): qty 2

## 2017-12-01 MED ORDER — HYDROGEN PEROXIDE 3 % EX SOLN
Freq: Every day | CUTANEOUS | Status: DC
  Administered 2017-12-01: 11:00:00 via TOPICAL
  Filled 2017-12-01 (×2): qty 473

## 2017-12-01 MED ORDER — PANTOPRAZOLE SODIUM 40 MG PO TBEC
40.0000 mg | DELAYED_RELEASE_TABLET | Freq: Every day | ORAL | Status: DC
  Administered 2017-12-01 – 2017-12-02 (×2): 40 mg via ORAL
  Filled 2017-12-01 (×2): qty 1

## 2017-12-01 MED ORDER — SODIUM CHLORIDE 0.9 % IV SOLN: INTRAVENOUS | Status: DC

## 2017-12-01 MED ORDER — AMOXICILLIN-POT CLAVULANATE 875-125 MG PO TABS
1.0000 | ORAL_TABLET | Freq: Two times a day (BID) | ORAL | Status: DC
  Administered 2017-12-01 – 2017-12-02 (×3): 1 via ORAL
  Filled 2017-12-01 (×3): qty 1

## 2017-12-01 MED ORDER — LORAZEPAM 1 MG PO TABS
1.0000 mg | ORAL_TABLET | Freq: Four times a day (QID) | ORAL | Status: DC | PRN
  Administered 2017-12-01 – 2017-12-02 (×2): 1 mg via ORAL
  Filled 2017-12-01 (×2): qty 2

## 2017-12-01 MED ORDER — LORAZEPAM 1 MG PO TABS
0.0000 mg | ORAL_TABLET | Freq: Four times a day (QID) | ORAL | Status: DC
  Administered 2017-12-01: 2 mg via ORAL
  Administered 2017-12-01 – 2017-12-02 (×3): 1 mg via ORAL
  Filled 2017-12-01: qty 2
  Filled 2017-12-01: qty 1
  Filled 2017-12-01: qty 2
  Filled 2017-12-01: qty 1

## 2017-12-01 MED ORDER — HEPARIN SODIUM (PORCINE) 5000 UNIT/ML IJ SOLN
5000.0000 [IU] | Freq: Three times a day (TID) | INTRAMUSCULAR | Status: DC
  Administered 2017-12-01 – 2017-12-02 (×4): 5000 [IU] via SUBCUTANEOUS
  Filled 2017-12-01 (×6): qty 1

## 2017-12-01 MED ORDER — SODIUM CHLORIDE 0.9 % IV BOLUS
1000.0000 mL | Freq: Once | INTRAVENOUS | Status: AC
  Administered 2017-12-01: 1000 mL via INTRAVENOUS

## 2017-12-01 MED ORDER — ADULT MULTIVITAMIN W/MINERALS CH
1.0000 | ORAL_TABLET | Freq: Every day | ORAL | Status: DC
  Administered 2017-12-01 – 2017-12-02 (×2): 1 via ORAL
  Filled 2017-12-01 (×2): qty 1

## 2017-12-01 MED ORDER — VITAMIN B-1 100 MG PO TABS
100.0000 mg | ORAL_TABLET | Freq: Every day | ORAL | Status: DC
  Administered 2017-12-01 – 2017-12-02 (×2): 100 mg via ORAL
  Filled 2017-12-01 (×2): qty 1

## 2017-12-01 MED ORDER — LISINOPRIL 10 MG PO TABS: 10.0000 mg | ORAL_TABLET | Freq: Every day | ORAL | Status: DC

## 2017-12-01 MED ORDER — VILAZODONE HCL 40 MG PO TABS: 40.0000 mg | ORAL_TABLET | Freq: Every day | ORAL | Status: DC

## 2017-12-01 NOTE — Consult Note (Signed)
WOC Nurse wound consult note Reason for Consult:left hand, index finger traumatic injury Wound type:trauma Pressure Injury POA: N/A Measurement: 2.4cm x 1.5cm x 0.1cm depth over 40% of wound.  60% of wound with depth obscured by the presence of eschar Wound bed:As described above Drainage (amount, consistency, odor) scanty serosanguinous Periwound: intact, mild erythema, no warmth or induration. Patient able to bend finger at first joint. Dressing procedure/placement/frequency: I will implement a POC that is conservative, but one that includes twice daily cleansing and dressing with an antimicrobial gauze (iodoform).  Post hospitalization, patient may wash with antimicrobial soap and water and dress with dry gauze after application of polysporin ointment and follow up with his PCP, continuing antibiotics as directed..  WOC nursing team will not follow, but will remain available to this patient, the nursing and medical teams.  Please re-consult if needed. Thanks, Ladona Mow, MSN, RN, GNP, [facility] [place]  Pager# [phone]

## 2017-12-01 NOTE — ED Notes (Addendum)
CRITICAL VALUE ALERT  Critical Value:  ETOH 374  Date & Time Notied:  12/01/17 0104  Provider Notified:Dr. Kathie Rhodes Rancour  Orders Received/Actions taken: n/a

## 2017-12-01 NOTE — ED Notes (Signed)
Per Dr. Manus Gunning okay to have phone at bedside.
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
## 2017-12-01 NOTE — ED Notes (Signed)
Pt has not voided.  Advised pt that urine sample is needed

## 2017-12-01 NOTE — ED Notes (Signed)
Patient is alert and oriented, ambulatory.

## 2017-12-02 DIAGNOSIS — S61219D Laceration without foreign body of unspecified finger without damage to nail, subsequent encounter: Secondary | ICD-10-CM

## 2017-12-02 DIAGNOSIS — F1010 Alcohol abuse, uncomplicated: Secondary | ICD-10-CM

## 2017-12-02 LAB — BASIC METABOLIC PANEL
ANION GAP: 9 (ref 5–15)
BUN: 17 mg/dL (ref 6–20)
CO2: 28 mmol/L (ref 22–32)
Calcium: 9 mg/dL (ref 8.9–10.3)
Chloride: 101 mmol/L (ref 98–111)
Creatinine, Ser: 0.61 mg/dL (ref 0.61–1.24)
GFR calc Af Amer: >60 mL/min (ref >60)
GLUCOSE: 124 mg/dL — AB (ref 70–99)
POTASSIUM: 3.5 mmol/L (ref 3.5–5.1)
SODIUM: 138 mmol/L (ref 135–145)

## 2017-12-02 LAB — CBC
HCT: 38.3 % — ABNORMAL LOW (ref 39.0–52.0)
HEMOGLOBIN: 12.9 g/dL — AB (ref 13.0–17.0)
MCH: 32 pg (ref 26.0–34.0)
MCHC: 33.7 g/dL (ref 30.0–36.0)
MCV: 95 fL (ref 80.0–100.0)
Platelets: 101 10*3/uL — ABNORMAL LOW (ref 150–400)
RBC: 4.03 MIL/uL — AB (ref 4.22–5.81)
RDW: 13.4 % (ref 11.5–15.5)
WBC: 4.1 10*3/uL (ref 4.0–10.5)
nRBC: 0 % (ref 0.0–0.2)

## 2017-12-02 LAB — VITAMIN B12: Vitamin B-12: 398 pg/mL (ref 180–914)

## 2017-12-02 MED ORDER — HYDROGEN PEROXIDE 3 % EX SOLN: Freq: Every day | CUTANEOUS | Status: DC

## 2017-12-02 MED ORDER — CHLORDIAZEPOXIDE HCL 5 MG PO CAPS: ORAL_CAPSULE | ORAL | 0 refills | Status: DC

## 2017-12-02 MED ORDER — AMOXICILLIN-POT CLAVULANATE 875-125 MG PO TABS: 1.0000 | ORAL_TABLET | Freq: Two times a day (BID) | ORAL | 0 refills | Status: AC

## 2017-12-02 MED ORDER — NICOTINE 21 MG/24HR TD PT24: 21.0000 mg | MEDICATED_PATCH | Freq: Every day | TRANSDERMAL | 1 refills | Status: DC

## 2017-12-02 MED ORDER — VITAMIN B-12 1000 MCG PO TABS: 1000.0000 ug | ORAL_TABLET | Freq: Every day | ORAL | 1 refills | Status: DC
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
## 2017-12-02 NOTE — Discharge Instructions (Signed)
I will implement a POC that is conservative, but one that includes twice daily cleansing and dressing with an antimicrobial gauze (iodoform).  Post hospitalization, patient may wash with antimicrobial soap and water and dress with dry gauze after application of polysporin ointment and follow up with his PCP, continuing antibiotics as directed.Marland Kitchen

## 2017-12-02 NOTE — Progress Notes (Signed)
Dressing changed on wound left index finer. Wound appeared black with no drainage with slight odor. Pt washed hands with soap and water according to nursing wound-care orders. Rinsed with NS, patted dry with sterile gauze; Xeroform gauze applied surrounded by Lynnea Ferrier and paper tape. Pt tolerated well with no complaint of pain or discomfort.

## 2017-12-02 NOTE — Care Management (Signed)
Pt admitted with etoh withdrawal. Pt has no insurance, no PCP. Pt would like appointment with [facility] as community resource to finding PCP. CM has made pt appointment for 12/03/17 at 3pm.   [facility] [address] [place], [place] [address] [phone]

## 2017-12-02 NOTE — Progress Notes (Signed)
Patient is to be discharged home and in stable condition. Patient's IV and telemetry removed, WNL. Patient given discharge instructions and verbalized understanding. Home medications returned to patient prior to discharge. All questions addressed and answered. Patient denies the need for a wheelchair escort out, refusing to stay in his patient room until a ride is available, requesting to sit in the lobby and wait for his transportation. This RN explained that once he has left the floor he will no longer be a patient, will be responsible for finding his own transportation and cannot return to the patient room to which he is agreeable. Patient to ambulate to lobby.  Quita Skye, RN

## 2017-12-04 ENCOUNTER — Ambulatory Visit: Payer: Self-pay | Admitting: Physician Assistant

## 2017-12-04 ENCOUNTER — Encounter: Payer: Self-pay | Admitting: Physician Assistant

## 2017-12-04 VITALS — BP 143/95 | HR 91 | Temp 97.7°F | Ht 67.75 in | Wt 225.2 lb

## 2017-12-04 DIAGNOSIS — I429 Cardiomyopathy, unspecified: Secondary | ICD-10-CM

## 2017-12-04 DIAGNOSIS — I4892 Unspecified atrial flutter: Secondary | ICD-10-CM

## 2017-12-04 DIAGNOSIS — E669 Obesity, unspecified: Secondary | ICD-10-CM

## 2017-12-04 DIAGNOSIS — M62541 Muscle wasting and atrophy, not elsewhere classified, right hand: Secondary | ICD-10-CM

## 2017-12-04 DIAGNOSIS — E785 Hyperlipidemia, unspecified: Secondary | ICD-10-CM

## 2017-12-04 DIAGNOSIS — Z7689 Persons encountering health services in other specified circumstances: Secondary | ICD-10-CM

## 2017-12-04 DIAGNOSIS — I10 Essential (primary) hypertension: Secondary | ICD-10-CM

## 2017-12-04 DIAGNOSIS — S61219D Laceration without foreign body of unspecified finger without damage to nail, subsequent encounter: Secondary | ICD-10-CM

## 2017-12-04 DIAGNOSIS — F1011 Alcohol abuse, in remission: Secondary | ICD-10-CM

## 2017-12-04 DIAGNOSIS — L089 Local infection of the skin and subcutaneous tissue, unspecified: Secondary | ICD-10-CM

## 2017-12-04 DIAGNOSIS — F17200 Nicotine dependence, unspecified, uncomplicated: Secondary | ICD-10-CM

## 2017-12-04 MED ORDER — LOSARTAN POTASSIUM 50 MG PO TABS: 50.0000 mg | ORAL_TABLET | Freq: Every day | ORAL | 0 refills | Status: DC

## 2017-12-04 MED ORDER — CARVEDILOL 25 MG PO TABS: 25.0000 mg | ORAL_TABLET | Freq: Two times a day (BID) | ORAL | 0 refills | Status: DC

## 2017-12-04 NOTE — Progress Notes (Signed)
BP (!) 143/95 (BP Location: Right Arm, Patient Position: Sitting, Cuff Size: Normal)   Pulse 91   Temp 97.7 F (36.5 C)   Ht 5' 7.75" (1.721 m)   Wt 225 lb 4 oz (102.2 kg)   SpO2 100%   BMI 34.50 kg/m    Subjective:    Patient ID: Malik Edwards, male    DOB: 1975/03/06, 42 y.o.   MRN: [id number]  HPI: Malik Edwards is a 42 y.o. male presenting on 12/04/2017 for New Patient (Initial Visit)   HPI   Chief Complaint  Patient presents with  . New Patient (Initial Visit)    Discharged from hospital on 12/02/17 from alcohol withdrawal, paroxysmal atrial flutter, cardiomyopahty, htn, transaminitis, and infection of the finger.    Pt was discharged from hospital 10/21/17 with etoh withdrawal as well.  Pt discharged from hospital 02/21/17 after being admitted with acute intoxication. Notes from those are reviewed.   Pt does not have follow up with cardiology scheduled.   Pt used to work in [employer] (for [employer])  He has not drank since he got out of hospital  Pt has seen MH at [facility] ([facility]) in [place] and Lorenda Cahill in [place].  He has not seen anyone recently   Pt c/o wasting R hand- started about a month ago.  He stopped working about a month ago.  Pt says he has a lot of pain in the hand, primarily the 4th and 5th fingers   Relevant past medical, surgical, family and social history reviewed and updated as indicated. Interim medical history since our last visit reviewed. Allergies and medications reviewed and updated.   Current Outpatient Medications:  .  amoxicillin-clavulanate (AUGMENTIN) 875-125 MG tablet, Take 1 tablet by mouth every 12 (twelve) hours for 8 days., Disp: 16 tablet, Rfl: 0 .  aspirin EC 81 MG EC tablet, Take 2 tablets (162 mg total) by mouth daily. For heart health, Disp: , Rfl:  .  carvedilol (COREG) 25 MG tablet, Take 1 tablet (25 mg total) by mouth 2 (two) times daily., Disp: 180 tablet, Rfl: 1 .   chlordiazePOXIDE (LIBRIUM) 5 MG capsule, Take 4 capsules by mouth daily x2 days; then 3 capsules by mouth daily x2 days; then 2 capsules by mouth daily x3 days; then 1 capsule by mouth daily x3 days and stop Librium., Disp: 23 capsule, Rfl: 0 .  losartan (COZAAR) 25 MG tablet, Take 1 tablet (25 mg total) by mouth daily., Disp: 30 tablet, Rfl: 3 .  Multiple Vitamin (MULTIVITAMIN WITH MINERALS) TABS tablet, Take 1 tablet by mouth daily., Disp: , Rfl:  .  Vilazodone HCl (VIIBRYD) 40 MG TABS, Take 1 tablet (40 mg total) by mouth daily with breakfast. For OCD/depression, Disp: 30 tablet, Rfl: 0 .  vitamin B-12 (CYANOCOBALAMIN) 1000 MCG tablet, Take 1 tablet (1,000 mcg total) by mouth daily., Disp: 30 tablet, Rfl: 1 .  albuterol (PROVENTIL HFA;VENTOLIN HFA) 108 (90 Base) MCG/ACT inhaler, Inhale 2 puffs into the lungs every 4 (four) hours as needed for wheezing or shortness of breath (cough, shortness of breath or wheezing.). (Patient not taking: Reported on 12/04/2017), Disp: 1 Inhaler, Rfl: 1 .  folic acid (FOLVITE) 1 MG tablet, Take 1 tablet (1 mg total) by mouth daily. (Patient not taking: Reported on 12/04/2017), Disp: 30 tablet, Rfl: 1 .  hydrogen peroxide 3 % external solution, Apply topically daily. Use as instructed to clean left index finger wound. (Patient  not taking: Reported on 12/04/2017), Disp: , Rfl:  .  nicotine (NICODERM CQ - DOSED IN MG/24 HOURS) 21 mg/24hr patch, Place 1 patch (21 mg total) onto the skin daily. (Patient not taking: Reported on 12/04/2017), Disp: 28 patch, Rfl: 1 .  pantoprazole (PROTONIX) 40 MG tablet, Take 1 tablet (40 mg total) by mouth daily. (Patient not taking: Reported on 12/04/2017), Disp: 30 tablet, Rfl: 1    Review of Systems  Constitutional: Positive for chills, diaphoresis and fatigue. Negative for appetite change, fever and unexpected weight change.  HENT: Negative for congestion, dental problem, drooling, ear pain, facial swelling, hearing loss, mouth sores,  sneezing, sore throat, trouble swallowing and voice change.   Eyes: Negative for pain, discharge, redness, itching and visual disturbance.  Respiratory: Negative for cough, choking, shortness of breath and wheezing.   Cardiovascular: Negative for chest pain, palpitations and leg swelling.  Gastrointestinal: Positive for diarrhea. Negative for abdominal pain, blood in stool, constipation and vomiting.  Endocrine: Positive for polydipsia. Negative for cold intolerance and heat intolerance.  Genitourinary: Negative for decreased urine volume, dysuria and hematuria.  Musculoskeletal: Positive for arthralgias and gait problem. Negative for back pain.  Skin: Negative for rash.  Allergic/Immunologic: Negative for environmental allergies.  Neurological: Negative for seizures, syncope, light-headedness and headaches.  Hematological: Negative for adenopathy.  Psychiatric/Behavioral: Positive for dysphoric mood. Negative for agitation and suicidal ideas. The patient is nervous/anxious.     Per HPI unless specifically indicated above     Objective:    BP (!) 143/95 (BP Location: Right Arm, Patient Position: Sitting, Cuff Size: Normal)   Pulse 91   Temp 97.7 F (36.5 C)   Ht 5' 7.75" (1.721 m)   Wt 225 lb 4 oz (102.2 kg)   SpO2 100%   BMI 34.50 kg/m   Wt Readings from Last 3 Encounters:  12/04/17 225 lb 4 oz (102.2 kg)  12/02/17 219 lb 12.8 oz (99.7 kg)  10/17/17 231 lb 0.7 oz (104.8 kg)    Physical Exam  Constitutional: He is oriented to person, place, and time. He appears well-developed and well-nourished.  HENT:  Head: Normocephalic and atraumatic.  Mouth/Throat: Oropharynx is clear and moist. No oropharyngeal exudate.  Eyes: Pupils are equal, round, and reactive to light. Conjunctivae and EOM are normal.  Neck: Neck supple. No thyromegaly present.  Cardiovascular: Normal rate and regular rhythm.  Pulmonary/Chest: Effort normal and breath sounds normal. He has no wheezes. He has no  rales.  Abdominal: Soft. Bowel sounds are normal. He exhibits no mass. There is no hepatosplenomegaly. There is no tenderness.  Musculoskeletal: He exhibits no edema.       Right hand: He exhibits tenderness. He exhibits normal range of motion, normal capillary refill and no swelling.       Left hand: He exhibits laceration. He exhibits normal range of motion and no tenderness. Normal sensation noted.       Hands: Infection L index finger as pictured.   Muscle wasting dorsum R hand.  Grips strong and equal bilaterally.    Lymphadenopathy:    He has no cervical adenopathy.  Neurological: He is alert and oriented to person, place, and time.  Skin: Skin is warm and dry. Bruising noted. No rash noted.  Some bruises on abdominal wall due to recent injections while in the hospital.  No bruising seen elsewhere  Psychiatric: He has a normal mood and affect. His behavior is normal. Thought content normal.  Vitals reviewed.  Assessment & Plan:   Encounter Diagnoses  Name Primary?  . Encounter to establish care Yes  . Essential hypertension   . Hyperlipidemia, unspecified hyperlipidemia type   . Muscle wasting and atrophy, not elsewhere classified, right hand   . Cardiomyopathy, unspecified type (HCC)   . Tobacco use disorder   . Alcohol abuse, in remission   . Laceration of finger with infection, subsequent encounter   . Paroxysmal atrial flutter (HCC)   . Obesity, unspecified classification, unspecified obesity type, unspecified whether serious comorbidity present     -will check fasting Labs- cmp and lipids -counseled pt on Hand exercises. He can apply Ice for 10-20 minutes 4 or 5 times daily.  He can take nsaid if needed but pt cautioned to avoid taking more than directed. Pt is highly allergic to gabapentin so that is not an option.   Consider PT and/or referral at F/U -Refer to cardiology -pt is given application for [facility] -will Increase losartan due to poorly  controlled blood pressure -urged pt to go to [facility] for [facility] issues -Urged pt to return to AA to help with alcohol addiction .  He went there in the past -pt is signed up for [facility]- (coreg and losartan) -pt to Continue augmentin for his finger infection and wound care as instructed -pt to continue daily aspirin -pt will follow up in 1 month.  He is to RTO sooner pnr

## 2017-12-04 NOTE — Patient Instructions (Addendum)
Financial Counselor- [phone]  ---------------------------------------   Hand Exercises Hand exercises can be helpful to almost anyone. These exercises can strengthen the hands, improve flexibility and movement, and increase blood flow to the hands. These results can make work and daily tasks easier. Hand exercises can be especially helpful for people who have joint pain from arthritis or have nerve damage from overuse (carpal tunnel syndrome). These exercises can also help people who have injured a hand. Most of these hand exercises are fairly gentle stretching routines. You can do them often throughout the day. Still, it is a good idea to ask your health care provider which exercises would be best for you. Warming your hands before exercise may help to reduce stiffness. You can do this with gentle massage or by placing your hands in warm water for 15 minutes. Also, make sure you pay attention to your level of hand pain as you begin an exercise routine. Exercises Knuckle Bend Repeat this exercise 5-10 times with each hand. 1. Stand or sit with your arm, hand, and all five fingers pointed straight up. Make sure your wrist is straight. 2. Gently and slowly bend your fingers down and inward until the tips of your fingers are touching the tops of your palm. 3. Hold this position for a few seconds. 4. Extend your fingers out to their original position, all pointing straight up again.  Finger Fan Repeat this exercise 5-10 times with each hand. 1. Hold your arm and hand out in front of you. Keep your wrist straight. 2. Squeeze your hand into a fist. 3. Hold this position for a few seconds. 4. Loel Dubonnet out, or spread apart, your hand and fingers as much as possible, stretching every joint fully.  Tabletop Repeat this exercise 5-10 times with each hand. 1. Stand or sit with your arm, hand, and all five fingers pointed straight up. Make sure your wrist is straight. 2. Gently and slowly bend your  fingers at the knuckles where they meet the hand until your hand is making an upside-down L shape. Your fingers should form a tabletop. 3. Hold this position for a few seconds. 4. Extend your fingers out to their original position, all pointing straight up again.  Making Os Repeat this exercise 5-10 times with each hand. 1. Stand or sit with your arm, hand, and all five fingers pointed straight up. Make sure your wrist is straight. 2. Make an O shape by touching your pointer finger to your thumb. Hold for a few seconds. Then open your hand wide. 3. Repeat this motion with each finger on your hand.  Table Spread Repeat this exercise 5-10 times with each hand. 1. Place your hand on a table with your palm facing down. Make sure your wrist is straight. 2. Spread your fingers out as much as possible. Hold this position for a few seconds. 3. Slide your fingers back together again. Hold for a few seconds.  Ball Grip  Repeat this exercise 10-15 times with each hand. 1. Hold a tennis ball or another soft ball in your hand. 2. While slowly increasing pressure, squeeze the ball as hard as possible. 3. Squeeze as hard as you can for 3-5 seconds. 4. Relax and repeat.  Wrist Curls Repeat this exercise 10-15 times with each hand. 1. Sit in a chair that has armrests. 2. Hold a light weight in your hand, such as a dumbbell that weighs 1-3 pounds (0.5-1.4 kg). Ask your health care provider what weight would be best for  you. 3. Rest your hand just over the end of the chair arm with your palm facing up. 4. Gently pivot your wrist up and down while holding the weight. Do not twist your wrist from side to side.  Contact a health care provider if:  Your hand pain or discomfort gets much worse when you do an exercise.  Your hand pain or discomfort does not improve within 2 hours after you exercise. If you have any of these problems, stop doing these exercises right away. Do not do them again unless your  health care provider says that you can. Get help right away if:  You develop sudden, severe hand pain. If this happens, stop doing these exercises right away. Do not do them again unless your health care provider says that you can. This information is not intended to replace advice given to you by your health care provider. Make sure you discuss any questions you have with your health care provider. Document Released: 12/27/2014 Document Revised: 06/23/2015 Document Reviewed: 07/26/2014 Elsevier Interactive Patient Education  [employer].

## 2017-12-06 LAB — CULTURE, BLOOD (ROUTINE X 2)
CULTURE: NO GROWTH
Culture: NO GROWTH
Special Requests: ADEQUATE
Special Requests: ADEQUATE

## 2017-12-19 ENCOUNTER — Other Ambulatory Visit: Payer: Self-pay

## 2017-12-19 ENCOUNTER — Inpatient Hospital Stay (HOSPITAL_COMMUNITY)
Admission: EM | Admit: 2017-12-19 | Discharge: 2017-12-21 | DRG: 378 | Disposition: A | Discharge status: Home / Self Care | Payer: Self-pay | Attending: Family Medicine | Admitting: Family Medicine

## 2017-12-19 ENCOUNTER — Encounter (HOSPITAL_COMMUNITY): Payer: Self-pay | Admitting: Emergency Medicine

## 2017-12-19 ENCOUNTER — Emergency Department (HOSPITAL_COMMUNITY): Payer: Self-pay

## 2017-12-19 DIAGNOSIS — Z720 Tobacco use: Secondary | ICD-10-CM | POA: Diagnosis present

## 2017-12-19 DIAGNOSIS — Z8249 Family history of ischemic heart disease and other diseases of the circulatory system: Secondary | ICD-10-CM

## 2017-12-19 DIAGNOSIS — D6959 Other secondary thrombocytopenia: Secondary | ICD-10-CM | POA: Diagnosis present

## 2017-12-19 DIAGNOSIS — Z7982 Long term (current) use of aspirin: Secondary | ICD-10-CM

## 2017-12-19 DIAGNOSIS — D696 Thrombocytopenia, unspecified: Secondary | ICD-10-CM | POA: Diagnosis present

## 2017-12-19 DIAGNOSIS — K5730 Diverticulosis of large intestine without perforation or abscess without bleeding: Secondary | ICD-10-CM | POA: Diagnosis present

## 2017-12-19 DIAGNOSIS — Z888 Allergy status to other drugs, medicaments and biological substances status: Secondary | ICD-10-CM

## 2017-12-19 DIAGNOSIS — I4892 Unspecified atrial flutter: Secondary | ICD-10-CM | POA: Diagnosis present

## 2017-12-19 DIAGNOSIS — F10239 Alcohol dependence with withdrawal, unspecified: Secondary | ICD-10-CM | POA: Diagnosis present

## 2017-12-19 DIAGNOSIS — F429 Obsessive-compulsive disorder, unspecified: Secondary | ICD-10-CM | POA: Diagnosis present

## 2017-12-19 DIAGNOSIS — E876 Hypokalemia: Secondary | ICD-10-CM | POA: Diagnosis present

## 2017-12-19 DIAGNOSIS — K709 Alcoholic liver disease, unspecified: Secondary | ICD-10-CM | POA: Diagnosis present

## 2017-12-19 DIAGNOSIS — F10229 Alcohol dependence with intoxication, unspecified: Secondary | ICD-10-CM | POA: Diagnosis present

## 2017-12-19 DIAGNOSIS — K920 Hematemesis: Secondary | ICD-10-CM | POA: Diagnosis present

## 2017-12-19 DIAGNOSIS — Z9119 Patient's noncompliance with other medical treatment and regimen: Secondary | ICD-10-CM

## 2017-12-19 DIAGNOSIS — E871 Hypo-osmolality and hyponatremia: Secondary | ICD-10-CM | POA: Diagnosis present

## 2017-12-19 DIAGNOSIS — K2921 Alcoholic gastritis with bleeding: Principal | ICD-10-CM

## 2017-12-19 DIAGNOSIS — Z8042 Family history of malignant neoplasm of prostate: Secondary | ICD-10-CM

## 2017-12-19 DIAGNOSIS — F4310 Post-traumatic stress disorder, unspecified: Secondary | ICD-10-CM | POA: Diagnosis present

## 2017-12-19 DIAGNOSIS — I426 Alcoholic cardiomyopathy: Secondary | ICD-10-CM | POA: Diagnosis present

## 2017-12-19 DIAGNOSIS — E785 Hyperlipidemia, unspecified: Secondary | ICD-10-CM | POA: Diagnosis present

## 2017-12-19 DIAGNOSIS — I5022 Chronic systolic (congestive) heart failure: Secondary | ICD-10-CM | POA: Diagnosis present

## 2017-12-19 DIAGNOSIS — G4733 Obstructive sleep apnea (adult) (pediatric): Secondary | ICD-10-CM | POA: Diagnosis present

## 2017-12-19 DIAGNOSIS — L03317 Cellulitis of buttock: Secondary | ICD-10-CM

## 2017-12-19 DIAGNOSIS — Z79899 Other long term (current) drug therapy: Secondary | ICD-10-CM

## 2017-12-19 DIAGNOSIS — F419 Anxiety disorder, unspecified: Secondary | ICD-10-CM | POA: Diagnosis present

## 2017-12-19 DIAGNOSIS — I10 Essential (primary) hypertension: Secondary | ICD-10-CM | POA: Diagnosis present

## 2017-12-19 DIAGNOSIS — F1010 Alcohol abuse, uncomplicated: Secondary | ICD-10-CM

## 2017-12-19 DIAGNOSIS — R740 Nonspecific elevation of levels of transaminase and lactic acid dehydrogenase [LDH]: Secondary | ICD-10-CM

## 2017-12-19 DIAGNOSIS — I110 Hypertensive heart disease with heart failure: Secondary | ICD-10-CM | POA: Diagnosis present

## 2017-12-19 DIAGNOSIS — F17210 Nicotine dependence, cigarettes, uncomplicated: Secondary | ICD-10-CM | POA: Diagnosis present

## 2017-12-19 DIAGNOSIS — K760 Fatty (change of) liver, not elsewhere classified: Secondary | ICD-10-CM | POA: Diagnosis present

## 2017-12-19 DIAGNOSIS — F329 Major depressive disorder, single episode, unspecified: Secondary | ICD-10-CM | POA: Diagnosis present

## 2017-12-19 DIAGNOSIS — R7401 Elevation of levels of liver transaminase levels: Secondary | ICD-10-CM | POA: Diagnosis present

## 2017-12-19 LAB — URINALYSIS, ROUTINE W REFLEX MICROSCOPIC
Bacteria, UA: NONE SEEN
GLUCOSE, UA: NEGATIVE mg/dL
HGB URINE DIPSTICK: NEGATIVE
Ketones, ur: 80 mg/dL — AB
LEUKOCYTES UA: NEGATIVE
NITRITE: NEGATIVE
PH: 7 (ref 5.0–8.0)
PROTEIN: 30 mg/dL — AB
SPECIFIC GRAVITY, URINE: 1.032 — AB (ref 1.005–1.030)

## 2017-12-19 LAB — COMPREHENSIVE METABOLIC PANEL
ALT: 145 U/L — AB (ref 0–44)
AST: 230 U/L — ABNORMAL HIGH (ref 15–41)
Albumin: 4.4 g/dL (ref 3.5–5.0)
Alkaline Phosphatase: 103 U/L (ref 38–126)
Anion gap: 18 — ABNORMAL HIGH (ref 5–15)
BILIRUBIN TOTAL: 3.2 mg/dL — AB (ref 0.3–1.2)
BUN: 21 mg/dL — ABNORMAL HIGH (ref 6–20)
CO2: 19 mmol/L — ABNORMAL LOW (ref 22–32)
CREATININE: 0.84 mg/dL (ref 0.61–1.24)
Calcium: 10 mg/dL (ref 8.9–10.3)
Chloride: 96 mmol/L — ABNORMAL LOW (ref 98–111)
Glucose, Bld: 107 mg/dL — ABNORMAL HIGH (ref 70–99)
POTASSIUM: 3 mmol/L — AB (ref 3.5–5.1)
Sodium: 133 mmol/L — ABNORMAL LOW (ref 135–145)
TOTAL PROTEIN: 7.8 g/dL (ref 6.5–8.1)

## 2017-12-19 LAB — CBC
HEMATOCRIT: 40.3 % (ref 39.0–52.0)
Hemoglobin: 13.7 g/dL (ref 13.0–17.0)
MCH: 31.6 pg (ref 26.0–34.0)
MCHC: 34 g/dL (ref 30.0–36.0)
MCV: 93.1 fL (ref 80.0–100.0)
NRBC: 0 % (ref 0.0–0.2)
Platelets: 44 10*3/uL — ABNORMAL LOW (ref 150–400)
RBC: 4.33 MIL/uL (ref 4.22–5.81)
RDW: 13.8 % (ref 11.5–15.5)
WBC: 5.2 10*3/uL (ref 4.0–10.5)

## 2017-12-19 LAB — LIPASE, BLOOD: LIPASE: 23 U/L (ref 11–51)

## 2017-12-19 LAB — ETHANOL: Alcohol, Ethyl (B): <10 mg/dL (ref <10)

## 2017-12-19 MED ORDER — LIDOCAINE VISCOUS HCL 2 % MT SOLN
15.0000 mL | Freq: Once | OROMUCOSAL | Status: AC
  Administered 2017-12-19: 15 mL via ORAL
  Filled 2017-12-19: qty 15

## 2017-12-19 MED ORDER — THIAMINE HCL 100 MG/ML IJ SOLN
100.0000 mg | Freq: Every day | INTRAMUSCULAR | Status: DC
  Administered 2017-12-20: 100 mg via INTRAVENOUS
  Filled 2017-12-19: qty 2

## 2017-12-19 MED ORDER — SODIUM CHLORIDE 0.9 % IV SOLN
8.0000 mg/h | INTRAVENOUS | Status: AC
  Administered 2017-12-20 (×2): 8 mg/h via INTRAVENOUS
  Filled 2017-12-19 (×9): qty 80

## 2017-12-19 MED ORDER — LORAZEPAM 2 MG/ML IJ SOLN
2.0000 mg | INTRAMUSCULAR | Status: DC | PRN
  Administered 2017-12-20 (×2): 2 mg via INTRAVENOUS
  Filled 2017-12-19 (×2): qty 1

## 2017-12-19 MED ORDER — ALUM & MAG HYDROXIDE-SIMETH 200-200-20 MG/5ML PO SUSP
30.0000 mL | Freq: Once | ORAL | Status: AC
  Administered 2017-12-19: 30 mL via ORAL
  Filled 2017-12-19: qty 30

## 2017-12-19 MED ORDER — PANTOPRAZOLE SODIUM 40 MG IV SOLR: 40.0000 mg | Freq: Two times a day (BID) | INTRAVENOUS | Status: DC

## 2017-12-19 MED ORDER — SODIUM CHLORIDE 0.9 % IV BOLUS
1000.0000 mL | Freq: Once | INTRAVENOUS | Status: AC
  Administered 2017-12-19: 1000 mL via INTRAVENOUS

## 2017-12-19 MED ORDER — SODIUM CHLORIDE 0.9 % IV SOLN
80.0000 mg | Freq: Once | INTRAVENOUS | Status: AC
  Administered 2017-12-20: 80 mg via INTRAVENOUS
  Filled 2017-12-19: qty 80

## 2017-12-19 MED ORDER — IOPAMIDOL (ISOVUE-300) INJECTION 61%
100.0000 mL | Freq: Once | INTRAVENOUS | Status: AC | PRN
  Administered 2017-12-19: 100 mL via INTRAVENOUS

## 2017-12-19 MED ORDER — POTASSIUM CHLORIDE 10 MEQ/100ML IV SOLN
10.0000 meq | INTRAVENOUS | Status: AC
  Administered 2017-12-19 (×2): 10 meq via INTRAVENOUS
  Filled 2017-12-19 (×2): qty 100

## 2017-12-19 MED ORDER — ONDANSETRON HCL 4 MG/2ML IJ SOLN
4.0000 mg | Freq: Once | INTRAMUSCULAR | Status: AC
  Administered 2017-12-19: 4 mg via INTRAVENOUS
  Filled 2017-12-19: qty 2

## 2017-12-19 NOTE — H&P (Signed)
History and Physical    Isiac Edwards NWG:[id number] DOB: [birth year] DOA: 12/19/2017  PCP: Jacquelin Hawking, PA-C   Patient coming from: Home.  I have personally briefly reviewed patient's old medical records in [facility]  Chief Complaint: Vomiting and alcohol intoxication.  HPI: Malik Edwards is a 42 y.o. male with medical history significant of alcohol abuse, anxiety, depression, OCD, PTSD, history of atrial flutter, cardiomyopathy, colitis, hyperlipidemia, hypertension, OSA not on BiPAP who was brought to the emergency department via EMS after having abdominal pain with numerous episodes of vomiting, some of them with coffee grounds since about pain this morning.  He stated that he drank about half 1/5 of a of vodka.  He normally drinks half to 1/5 of vodka a day.  He denies fever, chills, but complains of fatigue and malaise.  He denies headache, sore throat, hemoptysis, dyspnea, chest pain, palpitations, dizziness, diaphoresis, PND, orthopnea or pitting edema of the lower extremities.  Denies diarrhea, constipation, melena or hematochezia.  Denies dysuria, frequency or hematuria.  No polyuria, polydipsia, polyphagia or blurred vision.  ED Course: Initial vital signs temperature 98.1 F, pulse 110, respirations 16, blood pressure 123/89 mmHg and O2 sat 99% on room air.  He received a 1000 mL NS bolus and Zofran 4 mg IVP in the emergency department.  His urinalysis show an amber-colored with hazy appearance, small bilirubin, protein of 30 and ketones of 80 mg/dL.  White count is 5.2, hemoglobin 13.7 and platelets 44.His sodium is 133, potassium 3.0, chloride 96 and CO2 of 19 mmol/L.  BUN was 21, creatinine 0.84 and glucose 107 mg/dL.  Transaminases are elevated AST at 230 and ALT of 145.  Total bilirubin is 3.2 mg/dL.  Alcohol and lipase levels are normal.  Magnesium was 1.6 and phosphorus 1.7 mg/dL.   Imaging: CT abdomen/pelvis did not show any acute abnormalities.  However,  there was colonic diverticulosis and diffuse hepatic steatosis.  Please see images and full radiology report for further detail.  Review of Systems: As per HPI otherwise 10 point review of systems negative.  Past Medical History:  Diagnosis Date  . Alcoholic (HCC)   . Anxiety   . Atrial flutter (HCC) 2013  . Cardiomyopathy (HCC)   . Colitis   . Depression   . High cholesterol   . Hypertension   . OCD (obsessive compulsive disorder)   . OSA (obstructive sleep apnea) 01/22/2013  . PTSD (post-traumatic stress disorder)     Past Surgical History:  Procedure Laterality Date  . CARDIOVERSION N/A 09/15/2012   Procedure: CARDIOVERSION;  Surgeon: Chrystie Nose, MD;  Location: [facility] OR;  Service: Cardiovascular;  Laterality: N/A;     reports that he has been smoking cigarettes. He has a 5.00 pack-year smoking history. He has never used smokeless tobacco. He reports that he drinks about 12.0 standard drinks of alcohol per week. He reports that he does not use drugs.  Allergies  Allergen Reactions  . Gabapentin Swelling  . Statins     Elevated LFTs  (Lipitor specifically)    Family History  Problem Relation Age of Onset  . Healthy Mother   . Healthy Father   . Cancer Father        prostate cancer  . Heart attack Maternal Grandmother   . Heart attack Maternal Grandfather   . Heart attack Paternal Grandmother   . Sudden Cardiac Death Neg Hx    Prior to Admission medications   Medication Sig Start Date End Date  Taking? Authorizing Provider  albuterol (PROVENTIL HFA;VENTOLIN HFA) 108 (90 Base) MCG/ACT inhaler Inhale 2 puffs into the lungs every 4 (four) hours as needed for wheezing or shortness of breath (cough, shortness of breath or wheezing.). Patient not taking: Reported on 12/04/2017 10/25/15   Elvina Sidle, MD  aspirin EC 81 MG EC tablet Take 2 tablets (162 mg total) by mouth daily. For heart health 09/01/15   Armandina Stammer I, NP  carvedilol (COREG) 25 MG tablet Take 1 tablet  (25 mg total) by mouth 2 (two) times daily. 12/04/17   Jacquelin Hawking, PA-C  chlordiazePOXIDE (LIBRIUM) 5 MG capsule Take 4 capsules by mouth daily x2 days; then 3 capsules by mouth daily x2 days; then 2 capsules by mouth daily x3 days; then 1 capsule by mouth daily x3 days and stop Librium. 12/02/17   Vassie Loll, MD  folic acid (FOLVITE) 1 MG tablet Take 1 tablet (1 mg total) by mouth daily. Patient not taking: Reported on 12/04/2017 10/21/17 10/21/18  Vassie Loll, MD  losartan (COZAAR) 50 MG tablet Take 1 tablet (50 mg total) by mouth daily. 12/04/17   Jacquelin Hawking, PA-C  Multiple Vitamin (MULTIVITAMIN WITH MINERALS) TABS tablet Take 1 tablet by mouth daily.    [provider]  nicotine (NICODERM CQ - DOSED IN MG/24 HOURS) 21 mg/24hr patch Place 1 patch (21 mg total) onto the skin daily. Patient not taking: Reported on 12/04/2017 12/02/17   Vassie Loll, MD  Vilazodone HCl (VIIBRYD) 40 MG TABS Take 1 tablet (40 mg total) by mouth daily with breakfast. For OCD/depression 09/01/15   Armandina Stammer I, NP  vitamin B-12 (CYANOCOBALAMIN) 1000 MCG tablet Take 1 tablet (1,000 mcg total) by mouth daily. 12/02/17   Vassie Loll, MD    Physical Exam: Vitals:   12/19/17 2130 12/19/17 2200 12/19/17 2230 12/19/17 2300  BP: 129/88 (!) 147/86 (!) 135/92 (!) 130/93  Pulse: 71 87 74 96  Resp: 17 17 16 18   Temp:      TempSrc:      SpO2: 96% 100% 98% 99%  Weight:      Height:        Constitutional: NAD, calm, comfortable Eyes: PERRL, lids and conjunctivae normal ENMT: Mucous membranes are dry. Posterior pharynx clear of any exudate or lesions.  Poor state of repair or partially absent dentition.  Neck: normal, supple, no masses, no thyromegaly Respiratory: clear to auscultation bilaterally, no wheezing, no crackles. Normal respiratory effort. No accessory muscle use.  Cardiovascular: Regular rate and rhythm, no murmurs / rubs / gallops. No extremity edema. 2+ pedal pulses. No carotid bruits.   Abdomen: Mild epigastric tenderness, no guarding or rebound, no masses palpated. No hepatosplenomegaly. Bowel sounds positive.  Musculoskeletal: no clubbing / cyanosis. . Good ROM, no contractures. Normal muscle tone.  Skin: Multiple areas of ecchymosis.  There is erythema with mild edema, calor and tenderness to palpation in his left gluteal/perianal area.  There is a healing wound on the distal MCP area of his left index finger.  Please see pictures below. Neurologic: CN 2-12 grossly intact. Sensation intact, DTR normal. Strength 5/5 in all 4.  Psychiatric: Normal judgment and insight. Alert and oriented x 3. Normal mood.        Labs on Admission: I have personally reviewed following labs and imaging studies  CBC: Recent Labs  Lab 12/19/17 1841  WBC 5.2  HGB 13.7  HCT 40.3  MCV 93.1  PLT 44*   Basic Metabolic Panel: Recent Labs  Lab  12/19/17 1841  NA 133*  K 3.0*  CL 96*  CO2 19*  GLUCOSE 107*  BUN 21*  CREATININE 0.84  CALCIUM 10.0   GFR: Estimated Creatinine Clearance: 128.3 mL/min (by C-G formula based on SCr of 0.84 mg/dL). Liver Function Tests: Recent Labs  Lab 12/19/17 1841  AST 230*  ALT 145*  ALKPHOS 103  BILITOT 3.2*  PROT 7.8  ALBUMIN 4.4   Recent Labs  Lab 12/19/17 1841  LIPASE 23   No results for input(s): AMMONIA in the last 168 hours. Coagulation Profile: No results for input(s): INR, PROTIME in the last 168 hours. Cardiac Enzymes: No results for input(s): CKTOTAL, CKMB, CKMBINDEX, TROPONINI in the last 168 hours. BNP (last 3 results) No results for input(s): PROBNP in the last 8760 hours. HbA1C: No results for input(s): HGBA1C in the last 72 hours. CBG: No results for input(s): GLUCAP in the last 168 hours. Lipid Profile: No results for input(s): CHOL, HDL, LDLCALC, TRIG, CHOLHDL, LDLDIRECT in the last 72 hours. Thyroid Function Tests: No results for input(s): TSH, T4TOTAL, FREET4, T3FREE, THYROIDAB in the last 72 hours. Anemia  Panel: No results for input(s): VITAMINB12, FOLATE, FERRITIN, TIBC, IRON, RETICCTPCT in the last 72 hours. Urine analysis:    Component Value Date/Time   COLORURINE AMBER (A) 12/19/2017 2205   APPEARANCEUR HAZY (A) 12/19/2017 2205   LABSPEC 1.032 (H) 12/19/2017 2205   PHURINE 7.0 12/19/2017 2205   GLUCOSEU NEGATIVE 12/19/2017 2205   HGBUR NEGATIVE 12/19/2017 2205   BILIRUBINUR SMALL (A) 12/19/2017 2205   KETONESUR 80 (A) 12/19/2017 2205   PROTEINUR 30 (A) 12/19/2017 2205   UROBILINOGEN 0.2 10/14/2008 1919   NITRITE NEGATIVE 12/19/2017 2205   LEUKOCYTESUR NEGATIVE 12/19/2017 2205    Radiological Exams on Admission: Ct Abdomen Pelvis W Contrast  Result Date: 12/19/2017 CLINICAL DATA:  Vomiting. Patient ingested half a fifth of vodka. EXAM: CT ABDOMEN AND PELVIS WITH CONTRAST TECHNIQUE: Multidetector CT imaging of the abdomen and pelvis was performed using the standard protocol following bolus administration of intravenous contrast. CONTRAST:  ISOVUE-300 IOPAMIDOL (ISOVUE-300) INJECTION 61% COMPARISON:  None. FINDINGS: Lower chest: No acute abnormality. Hepatobiliary: Diffuse steatosis of the liver. Gallbladder is distended without mural thickening or calculus. No biliary dilatation or space-occupying mass. There is mild hepatomegaly. Pancreas: Atrophic pancreas without ductal dilatation, mass or inflammation. Spleen: Normal size spleen. Adrenals/Urinary Tract: Adrenal glands are unremarkable. Kidneys are normal, without renal calculi, focal lesion, or hydronephrosis. Bladder is unremarkable. Stomach/Bowel: Stomach is within normal limits. Appendix appears normal. No evidence of bowel wall thickening, distention, or inflammatory changes. Scattered colonic diverticulosis along the descending and sigmoid colon without acute diverticulitis. Vascular/Lymphatic: Mild aortoiliac atherosclerosis. Reproductive: Normal size prostate. Other: No abdominal wall hernia or abnormality. No abdominopelvic  ascites. Musculoskeletal: No acute or significant osseous findings. IMPRESSION: 1. Colonic diverticulosis without acute diverticulitis. 2. No bowel obstruction or inflammation. 3. Diffuse hepatic steatosis. Electronically Signed   By: Tollie Eth M.D.   On: 12/19/2017 22:14    EKG: Independently reviewed.    Assessment/Plan Principal Problem:   Hematemesis Admit to stepdown/inpatient. Keep n.p.o. Continue IV fluids. Continue Protonix infusion. Monitor hematocrit and hemoglobin. Transfuse as needed. Consult gastroenterology.  Active Problems:   Alcohol dependence with acute alcoholic intoxication (HCC) Last drank this morning.  He is no longer intoxicated, but he was initially on arrival.  Will start CIWA protocol, thiamine supplementation IV.  Consider behavioral health consult.    Hypomagnesemia/Hypophosphatemia Replacing. Follow up level as needed.  Cellulitis, gluteal, left Start Ancef 1 g every 8 hours. Continue local care.    HTN (hypertension) Not on antihypertensive therapy. Monitor blood pressure.    Transaminitis Secondary to alcohol consumption. Cessation of alcohol intake is strongly recommended.    Tobacco use Nicotine replacement therapy ordered. Nurse to provide tobacco cessation information.    Thrombocytopenia (HCC) Secondary to alcohol abuse. Monitor platelet level.    DVT prophylaxis: SCDs. Code Status: Full code. Family Communication: Disposition Plan: N.p.o., H&H monitoring, GI evaluation, EtOH withdrawal treatment. Consults called: Gastroenterology for a.m. Admission status: Inpatient/stepdown.   Bobette Mo MD [facility] Pager [phone].  If 7PM-7AM, please contact night-coverage [email or web address] Password [facility]  12/19/2017, 11:54 PM

## 2017-12-19 NOTE — ED Notes (Signed)
ED Provider at bedside. 

## 2017-12-19 NOTE — ED Provider Notes (Signed)
Patient with a history of heavy drinking.  States that last drink was 8:00 this morning.  Late morning he started vomiting coffee-ground emesis is occurred multiple times throughout the day.  Patient's potassium here was 3.0 did have some potassium supplementation.  CO2 was 19.  Patient has not vomited since 1800.  But based on the story feel he needs admission and serial hemoglobin hematocrit to make sure that he remains stable.  Patient has had trouble with alcohol withdrawal in the past but he said surprisingly right now he is not having any issues his heart rate is in the 90s.  But he is at risk for this.  Will call the hospitalist for admission.   Vanetta Mulders, MD 12/19/17 [phone]

## 2017-12-19 NOTE — ED Triage Notes (Signed)
Patient brought in via EMS for emesis, ETOH on board. Patient states he started vomiting about 1000 today. Patient states he has had "a half of a fifth of vodka".

## 2017-12-19 NOTE — ED Provider Notes (Signed)
[facility] EMERGENCY DEPARTMENT Provider Note   CSN: [id number] Arrival date & time: 12/19/17  1748     History   Chief Complaint Chief Complaint  Patient presents with  . Emesis    HPI Malik Edwards is a 42 y.o. male.  HPI Patient presents with multiple episodes of vomiting which started today.  Complains of upper abdominal pain.  No grossly bloody or coffee-ground emesis.  States he chronically drinks alcohol.  Denies NSAID use. Past Medical History:  Diagnosis Date  . Alcoholic (HCC)   . Anxiety   . Atrial flutter (HCC) 2013  . Cardiomyopathy (HCC)   . Colitis   . Depression   . High cholesterol   . Hypertension   . OCD (obsessive compulsive disorder)   . OSA (obstructive sleep apnea) 01/22/2013  . PTSD (post-traumatic stress disorder)     Patient Active Problem List   Diagnosis Date Noted  . Thrombocytopenia (HCC) 12/20/2017  . Hypomagnesemia 12/20/2017  . Cellulitis, gluteal, left 12/20/2017  . Acute alcoholic gastritis with hemorrhage   . Hematemesis 12/19/2017  . Infected finger laceration 12/01/2017  . AKI (acute kidney injury) (HCC)   . Alcohol abuse   . Acute systolic CHF (congestive heart failure) (HCC)   . Bacteremia 10/20/2017  . Alcohol withdrawal (HCC) 10/15/2017  . Alcohol dependence with acute alcoholic intoxication (HCC) 02/20/2017  . Ingestion of substance 02/20/2017  . Severe obesity (BMI 35.0-35.9 with comorbidity) (HCC) 09/01/2016  . Alcohol use disorder, moderate, dependence (HCC) 01/13/2015  . PTSD (post-traumatic stress disorder) 01/13/2015  . 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 OSA (obstructive sleep apnea) 01/22/2013  . Tobacco use 10/15/2012  . Paroxysmal atrial flutter (HCC) 09/13/2012  . Cardiomyopathy- etiol uindetermined- EF 30 to 35% 09/13/2012  . HTN (hypertension) 09/13/2012  . Transaminitis- ?fatty liver, worse when statin added 09/13/2012    Past Surgical  History:  Procedure Laterality Date  . CARDIOVERSION N/A 09/15/2012   Procedure: CARDIOVERSION;  Surgeon: Chrystie Nose, MD;  Location: [facility] OR;  Service: Cardiovascular;  Laterality: N/A;        Home Medications    Prior to Admission medications   Medication Sig Start Date End Date Taking? Authorizing Provider  Cholecalciferol (VITAMIN D) 50 MCG (2000 UT) tablet Take 2,000 Units by mouth daily.   Yes [provider]  lisinopril (PRINIVIL,ZESTRIL) 10 MG tablet Take 10 mg by mouth daily.   Yes [provider]  aspirin EC 81 MG EC tablet Take 2 tablets (162 mg total) by mouth daily. For heart health 09/01/15   Armandina Stammer I, NP  carvedilol (COREG) 25 MG tablet Take 1 tablet (25 mg total) by mouth 2 (two) times daily. 12/04/17   Jacquelin Hawking, PA-C  Multiple Vitamin (MULTIVITAMIN WITH MINERALS) TABS tablet Take 1 tablet by mouth daily.    [provider]  Vilazodone HCl (VIIBRYD) 40 MG TABS Take 1 tablet (40 mg total) by mouth daily with breakfast. For OCD/depression 09/01/15   Sanjuana Kava, NP    Family History Family History  Problem Relation Age of Onset  . Healthy Mother   . Healthy Father   . Cancer Father        prostate cancer  . Heart attack Maternal Grandmother   . Heart attack Maternal Grandfather   . Heart attack Paternal Grandmother   . Sudden Cardiac Death Neg Hx     Social History Social History   Tobacco Use  . Smoking  status: Current Every Day Smoker    Packs/day: 0.25    Years: 20.00    Pack years: 5.00    Types: Cigarettes  . Smokeless tobacco: Never Used  Substance Use Topics  . Alcohol use: Yes    Alcohol/week: 12.0 standard drinks    Types: 12 Standard drinks or equivalent per week    Comment: 1/2 gallon liquor per day. drinks about a fifth about 3 times a week.  . Drug use: No    Comment: denied all drug use     Allergies   Gabapentin and Statins   Review of Systems Review of Systems  Constitutional: Negative  for chills and fever.  HENT: Negative for trouble swallowing.   Eyes: Negative for visual disturbance.  Respiratory: Negative for cough and shortness of breath.   Cardiovascular: Negative for chest pain.  Gastrointestinal: Positive for abdominal pain, nausea and vomiting. Negative for blood in stool, constipation and diarrhea.  Genitourinary: Negative for dysuria, flank pain and frequency.  Musculoskeletal: Negative for back pain, myalgias and neck pain.  Skin: Negative for rash and wound.  Neurological: Negative for dizziness, weakness, light-headedness, numbness and headaches.  All other systems reviewed and are negative.    Physical Exam Updated Vital Signs BP (!) 125/91   Pulse 79   Temp 98.4 F (36.9 C) (Oral)   Resp 16   Ht 5\' 8"  (1.727 m)   Wt 97.5 kg   SpO2 98%   BMI 32.68 kg/m   Physical Exam  Constitutional: He is oriented to person, place, and time. He appears well-developed and well-nourished. No distress.  HENT:  Head: Normocephalic and atraumatic.  Mouth/Throat: Oropharynx is clear and moist.  Right periorbital contusion.  Midface is stable.  Patient does have a small abrasion to the right lateral surface of his tongue.  Eyes: Pupils are equal, round, and reactive to light. EOM are normal.  Neck: Normal range of motion. Neck supple.  No meningismus.  No posterior midline cervical tenderness to palpation.  Cardiovascular: Normal rate and regular rhythm.  Pulmonary/Chest: Effort normal and breath sounds normal.  Abdominal: Soft. Bowel sounds are normal. There is no tenderness. There is no rebound and no guarding.  Epigastric tenderness to palpation.  No rebound or guarding.  Musculoskeletal: Normal range of motion. He exhibits no edema.  Neurological: He is alert and oriented to person, place, and time.  Moving all extremities without focal deficit.  Sensation fully intact.  Skin: Skin is warm and dry. Capillary refill takes less than 2 seconds. No rash noted.  He is not diaphoretic. No erythema.  Psychiatric: He has a normal mood and affect. His behavior is normal.  Nursing note and vitals reviewed.    ED Treatments / Results  Labs (all labs ordered are listed, but only abnormal results are displayed) Labs Reviewed  COMPREHENSIVE METABOLIC PANEL - Abnormal; Notable for the following components:      Result Value   Sodium 133 (*)    Potassium 3.0 (*)    Chloride 96 (*)    CO2 19 (*)    Glucose, Bld 107 (*)    BUN 21 (*)    AST 230 (*)    ALT 145 (*)    Total Bilirubin 3.2 (*)    Anion gap 18 (*)    All other components within normal limits  CBC - Abnormal; Notable for the following components:   Platelets 44 (*)    All other components within normal limits  URINALYSIS, ROUTINE  W REFLEX MICROSCOPIC - Abnormal; Notable for the following components:   Color, Urine AMBER (*)    APPearance HAZY (*)    Specific Gravity, Urine 1.032 (*)    Bilirubin Urine SMALL (*)    Ketones, ur 80 (*)    Protein, ur 30 (*)    All other components within normal limits  MAGNESIUM - Abnormal; Notable for the following components:   Magnesium 1.6 (*)    All other components within normal limits  PHOSPHORUS - Abnormal; Notable for the following components:   Phosphorus 1.7 (*)    All other components within normal limits  CBC WITH DIFFERENTIAL/PLATELET - Abnormal; Notable for the following components:   WBC 3.5 (*)    RBC 3.60 (*)    Hemoglobin 11.4 (*)    HCT 33.9 (*)    Platelets 32 (*)    All other components within normal limits  HEMOGLOBIN - Abnormal; Notable for the following components:   Hemoglobin 11.7 (*)    All other components within normal limits  COMPREHENSIVE METABOLIC PANEL - Abnormal; Notable for the following components:   Sodium 133 (*)    CO2 21 (*)    Calcium 8.7 (*)    Total Protein 6.2 (*)    AST 156 (*)    ALT 110 (*)    Total Bilirubin 2.1 (*)    All other components within normal limits  HEMATOCRIT - Abnormal;  Notable for the following components:   HCT 33.8 (*)    All other components within normal limits  COMPREHENSIVE METABOLIC PANEL - Abnormal; Notable for the following components:   Potassium 3.4 (*)    CO2 21 (*)    Glucose, Bld 129 (*)    Creatinine, Ser 0.53 (*)    Calcium 8.7 (*)    Total Protein 5.8 (*)    Albumin 3.2 (*)    AST 146 (*)    ALT 98 (*)    All other components within normal limits  MAGNESIUM - Abnormal; Notable for the following components:   Magnesium 1.6 (*)    All other components within normal limits  MRSA PCR SCREENING  LIPASE, BLOOD  ETHANOL  PROTIME-INR  MAGNESIUM  CBC WITH DIFFERENTIAL/PLATELET  CBC WITH DIFFERENTIAL/PLATELET    EKG None  Radiology Ct Abdomen Pelvis W Contrast  Result Date: 12/19/2017 CLINICAL DATA:  Vomiting. Patient ingested half a fifth of vodka. EXAM: CT ABDOMEN AND PELVIS WITH CONTRAST TECHNIQUE: Multidetector CT imaging of the abdomen and pelvis was performed using the standard protocol following bolus administration of intravenous contrast. CONTRAST:  ISOVUE-300 IOPAMIDOL (ISOVUE-300) INJECTION 61% COMPARISON:  None. FINDINGS: Lower chest: No acute abnormality. Hepatobiliary: Diffuse steatosis of the liver. Gallbladder is distended without mural thickening or calculus. No biliary dilatation or space-occupying mass. There is mild hepatomegaly. Pancreas: Atrophic pancreas without ductal dilatation, mass or inflammation. Spleen: Normal size spleen. Adrenals/Urinary Tract: Adrenal glands are unremarkable. Kidneys are normal, without renal calculi, focal lesion, or hydronephrosis. Bladder is unremarkable. Stomach/Bowel: Stomach is within normal limits. Appendix appears normal. No evidence of bowel wall thickening, distention, or inflammatory changes. Scattered colonic diverticulosis along the descending and sigmoid colon without acute diverticulitis. Vascular/Lymphatic: Mild aortoiliac atherosclerosis. Reproductive: Normal size  prostate. Other: No abdominal wall hernia or abnormality. No abdominopelvic ascites. Musculoskeletal: No acute or significant osseous findings. IMPRESSION: 1. Colonic diverticulosis without acute diverticulitis. 2. No bowel obstruction or inflammation. 3. Diffuse hepatic steatosis. Electronically Signed   By: Tollie Eth M.D.   On: 12/19/2017  22:14    Procedures Procedures (including critical care time)  Medications Ordered in ED Medications  pantoprazole (PROTONIX) 80 mg in sodium chloride 0.9 % 250 mL (0.32 mg/mL) infusion (0 mg/hr Intravenous Stopped 12/21/17 0001)  LORazepam (ATIVAN) injection 2-3 mg (2 mg Intravenous Given 12/20/17 1729)  thiamine (B-1) injection 100 mg (100 mg Intravenous Given 12/20/17 0904)  0.9 % NaCl with KCl 20 mEq/ L  infusion ( Intravenous New Bag/Given 12/20/17 2201)  polyvinyl alcohol (LIQUIFILM TEARS) 1.4 % ophthalmic solution 1 drop (has no administration in time range)  ceFAZolin (ANCEF) IVPB 1 g/50 mL premix (1 g Intravenous New Bag/Given 12/21/17 0507)  pantoprazole (PROTONIX) injection 40 mg (has no administration in time range)  sodium chloride flush (NS) 0.9 % injection 3 mL (has no administration in time range)  sodium chloride flush (NS) 0.9 % injection 3 mL (has no administration in time range)  sodium chloride 0.9 % bolus 1,000 mL (0 mLs Intravenous Stopped 12/19/17 2109)  ondansetron (ZOFRAN) injection 4 mg (4 mg Intravenous Given 12/19/17 2009)  alum & mag hydroxide-simeth (MAALOX/MYLANTA) 200-200-20 MG/5ML suspension 30 mL (30 mLs Oral Given 12/19/17 2010)    And  lidocaine (XYLOCAINE) 2 % viscous mouth solution 15 mL (15 mLs Oral Given 12/19/17 2010)  potassium chloride 10 mEq in 100 mL IVPB ( Intravenous Stopped 12/19/17 2304)  iopamidol (ISOVUE-300) 61 % injection 100 mL (100 mLs Intravenous Contrast Given 12/19/17 2137)  pantoprazole (PROTONIX) 80 mg in sodium chloride 0.9 % 100 mL IVPB (80 mg Intravenous New Bag/Given 12/20/17 0247)    magnesium sulfate IVPB 2 g 50 mL ( Intravenous Rate/Dose Verify 12/20/17 0400)  potassium chloride 10 mEq in 100 mL IVPB (10 mEq Intravenous New Bag/Given 12/20/17 1247)  potassium PHOSPHATE 30 mmol in dextrose 5 % 500 mL infusion ( Intravenous Rate/Dose Verify 12/20/17 1800)     Initial Impression / Assessment and Plan / ED Course  I have reviewed the triage vital signs and the nursing notes.  Pertinent labs & imaging results that were available during my care of the patient were reviewed by me and considered in my medical decision making (see chart for details).     Signed out to oncoming emergency provider pending CT abdomen and pelvis.  Initiated potassium replacement.  Hemodynamically stable.  Initial hemoglobin is stable.  Final Clinical Impressions(s) / ED Diagnoses   Final diagnoses:  Acute alcoholic gastritis with hemorrhage  Alcohol abuse    ED Discharge Orders    None       Loren Racer, MD 12/21/17 [phone]

## 2017-12-20 ENCOUNTER — Other Ambulatory Visit: Payer: Self-pay

## 2017-12-20 ENCOUNTER — Telehealth: Payer: Self-pay | Admitting: Nurse Practitioner

## 2017-12-20 DIAGNOSIS — K2921 Alcoholic gastritis with bleeding: Secondary | ICD-10-CM

## 2017-12-20 DIAGNOSIS — K920 Hematemesis: Secondary | ICD-10-CM

## 2017-12-20 DIAGNOSIS — F10229 Alcohol dependence with intoxication, unspecified: Secondary | ICD-10-CM

## 2017-12-20 DIAGNOSIS — D696 Thrombocytopenia, unspecified: Secondary | ICD-10-CM | POA: Diagnosis present

## 2017-12-20 DIAGNOSIS — F1010 Alcohol abuse, uncomplicated: Secondary | ICD-10-CM

## 2017-12-20 DIAGNOSIS — R740 Nonspecific elevation of levels of transaminase and lactic acid dehydrogenase [LDH]: Secondary | ICD-10-CM

## 2017-12-20 DIAGNOSIS — L03317 Cellulitis of buttock: Secondary | ICD-10-CM

## 2017-12-20 LAB — CBC WITH DIFFERENTIAL/PLATELET
Abs Immature Granulocytes: 0.02 10*3/uL (ref 0.00–0.07)
BASOS PCT: 0 %
Basophils Absolute: 0 10*3/uL (ref 0.0–0.1)
EOS ABS: 0.1 10*3/uL (ref 0.0–0.5)
Eosinophils Relative: 3 %
HCT: 33.9 % — ABNORMAL LOW (ref 39.0–52.0)
Hemoglobin: 11.4 g/dL — ABNORMAL LOW (ref 13.0–17.0)
IMMATURE GRANULOCYTES: 1 %
Lymphocytes Relative: 23 %
Lymphs Abs: 0.8 10*3/uL (ref 0.7–4.0)
MCH: 31.7 pg (ref 26.0–34.0)
MCHC: 33.6 g/dL (ref 30.0–36.0)
MCV: 94.2 fL (ref 80.0–100.0)
MONO ABS: 0.3 10*3/uL (ref 0.1–1.0)
MONOS PCT: 8 %
NEUTROS PCT: 65 %
Neutro Abs: 2.3 10*3/uL (ref 1.7–7.7)
PLATELETS: 32 10*3/uL — AB (ref 150–400)
RBC: 3.6 MIL/uL — AB (ref 4.22–5.81)
RDW: 14 % (ref 11.5–15.5)
WBC: 3.5 10*3/uL — AB (ref 4.0–10.5)
nRBC: 0 % (ref 0.0–0.2)

## 2017-12-20 LAB — COMPREHENSIVE METABOLIC PANEL
ALK PHOS: 79 U/L (ref 38–126)
ALT: 110 U/L — ABNORMAL HIGH (ref 0–44)
ANION GAP: 11 (ref 5–15)
AST: 156 U/L — ABNORMAL HIGH (ref 15–41)
Albumin: 3.5 g/dL (ref 3.5–5.0)
BUN: 19 mg/dL (ref 6–20)
CALCIUM: 8.7 mg/dL — AB (ref 8.9–10.3)
CO2: 21 mmol/L — AB (ref 22–32)
Chloride: 101 mmol/L (ref 98–111)
Creatinine, Ser: 0.66 mg/dL (ref 0.61–1.24)
GFR calc Af Amer: >60 mL/min (ref >60)
GFR calc non Af Amer: >60 mL/min (ref >60)
GLUCOSE: 92 mg/dL (ref 70–99)
POTASSIUM: 3.5 mmol/L (ref 3.5–5.1)
Sodium: 133 mmol/L — ABNORMAL LOW (ref 135–145)
Total Bilirubin: 2.1 mg/dL — ABNORMAL HIGH (ref 0.3–1.2)
Total Protein: 6.2 g/dL — ABNORMAL LOW (ref 6.5–8.1)

## 2017-12-20 LAB — PROTIME-INR
INR: 0.99
PROTHROMBIN TIME: 13 s (ref 11.4–15.2)

## 2017-12-20 LAB — MRSA PCR SCREENING: MRSA BY PCR: NEGATIVE

## 2017-12-20 LAB — HEMOGLOBIN: Hemoglobin: 11.7 g/dL — ABNORMAL LOW (ref 13.0–17.0)

## 2017-12-20 LAB — MAGNESIUM
MAGNESIUM: 1.6 mg/dL — AB (ref 1.7–2.4)
Magnesium: 2 mg/dL (ref 1.7–2.4)

## 2017-12-20 LAB — HEMATOCRIT: HCT: 33.8 % — ABNORMAL LOW (ref 39.0–52.0)

## 2017-12-20 LAB — PHOSPHORUS: PHOSPHORUS: 1.7 mg/dL — AB (ref 2.5–4.6)

## 2017-12-20 MED ORDER — PANTOPRAZOLE SODIUM 40 MG IV SOLR
INTRAVENOUS | Status: AC
  Filled 2017-12-20: qty 160

## 2017-12-20 MED ORDER — POTASSIUM CHLORIDE IN NACL 20-0.9 MEQ/L-% IV SOLN
INTRAVENOUS | Status: DC
  Administered 2017-12-20 (×2): via INTRAVENOUS

## 2017-12-20 MED ORDER — SODIUM CHLORIDE 0.9 % IV BOLUS: 1000.0000 mL | Freq: Once | INTRAVENOUS | Status: DC

## 2017-12-20 MED ORDER — POTASSIUM CHLORIDE 10 MEQ/100ML IV SOLN
10.0000 meq | INTRAVENOUS | Status: AC
  Administered 2017-12-20 (×4): 10 meq via INTRAVENOUS
  Filled 2017-12-20 (×4): qty 100

## 2017-12-20 MED ORDER — POLYVINYL ALCOHOL 1.4 % OP SOLN
1.0000 [drp] | OPHTHALMIC | Status: DC | PRN
  Filled 2017-12-20: qty 15

## 2017-12-20 MED ORDER — CEFAZOLIN SODIUM-DEXTROSE 1-4 GM/50ML-% IV SOLN
1.0000 g | Freq: Three times a day (TID) | INTRAVENOUS | Status: DC
  Administered 2017-12-20 – 2017-12-21 (×3): 1 g via INTRAVENOUS
  Filled 2017-12-20 (×12): qty 50

## 2017-12-20 MED ORDER — POTASSIUM CHLORIDE IN NACL 40-0.9 MEQ/L-% IV SOLN
INTRAVENOUS | Status: DC
  Administered 2017-12-20: 125 mL/h via INTRAVENOUS
  Filled 2017-12-20: qty 1000

## 2017-12-20 MED ORDER — POTASSIUM PHOSPHATES 15 MMOLE/5ML IV SOLN
30.0000 mmol | Freq: Once | INTRAVENOUS | Status: AC
  Administered 2017-12-20: 30 mmol via INTRAVENOUS
  Filled 2017-12-20: qty 10

## 2017-12-20 MED ORDER — MAGNESIUM SULFATE 2 GM/50ML IV SOLN
2.0000 g | Freq: Once | INTRAVENOUS | Status: AC
  Administered 2017-12-20: 2 g via INTRAVENOUS
  Filled 2017-12-20: qty 50

## 2017-12-20 MED ORDER — PANTOPRAZOLE SODIUM 40 MG IV SOLR
40.0000 mg | Freq: Two times a day (BID) | INTRAVENOUS | Status: DC
  Administered 2017-12-21: 40 mg via INTRAVENOUS
  Filled 2017-12-20: qty 40

## 2017-12-20 NOTE — ED Notes (Signed)
Nurse attempting [place] iv in ed. Per nurse request hold patient until done and bring patient up

## 2017-12-20 NOTE — Progress Notes (Signed)
PROGRESS NOTE    Malik Edwards  ZOX:[id number]  DOB: 1975-12-23  DOA: 12/19/2017 PCP: Jacquelin Hawking, PA-C   Brief Admission Hx: 42 y.o. male with medical history significant of alcohol abuse, anxiety, depression, OCD, PTSD, history of atrial flutter, cardiomyopathy, colitis, hyperlipidemia, hypertension, OSA not on BiPAP who was brought to the emergency department via EMS after having abdominal pain with numerous episodes of vomiting, some of them with coffee grounds since about pain this morning.  He stated that he drank about half 1/5 of a of vodka.  He normally drinks half to 1/5 of vodka a day.    MDM/Assessment & Plan:   1. Hematemesis - Pt was admitted to SDU and started on IV protonix infusion.  NPO advanced to clears.  GI consult requested.  Monitoring H/H.  Pt reports no emesis in last several hours.  Follow in SDU.  2. Chronic alcoholism - He is HIGH RISK for developing acute alcohol withdrawal.  CIWA protocol in place.  3. Hypomagnesemia - IV replacement ordered, Follow.  4. Hypokalemia - IV replacement ordered.  Follow.  5. Left gluteal cellulitis - IV cefazolin ordered.  Follow.  6. Essential hypertension - monitor, currently not on meds.  7. Alcoholic liver disease - chronically elevated transaminases likely related to diffuse steotohepatitis.  8. Tobacco abuse - nicotine patch ordered.  9. Thrombocytopenia - secondary to alcoholic liver disease - holding heparin products. Follow.   DVT prophylaxis: SCDs Code Status: Full  Family Communication: patient  Disposition Plan: home when medically stabilized   Consultants:  GI   Procedures:  N/A  Antimicrobials:  Cefazolin 11/21    Subjective: Pt says that he has not vomited in several hours since admission and wants to start a diet.   Objective: Vitals:   12/20/17 0700 12/20/17 0730 12/20/17 0744 12/20/17 0745  BP: 120/81 118/81  112/86  Pulse: 72 74  73  Resp: 18 19  (!) 21  Temp:   98.5 F (36.9  C)   TempSrc:      SpO2: 98% 99%  97%  Weight:      Height:        Intake/Output Summary (Last 24 hours) at 12/20/2017 0803 Last data filed at 12/20/2017 0400 Gross per 24 hour  Intake 1359.04 ml  Output -  Net 1359.04 ml   Filed Weights   12/19/17 1756 12/20/17 0400  Weight: 97.5 kg 93.4 kg     REVIEW OF SYSTEMS  As per history otherwise all reviewed and reported negative  Exam:  General exam: chronically ill appearing, poorly groomed male, awake, alert, NAD, cooperative, vocal.  Respiratory system: No increased work of breathing. Cardiovascular system: S1 & S2 heard, RRR. No JVD, murmurs, gallops, clicks or pedal edema. Gastrointestinal system: Abdomen is nondistended, soft and nontender. Normal bowel sounds heard. Central nervous system: Alert and oriented. No focal neurological deficits. Extremities: no CCE.  Data Reviewed: Basic Metabolic Panel: Recent Labs  Lab 12/19/17 1841 12/20/17 0633  NA 133* 133*  K 3.0* 3.5  CL 96* 101  CO2 19* 21*  GLUCOSE 107* 92  BUN 21* 19  CREATININE 0.84 0.66  CALCIUM 10.0 8.7*  MG 1.6* 2.0  PHOS 1.7*  --    Liver Function Tests: Recent Labs  Lab 12/19/17 1841 12/20/17 0633  AST 230* 156*  ALT 145* 110*  ALKPHOS 103 79  BILITOT 3.2* 2.1*  PROT 7.8 6.2*  ALBUMIN 4.4 3.5   Recent Labs  Lab 12/19/17 1841  LIPASE 23  No results for input(s): AMMONIA in the last 168 hours. CBC: Recent Labs  Lab 12/19/17 1841 12/20/17 0116 12/20/17 0633  WBC 5.2  --  3.5*  NEUTROABS  --   --  2.3  HGB 13.7 11.7* 11.4*  HCT 40.3 33.8* 33.9*  MCV 93.1  --  94.2  PLT 44*  --  32*   Cardiac Enzymes: No results for input(s): CKTOTAL, CKMB, CKMBINDEX, TROPONINI in the last 168 hours. CBG (last 3)  No results for input(s): GLUCAP in the last 72 hours. No results found for this or any previous visit (from the past 240 hour(s)).   Studies: Ct Abdomen Pelvis W Contrast  Result Date: 12/19/2017 CLINICAL DATA:  Vomiting.  Patient ingested half a fifth of vodka. EXAM: CT ABDOMEN AND PELVIS WITH CONTRAST TECHNIQUE: Multidetector CT imaging of the abdomen and pelvis was performed using the standard protocol following bolus administration of intravenous contrast. CONTRAST:  ISOVUE-300 IOPAMIDOL (ISOVUE-300) INJECTION 61% COMPARISON:  None. FINDINGS: Lower chest: No acute abnormality. Hepatobiliary: Diffuse steatosis of the liver. Gallbladder is distended without mural thickening or calculus. No biliary dilatation or space-occupying mass. There is mild hepatomegaly. Pancreas: Atrophic pancreas without ductal dilatation, mass or inflammation. Spleen: Normal size spleen. Adrenals/Urinary Tract: Adrenal glands are unremarkable. Kidneys are normal, without renal calculi, focal lesion, or hydronephrosis. Bladder is unremarkable. Stomach/Bowel: Stomach is within normal limits. Appendix appears normal. No evidence of bowel wall thickening, distention, or inflammatory changes. Scattered colonic diverticulosis along the descending and sigmoid colon without acute diverticulitis. Vascular/Lymphatic: Mild aortoiliac atherosclerosis. Reproductive: Normal size prostate. Other: No abdominal wall hernia or abnormality. No abdominopelvic ascites. Musculoskeletal: No acute or significant osseous findings. IMPRESSION: 1. Colonic diverticulosis without acute diverticulitis. 2. No bowel obstruction or inflammation. 3. Diffuse hepatic steatosis. Electronically Signed   By: Tollie Eth M.D.   On: 12/19/2017 22:14   Scheduled Meds: . [START ON 12/23/2017] pantoprazole  40 mg Intravenous Q12H  . thiamine injection  100 mg Intravenous Daily   Continuous Infusions: . 0.9 % NaCl with KCl 20 mEq / L 100 mL/hr at 12/20/17 0729  .  ceFAZolin (ANCEF) IV    . pantoprozole (PROTONIX) infusion 8 mg/hr (12/20/17 0400)  . potassium chloride 10 mEq (12/20/17 0741)  . potassium PHOSPHATE IVPB (in mmol)      Principal Problem:   Hematemesis Active  Problems:   HTN (hypertension)   Transaminitis- ?fatty liver, worse when statin added   Tobacco use   Alcohol dependence with acute alcoholic intoxication (HCC)   Thrombocytopenia (HCC)   Hypomagnesemia   Cellulitis, gluteal, left   Critical care Time spent: 32 mins  Standley Dakins, MD, FAAFP [facility] Pager [phone]  If 7PM-7AM, please contact night-coverage [email or web address] Password [email or web address] 12/20/2017, 8:03 AM    LOS: 1 day

## 2017-12-20 NOTE — Telephone Encounter (Signed)
Per [facility] please schedule for outpt EGD on propofol/MAC for inpatient admission with hematemesis in the setting of alcoholism.

## 2017-12-20 NOTE — Consult Note (Signed)
Referring Provider: [facility] Primary Care Physician:  Jacquelin Hawking, PA-C Primary Gastroenterologist:  Dr. Darrick Penna (previously unassigned)  Date of Admission: 12/19/17 Date of Consultation: 12/20/17  Reason for Consultation:  Hematemesis  HPI:  Malik Edwards is a 42 y.o. male with a past medical history of alcoholism, atrial flutter, cardiomyopathy.  Emergency physician notes and hospitalist H&P reviewed.  The patient was brought to the emergency department via EMS after having abdominal pain with numerous episodes of vomiting some of which are described as coffee-ground emesis.  He is an alcoholic and drinks about a one-half to one fifth of vodka a day.  He was intoxicated on presentation.  He was initially tachycardic with stable blood pressure.  CT of the abdomen and pelvis did not have any acute abnormalities other than diffuse hepatic steatosis.  Transaminases were elevated with AST/ALT at 230/145, total bilirubin 3.2, lipase normal, alkaline phosphatase normal.  Kidney function normal.  Elect light derangements with hyponatremia at 133, hypokalemia at 3.0.  BUN was elevated at 21.  His baseline bilirubin is 1.1.  His CBC showed acute on chronic thrombocytopenia with platelet count of 44.  Earlier this month it was 101-207.  He was admitted for further treatment.  He was placed on CIWA protocol and thiamine supplementation, electrolyte replacement, antibiotics for gluteal cellulitis.  Transaminitis and thrombocytopenia deemed due to alcohol consumption.  He was started on IV Protonix infusion, made n.p.o. and advance to clears.  GI was consulted.  As of this morning he had had no emesis in several hours.  His baseline hemoglobin seems to be 12.9-13.7 recently.  His hemoglobin while admitted and subsequently declined to 11.7 and 11.4.  No transfusion as of yet.  No history of endoscopic evaluation in our system.  Today he states he is feeling fine.  Denies abdominal pain.  No  nausea or vomiting in 14 hours.  No previous history of hematochezia, melena, hematemesis that he knows of.  No weakness or fatigue.  Denies chest pain or shortness of breath.  Denies any other GI symptoms.  Past Medical History:  Diagnosis Date  . Alcoholic (HCC)   . Anxiety   . Atrial flutter (HCC) 2013  . Cardiomyopathy (HCC)   . Colitis   . Depression   . High cholesterol   . Hypertension   . OCD (obsessive compulsive disorder)   . OSA (obstructive sleep apnea) 01/22/2013  . PTSD (post-traumatic stress disorder)     Past Surgical History:  Procedure Laterality Date  . CARDIOVERSION N/A 09/15/2012   Procedure: CARDIOVERSION;  Surgeon: Chrystie Nose, MD;  Location: [facility] OR;  Service: Cardiovascular;  Laterality: N/A;    Prior to Admission medications   Medication Sig Start Date End Date Taking? Authorizing Provider  albuterol (PROVENTIL HFA;VENTOLIN HFA) 108 (90 Base) MCG/ACT inhaler Inhale 2 puffs into the lungs every 4 (four) hours as needed for wheezing or shortness of breath (cough, shortness of breath or wheezing.). Patient not taking: Reported on 12/04/2017 10/25/15   Elvina Sidle, MD  aspirin EC 81 MG EC tablet Take 2 tablets (162 mg total) by mouth daily. For heart health 09/01/15   Armandina Stammer I, NP  carvedilol (COREG) 25 MG tablet Take 1 tablet (25 mg total) by mouth 2 (two) times daily. 12/04/17   Jacquelin Hawking, PA-C  chlordiazePOXIDE (LIBRIUM) 5 MG capsule Take 4 capsules by mouth daily x2 days; then 3 capsules by mouth daily x2 days; then 2 capsules by mouth daily x3 days; then 1  capsule by mouth daily x3 days and stop Librium. 12/02/17   Vassie Loll, MD  folic acid (FOLVITE) 1 MG tablet Take 1 tablet (1 mg total) by mouth daily. Patient not taking: Reported on 12/04/2017 10/21/17 10/21/18  Vassie Loll, MD  losartan (COZAAR) 50 MG tablet Take 1 tablet (50 mg total) by mouth daily. 12/04/17   Jacquelin Hawking, PA-C  Multiple Vitamin (MULTIVITAMIN WITH MINERALS) TABS  tablet Take 1 tablet by mouth daily.    [provider]  nicotine (NICODERM CQ - DOSED IN MG/24 HOURS) 21 mg/24hr patch Place 1 patch (21 mg total) onto the skin daily. Patient not taking: Reported on 12/04/2017 12/02/17   Vassie Loll, MD  Vilazodone HCl (VIIBRYD) 40 MG TABS Take 1 tablet (40 mg total) by mouth daily with breakfast. For OCD/depression 09/01/15   Armandina Stammer I, NP  vitamin B-12 (CYANOCOBALAMIN) 1000 MCG tablet Take 1 tablet (1,000 mcg total) by mouth daily. 12/02/17   Vassie Loll, MD    Current Facility-Administered Medications  Medication Dose Route Frequency Provider Last Rate Last Dose  . 0.9 % NaCl with KCl 20 mEq/ L  infusion   Intravenous Continuous Bobette Mo, MD 100 mL/hr at 12/20/17 0729    . ceFAZolin (ANCEF) IVPB 1 g/50 mL premix  1 g Intravenous Q8H Bobette Mo, MD      . LORazepam (ATIVAN) injection 2-3 mg  2-3 mg Intravenous Q1H PRN Bobette Mo, MD      . pantoprazole (PROTONIX) 80 mg in sodium chloride 0.9 % 250 mL (0.32 mg/mL) infusion  8 mg/hr Intravenous Continuous Bobette Mo, MD 25 mL/hr at 12/20/17 0400 8 mg/hr at 12/20/17 0400  . [START ON 12/23/2017] pantoprazole (PROTONIX) injection 40 mg  40 mg Intravenous Q12H Bobette Mo, MD      . polyvinyl alcohol (LIQUIFILM TEARS) 1.4 % ophthalmic solution 1 drop  1 drop Both Eyes PRN Bobette Mo, MD      . potassium chloride 10 mEq in 100 mL IVPB  10 mEq Intravenous Q1 Hr x 4 Johnson, Clanford L, MD 100 mL/hr at 12/20/17 0741 10 mEq at 12/20/17 0741  . potassium PHOSPHATE 30 mmol in dextrose 5 % 500 mL infusion  30 mmol Intravenous Once Bobette Mo, MD      . thiamine (B-1) injection 100 mg  100 mg Intravenous Daily Bobette Mo, MD        Allergies as of 12/19/2017 - Review Complete 12/19/2017  Allergen Reaction Noted  . Gabapentin Swelling 08/20/2015  . Statins  09/13/2012    Family History  Problem Relation Age of Onset  . Healthy  Mother   . Healthy Father   . Cancer Father        prostate cancer  . Heart attack Maternal Grandmother   . Heart attack Maternal Grandfather   . Heart attack Paternal Grandmother   . Sudden Cardiac Death Neg Hx     Social History   Socioeconomic History  . Marital status: Married    Spouse name: Rodarius Kichline  . Number of children: 0  . Years of education: Assoc. Deg  . Highest education level: Not on file  Occupational History  . Occupation: Forensic scientist: OTHER    Comment: Mudlogger  Social Needs  . Financial resource strain: Not on file  . Food insecurity:    Worry: Not on file    Inability: Not on file  . Transportation needs:  Medical: Not on file    Non-medical: Not on file  Tobacco Use  . Smoking status: Current Every Day Smoker    Packs/day: 0.25    Years: 20.00    Pack years: 5.00    Types: Cigarettes  . Smokeless tobacco: Never Used  Substance and Sexual Activity  . Alcohol use: Yes    Alcohol/week: 12.0 standard drinks    Types: 12 Standard drinks or equivalent per week    Comment: 1/2 gallon liquor per day. drinks about a fifth about 3 times a week.  . Drug use: No    Comment: denied all drug use  . Sexual activity: Yes    Partners: Female    Birth control/protection: None    Comment: wife  Lifestyle  . Physical activity:    Days per week: Not on file    Minutes per session: Not on file  . Stress: Not on file  Relationships  . Social connections:    Talks on phone: Not on file    Gets together: Not on file    Attends religious service: Not on file    Active member of club or organization: Not on file    Attends meetings of clubs or organizations: Not on file    Relationship status: Not on file  . Intimate partner violence:    Fear of current or ex partner: Not on file    Emotionally abused: Not on file    Physically abused: Not on file    Forced sexual activity: Not on file  Other Topics Concern  . Not on file  Social  History Narrative   Patient lives at home with spouse.   Caffeine Use: 1-2 cups daily    Review of Systems: General: Negative for anorexia, weight loss, fever, chills, fatigue, weakness. ENT: Negative for hoarseness, difficulty swallowing. CV: Negative for chest pain, angina, palpitations, peripheral edema.  Respiratory: Negative for dyspnea at rest, cough, sputum, wheezing.  GI: See history of present illness. MS: Negative for joint pain, low back pain.  Derm: Negative for rash or itching.  Neuro: Negative for abnormal sensation, memory loss, confusion.  Endo: Negative for unusual weight change.  Heme: Negative for bruising or bleeding. Allergy: Negative for rash or hives.  Physical Exam: Vital signs in last 24 hours: Temp:  [98.1 F (36.7 C)-98.5 F (36.9 C)] 98.5 F (36.9 C) (11/22 0744) Pulse Rate:  [71-110] 73 (11/22 0745) Resp:  [9-21] 21 (11/22 0745) BP: (112-147)/(80-93) 112/86 (11/22 0745) SpO2:  [95 %-100 %] 97 % (11/22 0745) Weight:  [93.4 kg-97.5 kg] 93.4 kg (11/22 0400) Last BM Date: 12/19/17 General:   Alert,  Well-developed, well-nourished, pleasant and cooperative in NAD Eyes:  Sclera clear, no icterus. Conjunctiva pink. Ears:  Normal auditory acuity. Nose:  No deformity, discharge, or lesions. Lungs:  Clear throughout to auscultation.   No wheezes, crackles, or rhonchi. No acute distress. Heart:  Regular rate and rhythm; no murmurs, clicks, rubs,  or gallops. Abdomen:  Soft, nontender and nondistended. No masses, hepatosplenomegaly or hernias noted. Normal bowel sounds, without guarding, and without rebound.   Rectal:  Deferred.   Msk:  Symmetrical without gross deformities. Pulses:  Normal bilateral DP pulses noted. Extremities:  Without clubbing or edema. Neurologic:  Alert and  oriented x4;  grossly normal neurologically. Psych:  Alert and cooperative. Normal mood and affect.  Intake/Output from previous day: 11/21 0701 - 11/22 0700 In: 1359  [I.V.:139.7; IV Piggyback:1219.4] Out: -  Intake/Output this shift: No  intake/output data recorded.  Lab Results: Recent Labs    12/19/17 1841 12/20/17 0116 12/20/17 0633  WBC 5.2  --  3.5*  HGB 13.7 11.7* 11.4*  HCT 40.3 33.8* 33.9*  PLT 44*  --  32*   BMET Recent Labs    12/19/17 1841 12/20/17 0633  NA 133* 133*  K 3.0* 3.5  CL 96* 101  CO2 19* 21*  GLUCOSE 107* 92  BUN 21* 19  CREATININE 0.84 0.66  CALCIUM 10.0 8.7*   LFT Recent Labs    12/19/17 1841 12/20/17 0633  PROT 7.8 6.2*  ALBUMIN 4.4 3.5  AST 230* 156*  ALT 145* 110*  ALKPHOS 103 79  BILITOT 3.2* 2.1*   PT/INR Recent Labs    12/20/17 0118  LABPROT 13.0  INR 0.99   Hepatitis Panel No results for input(s): HEPBSAG, HCVAB, HEPAIGM, HEPBIGM in the last 72 hours. C-Diff No results for input(s): CDIFFTOX in the last 72 hours.  Studies/Results: Ct Abdomen Pelvis W Contrast  Result Date: 12/19/2017 CLINICAL DATA:  Vomiting. Patient ingested half a fifth of vodka. EXAM: CT ABDOMEN AND PELVIS WITH CONTRAST TECHNIQUE: Multidetector CT imaging of the abdomen and pelvis was performed using the standard protocol following bolus administration of intravenous contrast. CONTRAST:  ISOVUE-300 IOPAMIDOL (ISOVUE-300) INJECTION 61% COMPARISON:  None. FINDINGS: Lower chest: No acute abnormality. Hepatobiliary: Diffuse steatosis of the liver. Gallbladder is distended without mural thickening or calculus. No biliary dilatation or space-occupying mass. There is mild hepatomegaly. Pancreas: Atrophic pancreas without ductal dilatation, mass or inflammation. Spleen: Normal size spleen. Adrenals/Urinary Tract: Adrenal glands are unremarkable. Kidneys are normal, without renal calculi, focal lesion, or hydronephrosis. Bladder is unremarkable. Stomach/Bowel: Stomach is within normal limits. Appendix appears normal. No evidence of bowel wall thickening, distention, or inflammatory changes. Scattered colonic diverticulosis  along the descending and sigmoid colon without acute diverticulitis. Vascular/Lymphatic: Mild aortoiliac atherosclerosis. Reproductive: Normal size prostate. Other: No abdominal wall hernia or abnormality. No abdominopelvic ascites. Musculoskeletal: No acute or significant osseous findings. IMPRESSION: 1. Colonic diverticulosis without acute diverticulitis. 2. No bowel obstruction or inflammation. 3. Diffuse hepatic steatosis. Electronically Signed   By: Tollie Eth M.D.   On: 12/19/2017 22:14    Impression: Pleasant 42 year old male who presented to the emergency department intoxicated with a history of significant alcohol intake.  He complained of vomiting with coffee-ground emesis associated with abdominal pain.  He admits to drinking a half of a fifth to an entire fifth of vodka a day.  CT imaging shows hepatic steatosis.  His LFTs were significantly elevated in light of recent significant alcohol intake with AST/ALT at 230/145, total bilirubin 3.2.  Today he is awake and oriented x4.  Electrolytes are being replaced by hospitalist.  Elevated transaminases, acute on chronic thrombocytopenia, electrolyte derangements all likely due to acute alcohol intake.  He is on the [facility] protocol although he is not having signs of withdrawal at this time.  Today he is feeling much better, no nausea or vomiting in the past 14 hours.  No abdominal pain.  He is hungry and wanting to eat.  Plan: 1. Check CBC and CMP in the morning 2. Supportive measures 3. Monitor for any recurrent GI bleed 4. Follow hgb for any decline 5. Advance diet to full liquids for now. 6. May eventually need endoscopic evaluation before discharge, no emergent need at this time. Will discuss with Dr. Darrick Penna about timing. 7. Supportive measures   Thank you for allowing [place] to participate in the care  of Pascal Lux, DNP, AGNP-C Adult & Gerontological Nurse Practitioner [facility]     LOS: 1 day     12/20/2017, 8:44 AM

## 2017-12-21 DIAGNOSIS — Z720 Tobacco use: Secondary | ICD-10-CM

## 2017-12-21 DIAGNOSIS — I10 Essential (primary) hypertension: Secondary | ICD-10-CM

## 2017-12-21 LAB — COMPREHENSIVE METABOLIC PANEL
ALBUMIN: 3.2 g/dL — AB (ref 3.5–5.0)
ALT: 98 U/L — ABNORMAL HIGH (ref 0–44)
ANION GAP: 6 (ref 5–15)
AST: 146 U/L — ABNORMAL HIGH (ref 15–41)
Alkaline Phosphatase: 71 U/L (ref 38–126)
BILIRUBIN TOTAL: 1.1 mg/dL (ref 0.3–1.2)
BUN: 12 mg/dL (ref 6–20)
CO2: 21 mmol/L — ABNORMAL LOW (ref 22–32)
Calcium: 8.7 mg/dL — ABNORMAL LOW (ref 8.9–10.3)
Chloride: 110 mmol/L (ref 98–111)
Creatinine, Ser: 0.53 mg/dL — ABNORMAL LOW (ref 0.61–1.24)
GFR calc Af Amer: >60 mL/min (ref >60)
GFR calc non Af Amer: >60 mL/min (ref >60)
Glucose, Bld: 129 mg/dL — ABNORMAL HIGH (ref 70–99)
POTASSIUM: 3.4 mmol/L — AB (ref 3.5–5.1)
Sodium: 137 mmol/L (ref 135–145)
TOTAL PROTEIN: 5.8 g/dL — AB (ref 6.5–8.1)

## 2017-12-21 LAB — CBC WITH DIFFERENTIAL/PLATELET
ABS IMMATURE GRANULOCYTES: 0.01 10*3/uL (ref 0.00–0.07)
BASOS ABS: 0 10*3/uL (ref 0.0–0.1)
BASOS PCT: 1 %
Eosinophils Absolute: 0.1 10*3/uL (ref 0.0–0.5)
Eosinophils Relative: 5 %
HCT: 31.9 % — ABNORMAL LOW (ref 39.0–52.0)
Hemoglobin: 10.7 g/dL — ABNORMAL LOW (ref 13.0–17.0)
Immature Granulocytes: 1 %
Lymphocytes Relative: 26 %
Lymphs Abs: 0.6 10*3/uL — ABNORMAL LOW (ref 0.7–4.0)
MCH: 32.1 pg (ref 26.0–34.0)
MCHC: 33.5 g/dL (ref 30.0–36.0)
MCV: 95.8 fL (ref 80.0–100.0)
MONO ABS: 0.2 10*3/uL (ref 0.1–1.0)
Monocytes Relative: 11 %
NEUTROS ABS: 1.2 10*3/uL — AB (ref 1.7–7.7)
NRBC: 0 % (ref 0.0–0.2)
Neutrophils Relative %: 56 %
PLATELETS: 47 10*3/uL — AB (ref 150–400)
RBC: 3.33 MIL/uL — AB (ref 4.22–5.81)
RDW: 14.1 % (ref 11.5–15.5)
WBC: 2.1 10*3/uL — AB (ref 4.0–10.5)

## 2017-12-21 LAB — MAGNESIUM: MAGNESIUM: 1.6 mg/dL — AB (ref 1.7–2.4)

## 2017-12-21 MED ORDER — SODIUM CHLORIDE 0.9% FLUSH: 3.0000 mL | INTRAVENOUS | Status: DC | PRN

## 2017-12-21 MED ORDER — POTASSIUM CHLORIDE CRYS ER 20 MEQ PO TBCR
40.0000 meq | EXTENDED_RELEASE_TABLET | Freq: Once | ORAL | Status: AC
  Administered 2017-12-21: 40 meq via ORAL
  Filled 2017-12-21: qty 2

## 2017-12-21 MED ORDER — VITAMIN B-1 100 MG PO TABS: 100.0000 mg | ORAL_TABLET | Freq: Every day | ORAL | 0 refills | Status: AC

## 2017-12-21 MED ORDER — SODIUM CHLORIDE 0.9% FLUSH: 3.0000 mL | Freq: Two times a day (BID) | INTRAVENOUS | Status: DC

## 2017-12-21 MED ORDER — CARVEDILOL 25 MG PO TABS: 12.5000 mg | ORAL_TABLET | Freq: Two times a day (BID) | ORAL | 0 refills | Status: DC

## 2017-12-21 MED ORDER — PANTOPRAZOLE SODIUM 40 MG PO TBEC: 40.0000 mg | DELAYED_RELEASE_TABLET | Freq: Two times a day (BID) | ORAL | 0 refills | Status: DC

## 2017-12-21 MED ORDER — CEPHALEXIN 500 MG PO TABS: 500.0000 mg | ORAL_TABLET | Freq: Three times a day (TID) | ORAL | 0 refills | Status: AC

## 2017-12-21 MED ORDER — FOLIC ACID 1 MG PO TABS: 1.0000 mg | ORAL_TABLET | Freq: Every day | ORAL | 0 refills | Status: AC

## 2017-12-21 MED ORDER — MAGNESIUM SULFATE 2 GM/50ML IV SOLN
2.0000 g | Freq: Once | INTRAVENOUS | Status: AC
  Administered 2017-12-21: 2 g via INTRAVENOUS
  Filled 2017-12-21: qty 50

## 2017-12-21 NOTE — Discharge Summary (Signed)
Physician Discharge Summary  Malik Edwards ZOX:[id number] DOB: 1975/03/09 DOA: 12/19/2017  PCP: Jacquelin Hawking, PA-C [facility]: [facility]: [place]  Admit date: 12/19/2017 Discharge date: 12/21/2017  Admitted From:  Home  Disposition: Home  Recommendations for Outpatient Follow-up:  1. Follow up with PCP in 1 weeks 2. Follow up with GI Dr. Darrick Penna in 2 weeks to discuss outpatient EGD 3. Please obtain BMP/CBC in one week to follow lytes and Hemoglobin 4. Please follow up with cardiology in 2 weeks 5. Please assist with ongoing alcohol cessation efforts.    Discharge Condition: STABLE   CODE STATUS: FULL    Brief Hospitalization Summary: Please see all hospital notes, images, labs for full details of the hospitalization. Dr. Robb Matar HPI: Malik Edwards is a 42 y.o. male with medical history significant of alcohol abuse, anxiety, depression, OCD, PTSD, history of atrial flutter, cardiomyopathy, colitis, hyperlipidemia, hypertension, OSA not on BiPAP who was brought to the emergency department via EMS after having abdominal pain with numerous episodes of vomiting, some of them with coffee grounds since about pain this morning.  He stated that he drank about half 1/5 of a of vodka.  He normally drinks half to 1/5 of vodka a day.  He denies fever, chills, but complains of fatigue and malaise.  He denies headache, sore throat, hemoptysis, dyspnea, chest pain, palpitations, dizziness, diaphoresis, PND, orthopnea or pitting edema of the lower extremities.  Denies diarrhea, constipation, melena or hematochezia.  Denies dysuria, frequency or hematuria.  No polyuria, polydipsia, polyphagia or blurred vision.  ED Course: Initial vital signs temperature 98.1 F, pulse 110, respirations 16, blood pressure 123/89 mmHg and O2 sat 99% on room air.  He received a 1000 mL NS bolus and Zofran 4 mg IVP in the emergency department.  His urinalysis show an amber-colored with hazy appearance, small  bilirubin, protein of 30 and ketones of 80 mg/dL.  White count is 5.2, hemoglobin 13.7 and platelets 44.His sodium is 133, potassium 3.0, chloride 96 and CO2 of 19 mmol/L.  BUN was 21, creatinine 0.84 and glucose 107 mg/dL.  Transaminases are elevated AST at 230 and ALT of 145.  Total bilirubin is 3.2 mg/dL.  Alcohol and lipase levels are normal.  Magnesium was 1.6 and phosphorus 1.7 mg/dL.   Imaging: CT abdomen/pelvis did not show any acute abnormalities.  However, there was colonic diverticulosis and diffuse hepatic steatosis.  Please see images and full radiology report for further detail.  Brief Admission Hx: 42 y.o.malewith medical history significant of alcohol abuse, anxiety, depression, OCD, PTSD, history of atrial flutter, cardiomyopathy, colitis, hyperlipidemia, hypertension, OSA not on BiPAP who was brought to the emergency department via EMS after having abdominal pain with numerous episodes of vomiting, some of them with coffee grounds since about pain this morning. He stated that he drank about half 1/5 of a of vodka. He normally drinks half to 1/5 of vodka a day.   MDM/Assessment & Plan:   1. Nausea and vomiting - likely related to chronic alcohol abuse,  his symptoms quickly resolved and has had no recurrence in over 24 hours.  Pt was admitted to SDU and started on IV protonix infusion.  He has tolerated clears and it was advanced to soft diet by GI team.   GI consulted and they did not feel that he needed inpatient EGD.  They have made arrangements for outpatient EGD under anesthesia which they have arranged for him.  His H/H remained fairly stable. There was some hemodilution from  the IV fluids given but overall patient feels better and wants to go home.  He says that he will follow up as recommended.  He will be discharged with RX for protonix BID.  He should follow up with PCP for recheck of BMP and CBC in 1-2 weeks.  He was strongly advised and counseled to avoid further alcohol  consumption.   2. Chronic alcoholism -  CIWA protocol in place.  His CIWA is down to 0 today.   3. Hypomagnesemia - IV replacement was given prior to discharge.   4. Hypokalemia - IV replacement and oral replacement was given.  5. Left gluteal cellulitis - IV cefazolin ordered.  He was discharged with prescription for cephalexin x 7 days.    6. Essential hypertension - His home coreg was reduced to 12.5 mg BID down from 25 mg BID.   7. Alcoholic liver disease - chronically elevated transaminases likely related to diffuse steotohepatitis and chronic alcohol abuse.  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  He had been on aspirin for anticoagulation.  I have added protonix BID for GI protection and have recommended that he follow up with his cardiologist Dr. Jomarie Longs for recheck in 2 weeks or as soon as he can get an appointment.   9. Tobacco abuse - nicotine patch ordered and patient counseled on smoking cessation.  .  10. Thrombocytopenia - secondary to alcoholic liver disease - avoiding all heparin products  DVT prophylaxis: SCDs Code Status: Full  Family Communication: patient  Disposition Plan: home  Consultants:  GI   Procedures:  N/A  Antimicrobials:  Cefazolin 11/21    Discharge Diagnoses:  Principal Problem:   Hematemesis Active Problems:   HTN (hypertension)   Transaminitis- ?fatty liver, worse when statin added   Tobacco use   Alcohol dependence with acute alcoholic intoxication (HCC)   Thrombocytopenia (HCC)   Hypomagnesemia   Cellulitis, gluteal, left   Acute alcoholic gastritis with hemorrhage  Discharge Instructions: Discharge Instructions    Call MD for:  difficulty breathing, headache or visual disturbances   Complete by:  As directed    Call MD for:  extreme fatigue   Complete by:  As directed    Call MD for:  persistant dizziness or  light-headedness   Complete by:  As directed    Call MD for:  persistant nausea and vomiting   Complete by:  As directed    Call MD for:  severe uncontrolled pain   Complete by:  As directed    Diet - low sodium heart healthy   Complete by:  As directed    Increase activity slowly   Complete by:  As directed      Allergies as of 12/21/2017      Reactions   Gabapentin Swelling   Statins    Elevated LFTs  (Lipitor specifically)      Medication List    TAKE these medications   aspirin 81 MG EC tablet Take 2 tablets (162 mg total) by mouth daily. For heart health   carvedilol 25 MG tablet Commonly known as:  COREG Take 0.5 tablets (12.5 mg total) by mouth 2 (two) times daily. What changed:  how much to take   Cephalexin 500 MG tablet Take 1 tablet (500 mg total) by mouth 3 (three) times daily for 7 days.   folic acid 1 MG tablet Commonly known as:  FOLVITE Take 1 tablet (1 mg total) by mouth daily.   lisinopril 10 MG tablet Commonly known as:  PRINIVIL,ZESTRIL Take 10 mg by mouth daily.   multivitamin with minerals Tabs tablet Take 1 tablet by mouth daily.   pantoprazole 40 MG tablet Commonly known as:  PROTONIX Take 1 tablet (40 mg total) by mouth 2 (two) times daily.   thiamine 100 MG tablet Commonly known as:  VITAMIN B-1 Take 1 tablet (100 mg total) by mouth daily.   Vilazodone HCl 40 MG Tabs Commonly known as:  VIIBRYD Take 1 tablet (40 mg total) by mouth daily with breakfast. For OCD/depression   Vitamin D 50 MCG (2000 UT) tablet Take 2,000 Units by mouth daily.      Follow-up Information    Jacquelin Hawking, PA-C. Schedule an appointment as soon as possible for a visit in 1 week(s).   Specialty:  Physician Assistant Contact information: [address] [place] [address] [phone]        West Bali, MD. Schedule an appointment as soon as possible for a visit in 2 week(s).   Specialty:  Gastroenterology Why:  Hospital Follow Up  for EGD Contact information: [address] [place] [address] [phone]        [place], Rachelle Hora, MD. Schedule an appointment as soon as possible for a visit in 1 week(s).   Specialty:  Cardiology Why:  Hospital Follow Up  Contact information: [address] [place] [address] [phone]          Allergies  Allergen Reactions  . Gabapentin Swelling  . Statins     Elevated LFTs  (Lipitor specifically)   Allergies as of 12/21/2017      Reactions   Gabapentin Swelling   Statins    Elevated LFTs  (Lipitor specifically)      Medication List    TAKE these medications   aspirin 81 MG EC tablet Take 2 tablets (162 mg total) by mouth daily. For heart health   carvedilol 25 MG tablet Commonly known as:  COREG Take 0.5 tablets (12.5 mg total) by mouth 2 (two) times daily. What changed:  how much to take   Cephalexin 500 MG tablet Take 1 tablet (500 mg total) by mouth 3 (three) times daily for 7 days.   folic acid 1 MG tablet Commonly known as:  FOLVITE Take 1 tablet (1 mg total) by mouth daily.   lisinopril 10 MG tablet Commonly known as:  PRINIVIL,ZESTRIL Take 10 mg by mouth daily.   multivitamin with minerals Tabs tablet Take 1 tablet by mouth daily.   pantoprazole 40 MG tablet Commonly known as:  PROTONIX Take 1 tablet (40 mg total) by mouth 2 (two) times daily.   thiamine 100 MG tablet Commonly known as:  VITAMIN B-1 Take 1 tablet (100 mg total) by mouth daily.   Vilazodone HCl 40 MG Tabs Commonly known as:  VIIBRYD Take 1 tablet (40 mg total) by mouth daily with breakfast. For OCD/depression   Vitamin D 50 MCG (2000 UT) tablet Take 2,000 Units by mouth daily.      Procedures/Studies: Dg Chest 2 View  Result Date: 12/01/2017 CLINICAL DATA:  Altered mental status. History of alcoholism and CHF. EXAM: CHEST - 2 VIEW COMPARISON:  Chest radiograph October 15, 2017 FINDINGS: Cardiomediastinal silhouette is normal. No  pleural effusions or focal consolidations. Trachea projects midline and there is no pneumothorax. Soft tissue planes and included osseous structures are non-suspicious. Mild degenerative change  of the spine. IMPRESSION: No acute cardiopulmonary process. Electronically Signed   By: Awilda Metro M.D.   On: 12/01/2017 00:48   Ct Abdomen Pelvis W Contrast  Result Date: 12/19/2017 CLINICAL DATA:  Vomiting. Patient ingested half a fifth of vodka. EXAM: CT ABDOMEN AND PELVIS WITH CONTRAST TECHNIQUE: Multidetector CT imaging of the abdomen and pelvis was performed using the standard protocol following bolus administration of intravenous contrast. CONTRAST:  ISOVUE-300 IOPAMIDOL (ISOVUE-300) INJECTION 61% COMPARISON:  None. FINDINGS: Lower chest: No acute abnormality. Hepatobiliary: Diffuse steatosis of the liver. Gallbladder is distended without mural thickening or calculus. No biliary dilatation or space-occupying mass. There is mild hepatomegaly. Pancreas: Atrophic pancreas without ductal dilatation, mass or inflammation. Spleen: Normal size spleen. Adrenals/Urinary Tract: Adrenal glands are unremarkable. Kidneys are normal, without renal calculi, focal lesion, or hydronephrosis. Bladder is unremarkable. Stomach/Bowel: Stomach is within normal limits. Appendix appears normal. No evidence of bowel wall thickening, distention, or inflammatory changes. Scattered colonic diverticulosis along the descending and sigmoid colon without acute diverticulitis. Vascular/Lymphatic: Mild aortoiliac atherosclerosis. Reproductive: Normal size prostate. Other: No abdominal wall hernia or abnormality. No abdominopelvic ascites. Musculoskeletal: No acute or significant osseous findings. IMPRESSION: 1. Colonic diverticulosis without acute diverticulitis. 2. No bowel obstruction or inflammation. 3. Diffuse hepatic steatosis. Electronically Signed   By: Tollie Eth M.D.   On: 12/19/2017 22:14   Dg Finger Index Left  Result  Date: 12/01/2017 CLINICAL DATA:  Cut finger 1 week ago. EXAM: LEFT INDEX FINGER 2+V COMPARISON:  None. FINDINGS: There is no evidence of fracture or dislocation. There is no evidence of arthropathy or other focal bone abnormality. Distal second finger soft tissue irregularity with minimal focal subcutaneous gas. No radiopaque foreign bodies. IMPRESSION: 1. Distal second finger laceration.  No acute osseous process. Electronically Signed   By: Awilda Metro M.D.   On: 12/01/2017 00:49    Subjective: Pt says that he is feeling much better, no further emesis, and tolerating soft diet well.  No withdrawal symptoms.    Discharge Exam: Vitals:   12/21/17 0600 12/21/17 0731  BP: (!) 125/91   Pulse: 79 98  Resp: 16 17  Temp:  98.9 F (37.2 C)  SpO2: 98% 96%   Vitals:   12/21/17 0415 12/21/17 0500 12/21/17 0600 12/21/17 0731  BP:  (!) 144/94 (!) 125/91   Pulse:  86 79 98  Resp:  12 16 17   Temp: 98.4 F (36.9 C)   98.9 F (37.2 C)  TempSrc: Oral   Oral  SpO2:  92% 98% 96%  Weight:  97.5 kg    Height:       General exam: chronically ill appearing, poorly groomed male, awake, alert, NAD, cooperative, vocal.  Respiratory system: BBS clear to auscultation, No increased work of breathing. Cardiovascular system: S1 & S2 heard, RRR. No JVD, murmurs, gallops, clicks or pedal edema. Gastrointestinal system: Abdomen is nondistended, soft and nontender. Normal bowel sounds heard. Central nervous system: Alert and oriented. No focal neurological deficits. Extremities: no cyanosis or clubbing.   The results of significant diagnostics from this hospitalization (including imaging, microbiology, ancillary and laboratory) are listed below for reference.     Microbiology: Recent Results (from the past 240 hour(s))  MRSA PCR Screening     Status: None   Collection Time: 12/20/17  2:13 AM  Result Value Ref Range Status   MRSA by PCR NEGATIVE NEGATIVE Final    Comment:        The GeneXpert MRSA  Assay (FDA  approved for NASAL specimens only), is one component of a comprehensive MRSA colonization surveillance program. It is not intended to diagnose MRSA infection nor to guide or monitor treatment for MRSA infections. Performed at [facility], [address] [place], [place] [address]      Labs: BNP (last 3 results) No results for input(s): BNP in the last 8760 hours. Basic Metabolic Panel: Recent Labs  Lab 12/19/17 1841 12/20/17 0633 12/21/17 0525  NA 133* 133* 137  K 3.0* 3.5 3.4*  CL 96* 101 110  CO2 19* 21* 21*  GLUCOSE 107* 92 129*  BUN 21* 19 12  CREATININE 0.84 0.66 0.53*  CALCIUM 10.0 8.7* 8.7*  MG 1.6* 2.0 1.6*  PHOS 1.7*  --   --    Liver Function Tests: Recent Labs  Lab 12/19/17 1841 12/20/17 0633 12/21/17 0525  AST 230* 156* 146*  ALT 145* 110* 98*  ALKPHOS 103 79 71  BILITOT 3.2* 2.1* 1.1  PROT 7.8 6.2* 5.8*  ALBUMIN 4.4 3.5 3.2*   Recent Labs  Lab 12/19/17 1841  LIPASE 23   No results for input(s): AMMONIA in the last 168 hours. CBC: Recent Labs  Lab 12/19/17 1841 12/20/17 0116 12/20/17 0633 12/21/17 0525  WBC 5.2  --  3.5* 2.1*  NEUTROABS  --   --  2.3 1.2*  HGB 13.7 11.7* 11.4* 10.7*  HCT 40.3 33.8* 33.9* 31.9*  MCV 93.1  --  94.2 95.8  PLT 44*  --  32* 47*   Cardiac Enzymes: No results for input(s): CKTOTAL, CKMB, CKMBINDEX, TROPONINI in the last 168 hours. BNP: Invalid input(s): POCBNP CBG: No results for input(s): GLUCAP in the last 168 hours. D-Dimer No results for input(s): DDIMER in the last 72 hours. Hgb A1c No results for input(s): HGBA1C in the last 72 hours. Lipid Profile No results for input(s): CHOL, HDL, LDLCALC, TRIG, CHOLHDL, LDLDIRECT in the last 72 hours. Thyroid function studies No results for input(s): TSH, T4TOTAL, T3FREE, THYROIDAB in the last 72 hours.  Invalid input(s): FREET3 Anemia work up No results for input(s): VITAMINB12, FOLATE, FERRITIN, TIBC, IRON, RETICCTPCT in the last 72  hours. Urinalysis    Component Value Date/Time   COLORURINE AMBER (A) 12/19/2017 2205   APPEARANCEUR HAZY (A) 12/19/2017 2205   LABSPEC 1.032 (H) 12/19/2017 2205   PHURINE 7.0 12/19/2017 2205   GLUCOSEU NEGATIVE 12/19/2017 2205   HGBUR NEGATIVE 12/19/2017 2205   BILIRUBINUR SMALL (A) 12/19/2017 2205   KETONESUR 80 (A) 12/19/2017 2205   PROTEINUR 30 (A) 12/19/2017 2205   UROBILINOGEN 0.2 10/14/2008 1919   NITRITE NEGATIVE 12/19/2017 2205   LEUKOCYTESUR NEGATIVE 12/19/2017 2205   Sepsis Labs Invalid input(s): PROCALCITONIN,  WBC,  LACTICIDVEN Microbiology Recent Results (from the past 240 hour(s))  MRSA PCR Screening     Status: None   Collection Time: 12/20/17  2:13 AM  Result Value Ref Range Status   MRSA by PCR NEGATIVE NEGATIVE Final    Comment:        The GeneXpert MRSA Assay (FDA approved for NASAL specimens only), is one component of a comprehensive MRSA colonization surveillance program. It is not intended to diagnose MRSA infection nor to guide or monitor treatment for MRSA infections. Performed at [facility], [address] [place], [place] [address]    Time coordinating discharge: 52 Minutes  SIGNED:  Standley Dakins, MD  [facility] 12/21/2017, 9:26 AM Pager [phone]  If 7PM-7AM, please contact night-coverage [email or web address] Password [email or web address]

## 2017-12-21 NOTE — Discharge Instructions (Signed)
Seek medical care or return to ER if symptoms come back, worsen or new problem develops.     Cellulitis, Adult Cellulitis is a skin infection. The infected area is usually red and sore. This condition occurs most often in the arms and lower legs. It is very important to get treated for this condition. Follow these instructions at home:  Take over-the-counter and prescription medicines only as told by your doctor.  If you were prescribed an antibiotic medicine, take it as told by your doctor. Do not stop taking the antibiotic even if you start to feel better.  Drink enough fluid to keep your pee (urine) clear or pale yellow.  Do not touch or rub the infected area.  Raise (elevate) the infected area above the level of your heart while you are sitting or lying down.  Place warm or cold wet cloths (warm or cold compresses) on the infected area. Do this as told by your doctor.  Keep all follow-up visits as told by your doctor. This is important. These visits let your doctor make sure your infection is not getting worse. Contact a doctor if:  You have a fever.  Your symptoms do not get better after 1-2 days of treatment.  Your bone or joint under the infected area starts to hurt after the skin has healed.  Your infection comes back. This can happen in the same area or another area.  You have a swollen bump in the infected area.  You have new symptoms.  You feel ill and also have muscle aches and pains. Get help right away if:  Your symptoms get worse.  You feel very sleepy.  You throw up (vomit) or have watery poop (diarrhea) for a long time.  There are red streaks coming from the infected area.  Your red area gets larger.  Your red area turns darker. This information is not intended to replace advice given to you by your health care provider. Make sure you discuss any questions you have with your health care provider. Document Released: 07/04/2007 Document Revised:  06/23/2015 Document Reviewed: 11/24/2014 Elsevier Interactive Patient Education  2018 [employer].   Alcohol Abuse and Nutrition Alcohol abuse is any pattern of alcohol consumption that harms your health, relationships, or work. Alcohol abuse can affect how your body breaks down and absorbs nutrients from food by causing your liver to work abnormally. Additionally, many people who abuse alcohol do not eat enough carbohydrates, protein, fat, vitamins, and minerals. This can cause poor nutrition (malnutrition) and a lack of nutrients (nutrient deficiencies), which can lead to further complications. Nutrients that are commonly lacking (deficient) among people who abuse alcohol include:  Vitamins. ? Vitamin A. This is stored in your liver. It is important for your vision, metabolism, and ability to fight off infections (immunity). ? B vitamins. These include vitamins such as folate, thiamin, and niacin. These are important in new cell growth and maintenance. ? Vitamin C. This plays an important role in iron absorption, wound healing, and immunity. ? Vitamin D. This is produced by your liver, but you can also get vitamin D from food. Vitamin D is necessary for your body to absorb and use calcium.  Minerals. ? Calcium. This is important for your bones and your heart and blood vessel (cardiovascular) function. ? Iron. This is important for blood, muscle, and nervous system functioning. ? Magnesium. This plays an important role in muscle and nerve function, and it helps to control blood sugar and blood pressure. ?  Zinc. This is important for the normal function of your nervous system and digestive system (gastrointestinal tract).  Nutrition is an essential component of therapy for alcohol abuse. Your health care provider or dietitian will work with you to design a plan that can help restore nutrients to your body and prevent potential complications. What is my plan? Your dietitian may develop a  specific diet plan that is based on your condition and any other complications you may have. A diet plan will commonly include:  A balanced diet. ? Grains: 6-8 oz per day. ? Vegetables: 2-3 cups per day. ? Fruits: 1-2 cups per day. ? Meat and other protein: 5-6 oz per day. ? Dairy: 2-3 cups per day.  Vitamin and mineral supplements.  What do I need to know about alcohol and nutrition?  Consume foods that are high in antioxidants, such as grapes, berries, nuts, green tea, and dark green and orange vegetables. This can help to counteract some of the stress that is placed on your liver by consuming alcohol.  Avoid food and drinks that are high in fat and sugar. Foods such as sugared soft drinks, salty snack foods, and candy contain empty calories. This means that they lack important nutrients such as protein, fiber, and vitamins.  Eat frequent meals and snacks. Try to eat 5-6 small meals each day.  Eat a variety of fresh fruits and vegetables each day. This will help you get plenty of water, fiber, and vitamins in your diet.  Drink plenty of water and other clear fluids. Try to drink at least 48-64 oz (1.5-2 L) of water per day.  If you are a vegetarian, eat a variety of protein-rich foods. Pair whole grains with plant-based proteins at meals and snacks to obtain the greatest nutrient benefit from your food. For example, eat rice with beans, put peanut butter on whole-grain toast, or eat oatmeal with sunflower seeds.  Soak beans and whole grains overnight before cooking. This can help your body to absorb the nutrients more easily.  Include foods fortified with vitamins and minerals in your diet. Commonly fortified foods include milk, orange juice, cereal, and bread.  If you are malnourished, your dietitian may recommend a high-protein, high-calorie diet. This may include: ? 2,000-3,000 calories (kilocalories) per day. ? 70-100 grams of protein per day.  Your health care provider may  recommend a complete nutritional supplement beverage. This can help to restore calories, protein, and vitamins to your body. Depending on your condition, you may be advised to consume this instead of or in addition to meals.  Limit your intake of caffeine. Replace drinks like coffee and black tea with decaffeinated coffee and herbal tea.  Eat a variety of foods that are high in omega fatty acids. These include fish, nuts and seeds, and soybeans. These foods may help your liver to recover and may also stabilize your mood.  Certain medicines may cause changes in your appetite, taste, and weight. Work with your health care provider and dietitian to make any adjustments to your medicines and diet plan.  Include other healthy lifestyle choices in your daily routine. ? Be physically active. ? Get enough sleep. ? Spend time doing activities that you enjoy.  If you are unable to take in enough food and calories by mouth, your health care provider may recommend a feeding tube. This is a tube that passes through your nose and throat, directly into your stomach. Nutritional supplement beverages can be given to you through the feeding tube  to help you get the nutrients you need.  Take vitamin or mineral supplements as recommended by your health care provider. What foods can I eat? Grains Enriched pasta. Enriched rice. Fortified whole-grain bread. Fortified whole-grain cereal. Barley. Brown rice. Quinoa. Millet. Vegetables All fresh, frozen, and canned vegetables. Spinach. Kale. Artichoke. Carrots. Winter squash and pumpkin. Sweet potatoes. Broccoli. Cabbage. Cucumbers. Tomatoes. Sweet peppers. Green beans. Peas. Corn. Fruits All fresh and frozen fruits. Berries. Grapes. Mango. Papaya. Guava. Cherries. Apples. Bananas. Peaches. Plums. Pineapple. Watermelon. Cantaloupe. Oranges. Avocado. Meats and Other Protein Sources Beef liver. Lean beef. Pork. Fresh and canned chicken. Fresh fish. Oysters. Sardines.  Canned tuna. Shrimp. Eggs with yolks. Nuts and seeds. Peanut butter. Beans and lentils. Soybeans. Tofu. Dairy Whole, low-fat, and nonfat milk. Whole, low-fat, and nonfat yogurt. Cottage cheese. Sour cream. Hard and soft cheeses. Beverages Water. Herbal tea. Decaffeinated coffee. Decaffeinated green tea. 100% fruit juice. 100% vegetable juice. Instant breakfast shakes. Condiments Ketchup. Mayonnaise. Mustard. Salad dressing. Barbecue sauce. Sweets and Desserts Sugar-free ice cream. Sugar-free pudding. Sugar-free gelatin. Fats and Oils Butter. Vegetable oil, flaxseed oil, olive oil, and walnut oil. Other Complete nutrition shakes. Protein bars. Sugar-free gum. The items listed above may not be a complete list of recommended foods or beverages. Contact your dietitian for more options. What foods are not recommended? Grains Sugar-sweetened breakfast cereals. Flavored instant oatmeal. Fried breads. Vegetables Breaded or deep-fried vegetables. Fruits Dried fruit with added sugar. Candied fruit. Canned fruit in syrup. Meats and Other Protein Sources Breaded or deep-fried meats. Dairy Flavored milks. Fried cheese curds or fried cheese sticks. Beverages Alcohol. Sugar-sweetened soft drinks. Sugar-sweetened tea. Caffeinated coffee and tea. Condiments Sugar. Honey. Agave nectar. Molasses. Sweets and Desserts Chocolate. Cake. Cookies. Candy. Other Potato chips. Pretzels. Salted nuts. Candied nuts. The items listed above may not be a complete list of foods and beverages to avoid. Contact your dietitian for more information. This information is not intended to replace advice given to you by your health care provider. Make sure you discuss any questions you have with your health care provider. Document Released: 11/09/2004 Document Revised: 05/25/2015 Document Reviewed: 08/18/2013 Elsevier Interactive Patient Education  2018 [employer].   Alcohol Use Disorder Alcohol use disorder is when  your drinking disrupts your daily life. When you have this condition, you drink too much alcohol and you cannot control your drinking. Alcohol use disorder can cause serious problems with your physical health. It can affect your brain, heart, liver, pancreas, immune system, stomach, and intestines. Alcohol use disorder can increase your risk for certain cancers and cause problems with your mental health, such as depression, anxiety, psychosis, delirium, and dementia. People with this disorder risk hurting themselves and others. What are the causes? This condition is caused by drinking too much alcohol over time. It is not caused by drinking too much alcohol only one or two times. Some people with this condition drink alcohol to cope with or escape from negative life events. Others drink to relieve pain or symptoms of mental illness. What increases the risk? You are more likely to develop this condition if:  You have a family history of alcohol use disorder.  Your culture encourages drinking to the point of intoxication, or makes alcohol easy to get.  You had a mood or conduct disorder in childhood.  You have been a victim of abuse.  You are an adolescent and: ? You have poor grades or difficulties in school. ? Your caregivers do not talk to you about saying  no to alcohol, or supervise your activities. ? You are impulsive or you have trouble with self-control.  What are the signs or symptoms? Symptoms of this condition include:  Drinkingmore than you want to.  Drinking for longer than you want to.  Trying several times to drink less or to control your drinking.  Spending a lot of time getting alcohol, drinking, or recovering from drinking.  Craving alcohol.  Having problems at work, at school, or at home due to drinking.  Having problems in relationships due to drinking.  Drinking when it is dangerous to drink, such as before driving a car.  Continuing to drink even though you  know you might have a physical or mental problem related to drinking.  Needing more and more alcohol to get the same effect you want from the alcohol (building up tolerance).  Having symptoms of withdrawal when you stop drinking. Symptoms of withdrawal include: ? Fatigue. ? Nightmares. ? Trouble sleeping. ? Depression. ? Anxiety. ? Fever. ? Seizures. ? Severe confusion. ? Feeling or seeing things that are not there (hallucinations). ? Tremors. ? Rapid heart rate. ? Rapid breathing. ? High blood pressure.  Drinking to avoid symptoms of withdrawal.  How is this diagnosed? This condition is diagnosed with an assessment. Your health care provider may start the assessment by asking three or four questions about your drinking. Your health care provider may perform a physical exam or do lab tests to see if you have physical problems resulting from alcohol use. She or he may refer you to a mental health professional for evaluation. How is this treated? Some people with alcohol use disorder are able to reduce their alcohol use to low-risk levels. Others need to completely quit drinking alcohol. When necessary, mental health professionals with specialized training in substance use treatment can help. Your health care provider can help you decide how severe your alcohol use disorder is and what type of treatment you need. The following forms of treatment are available:  Detoxification. Detoxification involves quitting drinking and using prescription medicines within the first week to help lessen withdrawal symptoms. This treatment is important for people who have had withdrawal symptoms before and for heavy drinkers who are likely to have withdrawal symptoms. Alcohol withdrawal can be dangerous, and in severe cases, it can cause death. Detoxification may be provided in a home, community, or primary care setting, or in a hospital or substance use treatment facility.  Counseling. This treatment is  also called talk therapy. It is provided by substance use treatment counselors. A counselor can address the reasons you use alcohol and suggest ways to keep you from drinking again or to prevent problem drinking. The goals of talk therapy are to: ? Find healthy activities and ways for you to cope with stress. ? Identify and avoid the things that trigger your alcohol use. ? Help you learn how to handle cravings.  Medicines.Medicines can help treat alcohol use disorder by: ? Decreasing alcohol cravings. ? Decreasing the positive feeling you have when you drink alcohol. ? Causing an uncomfortable physical reaction when you drink alcohol (aversion therapy).  Support groups. Support groups are led by people who have quit drinking. They provide emotional support, advice, and guidance.  These forms of treatment are often combined. Some people with this condition benefit from a combination of treatments provided by specialized substance use treatment centers. Follow these instructions at home:  Take over-the-counter and prescription medicines only as told by your health care provider.  Check  with your health care provider before starting any new medicines.  Ask friends and family members not to offer you alcohol.  Avoid situations where alcohol is served, including gatherings where others are drinking alcohol.  Create a plan for what to do when you are tempted to use alcohol.  Find hobbies or activities that you enjoy that do not include alcohol.  Keep all follow-up visits as told by your health care provider. This is important. How is this prevented?  If you drink, limit alcohol intake to no more than 1 drink a day for nonpregnant women and 2 drinks a day for men. One drink equals 12 oz of beer, 5 oz of wine, or 1 oz of hard liquor.  If you have a mental health condition, get treatment and support.  Do not give alcohol to adolescents.  If you are an adolescent: ? Do not drink  alcohol. ? Do not be afraid to say no if someone offers you alcohol. Speak up about why you do not want to drink. You can be a positive role model for your friends and set a good example for those around you by not drinking alcohol. ? If your friends drink, spend time with others who do not drink alcohol. Make new friends who do not use alcohol. ? Find healthy ways to manage stress and emotions, such as meditation or deep breathing, exercise, spending time in nature, listening to music, or talking with a trusted friend or family member. Contact a health care provider if:  You are not able to take your medicines as told.  Your symptoms get worse.  You return to drinking alcohol (relapse) and your symptoms get worse. Get help right away if:  You have thoughts about hurting yourself or others. If you ever feel like you may hurt yourself or others, or have thoughts about taking your own life, get help right away. You can go to your nearest emergency department or call:  Your local emergency services (911 in the [place]  A suicide crisis helpline, such as the National Suicide Prevention Lifeline at 8048515877. This is open 24 hours a day.  Summary  Alcohol use disorder is when your drinking disrupts your daily life. When you have this condition, you drink too much alcohol and you cannot control your drinking.  Treatment may include detoxification, counseling, medicine, and support groups.  Ask friends and family members not to offer you alcohol. Avoid situations where alcohol is served.  Get help right away if you have thoughts about hurting yourself or others. This information is not intended to replace advice given to you by your health care provider. Make sure you discuss any questions you have with your health care provider. Document Released: 02/23/2004 Document Revised: 10/13/2015 Document Reviewed: 10/13/2015 Elsevier Interactive Patient Education  2018 [employer].   Recovering From Addiction Addiction is a complex disease of the brain. It causes an uncontrollable (compulsive) need for a substance. You can be addicted to substances including alcohol, tobacco, illegal drugs, or prescription medicines such as painkillers. Addiction can change the way that your brain works. It affects memory, behavior, and how you make decisions. Without treatment, addiction can get worse. However, with treatment and lifestyle changes, you can recover from addiction. What types of treatment are available? The treatment program that is right for you will depend on many factors, including the type of addiction that you have. Treatment programs can be outpatient or inpatient. In an outpatient program, you live at home  and go to work or school, but you also go to a clinic for treatment. With an inpatient program, you live and sleep at the program facility during treatment. Other treatment options include:  Medicine. ? Some addictions may be treated with prescription medicines. ? You may also need medicine to treat other mental health conditions such as anxiety or depression.  Counseling and behavior therapy. Therapy can help you learn new ways to respond to situations that are stressful or that tempt you to use the addictive substance.  Support groups. These include therapy groups and 12-step programs. These can help individuals and families during treatment and recovery. Examples of 12-step programs are Alcoholics Anonymous (AA) and Narcotics Anonymous (NA).  How to manage lifestyle changes Managing stress Too much stress can lead to returning to the addiction (having a relapse). You need to find effective ways to manage your stress. Some techniques to cope with stress include:  Meditation, yoga, or deep breathing.  Exercise. Create an exercise routine that you enjoy and that allows you to work off some energy.  Creating or listening to music.  Muscle relaxation  exercises.  Medicines Some medicines may make you feel calmer and help you have fewer cravings. If your health care provider prescribes medicines, make sure you:  Avoid using alcohol and other substances that may prevent your medicines from working properly (may interact).  Talk with your pharmacist or health care provider about all medicines that you take, the possible problems (side effects) that they can cause, and which medicines are safe to take together.  Make it your goal to take part in all treatment decisions (shared decision-making). Ask about possible side effects of medicines that your health care provider recommends, and tell him or her how you feel about having those side effects. It is best if shared decision-making with your health care provider is part of your total treatment plan.  Relationships Supportive relationships are very important in your recovery. When you are recovering from drug addiction, it will be important to avoid being around people who use drugs. For many people, this means developing new and different relationships. Some ways to do this include:  Developing trusting relationships with the people you meet in treatment or in AA or NA. These people share your desire to stop using substances (get sober) and to stay sober.  Getting a sponsor as a primary support person if you are attending a 12-step program.  Building relationships with people you meet through activities such as hobbies, volunteering, or exercising.  How to recognize changes in your condition When recovering from an addiction, it is very common for a person to relapse and start using the substance again. Contact your sponsor, therapist, or health care provider to seek additional help if you experience the following:  Anxiety.  Excessive anger.  Isolating yourself from others.  Trouble sleeping.  Feeling depressed.  Loss of appetite.  Fantasies about using the substance.  Where to  find support Talking to others  You may be advised to see a family therapist along with members of your family. Family therapy can help you and your family understand what led you to addiction. Talk with your family about this approach.  Let your family members or friends know that they can help you through treatment. Support from loved ones will be important to help you maintain positive changes. Financial resources Be sure to check with your insurance carrier to find out what treatment options are covered by your plan. You  may also be able to find financial assistance through not-for-profit organizations or with local government-based resources. If you are taking medicines, you may be able to get the generic form, which may be less expensive than brand-name medicine. Some makers of prescription medicines also offer help to patients who cannot afford the medicines that they need. Follow these instructions at home:  Take over-the-counter and prescription medicines only as told by your health care provider.  Stay in treatment until you complete the program. Take an active role in your treatment and your physical and emotional self-care. Develop a follow-up plan.  Keep all follow-up visits as told by your health care provider and counselor. This is important.  Eat a healthy diet, exercise regularly, and get enough sleep. Questions to ask your health care provider  If you are taking medicines: ? How long do I need to take medicine? ? Are there any long-term side effects of my medicine? ? Are there other options instead of taking medicine?  Would I benefit from therapy?  How often should I follow up with a health care provider? Contact a health care provider if:  You feel like you might relapse.  You have stopped taking your medicine. Get help right away if:  You have serious thoughts about hurting yourself or others. If you ever feel like you may hurt yourself or others, or have  thoughts about taking your own life, get help right away. You can go to your nearest emergency department or call:  Your local emergency services (911 in the [place]  A suicide crisis helpline, such as the National Suicide Prevention Lifeline at (548) 227-8652. This is open 24 hours a day.  Summary  With treatment and lifestyle changes, it is possible to recover from an addiction to substances like alcohol, tobacco, illegal drugs, or prescription medicines such as painkillers.  Find effective ways to manage your stress to avoid a relapse. Some techniques to cope with stress include exercise, meditation, yoga, and deep breathing.  Let loved ones know that their support is important to help you recover.  If you have any signs that you may relapse, contact your 12-step sponsor, therapist, or health care provider to seek additional help. This information is not intended to replace advice given to you by your health care provider. Make sure you discuss any questions you have with your health care provider. Document Released: 06/01/2016 Document Revised: 06/01/2016 Document Reviewed: 06/01/2016 Elsevier Interactive Patient Education  2018 [employer]   Gastritis, Adult Gastritis is inflammation of the stomach. There are two kinds of gastritis:  Acute gastritis. This kind develops suddenly.  Chronic gastritis. This kind lasts for a long time.  Gastritis happens when the lining of the stomach becomes weak or gets damaged. Without treatment, gastritis can lead to stomach bleeding and ulcers. What are the causes? This condition may be caused by:  An infection.  Drinking too much alcohol.  Certain medicines.  Having too much acid in the stomach.  A disease of the intestines or stomach.  Stress.  What are the signs or symptoms? Symptoms of this condition include:  Pain or a burning in the upper abdomen.  Nausea.  Vomiting.  An uncomfortable feeling of fullness after  eating.  In some cases, there are no symptoms. How is this diagnosed? This condition may be diagnosed with:  A description of your symptoms.  A physical exam.  Tests. These can include: ? Blood tests. ? Stool tests. ? A test in which a thin,  flexible instrument with a light and camera on the end is passed down the esophagus and into the stomach (upper endoscopy). ? A test in which a sample of tissue is taken for testing (biopsy).  How is this treated? This condition may be treated with medicines. If the condition is caused by a bacterial infection, you may be given antibiotic medicines. If it is caused by too much acid in the stomach, you may get medicines called H2 blockers, proton pump inhibitors, or antacids. Treatment may also involve stopping the use of certain medicines, such as aspirin, ibuprofen, or other nonsteroidal anti-inflammatory drugs (NSAIDs). Follow these instructions at home:  Take over-the-counter and prescription medicines only as told by your health care provider.  If you were prescribed an antibiotic, take it as told by your health care provider. Do not stop taking the antibiotic even if you start to feel better.  Drink enough fluid to keep your urine clear or pale yellow.  Eat small, frequent meals instead of large meals. Contact a health care provider if:  Your symptoms get worse.  Your symptoms return after treatment. Get help right away if:  You vomit blood or material that looks like coffee grounds.  You have black or dark red stools.  You are unable to keep fluids down.  Your abdominal pain gets worse.  You have a fever.  You do not feel better after 1 week. This information is not intended to replace advice given to you by your health care provider. Make sure you discuss any questions you have with your health care provider. Document Released: 01/09/2001 Document Revised: 09/14/2015 Document Reviewed: 10/09/2014 Elsevier Interactive Patient  Education  [employer].

## 2017-12-21 NOTE — Progress Notes (Signed)
Gave discharge paperwork. Refused other morning meds. IV access removed without difficulty. Patient stated he was going to call for his ride. He stated he wanted to walk out and said he will call an uber when he gets down stairs. He then left on his own power

## 2017-12-23 NOTE — Telephone Encounter (Signed)
Called pt, informed him of [facility]'s recommendation. He is in the process of open enrollment for the affordable care act. He wants to wait until after he enrolls to schedule procedure. He will call our office when he's ready to proceed.

## 2018-01-02 ENCOUNTER — Ambulatory Visit: Payer: Self-pay | Admitting: Physician Assistant

## 2018-01-06 ENCOUNTER — Other Ambulatory Visit: Payer: Self-pay | Admitting: Cardiovascular Disease

## 2018-01-08 NOTE — Telephone Encounter (Signed)
Rx request sent to pharmacy.  

## 2018-01-14 NOTE — Telephone Encounter (Signed)
Letter mailed

## 2018-02-07 ENCOUNTER — Ambulatory Visit: Payer: Self-pay | Admitting: Physician Assistant

## 2018-04-10 DIAGNOSIS — F4310 Post-traumatic stress disorder, unspecified: Secondary | ICD-10-CM | POA: Diagnosis not present

## 2018-04-10 DIAGNOSIS — F4481 Dissociative identity disorder: Secondary | ICD-10-CM | POA: Diagnosis not present

## 2018-04-11 DIAGNOSIS — F4310 Post-traumatic stress disorder, unspecified: Secondary | ICD-10-CM | POA: Diagnosis not present

## 2018-04-11 DIAGNOSIS — F4481 Dissociative identity disorder: Secondary | ICD-10-CM | POA: Diagnosis not present

## 2018-04-15 DIAGNOSIS — F4310 Post-traumatic stress disorder, unspecified: Secondary | ICD-10-CM | POA: Diagnosis not present

## 2018-04-15 DIAGNOSIS — F4481 Dissociative identity disorder: Secondary | ICD-10-CM | POA: Diagnosis not present

## 2018-04-16 DIAGNOSIS — F4310 Post-traumatic stress disorder, unspecified: Secondary | ICD-10-CM | POA: Diagnosis not present

## 2018-04-16 DIAGNOSIS — F4481 Dissociative identity disorder: Secondary | ICD-10-CM | POA: Diagnosis not present

## 2018-05-02 ENCOUNTER — Encounter (HOSPITAL_COMMUNITY): Payer: Self-pay | Admitting: Emergency Medicine

## 2018-05-02 ENCOUNTER — Emergency Department (HOSPITAL_COMMUNITY)
Admission: EM | Admit: 2018-05-02 | Discharge: 2018-05-03 | Disposition: A | Discharge status: Other Acute Inpatient Hospital | Payer: BC Managed Care – PPO | Attending: Emergency Medicine | Admitting: Emergency Medicine

## 2018-05-02 ENCOUNTER — Other Ambulatory Visit: Payer: Self-pay

## 2018-05-02 DIAGNOSIS — Y908 Blood alcohol level of 240 mg/100 ml or more: Secondary | ICD-10-CM | POA: Insufficient documentation

## 2018-05-02 DIAGNOSIS — I110 Hypertensive heart disease with heart failure: Secondary | ICD-10-CM | POA: Insufficient documentation

## 2018-05-02 DIAGNOSIS — I959 Hypotension, unspecified: Secondary | ICD-10-CM | POA: Diagnosis not present

## 2018-05-02 DIAGNOSIS — R000 Tachycardia, unspecified: Secondary | ICD-10-CM | POA: Diagnosis not present

## 2018-05-02 DIAGNOSIS — F29 Unspecified psychosis not due to a substance or known physiological condition: Secondary | ICD-10-CM | POA: Diagnosis not present

## 2018-05-02 DIAGNOSIS — Z046 Encounter for general psychiatric examination, requested by authority: Secondary | ICD-10-CM | POA: Diagnosis not present

## 2018-05-02 DIAGNOSIS — I5020 Unspecified systolic (congestive) heart failure: Secondary | ICD-10-CM | POA: Insufficient documentation

## 2018-05-02 DIAGNOSIS — Z79899 Other long term (current) drug therapy: Secondary | ICD-10-CM | POA: Diagnosis not present

## 2018-05-02 DIAGNOSIS — F1010 Alcohol abuse, uncomplicated: Secondary | ICD-10-CM | POA: Insufficient documentation

## 2018-05-02 DIAGNOSIS — F329 Major depressive disorder, single episode, unspecified: Secondary | ICD-10-CM | POA: Insufficient documentation

## 2018-05-02 DIAGNOSIS — R45851 Suicidal ideations: Secondary | ICD-10-CM

## 2018-05-02 DIAGNOSIS — I10 Essential (primary) hypertension: Secondary | ICD-10-CM | POA: Diagnosis not present

## 2018-05-02 DIAGNOSIS — F17210 Nicotine dependence, cigarettes, uncomplicated: Secondary | ICD-10-CM | POA: Diagnosis not present

## 2018-05-02 DIAGNOSIS — F10929 Alcohol use, unspecified with intoxication, unspecified: Secondary | ICD-10-CM | POA: Diagnosis not present

## 2018-05-02 DIAGNOSIS — Z7289 Other problems related to lifestyle: Secondary | ICD-10-CM | POA: Diagnosis not present

## 2018-05-02 LAB — CBC
HCT: 45.1 % (ref 39.0–52.0)
Hemoglobin: 15.2 g/dL (ref 13.0–17.0)
MCH: 31.7 pg (ref 26.0–34.0)
MCHC: 33.7 g/dL (ref 30.0–36.0)
MCV: 94 fL (ref 80.0–100.0)
Platelets: 118 10*3/uL — ABNORMAL LOW (ref 150–400)
RBC: 4.8 MIL/uL (ref 4.22–5.81)
RDW: 15.2 % (ref 11.5–15.5)
WBC: 8.4 10*3/uL (ref 4.0–10.5)
nRBC: 0 % (ref 0.0–0.2)

## 2018-05-02 LAB — COMPREHENSIVE METABOLIC PANEL
ALT: 155 U/L — ABNORMAL HIGH (ref 0–44)
AST: 363 U/L — ABNORMAL HIGH (ref 15–41)
Albumin: 4.4 g/dL (ref 3.5–5.0)
Alkaline Phosphatase: 76 U/L (ref 38–126)
Anion gap: >20 — ABNORMAL HIGH (ref 5–15)
BUN: 20 mg/dL (ref 6–20)
CO2: 18 mmol/L — ABNORMAL LOW (ref 22–32)
Calcium: 8.3 mg/dL — ABNORMAL LOW (ref 8.9–10.3)
Chloride: 96 mmol/L — ABNORMAL LOW (ref 98–111)
Creatinine, Ser: 0.75 mg/dL (ref 0.61–1.24)
GFR calc Af Amer: >60 mL/min (ref >60)
GFR calc non Af Amer: >60 mL/min (ref >60)
Glucose, Bld: 93 mg/dL (ref 70–99)
Potassium: 4 mmol/L (ref 3.5–5.1)
Sodium: 137 mmol/L (ref 135–145)
Total Bilirubin: 2 mg/dL — ABNORMAL HIGH (ref 0.3–1.2)
Total Protein: 8 g/dL (ref 6.5–8.1)

## 2018-05-02 LAB — RAPID URINE DRUG SCREEN, HOSP PERFORMED
Amphetamines: NOT DETECTED
Barbiturates: NOT DETECTED
Benzodiazepines: NOT DETECTED
Cocaine: NOT DETECTED
Opiates: NOT DETECTED
Tetrahydrocannabinol: NOT DETECTED

## 2018-05-02 LAB — ETHANOL: Alcohol, Ethyl (B): 311 mg/dL (ref <10)

## 2018-05-02 LAB — SALICYLATE LEVEL: Salicylate Lvl: <7 mg/dL (ref 2.8–30.0)

## 2018-05-02 LAB — ACETAMINOPHEN LEVEL: Acetaminophen (Tylenol), Serum: <10 ug/mL — ABNORMAL LOW (ref 10–30)

## 2018-05-02 MED ORDER — HYDROXYZINE HCL 25 MG PO TABS
50.0000 mg | ORAL_TABLET | Freq: Once | ORAL | Status: AC
  Administered 2018-05-02: 22:00:00 50 mg via ORAL

## 2018-05-02 MED ORDER — PROMETHAZINE HCL 25 MG/ML IJ SOLN
25.0000 mg | Freq: Once | INTRAMUSCULAR | Status: AC
  Administered 2018-05-02: 22:00:00 25 mg via INTRAVENOUS
  Filled 2018-05-02: qty 1

## 2018-05-02 MED ORDER — THIAMINE HCL 100 MG/ML IJ SOLN
100.0000 mg | Freq: Every day | INTRAMUSCULAR | Status: DC
  Administered 2018-05-02: 100 mg via INTRAVENOUS
  Filled 2018-05-02: qty 2

## 2018-05-02 MED ORDER — VITAMIN B-1 100 MG PO TABS
100.0000 mg | ORAL_TABLET | Freq: Every day | ORAL | Status: DC
  Filled 2018-05-02: qty 1

## 2018-05-02 MED ORDER — ONDANSETRON HCL 4 MG PO TABS: 4.0000 mg | ORAL_TABLET | Freq: Three times a day (TID) | ORAL | Status: DC | PRN

## 2018-05-02 MED ORDER — ONDANSETRON HCL 4 MG/2ML IJ SOLN
4.0000 mg | Freq: Once | INTRAMUSCULAR | Status: AC
  Administered 2018-05-02: 4 mg via INTRAVENOUS

## 2018-05-02 MED ORDER — LORAZEPAM 1 MG PO TABS: 0.0000 mg | ORAL_TABLET | Freq: Two times a day (BID) | ORAL | Status: DC

## 2018-05-02 MED ORDER — CARVEDILOL 12.5 MG PO TABS
25.0000 mg | ORAL_TABLET | Freq: Two times a day (BID) | ORAL | Status: DC
  Administered 2018-05-02: 22:00:00 25 mg via ORAL
  Filled 2018-05-02: qty 2

## 2018-05-02 MED ORDER — ALUM & MAG HYDROXIDE-SIMETH 200-200-20 MG/5ML PO SUSP: 30.0000 mL | Freq: Four times a day (QID) | ORAL | Status: DC | PRN

## 2018-05-02 MED ORDER — ONDANSETRON HCL 4 MG/2ML IJ SOLN
INTRAMUSCULAR | Status: AC
  Filled 2018-05-02: qty 2

## 2018-05-02 MED ORDER — HYDROXYZINE HCL 25 MG PO TABS
ORAL_TABLET | ORAL | Status: AC
  Filled 2018-05-02: qty 2

## 2018-05-02 MED ORDER — ASPIRIN EC 81 MG PO TBEC
162.0000 mg | DELAYED_RELEASE_TABLET | Freq: Every day | ORAL | Status: DC
  Administered 2018-05-02: 162 mg via ORAL
  Filled 2018-05-02: qty 2

## 2018-05-02 MED ORDER — VILAZODONE HCL 20 MG PO TABS
40.0000 mg | ORAL_TABLET | Freq: Every day | ORAL | Status: DC
  Administered 2018-05-03: 08:00:00 40 mg via ORAL
  Filled 2018-05-02 (×3): qty 2

## 2018-05-02 MED ORDER — LORAZEPAM 2 MG/ML IJ SOLN: 0.0000 mg | Freq: Two times a day (BID) | INTRAMUSCULAR | Status: DC

## 2018-05-02 MED ORDER — NICOTINE 21 MG/24HR TD PT24
21.0000 mg | MEDICATED_PATCH | Freq: Once | TRANSDERMAL | Status: DC
  Administered 2018-05-02: 21 mg via TRANSDERMAL
  Filled 2018-05-02: qty 1

## 2018-05-02 MED ORDER — SODIUM CHLORIDE 0.9 % IV BOLUS
1000.0000 mL | Freq: Once | INTRAVENOUS | Status: AC
  Administered 2018-05-02: 21:00:00 1000 mL via INTRAVENOUS

## 2018-05-02 MED ORDER — ONDANSETRON HCL 4 MG/2ML IJ SOLN
4.0000 mg | Freq: Once | INTRAMUSCULAR | Status: AC
  Administered 2018-05-02: 4 mg via INTRAVENOUS
  Filled 2018-05-02: qty 2

## 2018-05-02 NOTE — ED Notes (Signed)
Pt does endorse SI. Reports suicidal ideation without a plan. AC notified.
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
## 2018-05-02 NOTE — ED Notes (Signed)
Pt washed up, changed linen and scrubs. Pt resting comfortably and watching tv at this time.

## 2018-05-02 NOTE — Progress Notes (Signed)
Pt meets inpatient criteria per [name], NP. Referral information has been sent to the following hospitals for review:  [facility]  [facility]  [facility]  [facility]  [facility]  [facility]  [facility]   Disposition will continue to assist with inpatient placement needs.   Wells Guiles, LCSW, LCAS Disposition CSW [facility]/TTS [phone]

## 2018-05-02 NOTE — ED Triage Notes (Signed)
[facility] called out for depression and alcohol addiction. Patient states he has not had any alcohol today.

## 2018-05-02 NOTE — ED Notes (Signed)
Pt still participating in TTS. Pt began vomiting without sitting up or removing surgical mask. Gave pt emesis bag and washcloth to clean up with until TTS is finished.

## 2018-05-02 NOTE — ED Notes (Signed)
TTS in progress 

## 2018-05-02 NOTE — ED Provider Notes (Signed)
[facility] EMERGENCY DEPARTMENT Provider Note   CSN: [id number] Arrival date & time: 05/02/18  1719    History   Chief Complaint Chief Complaint  Patient presents with  . V70.1    HPI Malik Edwards is a 43 y.o. male with a history of alcoholism, stating has been drinking most days recently but not today, anxiety, cardiomyopathy, depression, hypertension and PTSD presenting with increasing depression and suicidal ideation without a plan.  He reports being depressed for a long period of time which has been escalating recently.  He is not for coming on any immediate triggers but does present here voluntarily.  He denies any actions of harmful activities or ingestions other than EtOH abuse.  He has not taken any of his medications today including his psych meds nor his Coreg which may be the source of his current tachycardia.  He denies any chest pain, shortness of breath or any other physical symptoms at this time.  He also denies history of EtOH withdrawal symptoms.     The history is provided by the patient.    Past Medical History:  Diagnosis Date  . Alcoholic (HCC)   . Anxiety   . Atrial flutter (HCC) 2013  . Cardiomyopathy (HCC)   . Colitis   . Depression   . High cholesterol   . Hypertension   . OCD (obsessive compulsive disorder)   . OSA (obstructive sleep apnea) 01/22/2013  . PTSD (post-traumatic stress disorder)     Patient Active Problem List   Diagnosis Date Noted  . Thrombocytopenia (HCC) 12/20/2017  . Hypomagnesemia 12/20/2017  . Cellulitis, gluteal, left 12/20/2017  . Acute alcoholic gastritis with hemorrhage   . Hematemesis 12/19/2017  . Infected finger laceration 12/01/2017  . AKI (acute kidney injury) (HCC)   . Alcohol abuse   . Acute systolic CHF (congestive heart failure) (HCC)   . Bacteremia 10/20/2017  . Alcohol withdrawal (HCC) 10/15/2017  . Alcohol dependence with acute alcoholic intoxication (HCC) 02/20/2017  . Ingestion of substance  02/20/2017  . Severe obesity (BMI 35.0-35.9 with comorbidity) (HCC) 09/01/2016  . Alcohol use disorder, moderate, dependence (HCC) 01/13/2015  . PTSD (post-traumatic stress disorder) 01/13/2015  . 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 OSA (obstructive sleep apnea) 01/22/2013  . Tobacco use 10/15/2012  . Paroxysmal atrial flutter (HCC) 09/13/2012  . Cardiomyopathy- etiol uindetermined- EF 30 to 35% 09/13/2012  . HTN (hypertension) 09/13/2012  . Transaminitis- ?fatty liver, worse when statin added 09/13/2012    Past Surgical History:  Procedure Laterality Date  . CARDIOVERSION N/A 09/15/2012   Procedure: CARDIOVERSION;  Surgeon: Chrystie Nose, MD;  Location: [facility];  Service: Cardiovascular;  Laterality: N/A;        Home Medications    Prior to Admission medications   Medication Sig Start Date End Date Taking? Authorizing Provider  aspirin EC 81 MG EC tablet Take 2 tablets (162 mg total) by mouth daily. For heart health Patient taking differently: Take 81 mg by mouth daily. For heart health 09/01/15  Yes Armandina Stammer I, NP  Brexpiprazole (REXULTI) 2 MG TABS Take 2 mg by mouth every morning.   Yes [provider]  carvedilol (COREG) 25 MG tablet Take 0.5 tablets (12.5 mg total) by mouth 2 (two) times daily. Patient taking differently: Take 25 mg by mouth 2 (two) times daily.  12/21/17  Yes Johnson, Clanford L, MD  Cholecalciferol (VITAMIN D) 50 MCG (2000 UT) tablet Take 2,000 Units by mouth  daily.   Yes [provider]  Multiple Vitamin (MULTIVITAMIN WITH MINERALS) TABS tablet Take 1 tablet by mouth daily.   Yes [provider]  Vilazodone HCl (VIIBRYD) 40 MG TABS Take 1 tablet (40 mg total) by mouth daily with breakfast. For OCD/depression 09/01/15  Yes Sanjuana Kava, NP    Family History Family History  Problem Relation Age of Onset  . Healthy Mother   . Healthy Father   . Cancer Father         prostate cancer  . Heart attack Maternal Grandmother   . Heart attack Maternal Grandfather   . Heart attack Paternal Grandmother   . Sudden Cardiac Death Neg Hx     Social History Social History   Tobacco Use  . Smoking status: Current Every Day Smoker    Packs/day: 0.25    Years: 20.00    Pack years: 5.00    Types: Cigarettes  . Smokeless tobacco: Never Used  Substance Use Topics  . Alcohol use: Yes    Alcohol/week: 12.0 standard drinks    Types: 12 Standard drinks or equivalent per week    Comment: 1/2 gallon liquor per day. drinks about a fifth about 3 times a week.  . Drug use: No    Comment: denied all drug use     Allergies   Gabapentin and Statins   Review of Systems Review of Systems  Constitutional: Negative for fever.  HENT: Negative for congestion and sore throat.   Eyes: Negative.   Respiratory: Negative for chest tightness and shortness of breath.   Cardiovascular: Negative for chest pain.  Gastrointestinal: Negative for abdominal pain, nausea and vomiting.  Genitourinary: Negative.   Musculoskeletal: Negative for arthralgias, joint swelling and neck pain.  Skin: Negative.  Negative for rash and wound.  Neurological: Negative for dizziness, weakness, light-headedness, numbness and headaches.  Psychiatric/Behavioral: Positive for dysphoric mood and suicidal ideas.     Physical Exam Updated Vital Signs BP 138/90 (BP Location: Left Arm)   Pulse (!) 115   Temp 98.5 F (36.9 C) (Oral)   Resp 20   Ht 5\' 8"  (1.727 m)   Wt 96.2 kg   SpO2 94%   BMI 32.23 kg/m   Physical Exam Vitals signs and nursing note reviewed.  Constitutional:      Appearance: He is well-developed.  HENT:     Head: Normocephalic and atraumatic.  Eyes:     Conjunctiva/sclera: Conjunctivae normal.  Neck:     Musculoskeletal: Normal range of motion.  Cardiovascular:     Rate and Rhythm: Regular rhythm. Tachycardia present.     Heart sounds: Normal heart sounds.  Pulmonary:      Effort: Pulmonary effort is normal.     Breath sounds: Normal breath sounds. No wheezing.  Abdominal:     General: Bowel sounds are normal.     Palpations: Abdomen is soft. There is no mass.     Tenderness: There is no abdominal tenderness. There is no guarding.  Musculoskeletal:        General: No swelling or signs of injury.  Skin:    General: Skin is warm and dry.     Comments: Several old healing abrasions on forearms and hands.  Neurological:     General: No focal deficit present.     Mental Status: He is alert and oriented to person, place, and time.  Psychiatric:        Attention and Perception: Attention normal. He does not perceive auditory  or visual hallucinations.        Mood and Affect: Affect is flat.        Speech: Speech normal.        Behavior: Behavior normal. Behavior is cooperative.        Thought Content: Thought content includes suicidal ideation. Thought content does not include suicidal plan.      ED Treatments / Results  Labs (all labs ordered are listed, but only abnormal results are displayed) Labs Reviewed  COMPREHENSIVE METABOLIC PANEL - Abnormal; Notable for the following components:      Result Value   Chloride 96 (*)    CO2 18 (*)    Calcium 8.3 (*)    AST 363 (*)    ALT 155 (*)    Total Bilirubin 2.0 (*)    Anion gap >20 (*)    All other components within normal limits  ETHANOL - Abnormal; Notable for the following components:   Alcohol, Ethyl (B) 311 (*)    All other components within normal limits  ACETAMINOPHEN LEVEL - Abnormal; Notable for the following components:   Acetaminophen (Tylenol), Serum <10 (*)    All other components within normal limits  CBC - Abnormal; Notable for the following components:   Platelets 118 (*)    All other components within normal limits  SALICYLATE LEVEL  RAPID URINE DRUG SCREEN, HOSP PERFORMED    EKG None  Radiology No results found.  Procedures Procedures (including critical care time)   Medications Ordered in ED Medications  aspirin EC tablet 162 mg (162 mg Oral Given 05/02/18 1832)  carvedilol (COREG) tablet 25 mg (has no administration in time range)  Vilazodone HCl TABS 40 mg (has no administration in time range)  nicotine (NICODERM CQ - dosed in mg/24 hours) patch 21 mg (21 mg Transdermal Patch Applied 05/02/18 1832)  LORazepam (ATIVAN) injection 0-4 mg (has no administration in time range)    Or  LORazepam (ATIVAN) tablet 0-4 mg (has no administration in time range)  thiamine (VITAMIN B-1) tablet 100 mg ( Oral See Alternative 05/02/18 2038)    Or  thiamine (B-1) injection 100 mg (100 mg Intravenous Given 05/02/18 2038)  ondansetron (ZOFRAN) tablet 4 mg (has no administration in time range)  alum & mag hydroxide-simeth (MAALOX/MYLANTA) 200-200-20 MG/5ML suspension 30 mL (has no administration in time range)  sodium chloride 0.9 % bolus 1,000 mL (1,000 mLs Intravenous New Bag/Given 05/02/18 2040)  ondansetron (ZOFRAN) injection 4 mg (4 mg Intravenous Given 05/02/18 2038)     Initial Impression / Assessment and Plan / ED Course  I have reviewed the triage vital signs and the nursing notes.  Pertinent labs & imaging results that were available during my care of the patient were reviewed by me and considered in my medical decision making (see chart for details).        Pt with extremely high etoh level despite stating no intake today.  He has elevation in his LFT's which is c/w prior LFT's and his etoh abuse.  He had emesis here while awaiting TTS consult.  zofran given per IV in addition to IV fluids.  Acidosis anion gap of 20,  Again suspect etoh as the source.  Pt is on CIWA protocol.   9:15 PM Call from [place] with [facility]. Pt accepted to them.  Expect bed available tomorrow 05/03/18.    Final Clinical Impressions(s) / ED Diagnoses   Final diagnoses:  ETOH abuse  Suicidal ideation    ED Discharge  Orders    None       Victoriano Lain 05/02/18 2116    Terrilee Files, MD 05/03/18 1055

## 2018-05-02 NOTE — ED Notes (Signed)
The patient has been wanded by security 

## 2018-05-02 NOTE — ED Notes (Signed)
Date and time results received: 05/02/18 6:37 PM   Test: Ethanol Critical Value: 311  Name of Provider Notified: Burgess Amor  Orders Received? Or Actions Taken? See orders

## 2018-05-03 DIAGNOSIS — F10239 Alcohol dependence with withdrawal, unspecified: Secondary | ICD-10-CM | POA: Diagnosis not present

## 2018-05-03 DIAGNOSIS — F332 Major depressive disorder, recurrent severe without psychotic features: Secondary | ICD-10-CM | POA: Diagnosis not present

## 2018-05-03 MED ORDER — LORAZEPAM 2 MG/ML IJ SOLN
0.0000 mg | Freq: Two times a day (BID) | INTRAMUSCULAR | Status: DC
  Administered 2018-05-03: 2 mg via INTRAVENOUS
  Filled 2018-05-03: qty 1

## 2018-05-03 MED ORDER — LORAZEPAM 1 MG PO TABS: 0.0000 mg | ORAL_TABLET | Freq: Two times a day (BID) | ORAL | Status: DC

## 2018-05-03 MED ORDER — ONDANSETRON HCL 4 MG/2ML IJ SOLN
INTRAMUSCULAR | Status: AC
  Filled 2018-05-03: qty 2

## 2018-05-03 MED ORDER — ONDANSETRON HCL 4 MG/2ML IJ SOLN
4.0000 mg | Freq: Once | INTRAMUSCULAR | Status: AC
  Administered 2018-05-03: 04:00:00 4 mg via INTRAVENOUS

## 2018-05-03 MED ORDER — LORAZEPAM 2 MG/ML IJ SOLN
0.0000 mg | Freq: Four times a day (QID) | INTRAMUSCULAR | Status: DC
  Administered 2018-05-03: 05:00:00 2 mg via INTRAVENOUS
  Filled 2018-05-03: qty 1

## 2018-05-03 MED ORDER — LORAZEPAM 1 MG PO TABS: 0.0000 mg | ORAL_TABLET | Freq: Four times a day (QID) | ORAL | Status: DC

## 2018-05-03 NOTE — Progress Notes (Signed)
Crystal with [facility] called to report the pt has been accepted to their facility. Call to report [phone]. Attending provider will be Dr. Shawnee Knapp, MD. Bed ready at 8am. ED staff Casimiro Needle, RN has been advised.   Crystal provided a call back number of [phone] for any questions.  Princess Bruins, MSW, LCSW Therapeutic Triage Specialist  [phone]

## 2018-05-03 NOTE — ED Notes (Signed)
ED TO INPATIENT HANDOFF REPORT  ED Nurse Name and Phone #:   S Name/Age/Gender Lianne Bushy 43 y.o. male Room/Bed: APA17/APA17  Code Status   Code Status: Full Code  Home/SNF/Other Home Patient oriented to: self Is this baseline? Yes   Triage Complete: Triage complete  Chief Complaint ;v70.1  Triage Note [facility] called out for depression and alcohol addiction. Patient states he has not had any alcohol today.    Allergies Allergies  Allergen Reactions  . Gabapentin Swelling  . Statins     Elevated LFTs  (Lipitor specifically)    Level of Care/Admitting Diagnosis ED Disposition    ED Disposition Condition Comment   Transfer to Another Facility  The patient appears reasonably stabilized for transfer considering the current resources, flow, and capabilities available in the ED at this time, and I doubt any other [facility] requiring further screening and/or treatment in the ED prior to transfer is p resent.       B Medical/Surgery History Past Medical History:  Diagnosis Date  . Alcoholic (HCC)   . Anxiety   . Atrial flutter (HCC) 2013  . Cardiomyopathy (HCC)   . Colitis   . Depression   . High cholesterol   . Hypertension   . OCD (obsessive compulsive disorder)   . OSA (obstructive sleep apnea) 01/22/2013  . PTSD (post-traumatic stress disorder)    Past Surgical History:  Procedure Laterality Date  . CARDIOVERSION N/A 09/15/2012   Procedure: CARDIOVERSION;  Surgeon: Chrystie Nose, MD;  Location: [facility] OR;  Service: Cardiovascular;  Laterality: N/A;     A IV Location/Drains/Wounds Patient Lines/Drains/Airways Status   Active Line/Drains/Airways    None          Intake/Output Last 24 hours  Intake/Output Summary (Last 24 hours) at 05/03/2018 0802 Last data filed at 05/02/2018 2230 Gross per 24 hour  Intake 1060 ml  Output -  Net 1060 ml    Labs/Imaging Results for orders placed or performed during the hospital encounter of 05/02/18 (from the  past 48 hour(s))  Comprehensive metabolic panel     Status: Abnormal   Collection Time: 05/02/18  5:50 PM  Result Value Ref Range   Sodium 137 135 - 145 mmol/L   Potassium 4.0 3.5 - 5.1 mmol/L   Chloride 96 (L) 98 - 111 mmol/L   CO2 18 (L) 22 - 32 mmol/L   Glucose, Bld 93 70 - 99 mg/dL   BUN 20 6 - 20 mg/dL   Creatinine, Ser 8.36 0.61 - 1.24 mg/dL   Calcium 8.3 (L) 8.9 - 10.3 mg/dL   Total Protein 8.0 6.5 - 8.1 g/dL   Albumin 4.4 3.5 - 5.0 g/dL   AST 629 (H) 15 - 41 U/L   ALT 155 (H) 0 - 44 U/L   Alkaline Phosphatase 76 38 - 126 U/L   Total Bilirubin 2.0 (H) 0.3 - 1.2 mg/dL   GFR calc non Af Amer >60 >60 mL/min   GFR calc Af Amer >60 >60 mL/min   Anion gap >20 (H) 5 - 15    Comment: Performed at [facility], [address] [place], [place] [address]  Ethanol     Status: Abnormal   Collection Time: 05/02/18  5:50 PM  Result Value Ref Range   Alcohol, Ethyl (B) 311 (HH) <10 mg/dL    Comment: CRITICAL RESULT CALLED TO, READ BACK BY AND VERIFIED WITH: OAKLEY,B ON 05/02/18 AT 1835 BY LOY,C (NOTE) Lowest detectable limit for serum alcohol is  10 mg/dL. For medical purposes only. Performed at [facility], [address], [place], [place] [address]   Salicylate level     Status: None   Collection Time: 05/02/18  5:50 PM  Result Value Ref Range   Salicylate Lvl <7.0 2.8 - 30.0 mg/dL    Comment: Performed at [facility], [address], [place], [place] [address]  Acetaminophen level     Status: Abnormal   Collection Time: 05/02/18  5:50 PM  Result Value Ref Range   Acetaminophen (Tylenol), Serum <10 (L) 10 - 30 ug/mL    Comment: (NOTE) Therapeutic concentrations vary significantly. A range of 10-30 ug/mL  may be an effective concentration for many patients. However, some  are best treated at concentrations outside of this range. Acetaminophen concentrations >150 ug/mL at 4 hours after ingestion  and >50 ug/mL at 12 hours after ingestion are often associated with  toxic  reactions. Performed at [facility], [address], [place], [place] [address]   cbc     Status: Abnormal   Collection Time: 05/02/18  5:50 PM  Result Value Ref Range   WBC 8.4 4.0 - 10.5 K/uL   RBC 4.80 4.22 - 5.81 MIL/uL   Hemoglobin 15.2 13.0 - 17.0 g/dL   HCT 07.2 25.7 - 50.5 %   MCV 94.0 80.0 - 100.0 fL   MCH 31.7 26.0 - 34.0 pg   MCHC 33.7 30.0 - 36.0 g/dL   RDW 18.3 35.8 - 25.1 %   Platelets 118 (L) 150 - 400 K/uL    Comment: SPECIMEN CHECKED FOR CLOTS   nRBC 0.0 0.0 - 0.2 %    Comment: Performed at [facility], [address], [place], [place] [address]  Rapid urine drug screen (hospital performed)     Status: None   Collection Time: 05/02/18  5:50 PM  Result Value Ref Range   Opiates NONE DETECTED NONE DETECTED   Cocaine NONE DETECTED NONE DETECTED   Benzodiazepines NONE DETECTED NONE DETECTED   Amphetamines NONE DETECTED NONE DETECTED   Tetrahydrocannabinol NONE DETECTED NONE DETECTED   Barbiturates NONE DETECTED NONE DETECTED    Comment: (NOTE) DRUG SCREEN FOR MEDICAL PURPOSES ONLY.  IF CONFIRMATION IS NEEDED FOR ANY PURPOSE, NOTIFY LAB WITHIN 5 DAYS. LOWEST DETECTABLE LIMITS FOR URINE DRUG SCREEN Drug Class                     Cutoff (ng/mL) Amphetamine and metabolites    1000 Barbiturate and metabolites    200 Benzodiazepine                 200 Tricyclics and metabolites     300 Opiates and metabolites        300 Cocaine and metabolites        300 THC                            50 Performed at [facility], [address], [place], [place] [address]    No results found.  Pending Labs Unresulted Labs (From admission, onward)   None      Vitals/Pain Today's Vitals   05/03/18 0624 05/03/18 0734 05/03/18 0736 05/03/18 0742  BP: 138/89  140/85   Pulse: (!) 120  (!) 107   Resp: 19  19   Temp: 98.4 F (36.9 C)  99.3 F (37.4 C)   TempSrc:   Oral   SpO2: 96%  95%  Weight:      Height:      PainSc:  0-No pain  0-No pain    Isolation  Precautions No active isolations  Medications Medications  aspirin EC tablet 162 mg (162 mg Oral Given 05/02/18 1832)  carvedilol (COREG) tablet 25 mg (25 mg Oral Given 05/02/18 2155)  Vilazodone HCl TABS 40 mg (40 mg Oral Given 05/03/18 0745)  nicotine (NICODERM CQ - dosed in mg/24 hours) patch 21 mg (21 mg Transdermal Patch Applied 05/02/18 1832)  thiamine (VITAMIN B-1) tablet 100 mg ( Oral See Alternative 05/02/18 2038)    Or  thiamine (B-1) injection 100 mg (100 mg Intravenous Given 05/02/18 2038)  ondansetron (ZOFRAN) tablet 4 mg (has no administration in time range)  alum & mag hydroxide-simeth (MAALOX/MYLANTA) 200-200-20 MG/5ML suspension 30 mL (has no administration in time range)  LORazepam (ATIVAN) injection 0-4 mg (2 mg Intravenous Given 05/03/18 0524)    Or  LORazepam (ATIVAN) tablet 0-4 mg ( Oral See Alternative 05/03/18 0524)  sodium chloride 0.9 % bolus 1,000 mL (0 mLs Intravenous Stopped 05/02/18 2230)  ondansetron (ZOFRAN) injection 4 mg (4 mg Intravenous Given 05/02/18 2038)  hydrOXYzine (ATARAX/VISTARIL) tablet 50 mg (50 mg Oral Not Given 05/02/18 2214)  promethazine (PHENERGAN) injection 25 mg (25 mg Intravenous Given 05/02/18 2208)  ondansetron (ZOFRAN) injection 4 mg (4 mg Intravenous Given 05/02/18 2359)  ondansetron (ZOFRAN) injection 4 mg (4 mg Intravenous Given 05/03/18 1517)    Mobility walks Low fall risk   Focused Assessments    R Recommendations: See Admitting Provider Note  Report given to:   Additional Notes:

## 2018-05-04 DIAGNOSIS — F10239 Alcohol dependence with withdrawal, unspecified: Secondary | ICD-10-CM | POA: Diagnosis not present

## 2018-05-04 DIAGNOSIS — I4892 Unspecified atrial flutter: Secondary | ICD-10-CM | POA: Diagnosis not present

## 2018-05-04 DIAGNOSIS — I429 Cardiomyopathy, unspecified: Secondary | ICD-10-CM | POA: Diagnosis not present

## 2018-05-04 DIAGNOSIS — F332 Major depressive disorder, recurrent severe without psychotic features: Secondary | ICD-10-CM | POA: Diagnosis not present

## 2018-05-05 DIAGNOSIS — F332 Major depressive disorder, recurrent severe without psychotic features: Secondary | ICD-10-CM | POA: Diagnosis not present

## 2018-05-05 DIAGNOSIS — F4310 Post-traumatic stress disorder, unspecified: Secondary | ICD-10-CM | POA: Diagnosis not present

## 2018-05-05 DIAGNOSIS — F10239 Alcohol dependence with withdrawal, unspecified: Secondary | ICD-10-CM | POA: Diagnosis not present

## 2018-05-06 DIAGNOSIS — I429 Cardiomyopathy, unspecified: Secondary | ICD-10-CM | POA: Diagnosis not present

## 2018-05-06 DIAGNOSIS — I4892 Unspecified atrial flutter: Secondary | ICD-10-CM | POA: Diagnosis not present

## 2018-05-06 DIAGNOSIS — F10239 Alcohol dependence with withdrawal, unspecified: Secondary | ICD-10-CM | POA: Diagnosis not present

## 2018-05-06 DIAGNOSIS — F332 Major depressive disorder, recurrent severe without psychotic features: Secondary | ICD-10-CM | POA: Diagnosis not present

## 2018-05-08 DIAGNOSIS — F332 Major depressive disorder, recurrent severe without psychotic features: Secondary | ICD-10-CM | POA: Diagnosis not present

## 2018-05-08 DIAGNOSIS — F10239 Alcohol dependence with withdrawal, unspecified: Secondary | ICD-10-CM | POA: Diagnosis not present

## 2018-05-08 DIAGNOSIS — I429 Cardiomyopathy, unspecified: Secondary | ICD-10-CM | POA: Diagnosis not present

## 2018-05-08 DIAGNOSIS — I4892 Unspecified atrial flutter: Secondary | ICD-10-CM | POA: Diagnosis not present

## 2018-05-10 DIAGNOSIS — F332 Major depressive disorder, recurrent severe without psychotic features: Secondary | ICD-10-CM | POA: Diagnosis not present

## 2018-05-10 DIAGNOSIS — F4310 Post-traumatic stress disorder, unspecified: Secondary | ICD-10-CM | POA: Diagnosis not present

## 2018-05-11 DIAGNOSIS — F332 Major depressive disorder, recurrent severe without psychotic features: Secondary | ICD-10-CM | POA: Diagnosis not present

## 2018-05-11 DIAGNOSIS — F4310 Post-traumatic stress disorder, unspecified: Secondary | ICD-10-CM | POA: Diagnosis not present

## 2018-05-11 DIAGNOSIS — F10239 Alcohol dependence with withdrawal, unspecified: Secondary | ICD-10-CM | POA: Diagnosis not present

## 2018-05-12 DIAGNOSIS — F332 Major depressive disorder, recurrent severe without psychotic features: Secondary | ICD-10-CM | POA: Diagnosis not present

## 2018-05-13 DIAGNOSIS — F209 Schizophrenia, unspecified: Secondary | ICD-10-CM | POA: Diagnosis not present

## 2018-05-14 DIAGNOSIS — K7290 Hepatic failure, unspecified without coma: Secondary | ICD-10-CM | POA: Diagnosis not present

## 2018-05-14 DIAGNOSIS — F332 Major depressive disorder, recurrent severe without psychotic features: Secondary | ICD-10-CM | POA: Diagnosis not present

## 2018-05-14 DIAGNOSIS — F4310 Post-traumatic stress disorder, unspecified: Secondary | ICD-10-CM | POA: Diagnosis not present

## 2018-05-20 DIAGNOSIS — F4310 Post-traumatic stress disorder, unspecified: Secondary | ICD-10-CM | POA: Diagnosis not present

## 2018-05-20 DIAGNOSIS — F4481 Dissociative identity disorder: Secondary | ICD-10-CM | POA: Diagnosis not present

## 2018-05-26 DIAGNOSIS — F4481 Dissociative identity disorder: Secondary | ICD-10-CM | POA: Diagnosis not present

## 2018-05-26 DIAGNOSIS — R410 Disorientation, unspecified: Secondary | ICD-10-CM | POA: Diagnosis not present

## 2018-05-26 DIAGNOSIS — F4310 Post-traumatic stress disorder, unspecified: Secondary | ICD-10-CM | POA: Diagnosis not present

## 2018-05-27 ENCOUNTER — Encounter (HOSPITAL_COMMUNITY): Payer: Self-pay

## 2018-05-27 ENCOUNTER — Emergency Department (HOSPITAL_COMMUNITY)
Admission: EM | Admit: 2018-05-27 | Discharge: 2018-05-27 | Disposition: A | Discharge status: Home / Self Care | Payer: BLUE CROSS/BLUE SHIELD | Attending: Emergency Medicine | Admitting: Emergency Medicine

## 2018-05-27 ENCOUNTER — Other Ambulatory Visit: Payer: Self-pay

## 2018-05-27 DIAGNOSIS — I10 Essential (primary) hypertension: Secondary | ICD-10-CM | POA: Diagnosis not present

## 2018-05-27 DIAGNOSIS — F32A Depression, unspecified: Secondary | ICD-10-CM

## 2018-05-27 DIAGNOSIS — F1020 Alcohol dependence, uncomplicated: Secondary | ICD-10-CM

## 2018-05-27 DIAGNOSIS — F10929 Alcohol use, unspecified with intoxication, unspecified: Secondary | ICD-10-CM | POA: Diagnosis not present

## 2018-05-27 DIAGNOSIS — F17210 Nicotine dependence, cigarettes, uncomplicated: Secondary | ICD-10-CM | POA: Diagnosis not present

## 2018-05-27 DIAGNOSIS — R410 Disorientation, unspecified: Secondary | ICD-10-CM | POA: Diagnosis not present

## 2018-05-27 DIAGNOSIS — F329 Major depressive disorder, single episode, unspecified: Secondary | ICD-10-CM | POA: Insufficient documentation

## 2018-05-27 DIAGNOSIS — Z79899 Other long term (current) drug therapy: Secondary | ICD-10-CM | POA: Diagnosis not present

## 2018-05-27 LAB — BASIC METABOLIC PANEL
Anion gap: 12 (ref 5–15)
BUN: 14 mg/dL (ref 6–20)
CO2: 24 mmol/L (ref 22–32)
Calcium: 8.6 mg/dL — ABNORMAL LOW (ref 8.9–10.3)
Chloride: 106 mmol/L (ref 98–111)
Creatinine, Ser: 0.67 mg/dL (ref 0.61–1.24)
GFR calc Af Amer: >60 mL/min (ref >60)
GFR calc non Af Amer: >60 mL/min (ref >60)
Glucose, Bld: 97 mg/dL (ref 70–99)
Potassium: 3.9 mmol/L (ref 3.5–5.1)
Sodium: 142 mmol/L (ref 135–145)

## 2018-05-27 LAB — RAPID URINE DRUG SCREEN, HOSP PERFORMED
Amphetamines: NOT DETECTED
Barbiturates: NOT DETECTED
Benzodiazepines: NOT DETECTED
Cocaine: NOT DETECTED
Opiates: NOT DETECTED
Tetrahydrocannabinol: NOT DETECTED

## 2018-05-27 LAB — URINALYSIS, ROUTINE W REFLEX MICROSCOPIC
Bilirubin Urine: NEGATIVE
Glucose, UA: NEGATIVE mg/dL
Hgb urine dipstick: NEGATIVE
Ketones, ur: NEGATIVE mg/dL
Leukocytes,Ua: NEGATIVE
Nitrite: NEGATIVE
Protein, ur: NEGATIVE mg/dL
Specific Gravity, Urine: 1.013 (ref 1.005–1.030)
pH: 6 (ref 5.0–8.0)

## 2018-05-27 LAB — HEPATIC FUNCTION PANEL
ALT: 39 U/L (ref 0–44)
AST: 41 U/L (ref 15–41)
Albumin: 3.7 g/dL (ref 3.5–5.0)
Alkaline Phosphatase: 61 U/L (ref 38–126)
Bilirubin, Direct: <0.1 mg/dL (ref 0.0–0.2)
Total Bilirubin: 0.4 mg/dL (ref 0.3–1.2)
Total Protein: 6.9 g/dL (ref 6.5–8.1)

## 2018-05-27 LAB — ETHANOL: Alcohol, Ethyl (B): 281 mg/dL — ABNORMAL HIGH (ref <10)

## 2018-05-27 LAB — AMMONIA: Ammonia: 18 umol/L (ref 9–35)

## 2018-05-27 MED ORDER — HALOPERIDOL LACTATE 5 MG/ML IJ SOLN
10.0000 mg | Freq: Once | INTRAMUSCULAR | Status: AC
  Administered 2018-05-27: 11:00:00 10 mg via INTRAMUSCULAR
  Filled 2018-05-27: qty 2

## 2018-05-27 NOTE — ED Notes (Signed)
Pt wanded by security. 

## 2018-05-27 NOTE — ED Notes (Signed)
Consult to TTS in progress. Pt is getting more agitated. Security has been called.

## 2018-05-27 NOTE — ED Notes (Signed)
IVC papers completed and faxed to Magistrate.

## 2018-05-27 NOTE — [facility] Counselor (Signed)
TTS assessment incomplete after pt walked out of his room prior to completion. Pt reported he only came to hospital for evaluation of elevated ammonia levels. Pt agitated at start of session, stating he does not need to be assessed & just wants to go home. This Clinical research associate assured pt that good quality care for him was the only intention. He appeared to calm & engaged in answered questions. Pt denies SI & HI. He reports 3 prior suicide attempts. Pt gave verbal permission to gain collateral information from his sister, Tresa Endo [phone]) & his counselor, [name]. Pt states he is active in outpt tx and was recently released from 12 days inpt. Pt last seen for [facility] assessment 2 weeks ago. Pt then repeatedly began announcing he was being kept against his will. Pt was advised he wasn't being kept against his will & we only had a few more questions. When pt was advised that he wasn't under IVC, he started to pull off monitors. Pt was told the ED MD would petition for IVC if pt left with concerns for his safety & pt continued to detached equipment. HIPPA compliant voice message left for sister at number above.

## 2018-05-27 NOTE — ED Notes (Signed)
Have switched the finger that SPO2 was on and now O2 sats 100%.

## 2018-05-27 NOTE — ED Notes (Signed)
Sitter at bedside.

## 2018-05-27 NOTE — ED Notes (Signed)
When sleeping pts O2 level drops to 83%. Will apply 2L [place] with sitter at bedside

## 2018-05-27 NOTE — ED Notes (Signed)
IVC papers faxed to [facility],  After receiving F & C orders from Magistrate.

## 2018-05-27 NOTE — BH Assessment (Signed)
Tele Assessment Note   Patient Name: Malik Edwards MRN: [id number] Referring Physician: Donnetta Hutchingook, Brian, MD Location of Patient: [facility] Location of Provider: Behavioral Health TTS Department  Malik Bushyhomas Wray Braver is a 43 y.o. male who presents voluntarily to [facility]. At this time pt is denying need for mental health evaluation. Pt states he came to ED for evaluation of elevated ammonia levels. Pt was agitated at start of session, stating he does not need to be assessed & just wants to go home. Pt appeared to be intoxicated and has previously reported daily alcohol use. This Clinical research associatewriter assured pt that good quality care for him was the only intention. He appeared to calm & engaged in answering questions. Pt denies SI & HI. He reports 3 prior suicide attempts. Pt gave verbal permission to gain collateral information from his sister, Tresa EndoKelly [phone]) & his counselor, Elly ModenaLaura Wolfe. Pt states he is active in outpt tx and was recently released from 12 days inpt. [facility] Pt then repeatedly began announcing he was being kept against his will. Pt was advised he wasn't being kept against his will & we only had a few more questions. When pt was advised that he wasn't under IVC, he started to pull off monitors. Pt was told the ED MD would petition for IVC if pt left with concerns for his safety & pt continued to detached equipment. HIPPA compliant voice message left for sister at number above. ? MSE: Pt is disheveled, alert, oriented x4 with argumentative but logical speech and restless motor behavior. Eye contact is fair. Pt's mood is agitated and affect is angry. Affect is congruent with mood. Thought process is coherent and relevant. There is no indication pt is currently responding to internal stimuli or experiencing delusional thought content.  Diagnosis: F10.22 Alcohol dependence with intoxication Disposition: Shuvon Rankin, NP recommends psych clearance   Past Medical History:   Past Medical History:  Diagnosis Date  . Alcoholic (HCC)   . Anxiety   . Atrial flutter (HCC) 2013  . Cardiomyopathy (HCC)   . Colitis   . Depression   . High cholesterol   . Hypertension   . OCD (obsessive compulsive disorder)   . OSA (obstructive sleep apnea) 01/22/2013  . PTSD (post-traumatic stress disorder)     Past Surgical History:  Procedure Laterality Date  . CARDIOVERSION N/A 09/15/2012   Procedure: CARDIOVERSION;  Surgeon: Chrystie NoseKenneth C. Hilty, MD;  [facility] OR;  Service: Cardiovascular;  Laterality: N/A;    Family History:  Family History  Problem Relation Age of Onset  . Healthy Mother   . Healthy Father   . Cancer Father        prostate cancer  . Heart attack Maternal Grandmother   . Heart attack Maternal Grandfather   . Heart attack Paternal Grandmother   . Sudden Cardiac Death Neg Hx     Social History:  reports that he has been smoking cigarettes. He has a 5.00 pack-year smoking history. He has never used smokeless tobacco. He reports current alcohol use of about 12.0 standard drinks of alcohol per week. He reports that he does not use drugs.  Additional Social History:  Alcohol / Drug Use Pain Medications: See MAR Prescriptions: See MAR Over the Counter: See MAR History of alcohol / drug use?: Yes Longest period of sobriety (when/how long): 2 years Negative Consequences of Use: Financial, Legal, Personal relationships, Work / School Substance #1 Name of Substance 1: Alcohol 1 -  Age of First Use: Adolescent 1 - Amount (size/oz): Approximately one fifth of vodka  CIWA: CIWA-Ar BP: (!) 159/100 Pulse Rate: 92 Nausea and Vomiting: no nausea and no vomiting Tactile Disturbances: none Tremor: no tremor Auditory Disturbances: not present Paroxysmal Sweats: no sweat visible Visual Disturbances: not present Anxiety: no anxiety, at ease Headache, Fullness in Head: none present Agitation: normal activity Orientation and Clouding of Sensorium:  oriented and can do serial additions CIWA-Ar Total: 0 COWS:    Allergies:  Allergies  Allergen Reactions  . Gabapentin Swelling  . Statins     Elevated LFTs  (Lipitor specifically)    Home Medications: (Not in a hospital admission)   OB/GYN Status:  No LMP for male patient.  General Assessment Data Location of Assessment: [facility] TTS Assessment: In system Is this a Tele or Face-to-Face Assessment?: Tele Assessment Is this an Initial Assessment or a Re-assessment for this encounter?: Initial Assessment Patient Accompanied by:: N/A Language Other than English: No Living Arrangements: Other (Comment) What gender do you identify as?: Male Marital status: Divorced Living Arrangements: Alone Can pt return to current living arrangement?: Yes Admission Status: Voluntary Is patient capable of signing voluntary admission?: Yes Referral Source: Self/Family/Friend(sister called sheriff/ambulance c concern re: ammonia level) Insurance type: BCBS     Crisis Care Plan Living Arrangements: Alone Name of Psychiatrist: [facility] Name of Therapist: Carlye Grippe and [name]  Education Status Is patient currently in school?: No Is the patient employed, unemployed or receiving disability?: Unemployed  Risk to self with the past 6 months Suicidal Ideation: No-Not Currently/Within Last 6 Months Has patient been a risk to self within the past 6 months prior to admission? : Yes Suicidal Intent: No-Not Currently/Within Last 6 Months Has patient had any suicidal intent within the past 6 months prior to admission? : Yes Is patient at risk for suicide?: Yes Suicidal Plan?: No(denies) Has patient had any suicidal plan within the past 6 months prior to admission? : Yes Specify Current Suicidal Plan: denies current plan Access to Means: Yes What has been your use of drugs/alcohol within the last 12 months?: daily etoh Previous Attempts/Gestures: Yes How many times?: 3 Other Self Harm  Risks: lives alone, depression dx, previous self-harm Triggers for Past Attempts: Family contact Intentional Self Injurious Behavior: Cutting, Burning Comment - Self Injurious Behavior: per record Family Suicide History: No Recent stressful life event(s): Job Loss, Legal Issues Depression: Yes Depression Symptoms: Feeling angry/irritable Substance abuse history and/or treatment for substance abuse?: Yes Suicide prevention information given to non-admitted patients: Not applicable  Risk to Others within the past 6 months Homicidal Ideation: No Does patient have any lifetime risk of violence toward others beyond the six months prior to admission? : No Thoughts of Harm to Others: No Current Homicidal Intent: No Current Homicidal Plan: No Access to Homicidal Means: No History of harm to others?: No Assessment of Violence: None Noted Violent Behavior Description: denies Does patient have access to weapons?: Yes (Comment)(previously reported access to firearms) Criminal Charges Pending?: Yes Describe Pending Criminal Charges: drunk & disorderly; failure to appear Does patient have a court date: No Is patient on probation?: No  Psychosis Hallucinations: (UTA- pt left room) Delusions: None noted  Mental Status Report Appearance/Hygiene: Disheveled Eye Contact: Fair Motor Activity: Freedom of movement Speech: Argumentative, Logical/coherent Level of Consciousness: Irritable, Alert Mood: Angry Affect: Angry, Irritable Anxiety Level: Minimal Thought Processes: Coherent, Relevant Judgement: Partial Orientation: Person, Place, Time, Situation Obsessive Compulsive Thoughts/Behaviors: None  Cognitive Functioning  Concentration: Fair Memory: Remote Intact, Recent Intact Is patient IDD: No Insight: Fair Impulse Control: Poor Appetite: (UTA) Have you had any weight changes? : Loss Sleep: Unable to Assess Vegetative Symptoms: Decreased grooming  ADLScreening [facility] Assessment  Services) Patient's cognitive ability adequate to safely complete daily activities?: Yes Patient able to express need for assistance with ADLs?: Yes Independently performs ADLs?: Yes (appropriate for developmental age)  Prior Inpatient Therapy Prior Inpatient Therapy: Yes Prior Therapy Dates: 07/2015 Prior Therapy Facilty/Provider(s): [facility] Reason for Treatment: PSTD, alcohol abuse  Prior Outpatient Therapy Prior Outpatient Therapy: Yes Prior Therapy Dates: Current Prior Therapy Facilty/Provider(s): [facility] Reason for Treatment: MDD, PTSD, DID, alcohol use Does patient have an ACCT team?: No Does patient have Intensive In-House Services?  : No Does patient have [facility] services? : No Does patient have P4CC services?: No  ADL Screening (condition at time of admission) Patient's cognitive ability adequate to safely complete daily activities?: Yes Is the patient deaf or have difficulty hearing?: No Does the patient have difficulty seeing, even when wearing glasses/contacts?: No Does the patient have difficulty concentrating, remembering, or making decisions?: No Patient able to express need for assistance with ADLs?: Yes Does the patient have difficulty dressing or bathing?: No Independently performs ADLs?: Yes (appropriate for developmental age) Does the patient have difficulty walking or climbing stairs?: No Weakness of Legs: None Weakness of Arms/Hands: None  Home Assistive Devices/Equipment Home Assistive Devices/Equipment: None  Therapy Consults (therapy consults require a physician order) PT Evaluation Needed: No OT Evalulation Needed: No SLP Evaluation Needed: No Abuse/Neglect Assessment (Assessment to be complete while patient is alone) Abuse/Neglect Assessment Can Be Completed: Unable to assess, patient is non-responsive or altered mental status     Advance Directives (For Healthcare) Does Patient Have a Medical Advance  Directive?: No Would patient like information on creating a medical advance directive?: No - Guardian declined          Disposition: Shuvon Rankin, NP recommends psych clearance Disposition Initial Assessment Completed for this Encounter: Yes Disposition of Patient: Discharge  This service was provided via telemedicine using a 2-way, interactive audio and video technology.  Deatra Mcmahen Suzan Nailer 05/27/2018 3:57 PM

## 2018-05-27 NOTE — ED Notes (Signed)
EDP filling out IVC papers now after speaking with his personal counselor from [place], [place] and she told the EDP pt voiced SI this am with her on the phone and she called 911.

## 2018-05-27 NOTE — ED Triage Notes (Signed)
Pt has been feeling confused for the past few days. Therapist called to get transport for being suicidal. History of depression. Alert and oriented. NAD.

## 2018-05-27 NOTE — ED Provider Notes (Signed)
Patient cleared by behavioral health for discharge home.  Patient now awake and functional did have lunch.  Patient in no acute distress.  Patient denies any suicidal thoughts.  Or any suicidal intentions.   Vanetta Mulders, MD 05/27/18 1622

## 2018-05-27 NOTE — ED Notes (Signed)
Dr Cook at bedside

## 2018-05-27 NOTE — ED Notes (Signed)
Pt trying to leave the ED. Security and [employer] called. Pt ripped out IV. Is being very erratic and loud with staff. Per Dr. Adriana Simas, he spoke with counselor who believes it is best to IVC patient due to Suicidal intentions told to counselor.

## 2018-05-27 NOTE — Discharge Instructions (Addendum)
Cleared by behavioral health for discharge home.  Outpatient resources provided as per behavioral health.

## 2018-05-27 NOTE — ED Provider Notes (Addendum)
[facility] EMERGENCY DEPARTMENT Provider Note   CSN: [id number] Arrival date & time: 05/27/18  2449    History   Chief Complaint Chief Complaint  Patient presents with  . Alcohol Intoxication    HPI Malik Edwards is a 43 y.o. male.     Level 5 caveat for psychiatric disorder.  Patient states he is confused.  He wonders if his ammonia level is elevated.  Past medical history includes alcoholism, depression, OCD, PTSD, cardiomyopathy, anxiety, many others.  He was allegedly was admitted to [facility] psychiatric facility earlier this month.  He claims no alcohol in the past month.  Questionable suicidal ideation.  No homicidal ideation.  No prodromal illnesses.  Spoke with therapist Carlye Grippe [phone] [facility]).  She states that his behavior is generally cordial and interactive.  He has also had suicidal ideation lately.     Past Medical History:  Diagnosis Date  . Alcoholic (HCC)   . Anxiety   . Atrial flutter (HCC) 2013  . Cardiomyopathy (HCC)   . Colitis   . Depression   . High cholesterol   . Hypertension   . OCD (obsessive compulsive disorder)   . OSA (obstructive sleep apnea) 01/22/2013  . PTSD (post-traumatic stress disorder)     Patient Active Problem List   Diagnosis Date Noted  . Thrombocytopenia (HCC) 12/20/2017  . Hypomagnesemia 12/20/2017  . Cellulitis, gluteal, left 12/20/2017  . Acute alcoholic gastritis with hemorrhage   . Hematemesis 12/19/2017  . Infected finger laceration 12/01/2017  . AKI (acute kidney injury) (HCC)   . Alcohol abuse   . Acute systolic CHF (congestive heart failure) (HCC)   . Bacteremia 10/20/2017  . Alcohol withdrawal (HCC) 10/15/2017  . Alcohol dependence with acute alcoholic intoxication (HCC) 02/20/2017  . Ingestion of substance 02/20/2017  . Severe obesity (BMI 35.0-35.9 with comorbidity) (HCC) 09/01/2016  . Alcohol use disorder, moderate, dependence (HCC) 01/13/2015  . PTSD (post-traumatic stress  disorder) 01/13/2015  . 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 OSA (obstructive sleep apnea) 01/22/2013  . Tobacco use 10/15/2012  . Paroxysmal atrial flutter (HCC) 09/13/2012  . Cardiomyopathy- etiol uindetermined- EF 30 to 35% 09/13/2012  . HTN (hypertension) 09/13/2012  . Transaminitis- ?fatty liver, worse when statin added 09/13/2012    Past Surgical History:  Procedure Laterality Date  . CARDIOVERSION N/A 09/15/2012   Procedure: CARDIOVERSION;  Surgeon: Chrystie Nose, MD;  Location: [facility] OR;  Service: Cardiovascular;  Laterality: N/A;        Home Medications    Prior to Admission medications   Medication Sig Start Date End Date Taking? Authorizing Provider  aspirin EC 81 MG EC tablet Take 2 tablets (162 mg total) by mouth daily. For heart health Patient taking differently: Take 81 mg by mouth daily. For heart health 09/01/15   Armandina Stammer I, NP  Brexpiprazole (REXULTI) 2 MG TABS Take 2 mg by mouth every morning.    [provider]  carvedilol (COREG) 25 MG tablet Take 0.5 tablets (12.5 mg total) by mouth 2 (two) times daily. Patient taking differently: Take 25 mg by mouth 2 (two) times daily.  12/21/17   Johnson, Clanford L, MD  Cholecalciferol (VITAMIN D) 50 MCG (2000 UT) tablet Take 2,000 Units by mouth daily.    [provider]  Multiple Vitamin (MULTIVITAMIN WITH MINERALS) TABS tablet Take 1 tablet by mouth daily.    [provider]  Vilazodone HCl (VIIBRYD) 40 MG TABS Take 1 tablet (  40 mg total) by mouth daily with breakfast. For OCD/depression 09/01/15   Sanjuana Kava, NP    Family History Family History  Problem Relation Age of Onset  . Healthy Mother   . Healthy Father   . Cancer Father        prostate cancer  . Heart attack Maternal Grandmother   . Heart attack Maternal Grandfather   . Heart attack Paternal Grandmother   . Sudden Cardiac Death Neg Hx     Social History Social  History   Tobacco Use  . Smoking status: Current Every Day Smoker    Packs/day: 0.25    Years: 20.00    Pack years: 5.00    Types: Cigarettes  . Smokeless tobacco: Never Used  Substance Use Topics  . Alcohol use: Yes    Alcohol/week: 12.0 standard drinks    Types: 12 Standard drinks or equivalent per week    Comment: 1/2 gallon liquor per day. drinks about a fifth about 3 times a week.  . Drug use: No    Comment: denied all drug use     Allergies   Gabapentin and Statins   Review of Systems Review of Systems  Unable to perform ROS: Psychiatric disorder     Physical Exam Updated Vital Signs BP (!) 159/100   Pulse 92   Temp 97.7 F (36.5 C) (Oral)   Resp 20   SpO2 100%   Physical Exam Vitals signs and nursing note reviewed.  Constitutional:      Appearance: He is well-developed.  HENT:     Head: Normocephalic and atraumatic.  Eyes:     Conjunctiva/sclera: Conjunctivae normal.  Neck:     Musculoskeletal: Neck supple.  Cardiovascular:     Rate and Rhythm: Normal rate and regular rhythm.  Pulmonary:     Effort: Pulmonary effort is normal.     Breath sounds: Normal breath sounds.  Abdominal:     General: Bowel sounds are normal.     Palpations: Abdomen is soft.  Musculoskeletal: Normal range of motion.  Skin:    General: Skin is warm and dry.  Neurological:     Mental Status: He is alert and oriented to person, place, and time.  Psychiatric:     Comments: Flat affect, paranoid behavior      ED Treatments / Results  Labs (all labs ordered are listed, but only abnormal results are displayed) Labs Reviewed  ETHANOL - Abnormal; Notable for the following components:      Result Value   Alcohol, Ethyl (B) 281 (*)    All other components within normal limits  BASIC METABOLIC PANEL - Abnormal; Notable for the following components:   Calcium 8.6 (*)    All other components within normal limits  URINALYSIS, ROUTINE W REFLEX MICROSCOPIC  RAPID URINE DRUG  SCREEN, HOSP PERFORMED  CBC WITH DIFFERENTIAL/PLATELET  AMMONIA  HEPATIC FUNCTION PANEL    EKG None  Radiology No results found.  Procedures Procedures (including critical care time)  Medications Ordered in ED Medications  haloperidol lactate (HALDOL) injection 10 mg (10 mg Intramuscular Given 05/27/18 1109)     Initial Impression / Assessment and Plan / ED Course  I have reviewed the triage vital signs and the nursing notes.  Pertinent labs & imaging results that were available during my care of the patient were reviewed by me and considered in my medical decision making (see chart for details).        Patient presents with a sense  of confusion and a concern about his ammonia level.  He states no alcohol in the past month.  He does have a significant psychiatric history.  Will obtain screening labs including ammonia level.  Consult TTS.  11:00 will fill out judicial IVC papers.  Rx Haldol 10 mg IM secondary to aggressive violent behavior.  Ammonia level within normal limits.  CRITICAL CARE Performed by: Donnetta HutchingBrian Arlone Lenhardt Total critical care time: 30 minutes Critical care time was exclusive of separately billable procedures and treating other patients. Critical care was necessary to treat or prevent imminent or life-threatening deterioration. Critical care was time spent personally by me on the following activities: development of treatment plan with patient and/or surrogate as well as nursing, discussions with consultants, evaluation of patient's response to treatment, examination of patient, obtaining history from patient or surrogate, ordering and performing treatments and interventions, ordering and review of laboratory studies, ordering and review of radiographic studies, pulse oximetry and re-evaluation of patient's condition.  Final Clinical Impressions(s) / ED Diagnoses   Final diagnoses:  Alcoholism (HCC)  Alcoholic intoxication with complication [facility](HCC)  Depression,  unspecified depression type    ED Discharge Orders    None       Donnetta Hutchingook, Emeli Goguen, MD 05/27/18 1009    Donnetta Hutchingook, Cathy Ropp, MD 05/27/18 1139    Donnetta Hutchingook, Ryland Tungate, MD 05/30/18 [phone]

## 2018-05-27 NOTE — ED Notes (Signed)
Behavioral Health called and states recommending outpatient therapy.

## 2018-05-27 NOTE — ED Notes (Signed)
Pt yelling he wants to go home and he is being held against his will. Security and [place] [employer] at bedside. Unable to calm pt with deescalating techniques. Orders received from Dr Adriana Simas.

## 2018-05-27 NOTE — ED Notes (Addendum)
Pt crying in triage. States he doesn't have anything to live for and wants to die by hanging. Has drank 1/5 of liquor in the last day. Last drank a few hours ago. Smells of ETOH

## 2018-06-09 DIAGNOSIS — F4310 Post-traumatic stress disorder, unspecified: Secondary | ICD-10-CM | POA: Diagnosis not present

## 2018-06-09 DIAGNOSIS — F1020 Alcohol dependence, uncomplicated: Secondary | ICD-10-CM | POA: Diagnosis not present

## 2018-06-09 DIAGNOSIS — F4481 Dissociative identity disorder: Secondary | ICD-10-CM | POA: Diagnosis not present

## 2018-06-10 ENCOUNTER — Emergency Department (HOSPITAL_COMMUNITY)
Admission: EM | Admit: 2018-06-10 | Discharge: 2018-06-11 | Disposition: A | Discharge status: Other Acute Inpatient Hospital | Payer: BLUE CROSS/BLUE SHIELD | Source: Home / Self Care | Attending: Emergency Medicine | Admitting: Emergency Medicine

## 2018-06-10 ENCOUNTER — Encounter (HOSPITAL_COMMUNITY): Payer: Self-pay

## 2018-06-10 ENCOUNTER — Other Ambulatory Visit: Payer: Self-pay

## 2018-06-10 DIAGNOSIS — Y908 Blood alcohol level of 240 mg/100 ml or more: Secondary | ICD-10-CM | POA: Diagnosis not present

## 2018-06-10 DIAGNOSIS — Z20828 Contact with and (suspected) exposure to other viral communicable diseases: Secondary | ICD-10-CM | POA: Diagnosis not present

## 2018-06-10 DIAGNOSIS — F10929 Alcohol use, unspecified with intoxication, unspecified: Secondary | ICD-10-CM

## 2018-06-10 DIAGNOSIS — F332 Major depressive disorder, recurrent severe without psychotic features: Secondary | ICD-10-CM | POA: Diagnosis not present

## 2018-06-10 DIAGNOSIS — R45851 Suicidal ideations: Secondary | ICD-10-CM

## 2018-06-10 DIAGNOSIS — Z1159 Encounter for screening for other viral diseases: Secondary | ICD-10-CM | POA: Insufficient documentation

## 2018-06-10 DIAGNOSIS — F10229 Alcohol dependence with intoxication, unspecified: Secondary | ICD-10-CM | POA: Insufficient documentation

## 2018-06-10 DIAGNOSIS — F17210 Nicotine dependence, cigarettes, uncomplicated: Secondary | ICD-10-CM | POA: Insufficient documentation

## 2018-06-10 DIAGNOSIS — G4700 Insomnia, unspecified: Secondary | ICD-10-CM | POA: Diagnosis not present

## 2018-06-10 DIAGNOSIS — F329 Major depressive disorder, single episode, unspecified: Secondary | ICD-10-CM | POA: Insufficient documentation

## 2018-06-10 DIAGNOSIS — F10129 Alcohol abuse with intoxication, unspecified: Secondary | ICD-10-CM | POA: Diagnosis not present

## 2018-06-10 DIAGNOSIS — Z79899 Other long term (current) drug therapy: Secondary | ICD-10-CM | POA: Insufficient documentation

## 2018-06-10 DIAGNOSIS — F1024 Alcohol dependence with alcohol-induced mood disorder: Secondary | ICD-10-CM | POA: Diagnosis not present

## 2018-06-10 DIAGNOSIS — I10 Essential (primary) hypertension: Secondary | ICD-10-CM | POA: Insufficient documentation

## 2018-06-10 DIAGNOSIS — Z7982 Long term (current) use of aspirin: Secondary | ICD-10-CM | POA: Insufficient documentation

## 2018-06-10 DIAGNOSIS — F10239 Alcohol dependence with withdrawal, unspecified: Secondary | ICD-10-CM | POA: Diagnosis not present

## 2018-06-10 LAB — CBC WITH DIFFERENTIAL/PLATELET
Abs Immature Granulocytes: 0.02 10*3/uL (ref 0.00–0.07)
Basophils Absolute: 0.1 10*3/uL (ref 0.0–0.1)
Basophils Relative: 2 %
Eosinophils Absolute: 0.2 10*3/uL (ref 0.0–0.5)
Eosinophils Relative: 5 %
HCT: 48.1 % (ref 39.0–52.0)
Hemoglobin: 15.7 g/dL (ref 13.0–17.0)
Immature Granulocytes: 1 %
Lymphocytes Relative: 60 %
Lymphs Abs: 2.4 10*3/uL (ref 0.7–4.0)
MCH: 31.5 pg (ref 26.0–34.0)
MCHC: 32.6 g/dL (ref 30.0–36.0)
MCV: 96.4 fL (ref 80.0–100.0)
Monocytes Absolute: 0.3 10*3/uL (ref 0.1–1.0)
Monocytes Relative: 8 %
Neutro Abs: 1 10*3/uL — ABNORMAL LOW (ref 1.7–7.7)
Neutrophils Relative %: 24 %
Platelets: 199 10*3/uL (ref 150–400)
RBC: 4.99 MIL/uL (ref 4.22–5.81)
RDW: 14.9 % (ref 11.5–15.5)
WBC: 4 10*3/uL (ref 4.0–10.5)
nRBC: 0 % (ref 0.0–0.2)

## 2018-06-10 LAB — COMPREHENSIVE METABOLIC PANEL
ALT: 35 U/L (ref 0–44)
AST: 55 U/L — ABNORMAL HIGH (ref 15–41)
Albumin: 4.7 g/dL (ref 3.5–5.0)
Alkaline Phosphatase: 80 U/L (ref 38–126)
Anion gap: 17 — ABNORMAL HIGH (ref 5–15)
BUN: 11 mg/dL (ref 6–20)
CO2: 23 mmol/L (ref 22–32)
Calcium: 8.6 mg/dL — ABNORMAL LOW (ref 8.9–10.3)
Chloride: 103 mmol/L (ref 98–111)
Creatinine, Ser: 0.63 mg/dL (ref 0.61–1.24)
GFR calc Af Amer: >60 mL/min (ref >60)
GFR calc non Af Amer: >60 mL/min (ref >60)
Glucose, Bld: 96 mg/dL (ref 70–99)
Potassium: 3.4 mmol/L — ABNORMAL LOW (ref 3.5–5.1)
Sodium: 143 mmol/L (ref 135–145)
Total Bilirubin: 0.4 mg/dL (ref 0.3–1.2)
Total Protein: 8.1 g/dL (ref 6.5–8.1)

## 2018-06-10 LAB — ACETAMINOPHEN LEVEL: Acetaminophen (Tylenol), Serum: <10 ug/mL — ABNORMAL LOW (ref 10–30)

## 2018-06-10 LAB — ETHANOL: Alcohol, Ethyl (B): 387 mg/dL (ref <10)

## 2018-06-10 LAB — SALICYLATE LEVEL: Salicylate Lvl: <7 mg/dL (ref 2.8–30.0)

## 2018-06-10 MED ORDER — ACETAMINOPHEN 325 MG PO TABS: 650.0000 mg | ORAL_TABLET | ORAL | Status: DC | PRN

## 2018-06-10 MED ORDER — ALUM & MAG HYDROXIDE-SIMETH 200-200-20 MG/5ML PO SUSP: 30.0000 mL | Freq: Four times a day (QID) | ORAL | Status: DC | PRN

## 2018-06-10 MED ORDER — ONDANSETRON HCL 4 MG PO TABS
4.0000 mg | ORAL_TABLET | Freq: Three times a day (TID) | ORAL | Status: DC | PRN
  Administered 2018-06-11: 4 mg via ORAL
  Filled 2018-06-10: qty 1

## 2018-06-10 MED ORDER — NICOTINE 21 MG/24HR TD PT24
21.0000 mg | MEDICATED_PATCH | Freq: Every day | TRANSDERMAL | Status: DC
  Administered 2018-06-11: 21 mg via TRANSDERMAL
  Filled 2018-06-10: qty 1
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
## 2018-06-10 NOTE — ED Triage Notes (Signed)
Pt reports feeling depressed, states, "I don't fear death."  Pt reports to drinking alcohol today but says he can't truthfully tell me how much he has drank today.  Denies any pain.  Pt says, "I want to die."  Pt reports visual hallucinations.

## 2018-06-10 NOTE — ED Notes (Signed)
Donell Sievert, NP, patient meets inpatient criteria after medical clearance, BAL 387. TTS will then seek placement after medical clearance. Abram Sander, RN, informed of disposition.

## 2018-06-10 NOTE — ED Notes (Signed)
Pt stating he was wanting to leave. rn informed pt that he ws IVC and unable to leave at this time

## 2018-06-10 NOTE — ED Notes (Signed)
Date and time results received: 06/10/18 2155   Test: alcohol Critical Value: 387  Name of Provider Notified: EDP, Adriana Simas   Orders Received? Or Actions Taken?: n/a

## 2018-06-10 NOTE — ED Triage Notes (Signed)
Per [facility], pt had a plan of hanging himself.

## 2018-06-10 NOTE — ED Notes (Addendum)
Per TTS- inpatient placement when sober and medically cleared
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
## 2018-06-10 NOTE — ED Notes (Signed)
Pt given phone again

## 2018-06-10 NOTE — ED Notes (Signed)
Pt was wanded by security.  

## 2018-06-10 NOTE — ED Notes (Signed)
Pt given phone and number for sister.

## 2018-06-11 ENCOUNTER — Inpatient Hospital Stay (HOSPITAL_COMMUNITY)
Admission: AD | Admit: 2018-06-11 | Discharge: 2018-06-16 | DRG: 885 | Disposition: A | Discharge status: Home / Self Care | Payer: BLUE CROSS/BLUE SHIELD | Attending: Psychiatry | Admitting: Psychiatry

## 2018-06-11 ENCOUNTER — Other Ambulatory Visit: Payer: Self-pay | Admitting: Behavioral Health

## 2018-06-11 ENCOUNTER — Encounter (HOSPITAL_COMMUNITY): Payer: Self-pay | Admitting: Emergency Medicine

## 2018-06-11 DIAGNOSIS — F1024 Alcohol dependence with alcohol-induced mood disorder: Secondary | ICD-10-CM

## 2018-06-11 DIAGNOSIS — I10 Essential (primary) hypertension: Secondary | ICD-10-CM | POA: Diagnosis present

## 2018-06-11 DIAGNOSIS — F10239 Alcohol dependence with withdrawal, unspecified: Secondary | ICD-10-CM | POA: Diagnosis present

## 2018-06-11 DIAGNOSIS — R45851 Suicidal ideations: Secondary | ICD-10-CM | POA: Diagnosis present

## 2018-06-11 DIAGNOSIS — F332 Major depressive disorder, recurrent severe without psychotic features: Principal | ICD-10-CM | POA: Diagnosis present

## 2018-06-11 DIAGNOSIS — Y908 Blood alcohol level of 240 mg/100 ml or more: Secondary | ICD-10-CM | POA: Diagnosis present

## 2018-06-11 DIAGNOSIS — F1020 Alcohol dependence, uncomplicated: Secondary | ICD-10-CM | POA: Diagnosis not present

## 2018-06-11 DIAGNOSIS — F17210 Nicotine dependence, cigarettes, uncomplicated: Secondary | ICD-10-CM | POA: Diagnosis present

## 2018-06-11 DIAGNOSIS — F329 Major depressive disorder, single episode, unspecified: Secondary | ICD-10-CM | POA: Insufficient documentation

## 2018-06-11 DIAGNOSIS — G4700 Insomnia, unspecified: Secondary | ICD-10-CM | POA: Diagnosis present

## 2018-06-11 LAB — RAPID URINE DRUG SCREEN, HOSP PERFORMED
Amphetamines: NOT DETECTED
Barbiturates: NOT DETECTED
Benzodiazepines: NOT DETECTED
Cocaine: NOT DETECTED
Opiates: NOT DETECTED
Tetrahydrocannabinol: NOT DETECTED

## 2018-06-11 LAB — SARS CORONAVIRUS 2 BY RT PCR (HOSPITAL ORDER, PERFORMED IN ~~LOC~~ HOSPITAL LAB): SARS Coronavirus 2: NEGATIVE

## 2018-06-11 MED ORDER — PROMETHAZINE HCL 25 MG/ML IJ SOLN
INTRAMUSCULAR | Status: AC
  Filled 2018-06-11: qty 1

## 2018-06-11 MED ORDER — BREXPIPRAZOLE 2 MG PO TABS: 2.0000 mg | ORAL_TABLET | Freq: Every morning | ORAL | Status: DC

## 2018-06-11 MED ORDER — HYDROXYZINE HCL 25 MG PO TABS
25.0000 mg | ORAL_TABLET | Freq: Four times a day (QID) | ORAL | Status: AC | PRN
  Administered 2018-06-11 – 2018-06-13 (×4): 25 mg via ORAL
  Filled 2018-06-11 (×4): qty 1

## 2018-06-11 MED ORDER — VITAMIN B-1 100 MG PO TABS
100.0000 mg | ORAL_TABLET | Freq: Every day | ORAL | Status: DC
  Administered 2018-06-11: 100 mg via ORAL
  Filled 2018-06-11: qty 1

## 2018-06-11 MED ORDER — CARVEDILOL 25 MG PO TABS
25.0000 mg | ORAL_TABLET | Freq: Two times a day (BID) | ORAL | Status: DC
  Administered 2018-06-11 – 2018-06-12 (×2): 25 mg via ORAL
  Filled 2018-06-11 (×3): qty 1
  Filled 2018-06-11: qty 2
  Filled 2018-06-11: qty 1

## 2018-06-11 MED ORDER — BREXPIPRAZOLE 0.25 MG HALF TABLET
2.0000 mg | ORAL_TABLET | Freq: Every day | ORAL | Status: DC
  Filled 2018-06-11: qty 8

## 2018-06-11 MED ORDER — ONDANSETRON 4 MG PO TBDP: 4.0000 mg | ORAL_TABLET | Freq: Four times a day (QID) | ORAL | Status: AC | PRN

## 2018-06-11 MED ORDER — THIAMINE HCL 100 MG/ML IJ SOLN
100.0000 mg | Freq: Once | INTRAMUSCULAR | Status: AC
  Administered 2018-06-11: 100 mg via INTRAMUSCULAR
  Filled 2018-06-11: qty 2

## 2018-06-11 MED ORDER — VITAMIN B-1 100 MG PO TABS
100.0000 mg | ORAL_TABLET | Freq: Every day | ORAL | Status: DC
  Administered 2018-06-12 – 2018-06-16 (×5): 100 mg via ORAL
  Filled 2018-06-11 (×7): qty 1

## 2018-06-11 MED ORDER — ALUM & MAG HYDROXIDE-SIMETH 200-200-20 MG/5ML PO SUSP: 30.0000 mL | ORAL | Status: DC | PRN

## 2018-06-11 MED ORDER — CARVEDILOL 12.5 MG PO TABS
25.0000 mg | ORAL_TABLET | Freq: Two times a day (BID) | ORAL | Status: DC
  Administered 2018-06-11: 25 mg via ORAL
  Filled 2018-06-11: qty 2

## 2018-06-11 MED ORDER — BREXPIPRAZOLE 1 MG PO TABS: 2.0000 mg | ORAL_TABLET | Freq: Every day | ORAL | Status: DC

## 2018-06-11 MED ORDER — LOPERAMIDE HCL 2 MG PO CAPS
2.0000 mg | ORAL_CAPSULE | ORAL | Status: AC | PRN
  Administered 2018-06-12: 4 mg via ORAL
  Filled 2018-06-11: qty 1

## 2018-06-11 MED ORDER — BREXPIPRAZOLE 1 MG PO TABS
2.0000 mg | ORAL_TABLET | Freq: Every day | ORAL | Status: DC
  Filled 2018-06-11 (×3): qty 2

## 2018-06-11 MED ORDER — THIAMINE HCL 100 MG/ML IJ SOLN: 100.0000 mg | Freq: Every day | INTRAMUSCULAR | Status: DC

## 2018-06-11 MED ORDER — BREXPIPRAZOLE 0.25 MG HALF TABLET
1.0000 mg | ORAL_TABLET | Freq: Every day | ORAL | Status: DC
  Filled 2018-06-11 (×2): qty 4

## 2018-06-11 MED ORDER — FOLIC ACID 1 MG PO TABS
1.0000 mg | ORAL_TABLET | Freq: Every day | ORAL | Status: DC
  Administered 2018-06-11: 1 mg via ORAL
  Filled 2018-06-11: qty 1

## 2018-06-11 MED ORDER — ADULT MULTIVITAMIN W/MINERALS CH
1.0000 | ORAL_TABLET | Freq: Every day | ORAL | Status: DC
  Administered 2018-06-11: 1 via ORAL
  Filled 2018-06-11: qty 1

## 2018-06-11 MED ORDER — VILAZODONE HCL 40 MG PO TABS: 40.0000 mg | ORAL_TABLET | Freq: Every day | ORAL | Status: DC

## 2018-06-11 MED ORDER — ASPIRIN EC 81 MG PO TBEC
81.0000 mg | DELAYED_RELEASE_TABLET | Freq: Every day | ORAL | Status: DC
  Administered 2018-06-11: 81 mg via ORAL
  Filled 2018-06-11: qty 1

## 2018-06-11 MED ORDER — LORAZEPAM 1 MG PO TABS
1.0000 mg | ORAL_TABLET | Freq: Four times a day (QID) | ORAL | Status: DC | PRN
  Administered 2018-06-11: 1 mg via ORAL
  Filled 2018-06-11: qty 1

## 2018-06-11 MED ORDER — VILAZODONE HCL 20 MG PO TABS
40.0000 mg | ORAL_TABLET | Freq: Every day | ORAL | Status: DC
  Filled 2018-06-11 (×3): qty 2

## 2018-06-11 MED ORDER — LORAZEPAM 1 MG PO TABS
1.0000 mg | ORAL_TABLET | Freq: Two times a day (BID) | ORAL | Status: AC
  Administered 2018-06-14 (×2): 1 mg via ORAL
  Filled 2018-06-11 (×2): qty 1

## 2018-06-11 MED ORDER — LORAZEPAM 1 MG PO TABS
1.0000 mg | ORAL_TABLET | Freq: Four times a day (QID) | ORAL | Status: AC | PRN
  Administered 2018-06-12: 1 mg via ORAL
  Filled 2018-06-11: qty 1

## 2018-06-11 MED ORDER — ADULT MULTIVITAMIN W/MINERALS CH: 1.0000 | ORAL_TABLET | Freq: Every day | ORAL | Status: DC

## 2018-06-11 MED ORDER — PNEUMOCOCCAL VAC POLYVALENT 25 MCG/0.5ML IJ INJ: 0.5000 mL | INJECTION | INTRAMUSCULAR | Status: DC

## 2018-06-11 MED ORDER — NICOTINE POLACRILEX 2 MG MT GUM: 2.0000 mg | CHEWING_GUM | OROMUCOSAL | Status: DC | PRN

## 2018-06-11 MED ORDER — LORAZEPAM 1 MG PO TABS
1.0000 mg | ORAL_TABLET | Freq: Three times a day (TID) | ORAL | Status: AC
  Administered 2018-06-13 (×3): 1 mg via ORAL
  Filled 2018-06-11 (×3): qty 1

## 2018-06-11 MED ORDER — LORAZEPAM 1 MG PO TABS
1.0000 mg | ORAL_TABLET | Freq: Every day | ORAL | Status: AC
  Administered 2018-06-15: 1 mg via ORAL
  Filled 2018-06-11: qty 1

## 2018-06-11 MED ORDER — ACETAMINOPHEN 325 MG PO TABS: 650.0000 mg | ORAL_TABLET | Freq: Four times a day (QID) | ORAL | Status: DC | PRN

## 2018-06-11 MED ORDER — LORAZEPAM 1 MG PO TABS
1.0000 mg | ORAL_TABLET | Freq: Four times a day (QID) | ORAL | Status: AC
  Administered 2018-06-11 – 2018-06-12 (×6): 1 mg via ORAL
  Filled 2018-06-11 (×6): qty 1

## 2018-06-11 MED ORDER — LORAZEPAM 2 MG/ML IJ SOLN: 1.0000 mg | Freq: Four times a day (QID) | INTRAMUSCULAR | Status: DC | PRN

## 2018-06-11 MED ORDER — ADULT MULTIVITAMIN W/MINERALS CH
1.0000 | ORAL_TABLET | Freq: Every day | ORAL | Status: DC
  Administered 2018-06-11 – 2018-06-16 (×6): 1 via ORAL
  Filled 2018-06-11 (×9): qty 1

## 2018-06-11 MED ORDER — VITAMIN D 25 MCG (1000 UNIT) PO TABS
2000.0000 [IU] | ORAL_TABLET | Freq: Every day | ORAL | Status: DC
  Administered 2018-06-11: 2000 [IU] via ORAL
  Filled 2018-06-11 (×3): qty 2

## 2018-06-11 MED ORDER — LORAZEPAM 2 MG/ML IJ SOLN
1.0000 mg | Freq: Once | INTRAMUSCULAR | Status: AC
  Administered 2018-06-11: 1 mg via INTRAMUSCULAR
  Filled 2018-06-11: qty 1

## 2018-06-11 MED ORDER — PROMETHAZINE HCL 25 MG/ML IJ SOLN
12.5000 mg | Freq: Once | INTRAMUSCULAR | Status: AC
  Administered 2018-06-11: 12.5 mg via INTRAMUSCULAR

## 2018-06-11 NOTE — Progress Notes (Signed)
Pt accepted to [facility], [address] Donell Sievert, PA is the accepting provider.  Nehemiah Massed, MD is the attending provider.  Call report to [phone]  [facility] @ [facility] notified.   Pt is IVC .  Pt may be transported by [facility] Pt scheduled  to arrive at [facility] WHEN MEDICALLY CLEARED  Carney Bern T. Kaylyn Lim, MSW, LCSW Disposition Clinical Social Work [phone] (cell) [phone] (office)

## 2018-06-11 NOTE — Progress Notes (Signed)
Patient ID: Parthiv Mclemore, male   DOB: [birth year], 43 y.o.   MRN: [id number]   [place] NOVEL CORONAVIRUS (COVID-19) DAILY CHECK-OFF SYMPTOMS - answer yes or no to each - every day NO YES  Have you had a fever in the past 24 hours?  . Fever (Temp > 37.80C / 100F) X   Have you had any of these symptoms in the past 24 hours? . New Cough .  Sore Throat  .  Shortness of Breath .  Difficulty Breathing .  Unexplained Body Aches   X   Have you had any one of these symptoms in the past 24 hours not related to allergies?   . Runny Nose .  Nasal Congestion .  Sneezing   X   If you have had runny nose, nasal congestion, sneezing in the past 24 hours, has it worsened?  X   EXPOSURES - check yes or no X   Have you traveled outside the state in the past 14 days?  X   Have you been in contact with someone with a confirmed diagnosis of COVID-19 or PUI in the past 14 days without wearing appropriate PPE?  X   Have you been living in the same home as a person with confirmed diagnosis of COVID-19 or a PUI (household contact)?    X   Have you been diagnosed with COVID-19?    X              What to do next: Answered NO to all: Answered YES to anything:   Proceed with unit schedule Follow the [facility] Inpatient Flowsheet.

## 2018-06-11 NOTE — Progress Notes (Signed)
D: Pt was in dayroom upon initial approach.  Pt presents with depressed affect and mood.  He report his day was "all right" and his goal is to "get a good nights sleep."  Pt denies SI/HI, denies hallucinations, denies pain.  Pt has been visible in milieu interacting with peers and staff appropriately.  Pt attended evening group.    A: Introduced self to pt.  Met with pt 1:1.  Actively listened to pt and offered support and encouragement.  Medication administered per order.  Q15 minute safety checks in place.  R: Pt is safe on the unit.  Pt is compliant with medication.  Pt verbally contracts for safety.  Will continue to monitor and assess.

## 2018-06-11 NOTE — Progress Notes (Signed)
   06/11/18 2150  COVID-19 Daily Checkoff  Have you had a fever (temp > 37.80C/100F)  in the past 24 hours?  No  COVID-19 EXPOSURE  Have you traveled outside the state in the past 14 days? No  Have you been in contact with someone with a confirmed diagnosis of COVID-19 or PUI in the past 14 days without wearing appropriate PPE? No  Have you been living in the same home as a person with confirmed diagnosis of COVID-19 or a PUI (household contact)? No  Have you been diagnosed with COVID-19? No

## 2018-06-11 NOTE — ED Notes (Signed)
Pt having recurrent emesis after zofran. Ativan given per [facility] order

## 2018-06-11 NOTE — ED Notes (Signed)
Called [facility] for transport to [facility]. 
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
## 2018-06-11 NOTE — Progress Notes (Signed)
Adult Psychoeducational Group Note  Date:  06/11/2018 Time:  9:02 PM  Group Topic/Focus:  Wrap-Up Group:   The focus of this group is to help patients review their daily goal of treatment and discuss progress on daily workbooks.  Participation Level:  Active  Participation Quality:  Appropriate  Affect:  Appropriate  Cognitive:  Appropriate  Insight: Appropriate  Engagement in Group:  Engaged  Modes of Intervention:  Discussion  Additional Comments:  Pt was admitted today and does not have set goals.  Pt rated the day at a 1/10.  Roniel Halloran 06/11/2018, 9:02 PM

## 2018-06-11 NOTE — ED Provider Notes (Signed)
Pt received at sign out from previous shift. Pt with multiple episodes of N/V despite meds, elevated CIWA score (17). IM ativan given with good effect. CIWA score improved (now 1) and pt is able to tol PO well without N/V. Medically clear for psych dispo.  BP (!) 155/80   Pulse 94   Temp 99 F (37.2 C) (Oral)   Resp 18   Ht 5\' 8"  (1.727 m)   Wt 108.9 kg   SpO2 97%   BMI 36.49 kg/m     Samuel Jester, DO 06/11/18 1120

## 2018-06-11 NOTE — ED Notes (Signed)
Patient had episode of vomiting at approximately 0745. Pt vomited 150 cc of gastric fluid.

## 2018-06-11 NOTE — Tx Team (Signed)
Initial Treatment Plan 06/11/2018 6:47 PM Malik Edwards NAT:[id number]    PATIENT STRESSORS: Financial difficulties Substance abuse   PATIENT STRENGTHS: Ability for insight Communication skills Motivation for treatment/growth   PATIENT IDENTIFIED PROBLEMS: Substance Abuse  "Anxiety"  "No sleep"  "Depression"               DISCHARGE CRITERIA:  Ability to meet basic life and health needs Improved stabilization in mood, thinking, and/or behavior Reduction of life-threatening or endangering symptoms to within safe limits Verbal commitment to aftercare and medication compliance Withdrawal symptoms are absent or subacute and managed without 24-hour nursing intervention  PRELIMINARY DISCHARGE PLAN: Outpatient therapy Return to previous living arrangement  PATIENT/FAMILY INVOLVEMENT: This treatment plan has been presented to and reviewed with the patient, Malik Edwards, and/or family member.  The patient and family have been given the opportunity to ask questions and make suggestions.  Tania Ade, RN 06/11/2018, 6:47 PM

## 2018-06-11 NOTE — ED Provider Notes (Signed)
4:51 AM Assumed care from Drs. Adriana Simas and [employer], please see their note for full history, physical and decision making until this point. In brief this is a 43 y.o. year old male who presented to the ED tonight with V70.1     Patient more sober. Labs reassuring, mild AG but likely related to drinking/dehydration.  Medically clear for Psychiatric disposition at this time. covid test ordered and pending.   Vitals:   06/10/18 1833 06/11/18 0042 06/11/18 0458 06/11/18 0516  BP: (!) 146/94 (!) 151/87 (!) 173/93   Pulse: 96 (!) 124 (!) 105 (!) 110  Resp: 18  18   Temp: 98 F (36.7 C)  97.8 F (36.6 C)   TempSrc: Oral  Oral   SpO2: 94% 94% 92% 96%  Weight:      Height:        Labs, studies and imaging reviewed by myself and considered in medical decision making if ordered. Imaging interpreted by radiology.  Labs Reviewed  COMPREHENSIVE METABOLIC PANEL - Abnormal; Notable for the following components:      Result Value   Potassium 3.4 (*)    Calcium 8.6 (*)    AST 55 (*)    Anion gap 17 (*)    All other components within normal limits  ETHANOL - Abnormal; Notable for the following components:   Alcohol, Ethyl (B) 387 (*)    All other components within normal limits  CBC WITH DIFFERENTIAL/PLATELET - Abnormal; Notable for the following components:   Neutro Abs 1.0 (*)    All other components within normal limits  ACETAMINOPHEN LEVEL - Abnormal; Notable for the following components:   Acetaminophen (Tylenol), Serum <10 (*)    All other components within normal limits  SARS CORONAVIRUS 2 (HOSPITAL ORDER, PERFORMED IN [facility])  RAPID URINE DRUG SCREEN, HOSP PERFORMED  SALICYLATE LEVEL    No orders to display    No follow-ups on file.    Jacilyn Sanpedro, Barbara Cower, MD 06/11/18 [phone]

## 2018-06-12 DIAGNOSIS — F332 Major depressive disorder, recurrent severe without psychotic features: Principal | ICD-10-CM

## 2018-06-12 DIAGNOSIS — F1020 Alcohol dependence, uncomplicated: Secondary | ICD-10-CM

## 2018-06-12 LAB — LIPID PANEL
Cholesterol: 256 mg/dL — ABNORMAL HIGH (ref 0–200)
HDL: 117 mg/dL (ref >40)
LDL Cholesterol: 127 mg/dL — ABNORMAL HIGH (ref 0–99)
Total CHOL/HDL Ratio: 2.2 RATIO
Triglycerides: 60 mg/dL (ref <150)
VLDL: 12 mg/dL (ref 0–40)

## 2018-06-12 LAB — TSH: TSH: 4.02 u[IU]/mL (ref 0.350–4.500)

## 2018-06-12 LAB — HEMOGLOBIN A1C
Hgb A1c MFr Bld: 5.1 % (ref 4.8–5.6)
Mean Plasma Glucose: 99.67 mg/dL

## 2018-06-12 MED ORDER — PRAZOSIN HCL 2 MG PO CAPS
2.0000 mg | ORAL_CAPSULE | Freq: Every day | ORAL | Status: DC
  Administered 2018-06-12: 2 mg via ORAL
  Filled 2018-06-12 (×2): qty 1

## 2018-06-12 MED ORDER — TEMAZEPAM 15 MG PO CAPS
30.0000 mg | ORAL_CAPSULE | Freq: Every day | ORAL | Status: DC
  Administered 2018-06-12 – 2018-06-15 (×4): 30 mg via ORAL
  Filled 2018-06-12 (×4): qty 2

## 2018-06-12 MED ORDER — NICOTINE 21 MG/24HR TD PT24
21.0000 mg | MEDICATED_PATCH | Freq: Every day | TRANSDERMAL | Status: DC
  Administered 2018-06-12 – 2018-06-13 (×2): 21 mg via TRANSDERMAL
  Filled 2018-06-12 (×3): qty 1

## 2018-06-12 MED ORDER — BREXPIPRAZOLE 1 MG PO TABS
1.0000 mg | ORAL_TABLET | Freq: Every day | ORAL | Status: DC
  Administered 2018-06-12: 1 mg via ORAL
  Filled 2018-06-12: qty 1

## 2018-06-12 MED ORDER — VORTIOXETINE HBR 10 MG PO TABS
10.0000 mg | ORAL_TABLET | Freq: Every day | ORAL | Status: DC
  Administered 2018-06-12 – 2018-06-16 (×5): 10 mg via ORAL
  Filled 2018-06-12 (×7): qty 1

## 2018-06-12 MED ORDER — CARVEDILOL 25 MG PO TABS
50.0000 mg | ORAL_TABLET | Freq: Two times a day (BID) | ORAL | Status: DC
  Administered 2018-06-12 – 2018-06-16 (×9): 50 mg via ORAL
  Filled 2018-06-12 (×12): qty 2

## 2018-06-12 MED ORDER — CARIPRAZINE HCL 1.5 MG PO CAPS
1.5000 mg | ORAL_CAPSULE | Freq: Every day | ORAL | Status: DC
  Administered 2018-06-12 – 2018-06-14 (×3): 1.5 mg via ORAL
  Filled 2018-06-12 (×5): qty 1
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
## 2018-06-12 NOTE — [facility] Group Notes (Signed)
[facility] LCSW Group Therapy Note  Date/Time: 06/12/2018 ; 1:30pm  Type of Therapy/Topic:  Group Therapy:  Feelings about Diagnosis  Participation Level:  Minimal   Mood: Pleasant   Description of Group:    This group will allow patients to explore their thoughts and feelings about diagnoses they have received. Patients will be guided to explore their level of understanding and acceptance of these diagnoses. Facilitator will encourage patients to process their thoughts and feelings about the reactions of others to their diagnosis, and will guide patients in identifying ways to discuss their diagnosis with significant others in their lives. This group will be process-oriented, with patients participating in exploration of their own experiences as well as giving and receiving support and challenge from other group members.   Therapeutic Goals: 1. Patient will demonstrate understanding of diagnosis as evidence by identifying two or more symptoms of the disorder:  2. Patient will be able to express two feelings regarding the diagnosis 3. Patient will demonstrate ability to communicate their needs through discussion and/or role plays  Summary of Patient Progress:   Pt was engaged throughout group. Pt did not talk much during group but was able to explore his thoughts and feelings around stigma that comes with having a mental health diagnosis.      Therapeutic Modalities:   Cognitive Behavioral Therapy Brief Therapy Feelings Identification   Stephannie Peters, LCSW

## 2018-06-12 NOTE — Progress Notes (Signed)
Patient ID: Malik Edwards, male   DOB: 05/10/1975, 43 y.o.   MRN: [id number]   [place] NOVEL CORONAVIRUS (COVID-19) DAILY CHECK-OFF SYMPTOMS - answer yes or no to each - every day NO YES  Have you had a fever in the past 24 hours?  . Fever (Temp > 37.80C / 100F) X   Have you had any of these symptoms in the past 24 hours? . New Cough .  Sore Throat  .  Shortness of Breath .  Difficulty Breathing .  Unexplained Body Aches   X   Have you had any one of these symptoms in the past 24 hours not related to allergies?   . Runny Nose .  Nasal Congestion .  Sneezing   X   If you have had runny nose, nasal congestion, sneezing in the past 24 hours, has it worsened?  X   EXPOSURES - check yes or no X   Have you traveled outside the state in the past 14 days?  X   Have you been in contact with someone with a confirmed diagnosis of COVID-19 or PUI in the past 14 days without wearing appropriate PPE?  X   Have you been living in the same home as a person with confirmed diagnosis of COVID-19 or a PUI (household contact)?    X   Have you been diagnosed with COVID-19?    X              What to do next: Answered NO to all: Answered YES to anything:   Proceed with unit schedule Follow the [facility] Inpatient Flowsheet.   
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
## 2018-06-12 NOTE — Progress Notes (Signed)
D: Pt was in dayroom upon initial approach.  Pt presents with depressed affect and mood.  He reports his day was "stressful, frustrating."  He reports "my big thing is my therapist outside of here wants me to go to an extended place in [place] when I leave here."  Pt expresses concern with this plan and reports he plans to discuss with social worker at [facility] tomorrow so they can coordinate with his therapist, Mickel Baas.  His goal tonight is "just to chill."  Pt denies SI/HI, denies hallucinations, denies pain, exhibits withdrawal symptoms of tremor, sweats, and anxiety.  He was encouraged to write down what he would like to discuss with Education officer, museum.  Pt has been visible in milieu interacting with peers and staff appropriately.  Pt attended evening group.    A: Met with pt 1:1.  Actively listened to pt and offered support and encouragement. Medications administered per order.  Q15 minute safety checks maintained.  R: Pt is safe on the unit.  Pt is compliant with medications.  Pt verbally contracts for safety.  Will continue to monitor and assess.

## 2018-06-12 NOTE — Progress Notes (Addendum)
Patient ID: [employer], male   DOB: 16-Aug-1975, 43 y.o.   MRN: [id number]  D: Patient denies SI/HI but reports hallucinations. Patient has a depressed mood and affect. Patient given new medications and given education regarding these medications. Patient enthusiastic and hopeful regarding new meds.  A: Patient given emotional support from RN. Patient given medications per MD orders. Patient encouraged to attend groups and unit activities. Patient encouraged to come to staff with any questions or concerns.  R: Patient remains cooperative and appropriate. Will continue to monitor patient for safety.

## 2018-06-12 NOTE — Plan of Care (Signed)
  Problem: Activity: Goal: Sleeping patterns will improve Outcome: Not Progressing Note:  Pt slept 3.5 hours last night.

## 2018-06-13 MED ORDER — TRAZODONE HCL 100 MG PO TABS
100.0000 mg | ORAL_TABLET | Freq: Every evening | ORAL | Status: DC | PRN
  Administered 2018-06-13 – 2018-06-15 (×4): 100 mg via ORAL
  Filled 2018-06-13 (×4): qty 1

## 2018-06-13 MED ORDER — PRAZOSIN HCL 5 MG PO CAPS
5.0000 mg | ORAL_CAPSULE | Freq: Every day | ORAL | Status: DC
  Administered 2018-06-13 – 2018-06-15 (×3): 5 mg via ORAL
  Filled 2018-06-13 (×6): qty 1

## 2018-06-13 MED ORDER — NICOTINE 21 MG/24HR TD PT24: 21.0000 mg | MEDICATED_PATCH | Freq: Every day | TRANSDERMAL | Status: DC

## 2018-06-13 MED ORDER — NICOTINE 21 MG/24HR TD PT24
21.0000 mg | MEDICATED_PATCH | Freq: Every day | TRANSDERMAL | Status: DC
  Administered 2018-06-14 – 2018-06-16 (×3): 21 mg via TRANSDERMAL
  Filled 2018-06-13 (×5): qty 1

## 2018-06-13 NOTE — Progress Notes (Signed)
Pt complains of difficulty sleeping.  He reports "I fell asleep for about an hour but I can't go back to sleep.  I don't feel like sitting here awake for 6 hours."  Pt reports that he has taken sleeping medications in the past but they make him feel "hungover, groggy" in the morning and he can't function at work like this.  He states "but that's not an issue here.  I just want to go to sleep."  On-site provider notified and new orders placed.  Trazodone 100 mg PO PRN was administered.  Pt stated "thank you."  Will continue to monitor and assess.

## 2018-06-13 NOTE — Progress Notes (Signed)
[facility] MD Progress Note  06/13/2018 9:11 AM Malik Edwards  MRN:  [id number] Subjective:   Patient states that overall his detox is progressing he states that he felt he already had a head start on the detox because of being held in the emergency department at any rate he is alert and oriented without thoughts of self-harm today he states he also likes the new antidepressant is only been a couple of doses but he is doing well with it he also states that he would like to go to a treatment facility in [place] that is famous and he would like to arrange this and as soon as it is available he would like to leave Principal Problem: Reports PTSD, alcohol dependence Diagnosis: Active Problems:   MDD (major depressive disorder)  Total Time spent with patient: 20 minutes  Past Medical History:  Past Medical History:  Diagnosis Date  . Alcoholic (HCC)   . Anxiety   . Atrial flutter (HCC) 2013  . Cardiomyopathy (HCC)   . Colitis   . Depression   . High cholesterol   . Hypertension   . OCD (obsessive compulsive disorder)   . OSA (obstructive sleep apnea) 01/22/2013  . PTSD (post-traumatic stress disorder)     Past Surgical History:  Procedure Laterality Date  . CARDIOVERSION N/A 09/15/2012   Procedure: CARDIOVERSION;  Surgeon: Chrystie Nose, MD;  Location: [facility] OR;  Service: Cardiovascular;  Laterality: N/A;   Family History:  Family History  Problem Relation Age of Onset  . Healthy Mother   . Healthy Father   . Cancer Father        prostate cancer  . Heart attack Maternal Grandmother   . Heart attack Maternal Grandfather   . Heart attack Paternal Grandmother   . Sudden Cardiac Death Neg Hx    Family Psychiatric  History: neg Social History:  Social History   Substance and Sexual Activity  Alcohol Use Yes  . Alcohol/week: 12.0 standard drinks  . Types: 12 Standard drinks or equivalent per week   Comment: 1/2 gallon liquor per day. drinks about a fifth about 3 times a week.      Social History   Substance and Sexual Activity  Drug Use No   Comment: denied all drug use    Social History   Socioeconomic History  . Marital status: Married    Spouse name: Kelvon Denkins  . Number of children: 0  . Years of education: Assoc. Deg  . Highest education level: Not on file  Occupational History  . Occupation: Forensic scientist: OTHER    Comment: Mudlogger  Social Needs  . Financial resource strain: Not on file  . Food insecurity:    Worry: Not on file    Inability: Not on file  . Transportation needs:    Medical: Not on file    Non-medical: Not on file  Tobacco Use  . Smoking status: Current Every Day Smoker    Packs/day: 0.25    Years: 20.00    Pack years: 5.00    Types: Cigarettes  . Smokeless tobacco: Never Used  Substance and Sexual Activity  . Alcohol use: Yes    Alcohol/week: 12.0 standard drinks    Types: 12 Standard drinks or equivalent per week    Comment: 1/2 gallon liquor per day. drinks about a fifth about 3 times a week.  . Drug use: No    Comment: denied all drug use  . Sexual activity:  Yes    Partners: Female    Birth control/protection: None    Comment: wife  Lifestyle  . Physical activity:    Days per week: Not on file    Minutes per session: Not on file  . Stress: Not on file  Relationships  . Social connections:    Talks on phone: Not on file    Gets together: Not on file    Attends religious service: Not on file    Active member of club or organization: Not on file    Attends meetings of clubs or organizations: Not on file    Relationship status: Not on file  Other Topics Concern  . Not on file  Social History Narrative   Patient lives at home with spouse.   Caffeine Use: 1-2 cups daily   Additional Social History:                         Sleep: Good  Appetite:  Good  Current Medications: Current Facility-Administered Medications  Medication Dose Route Frequency Provider Last Rate Last  Dose  . acetaminophen (TYLENOL) tablet 650 mg  650 mg Oral Q6H PRN Denzil Magnusonhomas, Lashunda, NP      . alum & mag hydroxide-simeth (MAALOX/MYLANTA) 200-200-20 MG/5ML suspension 30 mL  30 mL Oral Q4H PRN Denzil Magnusonhomas, Lashunda, NP      . cariprazine (VRAYLAR) capsule 1.5 mg  1.5 mg Oral Daily Malvin JohnsFarah, Krysten Veronica, MD   1.5 mg at 06/13/18 0826  . carvedilol (COREG) tablet 50 mg  50 mg Oral BID WC Malvin JohnsFarah, Lujain Kraszewski, MD   50 mg at 06/13/18 0825  . hydrOXYzine (ATARAX/VISTARIL) tablet 25 mg  25 mg Oral Q6H PRN Denzil Magnusonhomas, Lashunda, NP   25 mg at 06/13/18 0024  . loperamide (IMODIUM) capsule 2-4 mg  2-4 mg Oral PRN Denzil Magnusonhomas, Lashunda, NP   4 mg at 06/12/18 1814  . LORazepam (ATIVAN) tablet 1 mg  1 mg Oral Q6H PRN Denzil Magnusonhomas, Lashunda, NP   1 mg at 06/12/18 0021  . LORazepam (ATIVAN) tablet 1 mg  1 mg Oral TID Denzil Magnusonhomas, Lashunda, NP   1 mg at 06/13/18 [id number]   Followed by  . [START ON 06/14/2018] LORazepam (ATIVAN) tablet 1 mg  1 mg Oral BID Denzil Magnusonhomas, Lashunda, NP       Followed by  . [START ON 06/15/2018] LORazepam (ATIVAN) tablet 1 mg  1 mg Oral Daily Denzil Magnusonhomas, Lashunda, NP      . multivitamin with minerals tablet 1 tablet  1 tablet Oral Daily Denzil Magnusonhomas, Lashunda, NP   1 tablet at 06/13/18 0826  . nicotine (NICODERM CQ - dosed in mg/24 hours) patch 21 mg  21 mg Transdermal Daily Cobos, Rockey SituFernando A, MD   21 mg at 06/13/18 0849  . nicotine polacrilex (NICORETTE) gum 2 mg  2 mg Oral PRN Cobos, Rockey SituFernando A, MD      . ondansetron (ZOFRAN-ODT) disintegrating tablet 4 mg  4 mg Oral Q6H PRN Denzil Magnusonhomas, Lashunda, NP      . pneumococcal 23 valent vaccine (PNU-IMMUNE) injection 0.5 mL  0.5 mL Intramuscular Tomorrow-1000 Cobos, Fernando A, MD      . prazosin (MINIPRESS) capsule 2 mg  2 mg Oral QHS Malvin JohnsFarah, Dalen [place]enn[place], MD   2 mg at 06/12/18 2125  . temazepam (RESTORIL) capsule 30 mg  30 mg Oral QHS Malvin JohnsFarah, Mckayla Mulcahey, MD   30 mg at 06/12/18 2125  . thiamine (VITAMIN B-1) tablet 100 mg  100 mg Oral Daily Denzil Magnusonhomas, Lashunda, NP  100 mg at 06/13/18 0825  . traZODone (DESYREL) tablet  100 mg  100 mg Oral QHS PRN,MR X 1 Donell Sievert E, PA-C   100 mg at 06/13/18 0045  . vortioxetine HBr (TRINTELLIX) tablet 10 mg  10 mg Oral Daily Malvin Johns, MD   10 mg at 06/13/18 6045    Lab Results:  Results for orders placed or performed during the hospital encounter of 06/11/18 (from the past 48 hour(s))  Hemoglobin A1c     Status: None   Collection Time: 06/12/18  6:28 AM  Result Value Ref Range   Hgb A1c MFr Bld 5.1 4.8 - 5.6 %    Comment: (NOTE) Pre diabetes:          5.7%-6.4% Diabetes:              >6.4% Glycemic control for   <7.0% adults with diabetes    Mean Plasma Glucose 99.67 mg/dL    Comment: Performed at [facility], [address], [place], [place] [address]  Lipid panel     Status: Abnormal   Collection Time: 06/12/18  6:28 AM  Result Value Ref Range   Cholesterol 256 (H) 0 - 200 mg/dL   Triglycerides 60 <191 mg/dL   HDL 478 >29 mg/dL   Total CHOL/HDL Ratio 2.2 RATIO   VLDL 12 0 - 40 mg/dL   LDL Cholesterol 562 (H) 0 - 99 mg/dL    Comment:        Total Cholesterol/HDL:CHD Risk Coronary Heart Disease Risk Table                     Men   Women  1/2 Average Risk   3.4   3.3  Average Risk       5.0   4.4  2 X Average Risk   9.6   7.1  3 X Average Risk  23.4   11.0        Use the calculated Patient Ratio above and the CHD Risk Table to determine the patient's CHD Risk.        ATP III CLASSIFICATION (LDL):  <100     mg/dL   Optimal  130-865  mg/dL   Near or Above                    Optimal  130-159  mg/dL   Borderline  784-696  mg/dL   High  >295     mg/dL   Very High Performed at [facility], [address] [place], [place] [address]   TSH     Status: None   Collection Time: 06/12/18  6:28 AM  Result Value Ref Range   TSH 4.020 0.350 - 4.500 uIU/mL    Comment: Performed by a 3rd Generation assay with a functional sensitivity of <=0.01 uIU/mL. Performed at [facility], [address]  [place], [place] [address]     Blood Alcohol level:  Lab Results  Component Value Date   ETH 387 [facility]) 06/10/2018   ETH 281 (H) 05/27/2018    Metabolic Disorder Labs: Lab Results  Component Value Date   HGBA1C 5.1 06/12/2018   MPG 99.67 06/12/2018   MPG 111 01/14/2015   Lab Results  Component Value Date   PROLACTIN 14.8 01/14/2015   Lab Results  Component Value Date   CHOL 256 (H) 06/12/2018   TRIG 60 06/12/2018   HDL 117 06/12/2018   CHOLHDL 2.2 06/12/2018  VLDL 12 06/12/2018   LDLCALC 127 (H) 06/12/2018   LDLCALC 130 (H) 01/14/2015    Physical Findings: AIMS:  , ,  ,  ,    CIWA:  CIWA-Ar Total: 0 COWS:     Musculoskeletal: Strength & Muscle Tone: within normal limits Gait & Station: normal Patient leans: N/A  Psychiatric Specialty Exam: Physical Exam  ROS  Blood pressure (!) 141/95, pulse 93, temperature 98 F (36.7 C), temperature source Oral, resp. rate 20, height 5\' 8"  (1.727 m), weight 108.9 kg, SpO2 99 %.Body mass index is 36.49 kg/m.  General Appearance: Casual  Eye Contact:  Good  Speech:  Clear and Coherent  Volume:  Decreased  Mood:  Anxious and Dysphoric  Affect:  Appropriate and Congruent  Thought Process:  Coherent and Descriptions of Associations: Intact  Orientation:  Full (Time, Place, and Person)  Thought Content:  Tangential  Suicidal Thoughts:  No  Homicidal Thoughts:  No  Memory:  Immediate;   Fair  Judgement:  Fair  Insight:  Fair  Psychomotor Activity:  Normal  Concentration:  Concentration: Fair  Recall:  Fair  Fund of Knowledge:  Good  Language:  Good  Akathisia:  Negative  Handed:  Right  AIMS (if indicated):     Assets:  Leisure Time Physical Health Resilience  ADL's:  Intact  Cognition:  WNL  Sleep:  Number of Hours: 4     Treatment Plan Summary: Daily contact with patient to assess and evaluate symptoms and progress in treatment, Medication management and Plan Continue detox measures continue antidepressant  continue to monitor for safety address hypertension  Klyn Kroening, MD 06/13/2018, 9:11 AM

## 2018-06-13 NOTE — Progress Notes (Signed)
Recreation Therapy Notes  Date:  5.15.20 Time: 0930 Location: 300 Hall Dayroom  Group Topic: Stress Management  Goal Area(s) Addresses:  Patient will identify positive stress management techniques. Patient will identify benefits of using stress management post d/c.  Behavioral Response:  Engaged  Intervention: Stress Management  Activity :  Guided Imagery.  LRT introduced the stress management technique of guided imagery.  LRT read a script that lead patients on a walk along the beach.  Patients were to listen and follow along to engage in activity.  Education:  Stress Management, Discharge Planning.   Education Outcome: Acknowledges Education  Clinical Observations/Feedback: Pt attended and participated in group.    Caroll Rancher, LRT/CTRS     Lillia Abed, [place]adding A 06/13/2018 11:40 AM

## 2018-06-13 NOTE — Plan of Care (Signed)
D: Pt denies SI/HI/ AH, +ve VH. Pt is pleasant and cooperative. Pt stated he was getting better. 1:1 time spent with writer discussing Tx moving forward and dealing with his past , present, and future issues.   A: Pt was offered support and encouragement. Pt was given scheduled medications. Pt was encourage to attend groups. Q 15 minute checks were done for safety.   R:Pt  interacts well with peers and staff. Pt is taking medication. Pt has no complaints.Pt receptive to treatment and safety maintained on unit.  Problem: Education: Goal: Emotional status will improve Outcome: Progressing   Problem: Education: Goal: Mental status will improve Outcome: Progressing

## 2018-06-13 NOTE — [facility] Suicide Risk Assessment (Signed)
[facility] INPATIENT:  Family/Significant Other Suicide Prevention Education  Suicide Prevention Education:  Contact Attempts: Malik Edwards (therpist at [facility]) [phone]  has been identified by the patient as the family member/significant other with whom the patient will be residing, and identified as the person(s) who will aid the patient in the event of a mental health crisis.  With written consent from the patient, two attempts were made to provide suicide prevention education, prior to and/or following the patient's discharge.  We were unsuccessful in providing suicide prevention education.  A suicide education pamphlet was given to the patient to share with family/significant other.  Date and time of first attempt: 06/13/2018 at 2:07pm. Left a voicemail with contact information.   Malik Edwards 06/13/2018, 2:09 PM

## 2018-06-13 NOTE — Plan of Care (Signed)
  Problem: Education: Goal: Knowledge of [facility] Education information/materials will improve Outcome: Progressing Goal: Emotional status will improve Outcome: Progressing   

## 2018-06-13 NOTE — Progress Notes (Signed)
D Pt is observed OOB UAL on the 300 hall. He presents paranoid, agitated and with minimal insight about his detox protocol.      A HE completed hisd aily assessment and on this he wrote he denied SI today and he rated his depression, hopelessness and anxiety " 2/2/9", respectively.     R Safety in place.

## 2018-06-13 NOTE — [facility] Suicide Risk Assessment (Signed)
[facility] INPATIENT:  Family/Significant Other Suicide Prevention Education  Suicide Prevention Education:  Education Completed; Malik Edwards (therpist at [employer]) [phone] has been identified by the patient as the family member/significant other with whom the patient will be residing, and identified as the person(s) who will aid the patient in the event of a mental health crisis (suicidal ideations/suicide attempt).  With written consent from the patient, the family member/significant other has been provided the following suicide prevention education, prior to the and/or following the discharge of the patient.  The suicide prevention education provided includes the following:  Suicide risk factors  Suicide prevention and interventions  National Suicide Hotline telephone number  [facility] assessment telephone number  [facility] Emergency Assistance 911  [facility] and/or Residential Mobile Crisis Unit telephone number  Request made of family/significant other to:  Remove weapons (e.g., guns, rifles, knives), all items previously/currently identified as safety concern.    Remove drugs/medications (over-the-counter, prescriptions, illicit drugs), all items previously/currently identified as a safety concern.  The family member/significant other verbalizes understanding of the suicide prevention education information provided.  The family member/significant other agrees to remove the items of safety concern listed above.  Patient's therapist, Malik Edwards, expressed several concerns for patient in regard to discharge planning.  Malik Edwards reports that patient was hospitalized at [facility] last month and decompensated within 24 hours of discharge. She reports patient has been "chronically suicidal" for the last couple of weeks. She states "I don't think he could do 24 hours alone at home."   She referred him to [facility] in [place] today, as they have a  program which she believes would address some of his trauma history and "dissociative identity disorder." CSW inquired how long it usually takes for intake at [facility], therapist reports it usually takes 2-3 weeks but she has requested expedited processing. Therapist asked several times for patient to be held at [facility] so he can go to [facility] door to door. CSW explained this is not likely, therapist voiced understanding. She will call back CSW with updates for the [facility] referral.  Therapist inquired about patient doing [facility] and outpatient with her if he cannot discharge directly to [facility]. CSW will follow up with patient to inquire if he is interested in [facility]  Malik Edwards 06/13/2018, 2:25 PM

## 2018-06-13 NOTE — Tx Team (Signed)
Interdisciplinary Treatment and Diagnostic Plan Update  06/13/2018 Time of Session: 0930 Malik Edwards MRN: [id number]  Principal Diagnosis: <principal problem not specified>  Secondary Diagnoses: Active Problems:   MDD (major depressive disorder)   Current Medications:  Current Facility-Administered Medications  Medication Dose Route Frequency Provider Last Rate Last Dose  . acetaminophen (TYLENOL) tablet 650 mg  650 mg Oral Q6H PRN Denzil Magnuson, NP      . alum & mag hydroxide-simeth (MAALOX/MYLANTA) 200-200-20 MG/5ML suspension 30 mL  30 mL Oral Q4H PRN Denzil Magnuson, NP      . cariprazine (VRAYLAR) capsule 1.5 mg  1.5 mg Oral Daily Malvin Johns, MD   1.5 mg at 06/13/18 0826  . carvedilol (COREG) tablet 50 mg  50 mg Oral BID WC Malvin Johns, MD   50 mg at 06/13/18 0825  . hydrOXYzine (ATARAX/VISTARIL) tablet 25 mg  25 mg Oral Q6H PRN Denzil Magnuson, NP   25 mg at 06/13/18 0024  . loperamide (IMODIUM) capsule 2-4 mg  2-4 mg Oral PRN Denzil Magnuson, NP   4 mg at 06/12/18 1814  . LORazepam (ATIVAN) tablet 1 mg  1 mg Oral Q6H PRN Denzil Magnuson, NP   1 mg at 06/12/18 0021  . LORazepam (ATIVAN) tablet 1 mg  1 mg Oral TID Denzil Magnuson, NP   1 mg at 06/13/18 7169   Followed by  . [START ON 06/14/2018] LORazepam (ATIVAN) tablet 1 mg  1 mg Oral BID Denzil Magnuson, NP       Followed by  . [START ON 06/15/2018] LORazepam (ATIVAN) tablet 1 mg  1 mg Oral Daily Denzil Magnuson, NP      . multivitamin with minerals tablet 1 tablet  1 tablet Oral Daily Denzil Magnuson, NP   1 tablet at 06/13/18 0826  . nicotine (NICODERM CQ - dosed in mg/24 hours) patch 21 mg  21 mg Transdermal Daily Cobos, Rockey Situ, MD   21 mg at 06/13/18 0849  . nicotine polacrilex (NICORETTE) gum 2 mg  2 mg Oral PRN Cobos, Rockey Situ, MD      . ondansetron (ZOFRAN-ODT) disintegrating tablet 4 mg  4 mg Oral Q6H PRN Denzil Magnuson, NP      . pneumococcal 23 valent vaccine (PNU-IMMUNE) injection 0.5 mL   0.5 mL Intramuscular Tomorrow-1000 Cobos, Fernando A, MD      . prazosin (MINIPRESS) capsule 5 mg  5 mg Oral QHS Malvin Johns, MD      . temazepam (RESTORIL) capsule 30 mg  30 mg Oral QHS Malvin Johns, MD   30 mg at 06/12/18 2125  . thiamine (VITAMIN B-1) tablet 100 mg  100 mg Oral Daily Denzil Magnuson, NP   100 mg at 06/13/18 0825  . traZODone (DESYREL) tablet 100 mg  100 mg Oral QHS PRN,MR X 1 Donell Sievert E, PA-C   100 mg at 06/13/18 0045  . vortioxetine HBr (TRINTELLIX) tablet 10 mg  10 mg Oral Daily Malvin Johns, MD   10 mg at 06/13/18 6789   PTA Medications: Medications Prior to Admission  Medication Sig Dispense Refill Last Dose  . aspirin EC 81 MG EC tablet Take 2 tablets (162 mg total) by mouth daily. For heart health (Patient taking differently: Take 81 mg by mouth daily. For heart health)   Past Month at Unknown time  . carvedilol (COREG) 25 MG tablet Take 0.5 tablets (12.5 mg total) by mouth 2 (two) times daily. (Patient taking differently: Take 25 mg by mouth 2 (two) times  daily. ) 180 tablet 0 Past Month at Unknown time  . Cholecalciferol (VITAMIN D) 50 MCG (2000 UT) tablet Take 2,000 Units by mouth daily.   Past Month at Unknown time  . Multiple Vitamin (MULTIVITAMIN WITH MINERALS) TABS tablet Take 1 tablet by mouth daily.   Past Month at Unknown time  . naltrexone (DEPADE) 50 MG tablet Take 50 mg by mouth daily.     . temazepam (RESTORIL) 15 MG capsule Take 15 mg by mouth daily.     . traZODone (DESYREL) 50 MG tablet Take 50 mg by mouth at bedtime.       Patient Stressors: Financial difficulties Substance abuse  Patient Strengths: Ability for Warden/rangerinsight Communication skills Motivation for treatment/growth  Treatment Modalities: Medication Management, Group therapy, Case management,  1 to 1 session with clinician, Psychoeducation, Recreational therapy.   Physician Treatment Plan for Primary Diagnosis: <principal problem not specified> Long Term Goal(s): Improvement in  symptoms so as ready for discharge Improvement in symptoms so as ready for discharge   Short Term Goals: Ability to identify changes in lifestyle to reduce recurrence of condition will improve Ability to verbalize feelings will improve Ability to disclose and discuss suicidal ideas Ability to demonstrate self-control will improve Ability to identify and develop effective coping behaviors will improve Ability to maintain clinical measurements within normal limits will improve Ability to identify changes in lifestyle to reduce recurrence of condition will improve Ability to verbalize feelings will improve Ability to disclose and discuss suicidal ideas Ability to demonstrate self-control will improve Ability to identify and develop effective coping behaviors will improve Ability to maintain clinical measurements within normal limits will improve Compliance with prescribed medications will improve  Medication Management: Evaluate patient's response, side effects, and tolerance of medication regimen.  Therapeutic Interventions: 1 to 1 sessions, Unit Group sessions and Medication administration.  Evaluation of Outcomes: Progressing  Physician Treatment Plan for Secondary Diagnosis: Active Problems:   MDD (major depressive disorder)  Long Term Goal(s): Improvement in symptoms so as ready for discharge Improvement in symptoms so as ready for discharge   Short Term Goals: Ability to identify changes in lifestyle to reduce recurrence of condition will improve Ability to verbalize feelings will improve Ability to disclose and discuss suicidal ideas Ability to demonstrate self-control will improve Ability to identify and develop effective coping behaviors will improve Ability to maintain clinical measurements within normal limits will improve Ability to identify changes in lifestyle to reduce recurrence of condition will improve Ability to verbalize feelings will improve Ability to disclose  and discuss suicidal ideas Ability to demonstrate self-control will improve Ability to identify and develop effective coping behaviors will improve Ability to maintain clinical measurements within normal limits will improve Compliance with prescribed medications will improve     Medication Management: Evaluate patient's response, side effects, and tolerance of medication regimen.  Therapeutic Interventions: 1 to 1 sessions, Unit Group sessions and Medication administration.  Evaluation of Outcomes: Progressing   RN Treatment Plan for Primary Diagnosis: <principal problem not specified> Long Term Goal(s): Knowledge of disease and therapeutic regimen to maintain health will improve  Short Term Goals: Ability to identify and develop effective coping behaviors will improve and Compliance with prescribed medications will improve  Medication Management: RN will administer medications as ordered by provider, will assess and evaluate patient's response and provide education to patient for prescribed medication. RN will report any adverse and/or side effects to prescribing provider.  Therapeutic Interventions: 1 on 1 counseling sessions, Psychoeducation, Medication administration,  Evaluate responses to treatment, Monitor vital signs and CBGs as ordered, Perform/monitor CIWA, COWS, AIMS and Fall Risk screenings as ordered, Perform wound care treatments as ordered.  Evaluation of Outcomes: Progressing   LCSW Treatment Plan for Primary Diagnosis: <principal problem not specified> Long Term Goal(s): Safe transition to appropriate next level of care at discharge, Engage patient in therapeutic group addressing interpersonal concerns.  Short Term Goals: Engage patient in aftercare planning with referrals and resources, Increase social support and Increase skills for wellness and recovery  Therapeutic Interventions: Assess for all discharge needs, 1 to 1 time with Social worker, Explore available  resources and support systems, Assess for adequacy in community support network, Educate family and significant other(s) on suicide prevention, Complete Psychosocial Assessment, Interpersonal group therapy.  Evaluation of Outcomes: Progressing   Progress in Treatment: Attending groups: Yes. Participating in groups: Yes. Taking medication as prescribed: Yes. Toleration medication: Yes. Family/Significant other contact made: No, will contact:  therapist Patient understands diagnosis: Yes. Discussing patient identified problems/goals with staff: Yes. Medical problems stabilized or resolved: Yes. Denies suicidal/homicidal ideation: Yes. Issues/concerns per patient self-inventory: No. Other: none  New problem(s) identified: No, Describe:  none  New Short Term/Long Term Goal(s):  Patient Goals:  Get into residential treatment  Discharge Plan or Barriers:   Reason for Continuation of Hospitalization: Depression Medication stabilization  Estimated Length of Stay: 3-5 days.  Attendees: Patient:Malik Edwards 06/13/2018  Physician:  06/13/2018   Nursing:  06/13/2018   RN Care Manager: 06/13/2018   Social Worker: Daleen Squibb, LCSW 06/13/2018   Recreational Therapist:  06/13/2018   Other:  06/13/2018   Other:  06/13/2018   Other: 06/13/2018     Scribe for Treatment Team: Lorri Frederick, LCSW 06/13/2018 11:59 AM

## 2018-06-14 DIAGNOSIS — F1020 Alcohol dependence, uncomplicated: Secondary | ICD-10-CM | POA: Diagnosis present

## 2018-06-14 NOTE — Progress Notes (Addendum)
[facility] MD Progress Note  06/14/2018 1:36 PM Malik Edwards  MRN:  [id number]   Subjective: Patient reports today that he is feeling better.  Patient denies any suicidal or homicidal ideations and denies any hallucinations.  Patient reports that he does not feel like he is having any severe withdrawals but does mention having some minor tremors.  Patient states that his sleep was really good last night and his appetite is improving and has been eating all of his meals.  He does continue to report having some depression and anxiety and rates the depression at a 3 out of 10 and anxiety at 7 out of 10 with 10 being the worst.  Patient states that he feels that things are going very well and his plan is to go to residential rehab after discharge.  Objective: Patient's chart and findings reviewed and discussed with treatment team.  Patient presents in his room and is straightening up his belongings.  Patient is pleasant, calm, and cooperative.  Patient has congruent affect and logical conversation.  Patient does tend to have some minor tremors when he extends his arms.  We will continue patient's current medications and continue detox protocol at this time.  Principal Problem: MDD (major depressive disorder), recurrent severe, without psychosis (HCC) Diagnosis: Principal Problem:   MDD (major depressive disorder), recurrent severe, without psychosis (HCC) Active Problems:   Alcohol use disorder, severe, dependence (HCC)  Total Time spent with patient: 20 minutes  Past Psychiatric History: See H&P  Past Medical History:  Past Medical History:  Diagnosis Date  . Alcoholic (HCC)   . Anxiety   . Atrial flutter (HCC) 2013  . Cardiomyopathy (HCC)   . Colitis   . Depression   . High cholesterol   . Hypertension   . OCD (obsessive compulsive disorder)   . OSA (obstructive sleep apnea) 01/22/2013  . PTSD (post-traumatic stress disorder)     Past Surgical History:  Procedure Laterality Date  .  CARDIOVERSION N/A 09/15/2012   Procedure: CARDIOVERSION;  Surgeon: Chrystie Nose, MD;  Location: [facility] OR;  Service: Cardiovascular;  Laterality: N/A;   Family History:  Family History  Problem Relation Age of Onset  . Healthy Mother   . Healthy Father   . Cancer Father        prostate cancer  . Heart attack Maternal Grandmother   . Heart attack Maternal Grandfather   . Heart attack Paternal Grandmother   . Sudden Cardiac Death Neg Hx    Family Psychiatric  History: See H&P Social History:  Social History   Substance and Sexual Activity  Alcohol Use Yes  . Alcohol/week: 12.0 standard drinks  . Types: 12 Standard drinks or equivalent per week   Comment: 1/2 gallon liquor per day. drinks about a fifth about 3 times a week.     Social History   Substance and Sexual Activity  Drug Use No   Comment: denied all drug use    Social History   Socioeconomic History  . Marital status: Married    Spouse name: Dominque Kravchuk  . Number of children: 0  . Years of education: Assoc. Deg  . Highest education level: Not on file  Occupational History  . Occupation: Forensic scientist: OTHER    Comment: Mudlogger  Social Needs  . Financial resource strain: Not on file  . Food insecurity:    Worry: Not on file    Inability: Not on file  . Transportation needs:  Medical: Not on file    Non-medical: Not on file  Tobacco Use  . Smoking status: Current Every Day Smoker    Packs/day: 0.25    Years: 20.00    Pack years: 5.00    Types: Cigarettes  . Smokeless tobacco: Never Used  Substance and Sexual Activity  . Alcohol use: Yes    Alcohol/week: 12.0 standard drinks    Types: 12 Standard drinks or equivalent per week    Comment: 1/2 gallon liquor per day. drinks about a fifth about 3 times a week.  . Drug use: No    Comment: denied all drug use  . Sexual activity: Yes    Partners: Female    Birth control/protection: None    Comment: wife  Lifestyle  . Physical  activity:    Days per week: Not on file    Minutes per session: Not on file  . Stress: Not on file  Relationships  . Social connections:    Talks on phone: Not on file    Gets together: Not on file    Attends religious service: Not on file    Active member of club or organization: Not on file    Attends meetings of clubs or organizations: Not on file    Relationship status: Not on file  Other Topics Concern  . Not on file  Social History Narrative   Patient lives at home with spouse.   Caffeine Use: 1-2 cups daily   Additional Social History:                         Sleep: Improving  Appetite:  Improving  Current Medications: Current Facility-Administered Medications  Medication Dose Route Frequency Provider Last Rate Last Dose  . acetaminophen (TYLENOL) tablet 650 mg  650 mg Oral Q6H PRN Denzil Magnuson, NP      . alum & mag hydroxide-simeth (MAALOX/MYLANTA) 200-200-20 MG/5ML suspension 30 mL  30 mL Oral Q4H PRN Denzil Magnuson, NP      . cariprazine (VRAYLAR) capsule 1.5 mg  1.5 mg Oral Daily Malvin Johns, MD   1.5 mg at 06/14/18 0820  . carvedilol (COREG) tablet 50 mg  50 mg Oral BID WC Malvin Johns, MD   50 mg at 06/14/18 0819  . hydrOXYzine (ATARAX/VISTARIL) tablet 25 mg  25 mg Oral Q6H PRN Denzil Magnuson, NP   25 mg at 06/13/18 0024  . loperamide (IMODIUM) capsule 2-4 mg  2-4 mg Oral PRN Denzil Magnuson, NP   4 mg at 06/12/18 1814  . LORazepam (ATIVAN) tablet 1 mg  1 mg Oral Q6H PRN Denzil Magnuson, NP   1 mg at 06/12/18 0021  . LORazepam (ATIVAN) tablet 1 mg  1 mg Oral BID Denzil Magnuson, NP   1 mg at 06/14/18 0820   Followed by  . [START ON 06/15/2018] LORazepam (ATIVAN) tablet 1 mg  1 mg Oral Daily Denzil Magnuson, NP      . multivitamin with minerals tablet 1 tablet  1 tablet Oral Daily Denzil Magnuson, NP   1 tablet at 06/14/18 0819  . nicotine (NICODERM CQ - dosed in mg/24 hours) patch 21 mg  21 mg Transdermal Daily Money, Gerlene Burdock, FNP   21 mg at  06/14/18 0254  . ondansetron (ZOFRAN-ODT) disintegrating tablet 4 mg  4 mg Oral Q6H PRN Denzil Magnuson, NP      . pneumococcal 23 valent vaccine (PNU-IMMUNE) injection 0.5 mL  0.5 mL Intramuscular Tomorrow-1000 , Madaline Guthrie  A, MD      . prazosin (MINIPRESS) capsule 5 mg  5 mg Oral QHS Malvin JohnsFarah, Brian, MD   5 mg at 06/13/18 2136  . temazepam (RESTORIL) capsule 30 mg  30 mg Oral QHS Malvin JohnsFarah, Brian, MD   30 mg at 06/13/18 2137  . thiamine (VITAMIN B-1) tablet 100 mg  100 mg Oral Daily Denzil Magnusonhomas, Lashunda, NP   100 mg at 06/14/18 0819  . traZODone (DESYREL) tablet 100 mg  100 mg Oral QHS PRN,MR X 1 Kerry HoughSimon, Spencer E, PA-C   100 mg at 06/13/18 2136  . vortioxetine HBr (TRINTELLIX) tablet 10 mg  10 mg Oral Daily Malvin JohnsFarah, Brian, MD   10 mg at 06/14/18 0820    Lab Results: No results found for this or any previous visit (from the past 48 hour(s)).  Blood Alcohol level:  Lab Results  Component Value Date   ETH 387 (HH) 06/10/2018   ETH 281 (H) 05/27/2018    Metabolic Disorder Labs: Lab Results  Component Value Date   HGBA1C 5.1 06/12/2018   MPG 99.67 06/12/2018   MPG 111 01/14/2015   Lab Results  Component Value Date   PROLACTIN 14.8 01/14/2015   Lab Results  Component Value Date   CHOL 256 (H) 06/12/2018   TRIG 60 06/12/2018   HDL 117 06/12/2018   CHOLHDL 2.2 06/12/2018   VLDL 12 06/12/2018   LDLCALC 127 (H) 06/12/2018   LDLCALC 130 (H) 01/14/2015    Physical Findings: AIMS:  , ,  ,  ,    CIWA:  CIWA-Ar Total: 5 COWS:     Musculoskeletal: Strength & Muscle Tone: within normal limits Gait & Station: normal Patient leans: N/A  Psychiatric Specialty Exam: Physical Exam  Nursing note and vitals reviewed. Constitutional: He is oriented to person, place, and time. He appears well-developed and well-nourished.  Cardiovascular: Normal rate.  Respiratory: Effort normal.  Musculoskeletal: Normal range of motion.  Neurological: He is alert and oriented to person, place, and time.   Skin: Skin is warm.    Review of Systems  Constitutional: Negative.   HENT: Negative.   Eyes: Negative.   Respiratory: Negative.   Cardiovascular: Negative.   Gastrointestinal: Negative.   Genitourinary: Negative.   Musculoskeletal: Negative.   Skin: Negative.   Neurological: Negative.   Endo/Heme/Allergies: Negative.   Psychiatric/Behavioral: Positive for depression. Negative for suicidal ideas. The patient is nervous/anxious.     Blood pressure (!) 155/99, pulse 83, temperature 98 F (36.7 C), temperature source Oral, resp. rate 20, height 5\' 8"  (1.727 m), weight 108.9 kg, SpO2 99 %.Body mass index is 36.49 kg/m.  General Appearance: Casual  Eye Contact:  Good  Speech:  Clear and Coherent and Normal Rate  Volume:  Normal  Mood:  Euthymic  Affect:  Congruent  Thought Process:  Coherent and Descriptions of Associations: Intact  Orientation:  Full (Time, Place, and Person)  Thought Content:  WDL  Suicidal Thoughts:  No  Homicidal Thoughts:  No  Memory:  Immediate;   Good Recent;   Good Remote;   Good  Judgement:  Fair  Insight:  Fair  Psychomotor Activity:  Normal  Concentration:  Concentration: Good and Attention Span: Good  Recall:  Good  Fund of Knowledge:  Good  Language:  Good  Akathisia:  No  Handed:  Right  AIMS (if indicated):     Assets:  Communication Skills Desire for Improvement Financial Resources/Insurance Housing Physical Health Social Support Transportation  ADL's:  Intact  Cognition:  WNL  Sleep:  Number of Hours: 6.25   Problems addressed MDD severe recurrent Alcohol use disorder severe  Treatment Plan Summary: Daily contact with patient to assess and evaluate symptoms and progress in treatment, Medication management and Plan is to: Continue Vraylar 1.5 mg p.o. daily for MDD Continue Vistaril 25 mg p.o. every 6 hours as needed for anxiety Continue Ativan detox protocol Continue prazosin 5 mg p.o. nightly for nightmares Continue  Restoril 30 mg p.o. nightly for insomnia Continue Trintellix 10 mg p.o. daily for MDD Encourage group therapy participation CSW to assist with disposition planning Continue every 15 minute safety checks  Maryfrances Bunnell, FNP 06/14/2018, 1:36 PM   Attest to NP progress note

## 2018-06-14 NOTE — Progress Notes (Signed)
[place] NOVEL CORONAVIRUS (COVID-19) DAILY CHECK-OFF SYMPTOMS - answer yes or no to each - every day NO YES  Have you had a fever in the past 24 hours?  . Fever (Temp > 37.80C / 100F) X   Have you had any of these symptoms in the past 24 hours? . New Cough .  Sore Throat  .  Shortness of Breath .  Difficulty Breathing .  Unexplained Body Aches   X   Have you had any one of these symptoms in the past 24 hours not related to allergies?   . Runny Nose .  Nasal Congestion .  Sneezing   X   If you have had runny nose, nasal congestion, sneezing in the past 24 hours, has it worsened?  X   EXPOSURES - check yes or no X   Have you traveled outside the state in the past 14 days?  X   Have you been in contact with someone with a confirmed diagnosis of COVID-19 or PUI in the past 14 days without wearing appropriate PPE?  X   Have you been living in the same home as a person with confirmed diagnosis of COVID-19 or a PUI (household contact)?    X   Have you been diagnosed with COVID-19?    X              What to do next: Answered NO to all: Answered YES to anything:   Proceed with unit schedule Follow the [facility] Inpatient Flowsheet.   

## 2018-06-14 NOTE — [facility] Group Notes (Signed)
[facility] Group Notes:  (Nursing)  Date:  06/14/2018  Time: 130 PM Type of Therapy:  Nurse Education  Participation Level:  Active  Participation Quality:  Appropriate and Attentive  Affect:  Appropriate  Cognitive:  Appropriate  Insight:  Appropriate  Engagement in Group:  Engaged  Modes of Intervention:  Education  Summary of Progress/Problems: Nurse led Life Skills Group  Malik Edwards 06/14/2018, 1:50 PM

## 2018-06-14 NOTE — Progress Notes (Signed)
[facility] Group Notes:  (Nursing/MHT/Case Management/Adjunct)  Date:  06/14/2018  Time: 2030  Type of Therapy:  wrap up group  Participation Level:  Active  Participation Quality:  Appropriate, Attentive, Sharing and Supportive  Affect:  Appropriate  Cognitive:  Appropriate  Insight:  Improving  Engagement in Group:  Engaged  Modes of Intervention:  Clarification, Education and Support  Summary of Progress/Problems: Pt enjoyed going outside sitting in the sunshine today. Pt reports he is going to reach out to people rather than bottle stuff up. Pt shares that he is grateful for his sister's love.   Marcille Buffy 06/14/2018, 9:13 PM
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
## 2018-06-14 NOTE — Progress Notes (Signed)
D. Pt presents with an anxious affect/ mood- calm, cooperative behavior- friendly upon approach. Pt observed interacting well with peers in the dayroom this am. Per pt's self inventory, pt rates his depression, hopelessness and anxiety a 7/7/9, respectively. Pt currently denies SI/HI - endorses tremors, chilling and  visual disturbances relating to alcohol withdrawal  A. Labs and vitals monitored. Pt compliant with medications. Pt supported emotionally and encouraged to express concerns and ask questions.   R. Pt remains safe with 15 minute checks. Will continue POC.

## 2018-06-14 NOTE — Progress Notes (Signed)
D.  Pt pleasant on approach, no complaints voiced at this time.  Pt was positive for evening wrap up group, observed engaged in appropriate interaction with peers on the unit.  Pt denies SI/HI/AVH at this time.  A.  Support and encouragement offered, medication given as ordered  R.  Pt remains safe on the unit, will continue to monitor.   

## 2018-06-15 MED ORDER — CARIPRAZINE HCL 1.5 MG PO CAPS
1.5000 mg | ORAL_CAPSULE | Freq: Every day | ORAL | Status: AC
  Administered 2018-06-15: 09:00:00 1.5 mg via ORAL
  Filled 2018-06-15: qty 1

## 2018-06-15 MED ORDER — CARIPRAZINE HCL 3 MG PO CAPS
3.0000 mg | ORAL_CAPSULE | Freq: Every day | ORAL | Status: DC
  Administered 2018-06-16: 3 mg via ORAL
  Filled 2018-06-15 (×3): qty 1

## 2018-06-15 MED ORDER — HYDROXYZINE HCL 25 MG PO TABS
25.0000 mg | ORAL_TABLET | Freq: Four times a day (QID) | ORAL | Status: DC | PRN
  Administered 2018-06-15: 25 mg via ORAL
  Filled 2018-06-15: qty 1

## 2018-06-15 MED ORDER — CARIPRAZINE HCL 1.5 MG PO CAPS
1.5000 mg | ORAL_CAPSULE | Freq: Every day | ORAL | Status: AC
  Administered 2018-06-15: 1.5 mg via ORAL
  Filled 2018-06-15: qty 1

## 2018-06-15 NOTE — Progress Notes (Signed)
D. Pt reported that he slept poorly last night and complained earlier this am that he "was not in a good head space". Per pt's self inventory, pt rated his depression, hopelessness and anxiety a 9/9/7, respectively. Pt wrote that his goal today was "cheer up. Talk to Dr .Michael Litter have I suddenly regressed?" Pt visible in the milieu interacting appropriately with peers and staff. Pt currently denies SI/HI and AVH and agrees to contact staff before acting on any harmful thoughts.  A. Labs and vitals monitored. Pt complaint with medications. Pt supported emotionally and encouraged to express concerns and ask questions.   R. Pt remains safe with 15 minute checks. Will continue POC.

## 2018-06-15 NOTE — Progress Notes (Signed)
Pt said his day was a 2. The one positive thing that happen today he got to go outside today.

## 2018-06-15 NOTE — Progress Notes (Signed)
[place] NOVEL CORONAVIRUS (COVID-19) DAILY CHECK-OFF SYMPTOMS - answer yes or no to each - every day NO YES  Have you had a fever in the past 24 hours?  . Fever (Temp > 37.80C / 100F) X   Have you had any of these symptoms in the past 24 hours? . New Cough .  Sore Throat  .  Shortness of Breath .  Difficulty Breathing .  Unexplained Body Aches   X   Have you had any one of these symptoms in the past 24 hours not related to allergies?   . Runny Nose .  Nasal Congestion .  Sneezing   X   If you have had runny nose, nasal congestion, sneezing in the past 24 hours, has it worsened?  X   EXPOSURES - check yes or no X   Have you traveled outside the state in the past 14 days?  X   Have you been in contact with someone with a confirmed diagnosis of COVID-19 or PUI in the past 14 days without wearing appropriate PPE?  X   Have you been living in the same home as a person with confirmed diagnosis of COVID-19 or a PUI (household contact)?    X   Have you been diagnosed with COVID-19?    X              What to do next: Answered NO to all: Answered YES to anything:   Proceed with unit schedule Follow the [facility] Inpatient Flowsheet.   

## 2018-06-15 NOTE — [facility] Group Notes (Signed)
[facility] LCSW Group Therapy Note  06/15/2018  10:00-11:00AM  Type of Therapy and Topic:  Group Therapy:  Adding Supports Including Yourself  Participation Level:  Active   Description of Group:  Patients in this group were introduced to the concept that additional supports including self-support are an essential part of recovery.  Patients listed what supports they believe they need to add to their lives to achieve their goals at discharge, and they listed such things as therapist, family, doctor, support groups, and service dog.  CSW described the  continuum of mental health/substance abuse services available and the group discussed the differences among these including support group, therapy group, 12-step group, Doctor, hospital, Secondary school teacher, and such.  A song entitled "My Own Hero" was played and a group discussion ensued in which patients stated they could relate to the song and it inspired them to realize they have be willing to help themselves in order to succeed, because other people cannot achieve sobriety or stability for them.  A song was played called "I Am Enough" which led to a discussion about being willing to believe we are worth the effort of being a self-support.  Group members expressed appreciation for each other.  Therapeutic Goals: 1)  demonstrate the importance of being a key part of one's own support system 2)  discuss various available supports 3)  encourage patient to use music as part of their self-support and focus on goals 4)  elicit ideas from patients about supports that need to be added   Summary of Patient Progress:  The patient expressed a support that he would like to add is counseling and friends.  He interacted throughout the group, joked with others, and several other patients made remarks about him smiling today.   Therapeutic Modalities:   Motivational Interviewing Activity  Lynnell Chad

## 2018-06-15 NOTE — Progress Notes (Signed)
D.  Pt pleasant but anxious on approach, requested medication for anxiety.  Pt was positive for evening wrap up group, observed engaged in appropriate interaction with peers on the unit.  Pt denies SI/HI/AVH at this time.  A.  Support and encouragement offered, spoke with PA and new orders received.  R.  Pt remains safe on the unit, will continue to monitor.

## 2018-06-15 NOTE — Progress Notes (Addendum)
[facility] MD Progress Note  06/15/2018 10:47 AM Malik Edwards  MRN:  [id number]   Subjective: Patient reports today that he is not doing too well.  He states that he has been having thoughts of self-harm and then goes immediately to being happy.  Patient reports that he feels like he is he is having some significant mood swings today.  Patient does state that he does not want to hurt himself and he is not going to hurt himself and he will notify 1 of the staff if he starts having bad thoughts again.  Patient reports a history of twice daily and PTSD and that he is supposed to be going to a program at [facility] that is specialized for twice daily.  Patient states that he also had some really bad dreams last night related to his PTSD and this made him wake up and he was feeling more depressed.  Patient denies any suicidal or homicidal ideations.  He reports that he does have voices but those are the other personalities that he has.  Objective: Patient's chart and findings reviewed and discussed with treatment team.  Patient presents in the day room and appears to be dazed.  Patient is staring at the floor and his name is stated 2-3 times before he responded.  However, after that the patient responded appropriately and answers questions appropriately.  Reviewed patient's medications and discussed with patient and will increase the Vraylar to 3 mg a day.  Patient has been seen since then walking in the hallway and interacting with peers and staff appropriately and has been seen laughing and talking with other people.  Principal Problem: MDD (major depressive disorder), recurrent severe, without psychosis (HCC) Diagnosis: Principal Problem:   MDD (major depressive disorder), recurrent severe, without psychosis (HCC) Active Problems:   Alcohol use disorder, severe, dependence (HCC)  Total Time spent with patient: 30 minutes  Past Psychiatric History: See H&P  Past Medical History:  Past Medical  History:  Diagnosis Date  . Alcoholic (HCC)   . Anxiety   . Atrial flutter (HCC) 2013  . Cardiomyopathy (HCC)   . Colitis   . Depression   . High cholesterol   . Hypertension   . OCD (obsessive compulsive disorder)   . OSA (obstructive sleep apnea) 01/22/2013  . PTSD (post-traumatic stress disorder)     Past Surgical History:  Procedure Laterality Date  . CARDIOVERSION N/A 09/15/2012   Procedure: CARDIOVERSION;  Surgeon: Chrystie Nose, MD;  Location: [facility] OR;  Service: Cardiovascular;  Laterality: N/A;   Family History:  Family History  Problem Relation Age of Onset  . Healthy Mother   . Healthy Father   . Cancer Father        prostate cancer  . Heart attack Maternal Grandmother   . Heart attack Maternal Grandfather   . Heart attack Paternal Grandmother   . Sudden Cardiac Death Neg Hx    Family Psychiatric  History: See H&P Social History:  Social History   Substance and Sexual Activity  Alcohol Use Yes  . Alcohol/week: 12.0 standard drinks  . Types: 12 Standard drinks or equivalent per week   Comment: 1/2 gallon liquor per day. drinks about a fifth about 3 times a week.     Social History   Substance and Sexual Activity  Drug Use No   Comment: denied all drug use    Social History   Socioeconomic History  . Marital status: Married    Spouse name: Katrina  Montez Morita  . Number of children: 0  . Years of education: Assoc. Deg  . Highest education level: Not on file  Occupational History  . Occupation: Forensic scientist: OTHER    Comment: Mudlogger  Social Needs  . Financial resource strain: Not on file  . Food insecurity:    Worry: Not on file    Inability: Not on file  . Transportation needs:    Medical: Not on file    Non-medical: Not on file  Tobacco Use  . Smoking status: Current Every Day Smoker    Packs/day: 0.25    Years: 20.00    Pack years: 5.00    Types: Cigarettes  . Smokeless tobacco: Never Used  Substance and Sexual Activity   . Alcohol use: Yes    Alcohol/week: 12.0 standard drinks    Types: 12 Standard drinks or equivalent per week    Comment: 1/2 gallon liquor per day. drinks about a fifth about 3 times a week.  . Drug use: No    Comment: denied all drug use  . Sexual activity: Yes    Partners: Female    Birth control/protection: None    Comment: wife  Lifestyle  . Physical activity:    Days per week: Not on file    Minutes per session: Not on file  . Stress: Not on file  Relationships  . Social connections:    Talks on phone: Not on file    Gets together: Not on file    Attends religious service: Not on file    Active member of club or organization: Not on file    Attends meetings of clubs or organizations: Not on file    Relationship status: Not on file  Other Topics Concern  . Not on file  Social History Narrative   Patient lives at home with spouse.   Caffeine Use: 1-2 cups daily   Additional Social History:                         Sleep: Fair  Appetite:  Good  Current Medications: Current Facility-Administered Medications  Medication Dose Route Frequency Provider Last Rate Last Dose  . acetaminophen (TYLENOL) tablet 650 mg  650 mg Oral Q6H PRN Denzil Magnuson, NP      . alum & mag hydroxide-simeth (MAALOX/MYLANTA) 200-200-20 MG/5ML suspension 30 mL  30 mL Oral Q4H PRN Denzil Magnuson, NP      . Melene Muller ON 06/16/2018] cariprazine (VRAYLAR) capsule 3 mg  3 mg Oral Daily Money, Travis B, FNP      . carvedilol (COREG) tablet 50 mg  50 mg Oral BID WC Malvin Johns, MD   50 mg at 06/15/18 0845  . multivitamin with minerals tablet 1 tablet  1 tablet Oral Daily Denzil Magnuson, NP   1 tablet at 06/15/18 0845  . nicotine (NICODERM CQ - dosed in mg/24 hours) patch 21 mg  21 mg Transdermal Daily Money, Feliz Beam B, FNP   21 mg at 06/15/18 0901  . pneumococcal 23 valent vaccine (PNU-IMMUNE) injection 0.5 mL  0.5 mL Intramuscular Tomorrow-1000 Emberley Kral A, MD      . prazosin  (MINIPRESS) capsule 5 mg  5 mg Oral QHS Malvin Johns, MD   5 mg at 06/14/18 2123  . temazepam (RESTORIL) capsule 30 mg  30 mg Oral QHS Malvin Johns, MD   30 mg at 06/14/18 2223  . thiamine (VITAMIN B-1) tablet 100 mg  100 mg Oral Daily Denzil Magnuson, NP   100 mg at 06/15/18 0845  . traZODone (DESYREL) tablet 100 mg  100 mg Oral QHS PRN,MR X 1 Donell Sievert E, PA-C   100 mg at 06/14/18 2223  . vortioxetine HBr (TRINTELLIX) tablet 10 mg  10 mg Oral Daily Malvin Johns, MD   10 mg at 06/15/18 2536    Lab Results: No results found for this or any previous visit (from the past 48 hour(s)).  Blood Alcohol level:  Lab Results  Component Value Date   ETH 387 (HH) 06/10/2018   ETH 281 (H) 05/27/2018    Metabolic Disorder Labs: Lab Results  Component Value Date   HGBA1C 5.1 06/12/2018   MPG 99.67 06/12/2018   MPG 111 01/14/2015   Lab Results  Component Value Date   PROLACTIN 14.8 01/14/2015   Lab Results  Component Value Date   CHOL 256 (H) 06/12/2018   TRIG 60 06/12/2018   HDL 117 06/12/2018   CHOLHDL 2.2 06/12/2018   VLDL 12 06/12/2018   LDLCALC 127 (H) 06/12/2018   LDLCALC 130 (H) 01/14/2015    Physical Findings: AIMS: Facial and Oral Movements Muscles of Facial Expression: None, normal Lips and Perioral Area: None, normal Jaw: None, normal Tongue: None, normal,Extremity Movements Upper (arms, wrists, hands, fingers): None, normal Lower (legs, knees, ankles, toes): None, normal, Trunk Movements Neck, shoulders, hips: None, normal, Overall Severity Severity of abnormal movements (highest score from questions above): None, normal Incapacitation due to abnormal movements: None, normal Patient's awareness of abnormal movements (rate only patient's report): No Awareness, Dental Status Current problems with teeth and/or dentures?: No Does patient usually wear dentures?: No  CIWA:  CIWA-Ar Total: 5 COWS:     Musculoskeletal: Strength & Muscle Tone: within normal  limits Gait & Station: normal Patient leans: N/A  Psychiatric Specialty Exam: Physical Exam  Nursing note and vitals reviewed. Constitutional: He is oriented to person, place, and time. He appears well-developed and well-nourished.  Cardiovascular: Normal rate.  Respiratory: Effort normal.  Musculoskeletal: Normal range of motion.  Neurological: He is alert and oriented to person, place, and time.  Skin: Skin is warm.    Review of Systems  Constitutional: Negative.   HENT: Negative.   Eyes: Negative.   Respiratory: Negative.   Cardiovascular: Negative.   Gastrointestinal: Negative.   Genitourinary: Negative.   Musculoskeletal: Negative.   Skin: Negative.   Neurological: Negative.   Endo/Heme/Allergies: Negative.   Psychiatric/Behavioral: Positive for depression and hallucinations. The patient is nervous/anxious.     Blood pressure (!) 148/100, pulse 93, temperature 98.1 F (36.7 C), temperature source Oral, resp. rate 20, height 5\' 8"  (1.727 m), weight 108.9 kg, SpO2 99 %.Body mass index is 36.49 kg/m.  General Appearance: Casual  Eye Contact:  Good  Speech:  Clear and Coherent and Normal Rate  Volume:  Normal  Mood:  Depressed  Affect:  Flat  Thought Process:  Coherent and Descriptions of Associations: Intact  Orientation:  Full (Time, Place, and Person)  Thought Content:  WDL and Hallucinations: Auditory  Suicidal Thoughts:  No  Homicidal Thoughts:  No  Memory:  Immediate;   Good Recent;   Good Remote;   Good  Judgement:  Fair  Insight:  Fair  Psychomotor Activity:  Normal  Concentration:  Concentration: Good and Attention Span: Good  Recall:  Good  Fund of Knowledge:  Good  Language:  Good  Akathisia:  No  Handed:  Right  AIMS (if indicated):  Assets:  Communication Skills Desire for Improvement Financial Resources/Insurance Housing Physical Health Social Support Transportation  ADL's:  Intact  Cognition:  WNL  Sleep:  Number of Hours: 4.5    Problems addressed MDD severe recurrent Alcohol use disorder severe dependence PTSD  Treatment Plan Summary: Daily contact with patient to assess and evaluate symptoms and progress in treatment, Medication management and Plan is to: Increase Vraylar 3 mg p.o. daily for mood stability Continue Ativan detox protocol Continue prazosin 5 mg p.o. nightly for nightmares Continue Restoril 30 mg p.o. nightly for insomnia Continue trazodone 100 mg p.o. nightly as needed for insomnia Continue Trintellix 10 mg p.o. daily for depression Encourage group therapy participation CSW to assist with disposition Continue every 15 minute safety checks  Maryfrances Bunnell, FNP 06/15/2018, 10:47 AM   Attest to NP progress note

## 2018-06-16 MED ORDER — PRAZOSIN HCL 5 MG PO CAPS: 5.0000 mg | ORAL_CAPSULE | Freq: Every day | ORAL | 2 refills | Status: DC

## 2018-06-16 MED ORDER — CARVEDILOL 25 MG PO TABS: 50.0000 mg | ORAL_TABLET | Freq: Two times a day (BID) | ORAL | 2 refills | Status: DC

## 2018-06-16 MED ORDER — CARIPRAZINE HCL 3 MG PO CAPS: 3.0000 mg | ORAL_CAPSULE | Freq: Every day | ORAL | 2 refills | Status: DC

## 2018-06-16 MED ORDER — TEMAZEPAM 30 MG PO CAPS: 30.0000 mg | ORAL_CAPSULE | Freq: Every day | ORAL | 1 refills | Status: DC

## 2018-06-16 MED ORDER — VORTIOXETINE HBR 10 MG PO TABS: 10.0000 mg | ORAL_TABLET | Freq: Every day | ORAL | 1 refills | Status: DC

## 2018-06-16 NOTE — [facility] Suicide Risk Assessment (Signed)
[facility] Discharge Suicide Risk Assessment   Principal Problem: MDD (major depressive disorder), recurrent severe, without psychosis (HCC) Discharge Diagnoses: Principal Problem:   MDD (major depressive disorder), recurrent severe, without psychosis (HCC) Active Problems:   Alcohol use disorder, severe, dependence (HCC)   Total Time spent with patient: 45 minutes  Musculoskeletal: Strength & Muscle Tone: within normal limits Gait & Station: normal Patient leans: N/A  Psychiatric Specialty Exam: ROS  Blood pressure (!) 132/94, pulse 97, temperature 97.9 F (36.6 C), temperature source Oral, resp. rate 20, height 5\' 8"  (1.727 m), weight 108.9 kg, SpO2 97 %.Body mass index is 36.49 kg/m.  General Appearance: Casual  Eye Contact::  Good  Speech:  Clear and Coherent409  Volume:  Normal  Mood:  Euthymic  Affect:  Constricted  Thought Process:  Coherent  Orientation:  Full (Time, Place, and Person)  Thought Content:  Logical and Tangential  Suicidal Thoughts:  No  Homicidal Thoughts:  No  Memory:  Immediate;   Fair  Judgement:  Fair  Insight:  Good  Psychomotor Activity:  Normal  Concentration:  Good  Recall:  Good  Fund of Knowledge:Good  Language: Good  Akathisia:  Negative  Handed:  Right  AIMS (if indicated):     Assets:  Communication Skills Housing Physical Health  Sleep:  Number of Hours: 5.5  Cognition: WNL  ADL's:  Intact   Mental Status Per Nursing Assessment::   On Admission:  NA  Demographic Factors:  Male, Caucasian and Living alone  Loss Factors: NA  Historical Factors: NA  Risk Reduction Factors:   Sense of responsibility to family and Religious beliefs about death  Continued Clinical Symptoms:  Alcohol/Substance Abuse/Dependencies  Cognitive Features That Contribute To Risk:  None    Suicide Risk:  Minimal: No identifiable suicidal ideation.  Patients presenting with no risk factors but with morbid ruminations; may be classified as minimal  risk based on the severity of the depressive symptoms  Follow-up Information    [facility] Follow up on 06/18/2018.   Specialty:  Behavioral Health Why:  Therapy appointment with Lupita Raider is Wednesday, 5/20 at 11:00a.  Contact information: [address] [place] [address] [phone]        [facility] Follow up.   Specialty:  [facility] information: [address] [place] [address] [phone]         Patient's detox is done he denies suicidal thoughts plans or intent states he "does not want to go back to that house" and cannot give anything more specific but states he is not homeless at any rate no longer meeting criteria for inpatient care denies suicidal thoughts no detox measures needed  Plan Of Care/Follow-up recommendations:  Activity:  full  Kasai Beltran, MD 06/16/2018, 9:18 AM

## 2018-06-16 NOTE — Progress Notes (Signed)
Pt attended spiritual care group on loss and grief facilitated by Chaplain Tynika Luddy, MDiv.   Group goal: Support / education around grief.  Identifying grief patterns, feelings / responses to grief, identifying behaviors that may emerge from grief responses, identifying when one may call on an ally or coping skill.  Group Description:  Following introductions and group rules, group opened with psycho-social ed. Group members engaged in facilitated dialog around topic of loss, with particular support around experiences of loss in their lives. Group Identified types of loss (relationships / self / things) and identified patterns, circumstances, and changes that precipitate losses. Reflected on thoughts / feelings around loss, normalized grief responses, and recognized variety in grief experience.   Group engaged in visual explorer activity, identifying elements of grief journey as well as needs / ways of caring for themselves.  Group reflected on Worden's tasks of grief.  Group facilitation drew on brief cognitive behavioral, narrative, and Adlerian modalities   Patient progress: 

## 2018-06-16 NOTE — Progress Notes (Signed)
Recreation Therapy Notes  Date:  1.22.20 Time: 0930 Location: [address] Dayroom  Group Topic: Stress Management  Goal Area(s) Addresses:  Patient will identify positive stress management techniques. Patient will identify benefits of using stress management post d/c.  Behavioral Response:  Engaged  Intervention: Stress Management  Activity :  Meditation.  LRT introduced the stress management technique of meditation.  LRT played a meditation that focused on making the most of your day.  Patients were to listen and follow as meditation played to engage in activity.  Education:  Stress Management, Discharge Planning.   Education Outcome: Acknowledges Education  Clinical Observations/Feedback: Pt attended and participated in group session.     Caroll Rancher, LRT/CTRS     Lillia Abed, Latiqua Daloia A 06/16/2018 11:40 AM

## 2018-06-16 NOTE — Progress Notes (Signed)
Discharge note: Patient reviewed discharge paperwork with RN including prescriptions, follow up appointments, and lab work. Patient given the opportunity to ask questions. All concerns were addressed. All belongings were returned to patient. Denied SI/HI/AVH. Patient thanked staff for their care while at the hospital.  Patient was discharged to lobby where someone was picking him up. Patient claims that we lost his wallet, but it was not written on the initial inventory sheet. Patient eventually accepted this and said "well, maybe they lost it at [facility]." Patient left peacefully.

## 2018-06-16 NOTE — Progress Notes (Signed)
  [facility] Adult Case Management Discharge Plan :  Will you be returning to the same living situation after discharge:  No.; pt is going to go stay with a friend At discharge, do you have transportation home?: Yes,  a friend Do you have the ability to pay for your medications: Yes,  private insurance  Release of information consent forms completed and in the chart;  Patient's signature needed at discharge.  Patient to Follow up at: Follow-up Information    [facility] Follow up on 06/18/2018.   Specialty:  Behavioral Health Why:  Therapy appointment with Lupita Raider is Wednesday, 5/20 at 11:00a.  Contact information: [address] [place] [address] [phone]        [facility] Follow up on 06/20/2018.   Specialty:  Behavioral Health Why:  Please attend your medication management appointment with Acquanetta Sit on Friday, 5/22. Please bring your current medications and discharge paperwork from this hospitalization.  Contact information: [address] [place] [address] [phone]           Next level of care provider has access to [facility] Link:no  Safety Planning and Suicide Prevention discussed: Yes,  with his therapist     Has patient been referred to the Quitline?: Patient refused referral  Patient has been referred for addiction treatment: Yes  Delphia Grates, LCSW 06/16/2018, 10:42 AM

## 2018-06-16 NOTE — Plan of Care (Signed)
  Problem: Education: Goal: Knowledge of [facility] Education information/materials will improve Outcome: Adequate for Discharge Goal: Emotional status will improve Outcome: Adequate for Discharge Goal: Mental status will improve Outcome: Adequate for Discharge Goal: Verbalization of understanding the information provided will improve Outcome: Adequate for Discharge   Problem: Activity: Goal: Interest or engagement in activities will improve Outcome: Adequate for Discharge Goal: Sleeping patterns will improve Outcome: Adequate for Discharge   Problem: Coping: Goal: Ability to verbalize frustrations and anger appropriately will improve Outcome: Adequate for Discharge Goal: Ability to demonstrate self-control will improve Outcome: Adequate for Discharge   Problem: Health Behavior/Discharge Planning: Goal: Identification of resources available to assist in meeting health care needs will improve Outcome: Adequate for Discharge Goal: Compliance with treatment plan for underlying cause of condition will improve Outcome: Adequate for Discharge   Problem: Physical Regulation: Goal: Ability to maintain clinical measurements within normal limits will improve Outcome: Adequate for Discharge   Problem: Safety: Goal: Periods of time without injury will increase Outcome: Adequate for Discharge   Problem: Education: Goal: Knowledge of disease or condition will improve Outcome: Adequate for Discharge Goal: Understanding of discharge needs will improve Outcome: Adequate for Discharge   Problem: Health Behavior/Discharge Planning: Goal: Ability to identify changes in lifestyle to reduce recurrence of condition will improve Outcome: Adequate for Discharge Goal: Identification of resources available to assist in meeting health care needs will improve Outcome: Adequate for Discharge   Problem: Physical Regulation: Goal: Complications related to the disease process, condition or  treatment will be avoided or minimized Outcome: Adequate for Discharge   Problem: Safety: Goal: Ability to remain free from injury will improve Outcome: Adequate for Discharge   Problem: Education: Goal: Utilization of techniques to improve thought processes will improve Outcome: Adequate for Discharge Goal: Knowledge of the prescribed therapeutic regimen will improve Outcome: Adequate for Discharge   Problem: Activity: Goal: Interest or engagement in leisure activities will improve Outcome: Adequate for Discharge Goal: Imbalance in normal sleep/wake cycle will improve Outcome: Adequate for Discharge   Problem: Coping: Goal: Coping ability will improve Outcome: Adequate for Discharge Goal: Will verbalize feelings Outcome: Adequate for Discharge   Problem: Health Behavior/Discharge Planning: Goal: Ability to make decisions will improve Outcome: Adequate for Discharge Goal: Compliance with therapeutic regimen will improve Outcome: Adequate for Discharge   Problem: Role Relationship: Goal: Will demonstrate positive changes in social behaviors and relationships Outcome: Adequate for Discharge   Problem: Safety: Goal: Ability to disclose and discuss suicidal ideas will improve Outcome: Adequate for Discharge Goal: Ability to identify and utilize support systems that promote safety will improve Outcome: Adequate for Discharge   Problem: Self-Concept: Goal: Will verbalize positive feelings about self Outcome: Adequate for Discharge Goal: Level of anxiety will decrease Outcome: Adequate for Discharge   Problem: Education: Goal: Ability to make informed decisions regarding treatment will improve Outcome: Adequate for Discharge   Problem: Coping: Goal: Coping ability will improve Outcome: Adequate for Discharge   Problem: Health Behavior/Discharge Planning: Goal: Identification of resources available to assist in meeting health care needs will improve Outcome: Adequate  for Discharge   Problem: Medication: Goal: Compliance with prescribed medication regimen will improve Outcome: Adequate for Discharge   Problem: Self-Concept: Goal: Ability to disclose and discuss suicidal ideas will improve Outcome: Adequate for Discharge Goal: Will verbalize positive feelings about self Outcome: Adequate for Discharge
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
## 2018-06-19 DIAGNOSIS — F4310 Post-traumatic stress disorder, unspecified: Secondary | ICD-10-CM | POA: Diagnosis not present

## 2018-06-20 DIAGNOSIS — K769 Liver disease, unspecified: Secondary | ICD-10-CM | POA: Diagnosis not present

## 2018-06-20 DIAGNOSIS — F4310 Post-traumatic stress disorder, unspecified: Secondary | ICD-10-CM | POA: Diagnosis not present

## 2018-06-20 DIAGNOSIS — R748 Abnormal levels of other serum enzymes: Secondary | ICD-10-CM | POA: Diagnosis not present

## 2018-06-20 DIAGNOSIS — F4481 Dissociative identity disorder: Secondary | ICD-10-CM | POA: Diagnosis not present

## 2018-06-26 DIAGNOSIS — F4310 Post-traumatic stress disorder, unspecified: Secondary | ICD-10-CM | POA: Diagnosis not present

## 2018-06-26 DIAGNOSIS — F4481 Dissociative identity disorder: Secondary | ICD-10-CM | POA: Diagnosis not present

## 2018-06-27 DIAGNOSIS — F4310 Post-traumatic stress disorder, unspecified: Secondary | ICD-10-CM | POA: Diagnosis not present

## 2018-06-30 ENCOUNTER — Telehealth: Payer: Self-pay | Admitting: Internal Medicine

## 2018-06-30 NOTE — Telephone Encounter (Signed)
Yes, I will see him I think he needs to get in this week so please accommodate him  TJ

## 2018-06-30 NOTE — Telephone Encounter (Signed)
Appointment scheduled.

## 2018-06-30 NOTE — Telephone Encounter (Signed)
Dr. Yetta Barre, See message below.  Okay to schedule patient to be seen by you?

## 2018-06-30 NOTE — Telephone Encounter (Signed)
Copied from CRM [id number]. Topic: Appointment Scheduling - Scheduling Inquiry for Clinic >> Jun 30, 2018  8:10 AM Maia Petties wrote: Reason for CRM: Pt called stating that his [facility] provider Dr. Charlsie Merles called Dr. Yetta Barre last week and he was agreeable to work pt in this week with him. Please call pt to advise. When calling office it kept going to VM.

## 2018-07-01 ENCOUNTER — Encounter: Payer: Self-pay | Admitting: Internal Medicine

## 2018-07-01 ENCOUNTER — Other Ambulatory Visit (INDEPENDENT_AMBULATORY_CARE_PROVIDER_SITE_OTHER): Payer: BC Managed Care – PPO

## 2018-07-01 ENCOUNTER — Ambulatory Visit (INDEPENDENT_AMBULATORY_CARE_PROVIDER_SITE_OTHER): Payer: BC Managed Care – PPO | Admitting: Internal Medicine

## 2018-07-01 ENCOUNTER — Other Ambulatory Visit: Payer: Self-pay

## 2018-07-01 VITALS — BP 174/110 | HR 69 | Temp 98.0°F | Resp 16 | Ht 68.0 in | Wt 259.0 lb

## 2018-07-01 DIAGNOSIS — F4481 Dissociative identity disorder: Secondary | ICD-10-CM | POA: Diagnosis not present

## 2018-07-01 DIAGNOSIS — F1020 Alcohol dependence, uncomplicated: Secondary | ICD-10-CM

## 2018-07-01 DIAGNOSIS — I10 Essential (primary) hypertension: Secondary | ICD-10-CM

## 2018-07-01 DIAGNOSIS — I426 Alcoholic cardiomyopathy: Secondary | ICD-10-CM

## 2018-07-01 DIAGNOSIS — Z0000 Encounter for general adult medical examination without abnormal findings: Secondary | ICD-10-CM

## 2018-07-01 DIAGNOSIS — F4310 Post-traumatic stress disorder, unspecified: Secondary | ICD-10-CM | POA: Diagnosis not present

## 2018-07-01 LAB — HEPATIC FUNCTION PANEL
ALT: 25 U/L (ref 0–53)
AST: 22 U/L (ref 0–37)
Albumin: 4.3 g/dL (ref 3.5–5.2)
Alkaline Phosphatase: 55 U/L (ref 39–117)
Bilirubin, Direct: 0.1 mg/dL (ref 0.0–0.3)
Total Bilirubin: 0.3 mg/dL (ref 0.2–1.2)
Total Protein: 7.1 g/dL (ref 6.0–8.3)

## 2018-07-01 LAB — URINALYSIS, ROUTINE W REFLEX MICROSCOPIC
Bilirubin Urine: NEGATIVE
Hgb urine dipstick: NEGATIVE
Ketones, ur: NEGATIVE
Leukocytes,Ua: NEGATIVE
Nitrite: NEGATIVE
RBC / HPF: NONE SEEN (ref 0–?)
Specific Gravity, Urine: 1.015 (ref 1.000–1.030)
Total Protein, Urine: NEGATIVE
Urine Glucose: NEGATIVE
Urobilinogen, UA: 0.2 (ref 0.0–1.0)
WBC, UA: NONE SEEN (ref 0–?)
pH: 6.5 (ref 5.0–8.0)

## 2018-07-01 LAB — BASIC METABOLIC PANEL
BUN: 12 mg/dL (ref 6–23)
CO2: 28 mEq/L (ref 19–32)
Calcium: 9.8 mg/dL (ref 8.4–10.5)
Chloride: 102 mEq/L (ref 96–112)
Creatinine, Ser: 0.79 mg/dL (ref 0.40–1.50)
GFR: 106.38 mL/min (ref >60.00)
Glucose, Bld: 93 mg/dL (ref 70–99)
Potassium: 4.4 mEq/L (ref 3.5–5.1)
Sodium: 138 mEq/L (ref 135–145)

## 2018-07-01 LAB — PSA: PSA: 0.34 ng/mL (ref 0.10–4.00)

## 2018-07-01 LAB — AMMONIA: Ammonia: 59 umol/L — ABNORMAL HIGH (ref 11–35)

## 2018-07-01 LAB — VITAMIN D 25 HYDROXY (VIT D DEFICIENCY, FRACTURES): VITD: 37.39 ng/mL (ref 30.00–100.00)

## 2018-07-01 LAB — MAGNESIUM: Magnesium: 1.9 mg/dL (ref 1.5–2.5)

## 2018-07-01 MED ORDER — INDAPAMIDE 1.25 MG PO TABS: 1.2500 mg | ORAL_TABLET | Freq: Every day | ORAL | 0 refills | Status: DC

## 2018-07-01 MED ORDER — CARVEDILOL 25 MG PO TABS: 50.0000 mg | ORAL_TABLET | Freq: Two times a day (BID) | ORAL | 0 refills | Status: DC

## 2018-07-01 MED ORDER — IRBESARTAN 300 MG PO TABS: 300.0000 mg | ORAL_TABLET | Freq: Every day | ORAL | 1 refills | Status: DC
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
## 2018-07-01 NOTE — Patient Instructions (Signed)

## 2018-07-02 DIAGNOSIS — F4481 Dissociative identity disorder: Secondary | ICD-10-CM | POA: Diagnosis not present

## 2018-07-02 DIAGNOSIS — F4310 Post-traumatic stress disorder, unspecified: Secondary | ICD-10-CM | POA: Diagnosis not present

## 2018-07-03 ENCOUNTER — Telehealth: Payer: Self-pay | Admitting: Internal Medicine

## 2018-07-03 ENCOUNTER — Other Ambulatory Visit: Payer: Self-pay | Admitting: Internal Medicine

## 2018-07-03 DIAGNOSIS — I10 Essential (primary) hypertension: Secondary | ICD-10-CM

## 2018-07-03 DIAGNOSIS — I426 Alcoholic cardiomyopathy: Secondary | ICD-10-CM

## 2018-07-03 MED ORDER — CARVEDILOL 25 MG PO TABS: 50.0000 mg | ORAL_TABLET | Freq: Two times a day (BID) | ORAL | 0 refills | Status: DC

## 2018-07-03 MED ORDER — IRBESARTAN 300 MG PO TABS: 300.0000 mg | ORAL_TABLET | Freq: Every day | ORAL | 1 refills | Status: DC

## 2018-07-03 MED ORDER — INDAPAMIDE 1.25 MG PO TABS: 1.2500 mg | ORAL_TABLET | Freq: Every day | ORAL | 0 refills | Status: DC

## 2018-07-03 NOTE — Telephone Encounter (Signed)
Copied from CRM [id number]. Topic: Quick Communication - Rx Refill/Question >> Jul 03, 2018 10:18 AM Dalphine Handing A wrote: Medication: carvedilol (COREG) 25 MG tablet ,indapamide (LOZOL) 1.25 MG tablet ,irbesartan (AVAPRO) 300 MG tablet (Patient needs prescriptions sent to new pharmacy.)  Has the patient contacted their pharmacy? Yes (Agent: If no, request that the patient contact the pharmacy for the refill.) (Agent: If yes, when and what did the pharmacy advise?)Contact PCP  Preferred Pharmacy (with phone number or street name): [id number] - , [place] - [address] [phone] (Phone) [phone] (Fax)    Agent: Please be advised that RX refills may take up to 3 business days. We ask that you follow-up with your pharmacy.

## 2018-07-04 DIAGNOSIS — F4310 Post-traumatic stress disorder, unspecified: Secondary | ICD-10-CM | POA: Diagnosis not present

## 2018-07-04 DIAGNOSIS — F4481 Dissociative identity disorder: Secondary | ICD-10-CM | POA: Diagnosis not present

## 2018-07-06 LAB — VITAMIN B1: Vitamin B1 (Thiamine): 26 nmol/L (ref 8–30)

## 2018-07-08 DIAGNOSIS — F4310 Post-traumatic stress disorder, unspecified: Secondary | ICD-10-CM | POA: Diagnosis not present

## 2018-07-08 DIAGNOSIS — F4481 Dissociative identity disorder: Secondary | ICD-10-CM | POA: Diagnosis not present

## 2018-07-10 DIAGNOSIS — F4481 Dissociative identity disorder: Secondary | ICD-10-CM | POA: Diagnosis not present

## 2018-07-10 DIAGNOSIS — F4310 Post-traumatic stress disorder, unspecified: Secondary | ICD-10-CM | POA: Diagnosis not present

## 2018-07-11 DIAGNOSIS — F4481 Dissociative identity disorder: Secondary | ICD-10-CM | POA: Diagnosis not present

## 2018-07-11 DIAGNOSIS — F4310 Post-traumatic stress disorder, unspecified: Secondary | ICD-10-CM | POA: Diagnosis not present

## 2018-07-14 DIAGNOSIS — F4481 Dissociative identity disorder: Secondary | ICD-10-CM | POA: Diagnosis not present

## 2018-07-14 DIAGNOSIS — F4310 Post-traumatic stress disorder, unspecified: Secondary | ICD-10-CM | POA: Diagnosis not present

## 2018-07-15 DIAGNOSIS — F4310 Post-traumatic stress disorder, unspecified: Secondary | ICD-10-CM | POA: Diagnosis not present

## 2018-07-15 DIAGNOSIS — F4481 Dissociative identity disorder: Secondary | ICD-10-CM | POA: Diagnosis not present

## 2018-07-17 DIAGNOSIS — F1021 Alcohol dependence, in remission: Secondary | ICD-10-CM | POA: Diagnosis not present

## 2018-07-17 DIAGNOSIS — F4310 Post-traumatic stress disorder, unspecified: Secondary | ICD-10-CM | POA: Diagnosis not present

## 2018-07-17 DIAGNOSIS — F4481 Dissociative identity disorder: Secondary | ICD-10-CM | POA: Diagnosis not present

## 2018-07-21 DIAGNOSIS — F4310 Post-traumatic stress disorder, unspecified: Secondary | ICD-10-CM | POA: Diagnosis not present

## 2018-07-21 DIAGNOSIS — F1021 Alcohol dependence, in remission: Secondary | ICD-10-CM | POA: Diagnosis not present

## 2018-07-21 DIAGNOSIS — F4481 Dissociative identity disorder: Secondary | ICD-10-CM | POA: Diagnosis not present

## 2018-07-24 DIAGNOSIS — F4310 Post-traumatic stress disorder, unspecified: Secondary | ICD-10-CM | POA: Diagnosis not present

## 2018-07-25 DIAGNOSIS — F4481 Dissociative identity disorder: Secondary | ICD-10-CM | POA: Diagnosis not present

## 2018-07-25 DIAGNOSIS — F4310 Post-traumatic stress disorder, unspecified: Secondary | ICD-10-CM | POA: Diagnosis not present

## 2018-07-28 DIAGNOSIS — F4481 Dissociative identity disorder: Secondary | ICD-10-CM | POA: Diagnosis not present

## 2018-07-28 DIAGNOSIS — F4310 Post-traumatic stress disorder, unspecified: Secondary | ICD-10-CM | POA: Diagnosis not present

## 2018-07-29 DIAGNOSIS — F4481 Dissociative identity disorder: Secondary | ICD-10-CM | POA: Diagnosis not present

## 2018-07-29 DIAGNOSIS — F4310 Post-traumatic stress disorder, unspecified: Secondary | ICD-10-CM | POA: Diagnosis not present

## 2018-08-01 DIAGNOSIS — F4310 Post-traumatic stress disorder, unspecified: Secondary | ICD-10-CM | POA: Diagnosis not present

## 2018-08-01 DIAGNOSIS — F4481 Dissociative identity disorder: Secondary | ICD-10-CM | POA: Diagnosis not present

## 2018-08-04 ENCOUNTER — Telehealth: Payer: Self-pay | Admitting: Internal Medicine

## 2018-08-04 NOTE — Telephone Encounter (Signed)
Copied from [place] [id number]. Topic: General - Other >> Aug 04, 2018  2:54 PM Rutherford Nail, NT wrote: Reason for CRM: Patient calling and states that he had a visit with Dr Ronnald Ramp about 1 month ago. Was referred by Lennart Pall. States that he has been awaiting a bed at treatment facility in [place]. States that he received a call that the wait time is roughly 2 weeks before he can get a bed. States that he has been monitoring his own BP since the visit with Dr Ronnald Ramp and it has been running "Low 130s/ mid 67s." Would like to know if Dr Ronnald Ramp needs to see him for a follow up or not? Please advise.  CB#: [phone]

## 2018-08-05 DIAGNOSIS — F4481 Dissociative identity disorder: Secondary | ICD-10-CM | POA: Diagnosis not present

## 2018-08-05 DIAGNOSIS — F4310 Post-traumatic stress disorder, unspecified: Secondary | ICD-10-CM | POA: Diagnosis not present

## 2018-08-06 NOTE — Telephone Encounter (Signed)
I do not necessarily need to see him.  If he wants to come in for me to check his blood pressure I will do that but it sounds like he is doing well.  TJ

## 2018-08-06 NOTE — Telephone Encounter (Signed)
Left detailed message informing patient of same.

## 2018-08-07 DIAGNOSIS — F4310 Post-traumatic stress disorder, unspecified: Secondary | ICD-10-CM | POA: Diagnosis not present

## 2018-08-07 DIAGNOSIS — F4481 Dissociative identity disorder: Secondary | ICD-10-CM | POA: Diagnosis not present

## 2018-08-08 DIAGNOSIS — F4481 Dissociative identity disorder: Secondary | ICD-10-CM | POA: Diagnosis not present

## 2018-08-08 DIAGNOSIS — F4310 Post-traumatic stress disorder, unspecified: Secondary | ICD-10-CM | POA: Diagnosis not present

## 2018-08-11 DIAGNOSIS — F4481 Dissociative identity disorder: Secondary | ICD-10-CM | POA: Diagnosis not present

## 2018-08-11 DIAGNOSIS — F4310 Post-traumatic stress disorder, unspecified: Secondary | ICD-10-CM | POA: Diagnosis not present

## 2018-08-13 DIAGNOSIS — F4481 Dissociative identity disorder: Secondary | ICD-10-CM | POA: Diagnosis not present

## 2018-08-13 DIAGNOSIS — F4310 Post-traumatic stress disorder, unspecified: Secondary | ICD-10-CM | POA: Diagnosis not present

## 2018-08-14 ENCOUNTER — Other Ambulatory Visit: Payer: Self-pay | Admitting: Internal Medicine

## 2018-08-14 DIAGNOSIS — I10 Essential (primary) hypertension: Secondary | ICD-10-CM

## 2018-08-14 DIAGNOSIS — I426 Alcoholic cardiomyopathy: Secondary | ICD-10-CM

## 2018-08-16 DIAGNOSIS — F4481 Dissociative identity disorder: Secondary | ICD-10-CM | POA: Diagnosis not present

## 2018-08-16 DIAGNOSIS — F4310 Post-traumatic stress disorder, unspecified: Secondary | ICD-10-CM | POA: Diagnosis not present

## 2018-08-18 DIAGNOSIS — F4310 Post-traumatic stress disorder, unspecified: Secondary | ICD-10-CM | POA: Diagnosis not present

## 2018-08-18 DIAGNOSIS — I4892 Unspecified atrial flutter: Secondary | ICD-10-CM | POA: Diagnosis not present

## 2018-08-18 DIAGNOSIS — Z8679 Personal history of other diseases of the circulatory system: Secondary | ICD-10-CM | POA: Diagnosis not present

## 2018-08-18 DIAGNOSIS — F1021 Alcohol dependence, in remission: Secondary | ICD-10-CM | POA: Diagnosis not present

## 2018-08-18 DIAGNOSIS — F17213 Nicotine dependence, cigarettes, with withdrawal: Secondary | ICD-10-CM | POA: Diagnosis not present

## 2018-08-18 DIAGNOSIS — R45851 Suicidal ideations: Secondary | ICD-10-CM | POA: Diagnosis not present

## 2018-08-18 DIAGNOSIS — I10 Essential (primary) hypertension: Secondary | ICD-10-CM | POA: Diagnosis not present

## 2018-08-18 DIAGNOSIS — Z62810 Personal history of physical and sexual abuse in childhood: Secondary | ICD-10-CM | POA: Diagnosis not present

## 2018-08-18 DIAGNOSIS — E669 Obesity, unspecified: Secondary | ICD-10-CM | POA: Diagnosis not present

## 2018-08-18 DIAGNOSIS — E559 Vitamin D deficiency, unspecified: Secondary | ICD-10-CM | POA: Diagnosis not present

## 2018-08-18 DIAGNOSIS — F4481 Dissociative identity disorder: Secondary | ICD-10-CM | POA: Diagnosis not present

## 2018-08-18 DIAGNOSIS — G4730 Sleep apnea, unspecified: Secondary | ICD-10-CM | POA: Diagnosis not present

## 2018-08-28 ENCOUNTER — Other Ambulatory Visit: Payer: Self-pay | Admitting: Internal Medicine

## 2018-08-28 DIAGNOSIS — I426 Alcoholic cardiomyopathy: Secondary | ICD-10-CM

## 2018-08-28 DIAGNOSIS — I10 Essential (primary) hypertension: Secondary | ICD-10-CM

## 2018-08-28 MED ORDER — CARVEDILOL 25 MG PO TABS: 25.0000 mg | ORAL_TABLET | Freq: Two times a day (BID) | ORAL | 1 refills | Status: DC

## 2018-09-24 ENCOUNTER — Other Ambulatory Visit: Payer: Self-pay

## 2018-09-24 ENCOUNTER — Encounter: Payer: Self-pay | Admitting: Neurology

## 2018-09-24 DIAGNOSIS — F4310 Post-traumatic stress disorder, unspecified: Secondary | ICD-10-CM | POA: Diagnosis not present

## 2018-09-24 DIAGNOSIS — R29898 Other symptoms and signs involving the musculoskeletal system: Secondary | ICD-10-CM

## 2018-09-24 DIAGNOSIS — F4481 Dissociative identity disorder: Secondary | ICD-10-CM | POA: Diagnosis not present

## 2018-09-24 DIAGNOSIS — R202 Paresthesia of skin: Secondary | ICD-10-CM

## 2018-09-28 ENCOUNTER — Other Ambulatory Visit: Payer: Self-pay | Admitting: Internal Medicine

## 2018-09-28 DIAGNOSIS — I10 Essential (primary) hypertension: Secondary | ICD-10-CM

## 2018-10-02 DIAGNOSIS — F4310 Post-traumatic stress disorder, unspecified: Secondary | ICD-10-CM | POA: Diagnosis not present

## 2018-10-02 DIAGNOSIS — F4481 Dissociative identity disorder: Secondary | ICD-10-CM | POA: Diagnosis not present

## 2018-10-11 DIAGNOSIS — F4481 Dissociative identity disorder: Secondary | ICD-10-CM | POA: Diagnosis not present

## 2018-10-11 DIAGNOSIS — F4310 Post-traumatic stress disorder, unspecified: Secondary | ICD-10-CM | POA: Diagnosis not present

## 2018-11-04 ENCOUNTER — Encounter: Payer: BC Managed Care – PPO | Admitting: Neurology

## 2018-11-17 DIAGNOSIS — F4481 Dissociative identity disorder: Secondary | ICD-10-CM | POA: Diagnosis not present

## 2018-11-17 DIAGNOSIS — F4310 Post-traumatic stress disorder, unspecified: Secondary | ICD-10-CM | POA: Diagnosis not present

## 2018-12-01 DIAGNOSIS — F4481 Dissociative identity disorder: Secondary | ICD-10-CM | POA: Diagnosis not present

## 2018-12-01 DIAGNOSIS — F4310 Post-traumatic stress disorder, unspecified: Secondary | ICD-10-CM | POA: Diagnosis not present

## 2018-12-04 ENCOUNTER — Other Ambulatory Visit: Payer: Self-pay

## 2018-12-04 ENCOUNTER — Telehealth: Payer: Self-pay

## 2018-12-04 ENCOUNTER — Other Ambulatory Visit: Payer: Self-pay | Admitting: Internal Medicine

## 2018-12-04 DIAGNOSIS — R202 Paresthesia of skin: Secondary | ICD-10-CM

## 2018-12-04 DIAGNOSIS — R200 Anesthesia of skin: Secondary | ICD-10-CM | POA: Insufficient documentation

## 2018-12-04 NOTE — Telephone Encounter (Signed)
Copied from [place] [id number]. Topic: General - Other >> Dec 03, 2018  3:40 PM Leward Quan A wrote: Reason for CRM: Patient called to say that he was scheduled for a Nerve Induction Test with [facility] on 12/09/2018 but is also requesting additional orders sent to have this study on the left arm also. Would like this done at the same appointment as the right arm. Per patient office request additional referral request from Dr Ronnald Ramp. Ph# [phone] >> Dec 04, 2018  8:17 AM Cora Daniels D wrote: Another neurology referral needs to be entered with diagnosis regarding his left arm. >> Dec 03, 2018  4:16 PM Cairrikier Dian Queen, CMA wrote: Can you help me with this?

## 2018-12-04 NOTE — Telephone Encounter (Signed)
Pt states that he has 3 of his fingers on the left hand go numb often. This has been occurring for the last 3 weeks.

## 2018-12-04 NOTE — Telephone Encounter (Signed)
What are the symptoms/diagnosis that I should use for the order?  TJ

## 2018-12-04 NOTE — Telephone Encounter (Signed)
Pt is requesting to have the right hand EMG completed. Please advise if this order can be entered.

## 2018-12-08 DIAGNOSIS — F4481 Dissociative identity disorder: Secondary | ICD-10-CM | POA: Diagnosis not present

## 2018-12-08 DIAGNOSIS — F4310 Post-traumatic stress disorder, unspecified: Secondary | ICD-10-CM | POA: Diagnosis not present

## 2018-12-09 ENCOUNTER — Ambulatory Visit (INDEPENDENT_AMBULATORY_CARE_PROVIDER_SITE_OTHER): Payer: BC Managed Care – PPO | Admitting: Neurology

## 2018-12-09 ENCOUNTER — Encounter: Payer: Self-pay | Admitting: Internal Medicine

## 2018-12-09 ENCOUNTER — Other Ambulatory Visit: Payer: Self-pay

## 2018-12-09 ENCOUNTER — Other Ambulatory Visit: Payer: Self-pay | Admitting: Internal Medicine

## 2018-12-09 DIAGNOSIS — R29898 Other symptoms and signs involving the musculoskeletal system: Secondary | ICD-10-CM | POA: Diagnosis not present

## 2018-12-09 DIAGNOSIS — R202 Paresthesia of skin: Secondary | ICD-10-CM

## 2018-12-09 DIAGNOSIS — G622 Polyneuropathy due to other toxic agents: Secondary | ICD-10-CM | POA: Insufficient documentation

## 2018-12-09 NOTE — Procedures (Signed)
[facility]  [address]  [place], [place] [address] Tel: [phone] Fax:  [phone] Test Date:  12/09/2018  Patient: Malik Edwards DOB: [birth year] Physician: Narda Amber, DO  Sex: Male Height: 5\' 8"  Ref Phys: Scarlette Calico, MD  ID#: [id number] Temp: 35.0C Technician:    Patient Complaints: This is a 43 year old man with history of alcohol abuse referred for evaluation of bilateral paresthesias in right hand weakness.  NCV & EMG Findings: Extensive electrodiagnostic testing of the right upper extremity and additional studies of the left shows:  1. Bilateral median sensory responses show prolonged latency (R3.6, L4.2 ms) and reduced amplitude (R10.8, L7.2 V).  Bilateral radial and left ulnar sensory responses show reduced amplitudes (R11.2, L15.4, L3.2 V).  Right ulnar sensory response is absent. 2. Right ulnar motor response at the abductor digit he minimi is absent and severely reduced at the first dorsal interosseous muscle (1.4 mV).  Bilateral median and left ulnar motor responses are within normal limits. 3. In the right upper extremity, despite maximal activation, no motor unit recruitment is seen in the abductor digiti minimi.  Active and chronic changes are present in the right first dorsal interosseous muscle.  Chronic motor axonal loss changes are also seen in the right biceps and deltoid muscles as well as the left first dorsal interosseous muscle.  Impression: 1. 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 2. 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 3. 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   ___________________________ Narda Amber, DO    Nerve Conduction Studies Anti Sensory Summary Table   Site NR Peak (ms) Norm Peak (ms) P-T Amp (V) Norm P-T Amp  Left Median  Anti Sensory (2nd Digit)  35C  Wrist    4.2 <3.4 7.2 >20  Right Median Anti Sensory (2nd Digit)  35C  Wrist    3.6 <3.4 10.8 >20  Left Radial Anti Sensory (Base 1st Digit)  35C  Wrist    2.4 <2.7 15.4 >18  Right Radial Anti Sensory (Base 1st Digit)  35C  Wrist    2.5 <2.7 11.2 >18  Left Ulnar Anti Sensory (5th Digit)  35C  Wrist    3.1 <3.1 3.8 >12  Right Ulnar Anti Sensory (5th Digit)  35C  Wrist NR  <3.1  >12   Motor Summary Table   Site NR Onset (ms) Norm Onset (ms) O-P Amp (mV) Norm O-P Amp Site1 Site2 Delta-0 (ms) Dist (cm) Vel (m/s) Norm Vel (m/s)  Left Median Motor (Abd Poll Brev)  35C  Wrist    3.8 <3.9 7.8 >6 Elbow Wrist 5.5 32.0 58 >50  Elbow    9.3  7.0         Right Median Motor (Abd Poll Brev)  35C  Wrist    3.5 <3.9 8.6 >6 Elbow Wrist 4.9 29.0 59 >50  Elbow    8.4  8.2         Left Ulnar Motor (Abd Dig Minimi)  35C  Wrist    2.1 <3.1 7.4 >7 B Elbow Wrist 4.3 25.0 58 >50  B Elbow    6.4  7.2  A Elbow B Elbow 2.0 10.0 50 >50  A Elbow    8.4  7.0         Right Ulnar Motor (Abd Dig Minimi)  35C  Wrist NR  <3.1  >7 B  Elbow Wrist  0.0  >50  B Elbow NR     A Elbow B Elbow  0.0  >50  A Elbow NR            Right Ulnar (FDI) Motor (1st DI)  35C  Wrist    3.3 <4.3 1.4 >7 B Elbow Wrist 4.2 25.0 60 >50  B Elbow    7.5  1.4  A Elbow B Elbow 1.5 10.0 67 >50  A Elbow    9.0  1.1          EMG   Side Muscle Ins Act Fibs Psw Fasc Number Recrt Dur Dur. Amp Amp. Poly Poly. Comment  Left Abd Poll Brev Nml Nml Nml Nml Nml Nml Nml Nml Nml Nml Nml Nml N/A  Left Ext Indicis Nml Nml Nml Nml 1- Rapid Few 1+ Few 1+ Nml Nml N/A  Left PronatorTeres Nml Nml Nml Nml Nml Nml Nml Nml Nml Nml Nml Nml N/A  Left Biceps Nml Nml Nml Nml Nml Nml Nml Nml Nml Nml Nml Nml N/A  Left Triceps Nml Nml Nml Nml Nml Nml Nml Nml Nml Nml Nml Nml N/A  Left Deltoid Nml Nml Nml Nml Nml Nml Nml Nml Nml Nml Nml Nml N/A  Left 1stDorInt Nml Nml Nml Nml 1- Rapid Few 1+ Few 1+ Nml Nml N/A  Right Deltoid Nml  Nml Nml Nml 1- Rapid Some 1+ Some 1+ Some 1+ N/A  Right 1stDorInt Nml 1+ Nml Nml 2- Rapid Most 1+ Many 1+ Nml Nml ATR  Right Abd Poll Brev Nml Nml Nml Nml Nml Nml Nml Nml Nml Nml Nml Nml N/A  Right Ext Indicis Nml Nml Nml Nml 1- Rapid Few 1+ Few 1+ Nml Nml N/A  Right PronatorTeres Nml Nml Nml Nml Nml Nml Nml Nml Nml Nml Nml Nml N/A  Right Biceps Nml Nml Nml Nml 1- Rapid Some 1+ Some 1+ Some 1+ N/A  Right Triceps Nml Nml Nml Nml Nml Nml Nml Nml Nml Nml Nml Nml N/A  Right ABD Dig Min Nml Nml Nml Nml NE - - - - - - - ATR  Right FlexCarpiUln Nml Nml Nml Nml Nml Nml Nml Nml Nml Nml Nml Nml N/A     Waveforms:

## 2018-12-11 DIAGNOSIS — Z20828 Contact with and (suspected) exposure to other viral communicable diseases: Secondary | ICD-10-CM | POA: Diagnosis not present

## 2018-12-15 DIAGNOSIS — F4310 Post-traumatic stress disorder, unspecified: Secondary | ICD-10-CM | POA: Diagnosis not present

## 2018-12-15 DIAGNOSIS — F4481 Dissociative identity disorder: Secondary | ICD-10-CM | POA: Diagnosis not present

## 2018-12-29 DIAGNOSIS — F4481 Dissociative identity disorder: Secondary | ICD-10-CM | POA: Diagnosis not present

## 2018-12-29 DIAGNOSIS — F4310 Post-traumatic stress disorder, unspecified: Secondary | ICD-10-CM | POA: Diagnosis not present

## 2019-01-05 DIAGNOSIS — F4481 Dissociative identity disorder: Secondary | ICD-10-CM | POA: Diagnosis not present

## 2019-01-05 DIAGNOSIS — F4310 Post-traumatic stress disorder, unspecified: Secondary | ICD-10-CM | POA: Diagnosis not present

## 2019-01-12 ENCOUNTER — Ambulatory Visit (INDEPENDENT_AMBULATORY_CARE_PROVIDER_SITE_OTHER): Payer: BC Managed Care – PPO | Admitting: Internal Medicine

## 2019-01-12 ENCOUNTER — Encounter: Payer: Self-pay | Admitting: Neurology

## 2019-01-12 ENCOUNTER — Encounter: Payer: Self-pay | Admitting: Internal Medicine

## 2019-01-12 ENCOUNTER — Other Ambulatory Visit (INDEPENDENT_AMBULATORY_CARE_PROVIDER_SITE_OTHER): Payer: BC Managed Care – PPO

## 2019-01-12 ENCOUNTER — Other Ambulatory Visit: Payer: Self-pay

## 2019-01-12 ENCOUNTER — Ambulatory Visit (INDEPENDENT_AMBULATORY_CARE_PROVIDER_SITE_OTHER): Payer: BC Managed Care – PPO | Admitting: Neurology

## 2019-01-12 ENCOUNTER — Ambulatory Visit (INDEPENDENT_AMBULATORY_CARE_PROVIDER_SITE_OTHER)
Admission: RE | Admit: 2019-01-12 | Discharge: 2019-01-12 | Disposition: A | Discharge status: Home / Self Care | Payer: BC Managed Care – PPO | Source: Ambulatory Visit | Attending: Internal Medicine | Admitting: Internal Medicine

## 2019-01-12 VITALS — BP 120/80 | HR 72 | Temp 97.5°F | Ht 68.0 in | Wt 296.8 lb

## 2019-01-12 VITALS — BP 140/90 | HR 78 | Ht 68.0 in | Wt 298.0 lb

## 2019-01-12 DIAGNOSIS — I10 Essential (primary) hypertension: Secondary | ICD-10-CM

## 2019-01-12 DIAGNOSIS — M19011 Primary osteoarthritis, right shoulder: Secondary | ICD-10-CM | POA: Diagnosis not present

## 2019-01-12 DIAGNOSIS — M25511 Pain in right shoulder: Secondary | ICD-10-CM | POA: Diagnosis not present

## 2019-01-12 DIAGNOSIS — G621 Alcoholic polyneuropathy: Secondary | ICD-10-CM | POA: Diagnosis not present

## 2019-01-12 DIAGNOSIS — G5621 Lesion of ulnar nerve, right upper limb: Secondary | ICD-10-CM | POA: Diagnosis not present

## 2019-01-12 DIAGNOSIS — R7989 Other specified abnormal findings of blood chemistry: Secondary | ICD-10-CM

## 2019-01-12 DIAGNOSIS — I4892 Unspecified atrial flutter: Secondary | ICD-10-CM | POA: Diagnosis not present

## 2019-01-12 DIAGNOSIS — M67911 Unspecified disorder of synovium and tendon, right shoulder: Secondary | ICD-10-CM

## 2019-01-12 DIAGNOSIS — F4310 Post-traumatic stress disorder, unspecified: Secondary | ICD-10-CM | POA: Diagnosis not present

## 2019-01-12 DIAGNOSIS — F4481 Dissociative identity disorder: Secondary | ICD-10-CM | POA: Diagnosis not present

## 2019-01-12 LAB — BASIC METABOLIC PANEL
BUN: 18 mg/dL (ref 6–23)
CO2: 29 mEq/L (ref 19–32)
Calcium: 9.8 mg/dL (ref 8.4–10.5)
Chloride: 102 mEq/L (ref 96–112)
Creatinine, Ser: 0.87 mg/dL (ref 0.40–1.50)
GFR: 94.94 mL/min (ref >60.00)
Glucose, Bld: 94 mg/dL (ref 70–99)
Potassium: 3.9 mEq/L (ref 3.5–5.1)
Sodium: 139 mEq/L (ref 135–145)

## 2019-01-12 LAB — MAGNESIUM: Magnesium: 1.7 mg/dL (ref 1.5–2.5)

## 2019-01-12 MED ORDER — MELOXICAM 7.5 MG PO TABS: 7.5000 mg | ORAL_TABLET | Freq: Every day | ORAL | 0 refills | Status: DC

## 2019-01-12 NOTE — Progress Notes (Signed)
[facility] Neurology [facility] Clinic Note - Initial Visit   Date: 01/12/19  Malik Edwards MRN: [id number] DOB: 04-15-1975   Dear Dr. Yetta Barre:  Thank you for your kind referral of Malik Edwards for consultation of right ulnar neuropathy and neuropathy. Although his history is well known to you, please allow [place] to reiterate it for the purpose of our medical record. The patient was accompanied to the clinic by self.    History of Present Illness: Malik Edwards is a 43 y.o. right-handed male with history of alcohol abuse, atrial flutter, OSA, depression, hypertension, and hyperlipidemia presenting with right hand weakness.  He had NCS/EMG of the upper extremities on 12/09/2018 for progressive weakness of the right hand.  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  There were mild findings of right C5-6 radiculopathy.  Symptoms of right hand numbness/tingling have been ongoing for the past 5-6 years, but over the past year, he began noticing marked weakness and wasting of the muscles in his hand which prompted him to discuss this with his PCP.  Upon further questioning, patient admits to history of heavy alcohol use.  He has been sober since May 2020.  He was previously drinking whiskey daily for 15 - 20 years.    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  No history of diabetes.   Out-side paper records, electronic medical record, and images have been reviewed where available and summarized as:  NCS/EMG of the upper extremities 12/09/2018: 1. 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 2. 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 3. 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  Lab Results  Component  Value Date   HGBA1C 5.1 06/12/2018   Lab Results  Component Value Date   VITAMINB12 398 12/01/2017   Lab Results  Component Value Date   TSH 4.020 06/12/2018   No results found for: ESRSEDRATE, POCTSEDRATE  Past Medical History:  Diagnosis Date  . Alcoholic (HCC)   . Anxiety   . Atrial flutter (HCC) 2013  . Cardiomyopathy (HCC)   . Colitis   . Depression   . High cholesterol   . Hypertension   . OCD (obsessive compulsive disorder)   . OSA (obstructive sleep apnea) 01/22/2013  . PTSD (post-traumatic stress disorder)     Past Surgical History:  Procedure Laterality Date  . CARDIOVERSION N/A 09/15/2012   Procedure: CARDIOVERSION;  Surgeon: Chrystie Nose, MD;  Location: [facility] OR;  Service: Cardiovascular;  Laterality: N/A;     Medications:  Outpatient Encounter Medications as of 01/12/2019  Medication Sig  . aspirin EC 81 MG EC tablet Take 2 tablets (162 mg total) by mouth daily. For heart health (Patient taking differently: Take 81 mg by mouth daily. For heart health)  . Brexpiprazole (REXULTI) 2 MG TABS Take 1 tablet by mouth daily.  . carvedilol (COREG) 25 MG tablet Take 1 tablet (25 mg total) by mouth 2 (two) times daily with a meal.  . Cholecalciferol (VITAMIN D) 50 MCG (2000 UT) tablet Take 2,000 Units by mouth daily.  . indapamide (LOZOL) 1.25 MG tablet TAKE 1 TABLET (1.25 MG TOTAL) BY MOUTH DAILY.  Marland Kitchen irbesartan (AVAPRO) 300 MG tablet Take 1 tablet (300 mg total) by mouth daily.  . Multiple Vitamin (MULTIVITAMIN WITH MINERALS) TABS tablet Take  1 tablet by mouth daily.  . naltrexone (DEPADE) 50 MG tablet Take 50 mg by mouth daily.  . Vilazodone HCl (VIIBRYD) 40 MG TABS Take 40 mg by mouth daily.   No facility-administered encounter medications on file as of 01/12/2019.    Allergies:  Allergies  Allergen Reactions  . Gabapentin Swelling  . Statins     Elevated LFTs  (Lipitor specifically)    Family History: Family History  Problem Relation Age of Onset  .  Healthy Mother   . Healthy Father   . Cancer Father        prostate cancer  . Heart attack Maternal Grandmother   . Heart attack Maternal Grandfather   . Heart attack Paternal Grandmother   . Sudden Cardiac Death Neg Hx     Social History: Social History   Tobacco Use  . Smoking status: Current Every Day Smoker    Packs/day: 0.25    Years: 20.00    Pack years: 5.00    Types: Cigarettes  . Smokeless tobacco: Never Used  Substance Use Topics  . Alcohol use: Not Currently    Alcohol/week: 12.0 standard drinks    Types: 12 Standard drinks or equivalent per week    Comment: 1/2 gallon liquor per day. drinks about a fifth about 3 times a week.  . Drug use: No    Comment: denied all drug use   Social History   Social History Narrative   Patient lives at home with spouse.   Caffeine Use: 1-2 cups daily   Right handed   Two story home    Review of Systems:  CONSTITUTIONAL: No fevers, chills, night sweats, or weight loss.   EYES: No visual changes or eye pain ENT: No hearing changes.  No history of nose bleeds.   RESPIRATORY: No cough, wheezing and shortness of breath.   CARDIOVASCULAR: Negative for chest pain, and palpitations.   GI: Negative for abdominal discomfort, blood in stools or black stools.  No recent change in bowel habits.   GU:  No history of incontinence.   MUSCLOSKELETAL: No history of joint pain or swelling.  No myalgias.  +weakness SKIN: Negative for lesions, rash, and itching.   HEMATOLOGY/ONCOLOGY: Negative for prolonged bleeding, bruising easily, and swollen nodes.  No history of cancer.   ENDOCRINE: Negative for cold or heat intolerance, polydipsia or goiter.   PSYCH:  +depression or anxiety symptoms.   NEURO: As Above.   Vital Signs:  BP 140/90   Pulse 78   Ht 5\' 8"  (1.727 m)   Wt 298 lb (135.2 kg)   SpO2 98%   BMI 45.31 kg/m    General Medical Exam:   General:  Well appearing, comfortable.   Eyes/ENT: see cranial nerve examination.     Neck:   No carotid bruits. Respiratory:  Clear to auscultation, good air entry bilaterally.   Cardiac:  Regular rate and rhythm, no murmur.   Extremities:  Right claw hand deformity, no edema, or skin discoloration.  Skin:  No rashes or lesions.  Neurological Exam: MENTAL STATUS including orientation to time, place, person, recent and remote memory, attention span and concentration, language, and fund of knowledge is normal.  Speech is not dysarthric.  CRANIAL NERVES: II:  No visual field defects.   III-IV-VI: Pupils equal round and reactive to light.  Normal conjugate, extra-ocular eye movements in all directions of gaze.  No nystagmus.  No ptosis.   V:  Normal facial sensation.    VII:  Normal facial symmetry and movements.   VIII:  Normal hearing and vestibular function.   XII:  Normal tongue is midline.  MOTOR:  Severe FDI and ADM atrophy on the right hand with claw hand deformity.  No fasciculations or abnormal movements.  No pronator drift.   Upper Extremity:  Right  Left  Deltoid  5/5   5/5   Biceps  5/5   5/5   Triceps  5/5   5/5   Infraspinatus 5/5  5/5  Medial pectoralis 5/5  5/5  Wrist extensors  5/5   5/5   Wrist flexors  5-/5   5/5   Finger extensors  5/5   5/5   Finger flexors  4-/5   5/5   Dorsal interossei  3/5   5/5   Abductor pollicis  5-/5   5/5   Tone (Ashworth scale)  0  0   Lower Extremity:  Right  Left  Hip flexors  5/5   5/5   Hip extensors  5/5   5/5   Adductor 5/5  5/5  Abductor 5/5  5/5  Knee flexors  5/5   5/5   Knee extensors  5/5   5/5   Dorsiflexors  5/5   5/5   Plantarflexors  5/5   5/5   Toe extensors  5/5   5/5   Toe flexors  5/5   5/5   Tone (Ashworth scale)  0  0   MSRs:  Right        Left                  brachioradialis 2+  2+  biceps 2+  2+  triceps 2+  2+  patellar 2+  2+  ankle jerk 0  0  Hoffman no  no  plantar response down  down   SENSORY:  Absent pin prick and temperature over the ulnar distribution on the right  hand, and mildly reduced vibration distally otherwise normal and symmetric perception of light touch and pinprick throughout.  Romberg's sign absent.   COORDINATION/GAIT: Normal finger-to- nose-finger.  Intact rapid alternating movements bilaterally.  Gait is mildly wide-based, unassisted and stable.  Tandem and stressed gait intact.    IMPRESSION: Right cubital tunnel syndrome, very severe.  Discussed the pathophysiology, management, and prognosis of ulnar neuropathy.  Patient acknowledges that he frequently used to sit in his recliner leaning on his right elbow, especially after drinking heavily in the past.  He was advised to avoid repetitive compression at the elbow and avoid hyperflexion at the elbow.  Based on the severity of his neurological findings, I will refer him to hand orthopedics to see if there is a role for ulnar nerve decompression.  I suspect he will have permanent neurological deficits given the marked axonal loss on EMG.  I will also refer him to occupational therapy for hand strengthening and assist resources.  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  Patient has been sober since May 2020 and night strongly advised him to continue to abstain from alcohol to minimize the progression of neuropathy. Patient educated on daily foot inspection, fall prevention, and safety precautions around the home.   Thank you for allowing me to participate in patient's care.  If I can answer any additional questions, I would be pleased to do so.    Sincerely,    Kache Mcclurg K. Posey Pronto, DO

## 2019-01-12 NOTE — Progress Notes (Signed)
Subjective:  Patient ID: Malik Edwards, male    DOB: March 30, 1975  Age: 43 y.o. MRN: [id number]  CC: Hypertension and Shoulder Pain   HPI [name] presents for f/up - He complains of a 2-week history of right shoulder pain with gradually decreasing range of motion.  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  He is not getting much symptom relief with 1 dose of Aleve once a day.  Outpatient Medications Prior to Visit  Medication Sig Dispense Refill  . ARIPiprazole (ABILIFY) 2 MG tablet Take 2 mg by mouth daily.    Marland Kitchen aspirin EC 81 MG EC tablet Take 2 tablets (162 mg total) by mouth daily. For heart health (Patient taking differently: Take 81 mg by mouth daily. For heart health)    . Brexpiprazole (REXULTI) 2 MG TABS Take 1 tablet by mouth daily.    . carvedilol (COREG) 25 MG tablet Take 1 tablet (25 mg total) by mouth 2 (two) times daily with a meal. 180 tablet 1  . Cholecalciferol (VITAMIN D) 50 MCG (2000 UT) tablet Take 2,000 Units by mouth daily.    . indapamide (LOZOL) 1.25 MG tablet TAKE 1 TABLET (1.25 MG TOTAL) BY MOUTH DAILY. 90 tablet 0  . irbesartan (AVAPRO) 300 MG tablet Take 1 tablet (300 mg total) by mouth daily. 90 tablet 1  . Multiple Vitamin (MULTIVITAMIN WITH MINERALS) TABS tablet Take 1 tablet by mouth daily.    . Vilazodone HCl (VIIBRYD) 40 MG TABS Take 40 mg by mouth daily.    . naltrexone (DEPADE) 50 MG tablet Take 50 mg by mouth daily.     No facility-administered medications prior to visit.    ROS Review of Systems  Constitutional: Negative for appetite change, diaphoresis and fatigue.  HENT: Negative.   Eyes: Negative for visual disturbance.  Respiratory: Negative for cough, chest tightness, shortness of breath and wheezing.   Cardiovascular: Negative for chest pain, palpitations and leg swelling.  Gastrointestinal: Negative for abdominal pain, constipation, diarrhea, nausea and vomiting.    Endocrine: Negative.   Genitourinary: Negative.  Negative for difficulty urinating.  Musculoskeletal: Positive for arthralgias. Negative for back pain, myalgias and neck pain.  Skin: Negative.  Negative for color change and rash.  Neurological: Negative.  Negative for dizziness, weakness, light-headedness and headaches.  Hematological: Negative for adenopathy. Does not bruise/bleed easily.  Psychiatric/Behavioral: Negative.     Objective:  BP 120/80 (BP Location: Left Arm, Patient Position: Sitting, Cuff Size: Large)   Pulse 72   Temp (!) 97.5 F (36.4 C) (Oral)   Ht 5\' 8"  (1.727 m)   Wt 296 lb 12 oz (134.6 kg)   SpO2 95%   BMI 45.12 kg/m   BP Readings from Last 3 Encounters:  01/12/19 120/80  01/12/19 140/90  07/01/18 (!) 174/110    Wt Readings from Last 3 Encounters:  01/12/19 296 lb 12 oz (134.6 kg)  01/12/19 298 lb (135.2 kg)  07/01/18 259 lb (117.5 kg)    Physical Exam Vitals reviewed.  Constitutional:      Appearance: He is obese.  HENT:     Nose: Nose normal.     Mouth/Throat:     Mouth: Mucous membranes are moist.  Eyes:     General: No scleral icterus.    Conjunctiva/sclera: Conjunctivae normal.  Cardiovascular:     Rate and Rhythm: Normal rate and regular rhythm.     Heart sounds: No murmur.  Pulmonary:     Effort: Pulmonary effort is normal.     Breath sounds: No stridor. No wheezing, rhonchi or rales.  Abdominal:     General: Abdomen is protuberant. Bowel sounds are normal. There is no distension.     Palpations: There is no hepatomegaly or splenomegaly.  Musculoskeletal:     Right shoulder: No swelling, deformity, effusion, tenderness, bony tenderness or crepitus. Decreased range of motion. Normal strength.     Left shoulder: Normal.     Cervical back: Neck supple.     Comments: He has multiple positive rotator cuff signs in the right shoulder  Lymphadenopathy:     Cervical: No cervical adenopathy.  Neurological:     Mental Status: He is  alert.     Lab Results  Component Value Date   WBC 4.0 06/10/2018   HGB 15.7 06/10/2018   HCT 48.1 06/10/2018   PLT 199 06/10/2018   GLUCOSE 94 01/12/2019   CHOL 256 (H) 06/12/2018   TRIG 60 06/12/2018   HDL 117 06/12/2018   LDLCALC 127 (H) 06/12/2018   ALT 25 07/01/2018   AST 22 07/01/2018   NA 139 01/12/2019   K 3.9 01/12/2019   CL 102 01/12/2019   CREATININE 0.87 01/12/2019   BUN 18 01/12/2019   CO2 29 01/12/2019   TSH 2.41 01/12/2019   PSA 0.34 07/01/2018   INR 0.99 12/20/2017   HGBA1C 5.1 06/12/2018    DG Shoulder Right  Result Date: 01/12/2019 CLINICAL DATA:  Pain and limited range of motion for 2 weeks EXAM: RIGHT SHOULDER - 2+ VIEW COMPARISON:  None. FINDINGS: Mild glenohumeral and acromioclavicular arthrosis. No acute bony abnormality. Specifically, no fracture, subluxation, or dislocation. No acute soft tissue abnormality. Included portion of the right chest wall and lung are free of abnormality. IMPRESSION: Mild glenohumeral and acromioclavicular arthrosis. No acute findings. Electronically Signed   By: Lovena Le M.D.   On: 01/12/2019 20:55    Assessment & Plan:   Matei was seen today for hypertension and shoulder pain.  Diagnoses and all orders for this visit:  Essential hypertension- His blood pressure is adequately well controlled. -     Basic metabolic panel; Future -     Thyroid Panel With TSH; Future  Paroxysmal atrial flutter ([place])- This occurred in the distant past and based on his symptoms he has had no recurrence of atrial fibrillation or flutter.  He has made the decision not to be anticoagulated. -     Thyroid Panel With TSH; Future  Hypomagnesemia- His magnesium level is normal now. -     Magnesium; Future  TSH elevation- His TFTs are normal. -     Thyroid Panel With TSH; Future  Acute pain of right shoulder- Plain films are positive for degenerative changes.  I also think there is some component of rotator cuff dysfunction.  I  recommended that he start taking meloxicam and begin physical therapy. -     DG Shoulder Right; Future -     meloxicam (MOBIC) 7.5 MG tablet; Take 1 tablet (7.5 mg total) by mouth daily.  Rotator cuff dysfunction, right -     DG Shoulder Right; Future -     meloxicam (MOBIC) 7.5 MG tablet; Take 1 tablet (7.5 mg total) by mouth daily. -     Ambulatory referral to Physical Therapy   I have discontinued Mora Appl. Enochs's naltrexone. I am also having him start on meloxicam. Additionally, I am having him maintain his aspirin, multivitamin  with minerals, Vitamin D, Vilazodone HCl, brexpiprazole, irbesartan, carvedilol, indapamide, and ARIPiprazole.  Meds ordered this encounter  Medications  . meloxicam (MOBIC) 7.5 MG tablet    Sig: Take 1 tablet (7.5 mg total) by mouth daily.    Dispense:  90 tablet    Refill:  0     Follow-up: Return in about 3 months (around 04/12/2019).  Sanda Linger, MD

## 2019-01-12 NOTE — Patient Instructions (Signed)
Shoulder Pain Many things can cause shoulder pain, including:  An injury to the shoulder.  Overuse of the shoulder.  Arthritis. The source of the pain can be:  Inflammation.  An injury to the shoulder joint.  An injury to a tendon, ligament, or bone. Follow these instructions at home: Pay attention to changes in your symptoms. Let your health care provider know about them. Follow these instructions to relieve your pain. If you have a sling:  Wear the sling as told by your health care provider. Remove it only as told by your health care provider.  Loosen the sling if your fingers tingle, become numb, or turn cold and blue.  Keep the sling clean.  If the sling is not waterproof: ? Do not let it get wet. Remove it to shower or bathe.  Move your arm as little as possible, but keep your hand moving to prevent swelling. Managing pain, stiffness, and swelling   If directed, put ice on the painful area: ? Put ice in a plastic bag. ? Place a towel between your skin and the bag. ? Leave the ice on for 20 minutes, 2-3 times per day. Stop applying ice if it does not help with the pain.  Squeeze a soft ball or a foam pad as much as possible. This helps to keep the shoulder from swelling. It also helps to strengthen the arm. General instructions  Take over-the-counter and prescription medicines only as told by your health care provider.  Keep all follow-up visits as told by your health care provider. This is important. Contact a health care provider if:  Your pain gets worse.  Your pain is not relieved with medicines.  New pain develops in your arm, hand, or fingers. Get help right away if:  Your arm, hand, or fingers: ? Tingle. ? Become numb. ? Become swollen. ? Become painful. ? Turn white or blue. Summary  Shoulder pain can be caused by an injury, overuse, or arthritis.  Pay attention to changes in your symptoms. Let your health care provider know about them.   This condition may be treated with a sling, ice, and pain medicines.  Contact your health care provider if the pain gets worse or new pain develops. Get help right away if your arm, hand, or fingers tingle or become numb, swollen, or painful.  Keep all follow-up visits as told by your health care provider. This is important. This information is not intended to replace advice given to you by your health care provider. Make sure you discuss any questions you have with your health care provider. Document Released: 10/25/2004 Document Revised: 07/30/2017 Document Reviewed: 07/30/2017 Elsevier Patient Education  2020 Elsevier Inc.  

## 2019-01-12 NOTE — Patient Instructions (Signed)
Referral to occupational therapy  Referral to hand orthopeadics  Continue to abstain from alcohol  Avoid repetitive compression at the elbow and over flexing at the elbow

## 2019-01-13 ENCOUNTER — Encounter: Payer: Self-pay | Admitting: Internal Medicine

## 2019-01-13 LAB — THYROID PANEL WITH TSH
Free Thyroxine Index: 2.2 (ref 1.4–3.8)
T3 Uptake: 28 % (ref 22–35)
T4, Total: 7.7 ug/dL (ref 4.9–10.5)
TSH: 2.41 mIU/L (ref 0.40–4.50)

## 2019-01-19 DIAGNOSIS — F4310 Post-traumatic stress disorder, unspecified: Secondary | ICD-10-CM | POA: Diagnosis not present

## 2019-01-19 DIAGNOSIS — F4481 Dissociative identity disorder: Secondary | ICD-10-CM | POA: Diagnosis not present

## 2019-01-26 DIAGNOSIS — F4481 Dissociative identity disorder: Secondary | ICD-10-CM | POA: Diagnosis not present

## 2019-01-26 DIAGNOSIS — F4310 Post-traumatic stress disorder, unspecified: Secondary | ICD-10-CM | POA: Diagnosis not present

## 2019-02-02 DIAGNOSIS — F4481 Dissociative identity disorder: Secondary | ICD-10-CM | POA: Diagnosis not present

## 2019-02-02 DIAGNOSIS — F4310 Post-traumatic stress disorder, unspecified: Secondary | ICD-10-CM | POA: Diagnosis not present

## 2019-02-04 ENCOUNTER — Ambulatory Visit: Payer: BC Managed Care – PPO | Admitting: Orthopaedic Surgery

## 2019-02-09 DIAGNOSIS — F4481 Dissociative identity disorder: Secondary | ICD-10-CM | POA: Diagnosis not present

## 2019-02-09 DIAGNOSIS — F4310 Post-traumatic stress disorder, unspecified: Secondary | ICD-10-CM | POA: Diagnosis not present

## 2019-02-10 ENCOUNTER — Ambulatory Visit (INDEPENDENT_AMBULATORY_CARE_PROVIDER_SITE_OTHER): Payer: BC Managed Care – PPO | Admitting: Orthopaedic Surgery

## 2019-02-10 ENCOUNTER — Other Ambulatory Visit: Payer: Self-pay

## 2019-02-10 DIAGNOSIS — M6281 Muscle weakness (generalized): Secondary | ICD-10-CM | POA: Diagnosis not present

## 2019-02-10 DIAGNOSIS — G5621 Lesion of ulnar nerve, right upper limb: Secondary | ICD-10-CM | POA: Insufficient documentation

## 2019-02-10 NOTE — Progress Notes (Signed)
Office Visit Note   Patient: Malik Edwards           Date of Birth: 1975-07-08           MRN: [id number] Visit Date: 02/10/2019              Requested by: Glendale Chard, DO [address] [place],  [place] [address] PCP: Etta Grandchild, MD   Assessment & Plan: Visit Diagnoses:  1. 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     Plan: Nerve conduction studies were reviewed with the patient which shows ulnar neuropathy at the elbow and at the wrist.  This was discussed with the patient in detail and given the finding of compression at the wrist I have recommended referral to Dr. Betha Loa for surgical evaluation.  We will see the gentleman back as needed.  Follow-Up Instructions: Return if symptoms worsen or fail to improve.   Orders:  No orders of the defined types were placed in this encounter.  No orders of the defined types were placed in this encounter.     Procedures: No procedures performed   Clinical Data: No additional findings.   Subjective: Chief Complaint  Patient presents with  . Right Hand - Pain    Malik Edwards is a 44 year old gentleman who has a history of alcohol abuse for 15 to 20 years with a months of sobriety.  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  He has been referred to [place] by Dr. Allena Katz for ulnar neuropathy of the right upper extremity.  He states that this has been going on for several years.  He has a lot of difficulty with activities of daily living such as eating and brushing his teeth.  He does not really have much pain but he does definitely have numbness.   Review of Systems  Constitutional: Negative.   All other systems reviewed and are negative.    Objective: Vital Signs: There were no vitals taken for this visit.  Physical Exam Vitals and nursing note reviewed.  Constitutional:      Appearance: He is well-developed.  HENT:     Head: Normocephalic and atraumatic.  Eyes:     Pupils: Pupils are equal,  round, and reactive to light.  Pulmonary:     Effort: Pulmonary effort is normal.  Abdominal:     Palpations: Abdomen is soft.  Musculoskeletal:        General: Normal range of motion.     Cervical back: Neck supple.  Skin:    General: Skin is warm.  Neurological:     Mental Status: He is alert and oriented to person, place, and time.  Psychiatric:        Behavior: Behavior normal.        Thought Content: Thought content normal.        Judgment: Judgment normal.     Ortho Exam Right hand exam shows clawing of the ring and small fingers.  The joints are supple.  He has intrinsic wasting.  He has hypothenar atrophy. Specialty Comments:  No specialty comments available.  Imaging: No results found.   PMFS History: Patient Active Problem List   Diagnosis Date Noted  . 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 TSH elevation 01/12/2019  . Acute pain of right shoulder 01/12/2019  . Rotator cuff dysfunction, right 01/12/2019  . 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 Routine general medical examination  at a health care facility 07/01/2018  . Alcohol use disorder, severe, dependence ([place]) 06/14/2018  . Hypomagnesemia 12/20/2017  . Severe obesity (BMI 35.0-35.9 with comorbidity) ([place]) 09/01/2016  . PTSD (post-traumatic stress disorder) 01/13/2015  . MDD (major depressive disorder), recurrent severe, without psychosis ([place]) 01/13/2015  . OSA (obstructive sleep apnea) 01/22/2013  . Tobacco use 10/15/2012  . Paroxysmal atrial flutter ([place]) 09/13/2012  . Cardiomyopathy- etiol uindetermined- EF 30 to 35% 09/13/2012  . HTN (hypertension) 09/13/2012   Past Medical History:  Diagnosis Date  . Alcoholic ([place])   . Anxiety   . Atrial flutter ([place]) 2013  . Cardiomyopathy ([place])   . Colitis   . Depression   . High cholesterol   . Hypertension   . OCD (obsessive compulsive disorder)   . OSA (obstructive sleep apnea) 01/22/2013  . PTSD (post-traumatic  stress disorder)     Family History  Problem Relation Age of Onset  . Healthy Mother   . Healthy Father   . Cancer Father        prostate cancer  . Heart attack Maternal Grandmother   . Heart attack Maternal Grandfather   . Heart attack Paternal Grandmother   . Sudden Cardiac Death Neg Hx     Past Surgical History:  Procedure Laterality Date  . CARDIOVERSION N/A 09/15/2012   Procedure: CARDIOVERSION;  Surgeon: Pixie Casino, MD;  Location: [facility] OR;  Service: Cardiovascular;  Laterality: N/A;   Social History   Occupational History  . Occupation: Manufacturing systems engineer: OTHER    Comment: [employer]  Tobacco Use  . Smoking status: Current Every Day Smoker    Packs/day: 0.25    Years: 20.00    Pack years: 5.00    Types: Cigarettes  . Smokeless tobacco: Never Used  Substance and Sexual Activity  . Alcohol use: Not Currently    Alcohol/week: 12.0 standard drinks    Types: 12 Standard drinks or equivalent per week    Comment: 1/2 gallon liquor per day. drinks about a fifth about 3 times a week.  . Drug use: No    Comment: denied all drug use  . Sexual activity: Yes    Partners: Female    Birth control/protection: None    Comment: wife

## 2019-02-11 NOTE — Addendum Note (Signed)
Addended by: Albertina Parr on: 02/11/2019 10:19 AM   Modules accepted: Orders

## 2019-02-12 ENCOUNTER — Other Ambulatory Visit: Payer: Self-pay

## 2019-02-12 ENCOUNTER — Ambulatory Visit: Payer: BC Managed Care – PPO | Attending: Internal Medicine

## 2019-02-12 DIAGNOSIS — M6281 Muscle weakness (generalized): Secondary | ICD-10-CM | POA: Diagnosis not present

## 2019-02-12 DIAGNOSIS — M25511 Pain in right shoulder: Secondary | ICD-10-CM | POA: Diagnosis not present

## 2019-02-12 NOTE — Therapy (Signed)
[place], [place], [address] Phone: [phone]   Fax:  [phone]  Physical Therapy Evaluation  Patient Details  Name: Malik Edwards MRN: [id number] Date of Birth: [birth year] Referring Provider (PT): Scarlette Calico, MD   Encounter Date: 02/12/2019  PT End of Session - 02/12/19 0741    Visit Number  1    Number of Visits  12    Date for PT Re-Evaluation  03/27/19    Authorization Type  BCBS    PT Start Time  0702    PT Stop Time  0740    PT Time Calculation (min)  38 min    Activity Tolerance  Patient tolerated treatment well;Patient limited by pain    Behavior During Therapy  [facility] for tasks assessed/performed       Past Medical History:  Diagnosis Date  . Alcoholic ([place])   . Anxiety   . Atrial flutter ([place]) 2013  . Cardiomyopathy ([place])   . Colitis   . Depression   . High cholesterol   . Hypertension   . OCD (obsessive compulsive disorder)   . OSA (obstructive sleep apnea) 01/22/2013  . PTSD (post-traumatic stress disorder)     Past Surgical History:  Procedure Laterality Date  . CARDIOVERSION N/A 09/15/2012   Procedure: CARDIOVERSION;  Surgeon: Pixie Casino, MD;  Location: [facility] OR;  Service: Cardiovascular;  Laterality: N/A;    There were no vitals filed for this visit.   Subjective Assessment - 02/12/19 0711    Subjective  RT shoulder pain about a montha go.  No injury.   Medication has helped.    Pertinent History  neuropathy    Limitations  Lifting   taking jacke toff . Work needs to lift arm overhead repeatedly   Diagnostic tests  OA  Per MD on xray    NCV to forearm RT with Dx Neuropathy    Currently in Pain?  Yes    Pain Score  2     Pain Location  Shoulder    Pain Orientation  Right;Anterior    Pain Descriptors / Indicators  Throbbing    Pain Type  Acute pain    Pain Radiating Towards  biceps tendon    Pain Onset  More than a month ago    Pain Frequency  Constant    Aggravating Factors   reaching    Pain Relieving Factors  rest and meds         [facility] PT Assessment - 02/12/19 0001      Assessment   Medical Diagnosis  RT shoulder pain    Referring Provider (PT)  Scarlette Calico, MD    Onset Date/Surgical Date  --   a month or more   Hand Dominance  Right    Next MD Visit  As needed    Prior Therapy  No      Precautions   Precautions  None      Restrictions   Weight Bearing Restrictions  No      Balance Screen   Has the patient fallen in the past 6 months  No      Prior Function   Level of Independence  Independent    Vocation  Full time employment    Vocation Requirements  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 , sawing      Cognition   Overall Cognitive Status  Within Functional Limits for tasks assessed  Observation/Other Assessments   Focus on Therapeutic Outcomes (FOTO)   50% limited      Posture/Postural Control   Posture Comments  mild rounded shoulser      ROM / Strength   AROM / PROM / Strength  AROM;PROM;Strength      AROM   AROM Assessment Site  Shoulder    Right/Left Shoulder  Right;Left    Right Shoulder Flexion  106 Degrees    Right Shoulder ABduction  90 Degrees    Right Shoulder Internal Rotation  50 Degrees    Right Shoulder External Rotation  75 Degrees    Left Shoulder Flexion  130 Degrees    Left Shoulder ABduction  148 Degrees    Left Shoulder Internal Rotation  55 Degrees    Left Shoulder External Rotation  90 Degrees      PROM   Overall PROM Comments  passively RT shoulde equal to LT with end range pain.       Strength   Strength Assessment Site  Shoulder    Right Shoulder Flexion  4/5   pain   Right Shoulder Extension  5/5    Right Shoulder ABduction  4/5   pain   Right Shoulder Internal Rotation  5/5    Right Shoulder External Rotation  4/5   pain   Right Shoulder Horizontal ABduction  5/5    Right Shoulder Horizontal ADduction  5/5                Objective  measurements completed on examination: See above findings.                PT Short Term Goals - 02/12/19 0745      PT SHORT TERM GOAL #1   Title  He will be indpendent with HEP    Time  3    Period  Weeks    Status  New      PT SHORT TERM GOAL #2   Title  He will report pain decr 30% or mroe generally    Time  3    Period  Weeks    Status  New      PT SHORT TERM GOAL #3   Title  He will report pain eased so able to use RT arm more frequently    Time  3    Period  Weeks    Status  New        PT Long Term Goals - 02/12/19 0746      PT LONG TERM GOAL #1   Title  He will be indpendent with all hEP    Time  6    Period  Weeks    Status  New      PT LONG TERM GOAL #2   Title  He will report able to use his RT arm normally for work activity    Time  6    Period  Weeks    Status  New      PT LONG TERM GOAL #3   Title  He will have full active RT shoulder ROM equal to LT with 1-2 maax pain    Time  6    Period  Weeks    Status  New      PT LONG TERM GOAL #4   Title  FOTO will decreased to 29% or better demo improved function    Time  6    Period  Weeks    Status  New  Plan - 02/12/19 0741    Clinical Impression Statement  Mr Diana presentw with pain in RT shoulder without injury but with a job yhat requires repetative use of his RT arm.  His PROM in equal to Lt with end range pain and he is able to give good resistance but stops with pain with ER/Abd /Flexion.   He should improve with skilled PT and HEP but  it is unclear what is the origin of his pain.    Personal Factors and Comorbidities  Profession    Examination-Activity Limitations  Reach Overhead;Other    Examination-Participation Restrictions  Community Activity;Yard Work    Stability/Clinical Decision Making  Stable/Uncomplicated    Clinical Decision Making  Low    Rehab Potential  Good    PT Frequency  2x / week    PT Duration  6 weeks    PT Treatment/Interventions   Electrical Stimulation;Iontophoresis 4mg /ml Dexamethasone;Cryotherapy;Ultrasound;Therapeutic exercise;Manual techniques;Patient/family education;Taping;Passive range of motion;Dry needling    PT Next Visit Plan  Start on isometrics or bands, STW and  gentle ROm and modalities as needed,    PT Home Exercise Plan  scapula retraction    Consulted and Agree with Plan of Care  Patient       Patient will benefit from skilled therapeutic intervention in order to improve the following deficits and impairments:  Pain, Increased muscle spasms, Decreased range of motion, Impaired UE functional use, Postural dysfunction, Decreased strength  Visit Diagnosis: Right shoulder pain, unspecified chronicity  Muscle weakness (generalized)     Problem List Patient Active Problem List   Diagnosis Date Noted  . 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 TSH elevation 01/12/2019  . Acute pain of right shoulder 01/12/2019  . Rotator cuff dysfunction, right 01/12/2019  . 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 Routine general medical examination at a health care facility 07/01/2018  . Alcohol use disorder, severe, dependence (HCC) 06/14/2018  . Hypomagnesemia 12/20/2017  . Severe obesity (BMI 35.0-35.9 with comorbidity) (HCC) 09/01/2016  . PTSD (post-traumatic stress disorder) 01/13/2015  . 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 OSA (obstructive sleep apnea) 01/22/2013  . Tobacco use 10/15/2012  . Paroxysmal atrial flutter (HCC) 09/13/2012  . Cardiomyopathy- etiol uindetermined- EF 30 to 35% 09/13/2012  . HTN (hypertension) 09/13/2012    09/15/2012  PT 02/12/2019, 8:34 AM  [facility] [address] [place], [place], [place] Phone: [phone]   Fax:  [phone]  Name: Ozan Maclay MRN: Lianne Bushy Date of Birth: October 28, 1975

## 2019-02-16 ENCOUNTER — Ambulatory Visit: Payer: BC Managed Care – PPO

## 2019-02-16 ENCOUNTER — Other Ambulatory Visit: Payer: Self-pay

## 2019-02-16 DIAGNOSIS — F4481 Dissociative identity disorder: Secondary | ICD-10-CM | POA: Diagnosis not present

## 2019-02-16 DIAGNOSIS — M6281 Muscle weakness (generalized): Secondary | ICD-10-CM

## 2019-02-16 DIAGNOSIS — F4310 Post-traumatic stress disorder, unspecified: Secondary | ICD-10-CM | POA: Diagnosis not present

## 2019-02-16 DIAGNOSIS — M25511 Pain in right shoulder: Secondary | ICD-10-CM | POA: Diagnosis not present

## 2019-02-16 NOTE — Patient Instructions (Addendum)
Isometrics RT shoulder hold 3-5 sec der x 10 daily                                                                                                                                                                                                              IONTOPHORESIS PATIENT PRECAUTIONS & CONTRAINDICATIONS:  . Redness under one or both electrodes can occur.  This characterized by a uniform redness that usually disappears within 12 hours of treatment. . Small pinhead size blisters may result in response to the drug.  Contact your physician if the problem persists more than 24 hours. . On rare occasions, iontophoresis therapy can result in temporary skin reactions such as rash, inflammation, irritation or burns.  The skin reactions may be the result of individual sensitivity to the ionic solution used, the condition of the skin at the start of treatment, reaction to the materials in the electrodes, allergies or sensitivity to dexamethasone, or a poor connection between the patch and your skin.  Discontinue using iontophoresis if you have any of these reactions and report to your therapist. . Remove the Patch or electrodes if you have any undue sensation of pain or burning during the treatment and report discomfort to your therapist. . Tell your Therapist if you have had known adverse reactions to the application of electrical current. . If using the Patch, the LED light will turn off when treatment is complete and the patch can be removed.  Approximate treatment time is 1-3 hours.  Remove the patch when light goes off or after 6 hours. . The Patch can be worn during normal activity, however excessive motion where the electrodes have been placed can cause poor contact between the skin and the electrode or uneven electrical current resulting in greater risk of skin irritation. Marland Kitchen Keep out of the reach of children.   . DO NOT use if you have a cardiac pacemaker or any other electrically sensitive  implanted device. . DO NOT use if you have a known sensitivity to dexamethasone. . DO NOT use during Magnetic Resonance Imaging (MRI). . DO NOT use over broken or compromised skin (e.g. sunburn, cuts, or acne) due to the increased risk of skin reaction. . DO NOT SHAVE over the area to be treated:  To establish good contact between the Patch and the skin, excessive hair may be clipped. . DO NOT place the Patch or electrodes on or over your eyes, directly over your heart, or brain. . DO NOT reuse the Patch or electrodes as this may  cause burns to occur.

## 2019-02-16 NOTE — Therapy (Addendum)
[facility] [address] [place], [place], [address] Phone: [phone]   Fax:  [phone]  Physical Therapy Treatment  Patient Details  Name: Malik Edwards MRN: [id number] Date of Birth: Mar 11, 1975 Referring Provider (PT): Sanda Linger, MD   Encounter Date: 02/16/2019  PT End of Session - 02/16/19 0704    Visit Number  2    Number of Visits  12    Date for PT Re-Evaluation  03/27/19    Authorization Type  BCBS    PT Start Time  0703    PT Stop Time  0745    PT Time Calculation (min)  42 min    Activity Tolerance  Patient tolerated treatment well;Patient limited by pain    Behavior During Therapy  [facility] for tasks assessed/performed       Past Medical History:  Diagnosis Date  . Alcoholic (H[facility])   . Anxiety   . Atrial flutter (HCC) 2013  . Cardiomyopathy (H[facility])   . Colitis   . Depression   . High cholesterol   . Hypertension   . OCD (obsessive compulsive disorder)   . OSA (obstructive sleep apnea) 01/22/2013  . PTSD (post-traumatic stress disorder)     Past Surgical History:  Procedure Laterality Date  . CARDIOVERSION N/A 09/15/2012   Procedure: CARDIOVERSION;  Surgeon: Chrystie Nose, MD;  Location: [facility] OR;  Service: Cardiovascular;  Laterality: N/A;    There were no vitals filed for this visit.  Subjective Assessment - 02/16/19 0705    Subjective  No pain today to start .    Currently in Pain?  No/denies                       [facility] Adult PT Treatment/Exercise - 02/16/19 0001      Exercises   Exercises  Shoulder      Shoulder Exercises: Seated   Extension  Both;12 reps    Theraband Level (Shoulder Extension)  Level 3 (Green)    Retraction  Both;10 reps    External Rotation  Both;12 reps    Theraband Level (Shoulder External Rotation)  Level 3 (Green)    External Rotation Limitations  short ROM      Modalities   Modalities  Ultrasound;Iontophoresis      Ultrasound   Ultrasound  Location  RT shoulder    Ultrasound Parameters  1.6 Wcm2,  100%     Ultrasound Goals  Pain      Iontophoresis   Type of Iontophoresis  Dexamethasone    Location  RT shoulder posterior    Dose  1cc    Time  4-6 hour      Manual Therapy   Manual Therapy  Soft tissue mobilization    Soft tissue mobilization  RT shoulder             PT Education - 02/16/19 0904    Education Details  HEP,   ionto management    Person(s) Educated  Patient    Methods  Explanation;Tactile cues;Verbal cues;Handout;Demonstration    Comprehension  Verbalized understanding;Returned demonstration       PT Short Term Goals - 02/12/19 0745      PT SHORT TERM GOAL #1   Title  He will be indpendent with HEP    Time  3    Period  Weeks    Status  New      PT SHORT TERM GOAL #2   Title  He will  report pain decr 30% or mroe generally    Time  3    Period  Weeks    Status  New      PT SHORT TERM GOAL #3   Title  He will report pain eased so able to use RT arm more frequently    Time  3    Period  Weeks    Status  New        PT Long Term Goals - 02/12/19 0746      PT LONG TERM GOAL #1   Title  He will be indpendent with all hEP    Time  6    Period  Weeks    Status  New      PT LONG TERM GOAL #2   Title  He will report able to use his RT arm normally for work activity    Time  6    Period  Weeks    Status  New      PT LONG TERM GOAL #3   Title  He will have full active RT shoulder ROM equal to LT with 1-2 maax pain    Time  6    Period  Weeks    Status  New      PT LONG TERM GOAL #4   Title  FOTO will decreased to 29% or better demo improved function    Time  6    Period  Weeks    Status  New            Plan - 02/16/19 1287    Clinical Impression Statement  No increased pain post exercises. tender noted in pectorals On RT  that eased with STW.   ionto explained and cautioned to remove if  skin irritated.  Biofreeze issued with caution to not apply if skin is  already irritated.    PT Treatment/Interventions  Electrical Stimulation;Iontophoresis 4mg /ml Dexamethasone;Cryotherapy;Ultrasound;Therapeutic exercise;Manual techniques;Patient/family education;Taping;Passive range of motion;Dry needling    PT Next Visit Plan  Start on isometrics or bands, STW and  gentle ROm and modalities as needed,    PT Home Exercise Plan  scapula retraction, isometrics    Consulted and Agree with Plan of Care  Patient       Patient will benefit from skilled therapeutic intervention in order to improve the following deficits and impairments:  Pain, Increased muscle spasms, Decreased range of motion, Impaired UE functional use, Postural dysfunction, Decreased strength  Visit Diagnosis: Right shoulder pain, unspecified chronicity  Muscle weakness (generalized)     Problem List Patient Active Problem List   Diagnosis Date Noted  . 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 TSH elevation 01/12/2019  . Acute pain of right shoulder 01/12/2019  . Rotator cuff dysfunction, right 01/12/2019  . 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 Routine general medical examination at a health care facility 07/01/2018  . Alcohol use disorder, severe, dependence ([place]) 06/14/2018  . Hypomagnesemia 12/20/2017  . Severe obesity (BMI 35.0-35.9 with comorbidity) ([place]) 09/01/2016  . PTSD (post-traumatic stress disorder) 01/13/2015  . MDD (major depressive disorder), recurrent severe, without psychosis ([place]) 01/13/2015  . OSA (obstructive sleep apnea) 01/22/2013  . Tobacco use 10/15/2012  . Paroxysmal atrial flutter ([place]) 09/13/2012  . Cardiomyopathy- etiol uindetermined- EF 30 to 35% 09/13/2012  . HTN (hypertension) 09/13/2012    Darrel Hoover  PT 02/16/2019, 9:08 AM  [place], [place],  [address] Phone: [phone]   Fax:  [phone]  Name: Jarel Cuadra MRN:  [id number] Date of Birth: [birth year]

## 2019-02-19 ENCOUNTER — Other Ambulatory Visit: Payer: Self-pay

## 2019-02-19 ENCOUNTER — Ambulatory Visit: Payer: BC Managed Care – PPO

## 2019-02-19 DIAGNOSIS — M6281 Muscle weakness (generalized): Secondary | ICD-10-CM

## 2019-02-19 DIAGNOSIS — M25511 Pain in right shoulder: Secondary | ICD-10-CM | POA: Diagnosis not present

## 2019-02-19 NOTE — Therapy (Addendum)
Malik Edwards, [place], [address] Phone: [phone]   Fax:  [phone]  Physical Therapy Treatment/Discharge  Patient Details  Name: Malik Edwards MRN: [id number] Date of Birth: 1975/10/30 Referring Provider (PT): Scarlette Calico, MD   Encounter Date: 02/19/2019  PT End of Session - 02/19/19 0706    Visit Number  3    Number of Visits  12    Date for PT Re-Evaluation  03/27/19    Authorization Type  BCBS    PT Start Time  0703    PT Stop Time  0745    PT Time Calculation (min)  42 min    Activity Tolerance  Patient tolerated treatment well;Patient limited by pain    Behavior During Therapy  [facility] for tasks assessed/performed       Past Medical History:  Diagnosis Date  . Alcoholic ([place])   . Anxiety   . Atrial flutter ([place]) 2013  . Cardiomyopathy ([place])   . Colitis   . Depression   . High cholesterol   . Hypertension   . OCD (obsessive compulsive disorder)   . OSA (obstructive sleep apnea) 01/22/2013  . PTSD (post-traumatic stress disorder)     Past Surgical History:  Procedure Laterality Date  . CARDIOVERSION N/A 09/15/2012   Procedure: CARDIOVERSION;  Surgeon: Pixie Casino, MD;  Location: [facility] OR;  Service: Cardiovascular;  Laterality: N/A;    There were no vitals filed for this visit.  Subjective Assessment - 02/19/19 0705    Subjective  No pain now. Generally shoulder is feeling better with less pain    Currently in Pain?  No/denies                       [facility] Adult PT Treatment/Exercise - 02/19/19 0001      Shoulder Exercises: Standing   Extension  Both;15 reps    Theraband Level (Shoulder Extension)  Level 2 (Red)    Row  Both;15 reps    Theraband Level (Shoulder Row)  Level 2 (Red)      Shoulder Exercises: ROM/Strengthening   UBE (Upper Arm Bike)  L1 5 min forward      Shoulder Exercises: Isometric Strengthening   Flexion Limitations  10x 5 se    Extension  Limitations  10x5 sec    External Rotation Limitations  10x 5 sec    Internal Rotation Limitations  10 x 5 sec    ABduction Limitations  10x 5 sec      Iontophoresis   Type of Iontophoresis  Dexamethasone    Location  RT shoulder posterior    Dose  1cc    Time  4-6 hour      Manual Therapy   Soft tissue mobilization  RT shoulder               PT Short Term Goals - 02/19/19 0715      PT SHORT TERM GOAL #1   Title  He will be indpendent with HEP    Status  Achieved      PT SHORT TERM GOAL #2   Baseline  Not 30% but better    Status  On-going        PT Long Term Goals - 02/12/19 0746      PT LONG TERM GOAL #1   Title  He will be indpendent with all hEP    Time  6    Period  Weeks  Status  New      PT LONG TERM GOAL #2   Title  He will report able to use his RT arm normally for work activity    Time  6    Period  Weeks    Status  New      PT LONG TERM GOAL #3   Title  He will have full active RT shoulder ROM equal to LT with 1-2 maax pain    Time  6    Period  Weeks    Status  New      PT LONG TERM GOAL #4   Title  FOTO will decreased to 29% or better demo improved function    Time  6    Period  Weeks    Status  New            Plan - 02/19/19 2694    Clinical Impression Statement  improving overall. Added some band exercises standing.  Progress strength and continue ionto and manual    PT Treatment/Interventions  Electrical Stimulation;Iontophoresis 89m/ml Dexamethasone;Cryotherapy;Ultrasound;Therapeutic exercise;Manual techniques;Patient/family education;Taping;Passive range of motion;Dry needling    PT Next Visit Plan  Start on isometrics or bands, STW and  gentle ROm and modalities as needed, ionto    PT Home Exercise Plan  scapula retraction, isometrics, pull downs and rows red band    Consulted and Agree with Plan of Care  Patient       Patient will benefit from skilled therapeutic intervention in order to improve the following  deficits and impairments:  Pain, Increased muscle spasms, Decreased range of motion, Impaired UE functional use, Postural dysfunction, Decreased strength  Visit Diagnosis: Right shoulder pain, unspecified chronicity  Muscle weakness (generalized)     Problem List Patient Active Problem List   Diagnosis Date Noted  . 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 TSH elevation 01/12/2019  . Acute pain of right shoulder 01/12/2019  . Rotator cuff dysfunction, right 01/12/2019  . 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 Routine general medical examination at a health care facility 07/01/2018  . Alcohol use disorder, severe, dependence ([place] 06/14/2018  . Hypomagnesemia 12/20/2017  . Severe obesity (BMI 35.0-35.9 with comorbidity) ([place] 09/01/2016  . PTSD (post-traumatic stress disorder) 01/13/2015  . MDD (major depressive disorder), recurrent severe, without psychosis ([place] 01/13/2015  . OSA (obstructive sleep apnea) 01/22/2013  . Tobacco use 10/15/2012  . Paroxysmal atrial flutter ([place] 09/13/2012  . Cardiomyopathy- etiol uindetermined- EF 30 to 35% 09/13/2012  . HTN (hypertension) 09/13/2012    CDarrel HooverPT 02/19/2019, 7:43 AM  [facility] [address]Phone: [phone]  Fax:  [phone] Name: Malik WoodrickMRN: [id number] of Birth: 101-19-1977 PHYSICAL THERAPY DISCHARGE SUMMARY  Visits from Start of Care: 3  Current functional level related to goals / functional outcomes: See above  He is to have hand surgery so will need clearance to return for his shoulder   Remaining deficits: See above   Education / Equipment: HEP  Plan: Patient agrees to discharge.  Patient goals were not met. Patient is being discharged due to a change in medical status.  ?????    SPearson ForsterPT   02/26/19

## 2019-02-23 ENCOUNTER — Ambulatory Visit: Payer: BC Managed Care – PPO

## 2019-02-23 DIAGNOSIS — F4481 Dissociative identity disorder: Secondary | ICD-10-CM | POA: Diagnosis not present

## 2019-02-23 DIAGNOSIS — F4310 Post-traumatic stress disorder, unspecified: Secondary | ICD-10-CM | POA: Diagnosis not present

## 2019-02-24 ENCOUNTER — Encounter: Payer: Self-pay | Admitting: Internal Medicine

## 2019-02-26 ENCOUNTER — Ambulatory Visit: Payer: BC Managed Care – PPO

## 2019-02-27 ENCOUNTER — Other Ambulatory Visit: Payer: Self-pay | Admitting: Internal Medicine

## 2019-02-27 DIAGNOSIS — J4520 Mild intermittent asthma, uncomplicated: Secondary | ICD-10-CM

## 2019-02-27 MED ORDER — ALBUTEROL SULFATE HFA 108 (90 BASE) MCG/ACT IN AERS: 2.0000 | INHALATION_SPRAY | RESPIRATORY_TRACT | 3 refills | Status: DC | PRN

## 2019-03-02 ENCOUNTER — Ambulatory Visit: Payer: BC Managed Care – PPO

## 2019-03-02 DIAGNOSIS — F4310 Post-traumatic stress disorder, unspecified: Secondary | ICD-10-CM | POA: Diagnosis not present

## 2019-03-02 DIAGNOSIS — F4481 Dissociative identity disorder: Secondary | ICD-10-CM | POA: Diagnosis not present

## 2019-03-05 ENCOUNTER — Ambulatory Visit: Payer: BC Managed Care – PPO

## 2019-03-09 ENCOUNTER — Ambulatory Visit: Payer: BC Managed Care – PPO

## 2019-03-09 DIAGNOSIS — F4481 Dissociative identity disorder: Secondary | ICD-10-CM | POA: Diagnosis not present

## 2019-03-09 DIAGNOSIS — F4310 Post-traumatic stress disorder, unspecified: Secondary | ICD-10-CM | POA: Diagnosis not present

## 2019-03-11 DIAGNOSIS — M25521 Pain in right elbow: Secondary | ICD-10-CM | POA: Diagnosis not present

## 2019-03-11 DIAGNOSIS — G5601 Carpal tunnel syndrome, right upper limb: Secondary | ICD-10-CM | POA: Diagnosis not present

## 2019-03-11 DIAGNOSIS — G5621 Lesion of ulnar nerve, right upper limb: Secondary | ICD-10-CM | POA: Diagnosis not present

## 2019-03-13 ENCOUNTER — Telehealth: Payer: Self-pay | Admitting: *Deleted

## 2019-03-13 NOTE — Telephone Encounter (Signed)
   [facility] Pre-operative Risk Assessment    Request for surgical clearance:  1. What type of surgery is being performed? RIGHT CARPAL TUNNEL RELEASE, RIGHT ULNAR NERVE DECOMPRESSION AT THE WRIST, RIGHT ULNAR NERVE DECOMPRESSION AT THE ELBOW POSSIBLE TRANSPOSITION   2. When is this surgery scheduled? TBD   3. What type of clearance is required (medical clearance vs. Pharmacy clearance to hold med vs. Both)? MEDICAL  4. Are there any medications that need to be held prior to surgery and how long? ASA   5. Practice name and name of physician performing surgery? [facility]; DR. Lennette Bihari KUZMA   6. What is your office phone number [phone]    7.   What is your office fax number [phone]  8.   Anesthesia type (None, local, MAC, general) ? CHOICE   Malik Edwards 03/13/2019, 3:25 PM  _________________________________________________________________   (provider comments below)

## 2019-03-13 NOTE — Telephone Encounter (Signed)
Primary Cardiologist:No primary care provider on file.  Chart reviewed as part of pre-operative protocol coverage. Because of Malik Edwards's past medical history and time since last visit, he/she will require a follow-up visit in order to better assess preoperative cardiovascular risk.  Pre-op covering staff: - Please schedule appointment and call patient to inform them. - Please contact requesting surgeon's office via preferred method (i.e, phone, fax) to inform them of need for appointment prior to surgery.  If applicable, this message will also be routed to pharmacy pool and/or primary cardiologist for input on holding anticoagulant/antiplatelet agent as requested below so that this information is available at time of patient's appointment.   Thomasene Ripple. Saumya Hukill NP-C      [facility] [address] Office [phone] Fax [phone]

## 2019-03-16 DIAGNOSIS — F4481 Dissociative identity disorder: Secondary | ICD-10-CM | POA: Diagnosis not present

## 2019-03-16 DIAGNOSIS — F4310 Post-traumatic stress disorder, unspecified: Secondary | ICD-10-CM | POA: Diagnosis not present

## 2019-03-16 NOTE — Telephone Encounter (Signed)
Pt has pre op appt with Edd Fabian, FNP 03/24/19 @ 4 pm. I will forward pre op notes to FNP for upcoming appt. I will send FYI to Dr. Betha Loa pt has appt 03/24/19 for cardiac clearance. I will remove from the pre op call back pool.

## 2019-03-23 DIAGNOSIS — F4481 Dissociative identity disorder: Secondary | ICD-10-CM | POA: Diagnosis not present

## 2019-03-23 DIAGNOSIS — F4310 Post-traumatic stress disorder, unspecified: Secondary | ICD-10-CM | POA: Diagnosis not present

## 2019-03-23 NOTE — Progress Notes (Addendum)
Cardiology Clinic Note   Patient Name: Malik Edwards Date of Encounter: 03/24/2019  Primary Care Provider:  Etta Grandchild, Edwards Primary Cardiologist:  Malik Fair, Edwards  Patient Profile    Malik Edwards 44 year old male presents today for follow-up of his cardiomyopathy, hypertension, paroxysmal atrial flutter, obstructive sleep apnea and preoperative cardiac evaluation.  Past Medical History    Past Medical History:  Diagnosis Date  . Alcoholic (HCC)   . Anxiety   . Atrial flutter (HCC) 2013  . Cardiomyopathy (HCC)   . Colitis   . Depression   . High cholesterol   . Hypertension   . OCD (obsessive compulsive disorder)   . OSA (obstructive sleep apnea) 01/22/2013  . PTSD (post-traumatic stress disorder)    Past Surgical History:  Procedure Laterality Date  . CARDIOVERSION N/A 09/15/2012   Procedure: CARDIOVERSION;  Surgeon: Malik Nose, Edwards;  Location: [facility] OR;  Service: Cardiovascular;  Laterality: N/A;    Allergies  Allergies  Allergen Reactions  . Gabapentin Swelling  . Statins     Elevated LFTs  (Lipitor specifically)    History of Present Illness  Malik Edwards has a past medical history of atrial flutter tachycardia related to cardiomyopathy obesity, HTN, EtOH and tobacco abuse.  When he was last seen 08/2016 he had been sober for over a year and continued to participate in AA meetings.  He continued to smoke a pack per day.  At that time he was going through a divorce which was amicable.  He had been doing brisk walking, hiking, and enjoyed kayaking.  He had also lost around 34 pounds since he started exercising.  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  He denied chest pain at rest and with exertion, dyspnea, paroxysmal nocturnal dyspnea, syncope, palpitations, claudication, lower extremity edema and hemoptysis.  His blood pressure at that time was elevated.  He had not taken his morning carvedilol and he did not have a home blood  pressure monitor.  He denied headaches.  He had stopped taking his diuretics due to absence of edema.  He was following a low-sodium diet and was enjoying cooking for himself at that time.  He presents the clinic today for follow-up evaluation and preoperative cardiac evaluation.  He states he continues to be very physically active using his elliptical 4-5 times per week for at least 30 minutes.  He follows a heart healthy low-sodium diet.  However he states that he just consumes too many calories.  He has abstained from alcohol and is coming up on 3 years of sobriety.  He has also decreased the amount of smoking he does and is only smoking about 5 cigarettes/day.  He also needs preoperative cardiac evaluation.  This is for a right hand/arm surgery.  Today he  denies chest pain, shortness of breath, lower extremity edema, fatigue, palpitations, melena, hematuria, hemoptysis, diaphoresis, weakness, presyncope, syncope, orthopnea, and PND.  Home Medications    Prior to Admission medications   Medication Sig Start Date End Date Taking? Authorizing Provider  albuterol (VENTOLIN HFA) 108 (90 Base) MCG/ACT inhaler Inhale 2 puffs into the lungs every 4 (four) hours as needed for wheezing or shortness of breath (cough, shortness of breath or wheezing.). 02/27/19   Malik Edwards  ARIPiprazole (ABILIFY) 2 MG tablet Take 2 mg by mouth daily.    Provider, Historical, Edwards  aspirin EC 81 MG EC tablet Take 2 tablets (162 mg total) by mouth daily.  For heart health Patient taking differently: Take 81 mg by mouth daily. For heart health 09/01/15   Malik Edwards  Brexpiprazole (REXULTI) 2 MG TABS Take 1 tablet by mouth daily.    Provider, Historical, Edwards  carvedilol (COREG) 25 MG tablet Take 1 tablet (25 mg total) by mouth 2 (two) times daily with a meal. 08/28/18   Malik Edwards  Cholecalciferol (VITAMIN D) 50 MCG (2000 UT) tablet Take 2,000 Units by mouth daily.    Provider, Historical, Edwards  indapamide  (LOZOL) 1.25 MG tablet TAKE 1 TABLET (1.25 MG TOTAL) BY MOUTH DAILY. 09/28/18   Malik Edwards  irbesartan (AVAPRO) 300 MG tablet Take 1 tablet (300 mg total) by mouth daily. 07/03/18   Malik Edwards  meloxicam (MOBIC) 7.5 MG tablet Take 1 tablet (7.5 mg total) by mouth daily. 01/12/19   Malik Edwards  Multiple Vitamin (MULTIVITAMIN WITH MINERALS) TABS tablet Take 1 tablet by mouth daily.    Provider, Historical, Edwards  Vilazodone HCl (VIIBRYD) 40 MG TABS Take 40 mg by mouth daily.    Provider, Historical, Edwards    Family History    Family History  Problem Relation Age of Onset  . Healthy Mother   . Healthy Father   . Cancer Father        prostate cancer  . Heart attack Maternal Grandmother   . Heart attack Maternal Grandfather   . Heart attack Paternal Grandmother   . Sudden Cardiac Death Neg Hx    He indicated that his mother is alive. He indicated that his father is alive. He indicated that his maternal grandmother is deceased. He indicated that his maternal grandfather is deceased. He indicated that his paternal grandmother is deceased. He indicated that his paternal grandfather is deceased. He indicated that the status of his neg hx is unknown.  Social History    Social History   Socioeconomic History  . Marital status: Married    Spouse name: Cruze Edwards  . Number of children: 0  . Years of education: Assoc. Deg  . Highest education level: Not on file  Occupational History  . Occupation: Forensic scientist: OTHER    Comment: [employer]  Tobacco Use  . Smoking status: Current Every Day Smoker    Packs/day: 0.25    Years: 20.00    Pack years: 5.00    Types: Cigarettes  . Smokeless tobacco: Never Used  Substance and Sexual Activity  . Alcohol use: Not Currently    Alcohol/week: 12.0 standard drinks    Types: 12 Standard drinks or equivalent per week    Comment: 1/2 gallon liquor per day. drinks about a fifth about 3 times a week.  . Drug use: No     Comment: denied all drug use  . Sexual activity: Yes    Partners: Female    Birth control/protection: None    Comment: wife  Other Topics Concern  . Not on file  Social History Narrative   Patient lives at home with spouse.   Caffeine Use: 1-2 cups daily   Right handed   Two story home   Social Determinants of Health   Financial Resource Strain:   . Difficulty of Paying Living Expenses: Not on file  Food Insecurity:   . Worried About Programme researcher, broadcasting/film/video in the Last Year: Not on file  . Ran Out of Food in the Last Year: Not on file  Transportation Needs:   .  Lack of Transportation (Medical): Not on file  . Lack of Transportation (Non-Medical): Not on file  Physical Activity:   . Days of Exercise per Week: Not on file  . Minutes of Exercise per Session: Not on file  Stress:   . Feeling of Stress : Not on file  Social Connections:   . Frequency of Communication with Friends and Family: Not on file  . Frequency of Social Gatherings with Friends and Family: Not on file  . Attends Religious Services: Not on file  . Active Member of Clubs or Organizations: Not on file  . Attends Banker Meetings: Not on file  . Marital Status: Not on file  Intimate Partner Violence:   . Fear of Current or Ex-Partner: Not on file  . Emotionally Abused: Not on file  . Physically Abused: Not on file  . Sexually Abused: Not on file     Review of Systems    General:  No chills, fever, night sweats or weight changes.  Cardiovascular:  No chest pain, dyspnea on exertion, edema, orthopnea, palpitations, paroxysmal nocturnal dyspnea. Dermatological: No rash, lesions/masses Respiratory: No cough, dyspnea Urologic: No hematuria, dysuria Abdominal:   No nausea, vomiting, diarrhea, bright red blood per rectum, melena, or hematemesis Neurologic:  No visual changes, wkns, changes in mental status. All other systems reviewed and are otherwise negative except as noted above.  Physical Exam     VS:  BP 140/89 (BP Location: Left Wrist, Patient Position: Sitting, Cuff Size: Normal)   Pulse 67   Ht 5\' 7"  (1.702 m)   Wt 295 lb (133.8 kg)   BMI 46.20 kg/m  , BMI Body mass index is 46.2 kg/m. GEN: Well nourished, well developed, in no acute distress. HEENT: normal. Neck: Supple, no JVD, carotid bruits, or masses. Cardiac: RRR, no murmurs, rubs, or gallops. No clubbing, cyanosis, edema.  Radials/DP/PT 2+ and equal bilaterally.  Respiratory:  Respirations regular and unlabored, clear to auscultation bilaterally. GI: Soft, nontender, nondistended, BS + x 4. MS: no deformity or atrophy. Skin: warm and dry, no rash. Neuro:  Strength and sensation are intact. Psych: Normal affect.  Accessory Clinical Findings    ECG personally reviewed by me today-normal sinus rhythm possible left atrial enlargement incomplete right bundle branch block 67 bpm- No acute changes  EKG 07/01/2018 Sinus rhythm 63 bpm  Echocardiogram 10/18/2017 Study Conclusions   - Left ventricle: The cavity size was normal. Wall thickness was  increased in a pattern of mild LVH. Systolic function was  moderately to severely reduced. The estimated ejection fraction  was in the range of 30% to 35%. Diffuse hypokinesis. Left  ventricular diastolic function parameters were normal.  - Mitral valve: Mildly thickened leaflets.  - Right atrium: Central venous pressure (est): 3 mm Hg.  - Atrial septum: No defect or patent foramen ovale was identified.  - Tricuspid valve: Acoustic shadowing noted and intermittently seen  echodensity on atrial side of leaflets in subcostal views. In  patient with bacteremia, cannot exclude vegetation. Consider TEE  for further evaluation if clinically indicated.  - Pericardium, extracardiac: There was no pericardial effusion.    Assessment & Plan   1.  Atrial flutter-EKG today shows normal sinus rhythm partial left atrial enlargement incomplete right bundle branch block 67  bpm.  Has not had recurrent episodes of atrial flutter.  He is cardiac aware.  Has maintained weight loss CHA2DS2-VASc score 1(HTN) Continue 81 mg aspirin daily Avoid triggers caffeine, EtOH, chocolate, etc. Continue physical activity  Essential hypertension- BP today 140/89. Well controlled at home 130s over 43s. Continue amlodipine 10 mg daily Continue heart healthy low-sodium diet Continue physical activity-using elliptical 4-5 times a week for 30 minutes   Obesity-weight today 295 pounds.  Up from 259 pounds 07/01/2018.   Continue weight loss Heart healthy low-sodium diet  History of EtOH abuse-has continued to abstain from using EtOH.  Congratulated on abstinence.  Tobacco abuse-currently smoking only 5 cigarettes per day.  Encouraged smoking cessation.  Smoking cessation information given  Preoperative cardiac evaluation-     Primary Cardiologist: Sanda Klein, Edwards  Chart reviewed as part of pre-operative protocol coverage. Given past medical history and time since last visit, based on ACC/AHA guidelines, Vera Wishart would be at acceptable risk for the planned procedure without further cardiovascular testing.   His RCRI risk is a class II risk, 0.9% risk of major cardiac event.  He is able to perform greater than 4 METS of physical activity.  He may hold his aspirin for 5 to 7 days prior to his surgery and resume as soon as hemostasis is achieved.  Edwards will route this recommendation to the requesting party via Epic fax function and remove from pre-op pool.  Please call with questions.  Jossie Ng. [facility] [place] [address] Office [phone] Fax [phone]

## 2019-03-24 ENCOUNTER — Encounter: Payer: Self-pay | Admitting: General Practice

## 2019-03-24 ENCOUNTER — Other Ambulatory Visit: Payer: Self-pay

## 2019-03-24 ENCOUNTER — Ambulatory Visit (INDEPENDENT_AMBULATORY_CARE_PROVIDER_SITE_OTHER): Payer: BC Managed Care – PPO | Admitting: General Practice

## 2019-03-24 VITALS — BP 140/89 | HR 67 | Ht 67.0 in | Wt 295.0 lb

## 2019-03-24 DIAGNOSIS — Z01818 Encounter for other preprocedural examination: Secondary | ICD-10-CM | POA: Diagnosis not present

## 2019-03-24 DIAGNOSIS — I10 Essential (primary) hypertension: Secondary | ICD-10-CM

## 2019-03-24 DIAGNOSIS — I4892 Unspecified atrial flutter: Secondary | ICD-10-CM

## 2019-03-24 DIAGNOSIS — Z720 Tobacco use: Secondary | ICD-10-CM

## 2019-03-24 DIAGNOSIS — F1011 Alcohol abuse, in remission: Secondary | ICD-10-CM

## 2019-03-24 NOTE — Patient Instructions (Addendum)
Medication Instructions:  The current medical regimen is effective;  continue present plan and medications as directed. Please refer to the Current Medication list given to you today. If you need a refill on your cardiac medications before your next appointment, please call your pharmacy.  Special Instructions: CLEARED FOR RIGHT CARPAL TUNNEL RELEASE  Follow-Up: 12 months Please call our office 2 months in advance, DEC 2021 to schedule this FEB 2022 appointment. In Person You may see Thurmon Fair, MD JESSE CLEAVER FNP-C or one of the following Advanced Practice Providers on your designated Care Team:  Azalee Course, PA-C  Micah Flesher, PA-C or  [place], [place].    At [facility], you and your health needs are our priority.  As part of our continuing mission to provide you with exceptional heart care, we have created designated Provider Care Teams.  These Care Teams include your primary Cardiologist (physician) and Advanced Practice Providers (APPs -  Physician Assistants and Nurse Practitioners) who all work together to provide you with the care you need, when you need it.  Reduce your risk of getting COVID-19 With your heart disease it is especially important for people at increased risk of severe illness from COVID-19, and those who live with them, to protect themselves from getting COVID-19. The best way to protect yourself and to help reduce the spread of the virus that causes COVID-19 is to: Marland Kitchen Limit your interactions with other people as much as possible. . Take COVID-19 when you do interact with others. If you start feeling sick and think you may have COVID-19, get in touch with your healthcare provider within 24 hours.  Thank you for choosing [facility] at [facility]!!     Steps to Quit Smoking Smoking tobacco is the leading cause of preventable death. It can affect almost every organ in the body. Smoking puts you and people around you at risk for many serious, long-lasting  (chronic) diseases. Quitting smoking can be hard, but it is one of the best things that you can do for your health. It is never too late to quit. How do I get ready to quit? When you decide to quit smoking, make a plan to help you succeed. Before you quit:  Pick a date to quit. Set a date within the next 2 weeks to give you time to prepare.  Write down the reasons why you are quitting. Keep this list in places where you will see it often.  Tell your family, friends, and co-workers that you are quitting. Their support is important.  Talk with your doctor about the choices that may help you quit.  Find out if your health insurance will pay for these treatments.  Know the people, places, things, and activities that make you want to smoke (triggers). Avoid them. What first steps can I take to quit smoking?  Throw away all cigarettes at home, at work, and in your car.  Throw away the things that you use when you smoke, such as ashtrays and lighters.  Clean your car. Make sure to empty the ashtray.  Clean your home, including curtains and carpets. What can I do to help me quit smoking? Talk with your doctor about taking medicines and seeing a counselor at the same time. You are more likely to succeed when you do both.  If you are pregnant or breastfeeding, talk with your doctor about counseling or other ways to quit smoking. Do not take medicine to help you quit smoking unless your doctor  tells you to do so. To quit smoking: Quit right away  Quit smoking totally, instead of slowly cutting back on how much you smoke over a period of time.  Go to counseling. You are more likely to quit if you go to counseling sessions regularly. Take medicine You may take medicines to help you quit. Some medicines need a prescription, and some you can buy over-the-counter. Some medicines may contain a drug called nicotine to replace the nicotine in cigarettes. Medicines may:  Help you to stop having the  desire to smoke (cravings).  Help to stop the problems that come when you stop smoking (withdrawal symptoms). Your doctor may ask you to use:  Nicotine patches, gum, or lozenges.  Nicotine inhalers or sprays.  Non-nicotine medicine that is taken by mouth. Find resources Find resources and other ways to help you quit smoking and remain smoke-free after you quit. These resources are most helpful when you use them often. They include:  Online chats with a Veterinary surgeon.  Phone quitlines.  Printed Materials engineer.  Support groups or group counseling.  Text messaging programs.  Mobile phone apps. Use apps on your mobile phone or tablet that can help you stick to your quit plan. There are many free apps for mobile phones and tablets as well as websites. Examples include Quit Guide from the [employer] and smokefree.gov  What things can I do to make it easier to quit?   Talk to your family and friends. Ask them to support and encourage you.  Call a phone quitline (1-800-QUIT-NOW), reach out to support groups, or work with a Veterinary surgeon.  Ask people who smoke to not smoke around you.  Avoid places that make you want to smoke, such as: ? Bars. ? Parties. ? Smoke-break areas at work.  Spend time with people who do not smoke.  Lower the stress in your life. Stress can make you want to smoke. Try these things to help your stress: ? Getting regular exercise. ? Doing deep-breathing exercises. ? Doing yoga. ? Meditating. ? Doing a body scan. To do this, close your eyes, focus on one area of your body at a time from head to toe. Notice which parts of your body are tense. Try to relax the muscles in those areas. How will I feel when I quit smoking? Day 1 to 3 weeks Within the first 24 hours, you may start to have some problems that come from quitting tobacco. These problems are very bad 2-3 days after you quit, but they do not often last for more than 2-3 weeks. You may get these  symptoms:  Mood swings.  Feeling restless, nervous, angry, or annoyed.  Trouble concentrating.  Dizziness.  Strong desire for high-sugar foods and nicotine.  Weight gain.  Trouble pooping (constipation).  Feeling like you may vomit (nausea).  Coughing or a sore throat.  Changes in how the medicines that you take for other issues work in your body.  Depression.  Trouble sleeping (insomnia). Week 3 and afterward After the first 2-3 weeks of quitting, you may start to notice more positive results, such as:  Better sense of smell and taste.  Less coughing and sore throat.  Slower heart rate.  Lower blood pressure.  Clearer skin.  Better breathing.  Fewer sick days. Quitting smoking can be hard. Do not give up if you fail the first time. Some people need to try a few times before they succeed. Do your best to stick to your quit plan, and  talk with your doctor if you have any questions or concerns. Summary  Smoking tobacco is the leading cause of preventable death. Quitting smoking can be hard, but it is one of the best things that you can do for your health.  When you decide to quit smoking, make a plan to help you succeed.  Quit smoking right away, not slowly over a period of time.  When you start quitting, seek help from your doctor, family, or friends. This information is not intended to replace advice given to you by your health care provider. Make sure you discuss any questions you have with your health care provider.

## 2019-03-25 NOTE — Progress Notes (Signed)
Yes, OK to hold ASA for 5-7 days for the surgery.

## 2019-03-26 ENCOUNTER — Encounter (HOSPITAL_BASED_OUTPATIENT_CLINIC_OR_DEPARTMENT_OTHER): Payer: Self-pay

## 2019-03-26 ENCOUNTER — Ambulatory Visit (HOSPITAL_BASED_OUTPATIENT_CLINIC_OR_DEPARTMENT_OTHER): Admit: 2019-03-26 | Payer: BC Managed Care – PPO | Admitting: Orthopedic Surgery

## 2019-03-26 SURGERY — CARPAL TUNNEL RELEASE
Anesthesia: Choice | Laterality: Right

## 2019-03-30 DIAGNOSIS — F4481 Dissociative identity disorder: Secondary | ICD-10-CM | POA: Diagnosis not present

## 2019-03-30 DIAGNOSIS — F4310 Post-traumatic stress disorder, unspecified: Secondary | ICD-10-CM | POA: Diagnosis not present

## 2019-03-31 ENCOUNTER — Other Ambulatory Visit: Payer: Self-pay | Admitting: Orthopedic Surgery

## 2019-04-01 DIAGNOSIS — I10 Essential (primary) hypertension: Secondary | ICD-10-CM

## 2019-04-01 DIAGNOSIS — I426 Alcoholic cardiomyopathy: Secondary | ICD-10-CM

## 2019-04-03 MED ORDER — IRBESARTAN 300 MG PO TABS: 300.0000 mg | ORAL_TABLET | Freq: Every day | ORAL | 0 refills | Status: DC

## 2019-04-03 MED ORDER — INDAPAMIDE 1.25 MG PO TABS: 1.2500 mg | ORAL_TABLET | Freq: Every day | ORAL | 0 refills | Status: DC

## 2019-04-03 MED ORDER — CARVEDILOL 25 MG PO TABS: 25.0000 mg | ORAL_TABLET | Freq: Two times a day (BID) | ORAL | 0 refills | Status: DC

## 2019-04-06 DIAGNOSIS — F4481 Dissociative identity disorder: Secondary | ICD-10-CM | POA: Diagnosis not present

## 2019-04-06 DIAGNOSIS — F4310 Post-traumatic stress disorder, unspecified: Secondary | ICD-10-CM | POA: Diagnosis not present

## 2019-04-10 NOTE — Pre-Procedure Instructions (Signed)
Ayad Nieman  04/10/2019      [id number] - [place], [address] [place] [address] Phone: [phone] Fax: [phone]  [id number] [place], [place] - [address] [facility] [address] [address] [place] [address] Phone: [phone] Fax: [phone]  [id number] - [place], [place] - [address] [place] [address] Phone: [phone] Fax: [phone]    Your procedure is scheduled on 04/16/19.  Report to [facility] Admitting at [facility] A[facility]  Call this number if you have problems the morning of surgery:  [phone]   Remember:  Do not eat or drink after midnight.  You may drink clear liquids until 1030 .  Clear liquids allowed are:                    Water, Juice (non-citric and without pulp), Carbonated beverages, Clear Tea and Black Coffee only Please complete your PRE-SURGERY ENSURE that was provided to you by ... the morning of surgery.  Please, if able, drink it in one setting. DO NOT SIP.    Take these medicines the morning of surgery with A SIP OF WATER ----ALL INHALERS,CARVEDILOL,LOZOL,ABILIFY,REXULTI    Do not wear jewelry, make-up or nail polish.  Do not wear lotions, powders, or perfumes, or deodorant.  Do not shave 48 hours prior to surgery.  Men may shave face and neck.  Do not bring valuables to the hospital.  [facility] is not responsible for any belongings or valuables.  Contacts, dentures or bridgework may not be worn into surgery.  Leave your suitcase in the car.  After surgery it may be brought to your room.  For patients admitted to the hospital, discharge time will be determined by your treatment team.  Patients discharged the day of surgery will not be allowed to drive home.    Special instructions:  [place] - Preparing for Surgery  Before surgery, you can play an  important role.  Because skin is not sterile, your skin needs to be as free of germs as possible.  You can reduce the number of germs on you skin by washing with CHG (chlorahexidine gluconate) soap before surgery.  CHG is an antiseptic cleaner which kills germs and bonds with the skin to continue killing germs even after washing.  Oral Hygiene is also important in reducing the risk of infection.  Remember to brush your teeth with your regular toothpaste the morning of surgery.  Please DO NOT use if you have an allergy to CHG or antibacterial soaps.  If your skin becomes reddened/irritated stop using the CHG and inform your nurse when you arrive at Short Stay.  Do not shave (including legs and underarms) for at least 48 hours prior to the first CHG shower.  You may shave your face.  Please follow these instructions carefully:   1.  Shower with CHG Soap the night before surgery and the morning of Surgery.  2.  If you choose to wash your hair, wash your hair first as usual with your normal shampoo.  3.  After you shampoo, rinse your hair and body thoroughly to remove the shampoo. 4.  Use CHG as you would any other liquid soap.  You can apply chg directly to the skin and wash gently with a  scrungie or washcloth.           5.  Apply the CHG Soap to your body ONLY FROM THE NECK DOWN.   Do not use on open wounds or open sores. Avoid contact with your eyes, ears, mouth and genitals (private parts).  Wash genitals (private parts) with your normal soap.  6.  Wash thoroughly, paying special attention to the area where your surgery will be performed.  7.  Thoroughly rinse your body with warm water from the neck down.  8.  DO NOT shower/wash with your normal soap after using and rinsing off the CHG Soap.  9.  Pat yourself dry with a clean towel.            10.  Wear clean pajamas.            11.  Place clean sheets on your bed the night of your first shower and do not sleep with pets.  Day of  Surgery  Do not apply any lotions/deoderants the morning of surgery.   Please wear clean clothes to the hospital/surgery center. Remember to brush your teeth with toothpaste.  Do not take any aspirin,anti-inflammatories,vitamins,or herbal supplements 5-7 days prior to surgery.  Please read over the following fact sheets that you were given.

## 2019-04-13 ENCOUNTER — Encounter (HOSPITAL_COMMUNITY)
Admission: RE | Admit: 2019-04-13 | Discharge: 2019-04-13 | Disposition: A | Discharge status: Home / Self Care | Payer: BC Managed Care – PPO | Source: Ambulatory Visit | Attending: Orthopedic Surgery | Admitting: Orthopedic Surgery

## 2019-04-13 ENCOUNTER — Other Ambulatory Visit (HOSPITAL_COMMUNITY)
Admission: RE | Admit: 2019-04-13 | Discharge: 2019-04-13 | Disposition: A | Discharge status: Home / Self Care | Payer: BC Managed Care – PPO | Source: Ambulatory Visit | Attending: Orthopedic Surgery | Admitting: Orthopedic Surgery

## 2019-04-13 ENCOUNTER — Other Ambulatory Visit: Payer: Self-pay

## 2019-04-13 ENCOUNTER — Encounter (HOSPITAL_COMMUNITY): Payer: Self-pay

## 2019-04-13 DIAGNOSIS — Z20822 Contact with and (suspected) exposure to covid-19: Secondary | ICD-10-CM | POA: Insufficient documentation

## 2019-04-13 DIAGNOSIS — I429 Cardiomyopathy, unspecified: Secondary | ICD-10-CM | POA: Insufficient documentation

## 2019-04-13 DIAGNOSIS — Z7982 Long term (current) use of aspirin: Secondary | ICD-10-CM | POA: Diagnosis not present

## 2019-04-13 DIAGNOSIS — F4310 Post-traumatic stress disorder, unspecified: Secondary | ICD-10-CM | POA: Diagnosis not present

## 2019-04-13 DIAGNOSIS — G4733 Obstructive sleep apnea (adult) (pediatric): Secondary | ICD-10-CM | POA: Insufficient documentation

## 2019-04-13 DIAGNOSIS — Z01818 Encounter for other preprocedural examination: Secondary | ICD-10-CM | POA: Diagnosis not present

## 2019-04-13 DIAGNOSIS — I10 Essential (primary) hypertension: Secondary | ICD-10-CM | POA: Diagnosis not present

## 2019-04-13 DIAGNOSIS — I4892 Unspecified atrial flutter: Secondary | ICD-10-CM | POA: Diagnosis not present

## 2019-04-13 DIAGNOSIS — F4481 Dissociative identity disorder: Secondary | ICD-10-CM | POA: Diagnosis not present

## 2019-04-13 LAB — SARS CORONAVIRUS 2 (TAT 6-24 HRS): SARS Coronavirus 2: NEGATIVE

## 2019-04-13 LAB — BASIC METABOLIC PANEL
Anion gap: 9 (ref 5–15)
BUN: 14 mg/dL (ref 6–20)
CO2: 25 mmol/L (ref 22–32)
Calcium: 9.4 mg/dL (ref 8.9–10.3)
Chloride: 105 mmol/L (ref 98–111)
Creatinine, Ser: 0.85 mg/dL (ref 0.61–1.24)
GFR calc Af Amer: >60 mL/min (ref >60)
GFR calc non Af Amer: >60 mL/min (ref >60)
Glucose, Bld: 101 mg/dL — ABNORMAL HIGH (ref 70–99)
Potassium: 4.2 mmol/L (ref 3.5–5.1)
Sodium: 139 mmol/L (ref 135–145)

## 2019-04-13 LAB — CBC
HCT: 45.6 % (ref 39.0–52.0)
Hemoglobin: 15.4 g/dL (ref 13.0–17.0)
MCH: 30.8 pg (ref 26.0–34.0)
MCHC: 33.8 g/dL (ref 30.0–36.0)
MCV: 91.2 fL (ref 80.0–100.0)
Platelets: 236 10*3/uL (ref 150–400)
RBC: 5 MIL/uL (ref 4.22–5.81)
RDW: 12.6 % (ref 11.5–15.5)
WBC: 7.3 10*3/uL (ref 4.0–10.5)
nRBC: 0 % (ref 0.0–0.2)

## 2019-04-13 NOTE — Progress Notes (Signed)
PCP - Leitha Schuller Cardiologist - Dr Royann Shivers       Clearance given  -  chest x-ray - 11/19 EKG - 2/21 Stress Test - na ECHO -11/19  Cardiac Cath - na  Sleep Study - yes CPAP - yes  Blood Thinner Instructions:asa Aspirin Instructions:stop  ERAS Protcol -yes PRE-SURGERY Ensure or G2- drink given  COVID TEST- for today   Anesthesia review: review heart hx  Patient denies shortness of breath, fever, cough and chest pain at PAT appointment   All instructions explained to the patient, with a verbal understanding of the material. Patient agrees to go over the instructions while at home for a better understanding. Patient also instructed to self quarantine after being tested for COVID-19. The opportunity to ask questions was provided.

## 2019-04-14 NOTE — Progress Notes (Signed)
Anesthesia Chart Review:  Follows with cardiology for hx of cardiomyopathy (EF 30-35% by echo 9/19), hypertension, paroxysmal atrial flutter, obstructive sleep apnea. Seen 03/24/19 for preop clearance. Per note, "Chart reviewed as part of pre-operative protocol coverage. Given past medical history and time since last visit, based on ACC/AHA guidelines, Morey Andonian would be at acceptable risk for the planned procedure without further cardiovascular testing. His RCRI risk is a class II risk, 0.9% risk of major cardiac event.  He is able to perform greater than 4 METS of physical activity. He may hold his aspirin for 5 to 7 days prior to his surgery and resume as soon as hemostasis is achieved. I will route this recommendation to the requesting party via Epic fax function and remove from pre-op pool."  Preop labs reviewed, unremarkable.    EKG 03/24/19: NSR. Possible LAE. IRBBB.  TTE 10/18/17: - Left ventricle: The cavity size was normal. Wall thickness was  increased in a pattern of mild LVH. Systolic function was  moderately to severely reduced. The estimated ejection fraction  was in the range of 30% to 35%. Diffuse hypokinesis. Left  ventricular diastolic function parameters were normal.  - Mitral valve: Mildly thickened leaflets.  - Right atrium: Central venous pressure (est): 3 mm Hg.  - Atrial septum: No defect or patent foramen ovale was identified.  - Tricuspid valve: Acoustic shadowing noted and intermittently seen  echodensity on atrial side of leaflets in subcostal views. In  patient with bacteremia, cannot exclude vegetation. Consider TEE  for further evaluation if clinically indicated.  - Pericardium, extracardiac: There was no pericardial effusion.   Zannie Cove [facility] Short Stay Center/Anesthesiology Phone [phone] 04/14/2019 1:11 PM

## 2019-04-14 NOTE — Anesthesia Preprocedure Evaluation (Addendum)
Anesthesia Evaluation  Patient identified by MRN, date of birth, ID band Patient awake    Reviewed: Allergy & Precautions, H&P , NPO status , Patient's Chart, lab work & pertinent test results, reviewed documented beta blocker date and time   Airway Mallampati: III  TM Distance: >3 FB Neck ROM: Full    Dental no notable dental hx. (+) Teeth Intact, Dental Advisory Given   Pulmonary sleep apnea and Continuous Positive Airway Pressure Ventilation , Current Smoker and Patient abstained from smoking.,    Pulmonary exam normal breath sounds clear to auscultation       Cardiovascular hypertension, Pt. on medications and Pt. on home beta blockers  Rhythm:Regular Rate:Normal     Neuro/Psych Anxiety Depression negative neurological ROS     GI/Hepatic negative GI ROS, Neg liver ROS,   Endo/Other  Morbid obesity  Renal/GU negative Renal ROS  negative genitourinary   Musculoskeletal   Abdominal   Peds  Hematology negative hematology ROS (+)   Anesthesia Other Findings   Reproductive/Obstetrics negative OB ROS                           Anesthesia Physical Anesthesia Plan  ASA: III  Anesthesia Plan: General   Post-op Pain Management:  Regional for Post-op pain   Induction: Intravenous  PONV Risk Score and Plan: 2 and Ondansetron, Dexamethasone and Midazolam  Airway Management Planned: Oral ETT  Additional Equipment:   Intra-op Plan:   Post-operative Plan: Extubation in OR  Informed Consent: I have reviewed the patients History and Physical, chart, labs and discussed the procedure including the risks, benefits and alternatives for the proposed anesthesia with the patient or authorized representative who has indicated his/her understanding and acceptance.     Dental advisory given  Plan Discussed with: CRNA  Anesthesia Plan Comments: (Follows with cardiology for hx of cardiomyopathy (EF  30-35% by echo 9/19), hypertension, paroxysmal atrial flutter, obstructive sleep apnea. Seen 03/24/19 for preop clearance. Per note, "Chart reviewed as part of pre-operative protocol coverage. Given past medical history and time since last visit, based on ACC/AHA guidelines, Malik Edwards would be at acceptable risk for the planned procedure without further cardiovascular testing. His RCRI risk is a class II risk, 0.9% risk of major cardiac event.  He is able to perform greater than 4 METS of physical activity. He may hold his aspirin for 5 to 7 days prior to his surgery and resume as soon as hemostasis is achieved. I will route this recommendation to the requesting party via Epic fax function and remove from pre-op pool."  Preop labs reviewed, unremarkable.    EKG 03/24/19: NSR. Possible LAE. IRBBB.  TTE 10/18/17: - Left ventricle: The cavity size was normal. Wall thickness was  increased in a pattern of mild LVH. Systolic function was  moderately to severely reduced. The estimated ejection fraction  was in the range of 30% to 35%. Diffuse hypokinesis. Left  ventricular diastolic function parameters were normal.  - Mitral valve: Mildly thickened leaflets.  - Right atrium: Central venous pressure (est): 3 mm Hg.  - Atrial septum: No defect or patent foramen ovale was identified.  - Tricuspid valve: Acoustic shadowing noted and intermittently seen  echodensity on atrial side of leaflets in subcostal views. In  patient with bacteremia, cannot exclude vegetation. Consider TEE  for further evaluation if clinically indicated.  - Pericardium, extracardiac: There was no pericardial effusion. )      Anesthesia Quick  Evaluation

## 2019-04-15 MED ORDER — DEXTROSE 5 % IV SOLN
3.0000 g | INTRAVENOUS | Status: AC
  Administered 2019-04-16: 3 g via INTRAVENOUS
  Filled 2019-04-15: qty 3

## 2019-04-16 ENCOUNTER — Encounter (HOSPITAL_COMMUNITY): Payer: Self-pay | Admitting: Orthopedic Surgery

## 2019-04-16 ENCOUNTER — Ambulatory Visit (HOSPITAL_COMMUNITY): Payer: BC Managed Care – PPO | Admitting: Certified Registered Nurse Anesthetist

## 2019-04-16 ENCOUNTER — Encounter (HOSPITAL_COMMUNITY)
Admission: RE | Disposition: A | Discharge status: Home / Self Care | Payer: Self-pay | Source: Home / Self Care | Attending: Orthopedic Surgery

## 2019-04-16 ENCOUNTER — Ambulatory Visit (HOSPITAL_COMMUNITY)
Admission: RE | Admit: 2019-04-16 | Discharge: 2019-04-16 | Disposition: A | Discharge status: Home / Self Care | Payer: BC Managed Care – PPO | Attending: Orthopedic Surgery | Admitting: Orthopedic Surgery

## 2019-04-16 ENCOUNTER — Other Ambulatory Visit: Payer: Self-pay

## 2019-04-16 ENCOUNTER — Ambulatory Visit (HOSPITAL_COMMUNITY): Payer: BC Managed Care – PPO | Admitting: Physician Assistant

## 2019-04-16 DIAGNOSIS — G5621 Lesion of ulnar nerve, right upper limb: Secondary | ICD-10-CM | POA: Diagnosis not present

## 2019-04-16 DIAGNOSIS — I429 Cardiomyopathy, unspecified: Secondary | ICD-10-CM | POA: Insufficient documentation

## 2019-04-16 DIAGNOSIS — G4733 Obstructive sleep apnea (adult) (pediatric): Secondary | ICD-10-CM | POA: Diagnosis not present

## 2019-04-16 DIAGNOSIS — F429 Obsessive-compulsive disorder, unspecified: Secondary | ICD-10-CM | POA: Insufficient documentation

## 2019-04-16 DIAGNOSIS — Z888 Allergy status to other drugs, medicaments and biological substances status: Secondary | ICD-10-CM | POA: Diagnosis not present

## 2019-04-16 DIAGNOSIS — F17200 Nicotine dependence, unspecified, uncomplicated: Secondary | ICD-10-CM | POA: Diagnosis not present

## 2019-04-16 DIAGNOSIS — F329 Major depressive disorder, single episode, unspecified: Secondary | ICD-10-CM | POA: Diagnosis not present

## 2019-04-16 DIAGNOSIS — G5601 Carpal tunnel syndrome, right upper limb: Secondary | ICD-10-CM | POA: Diagnosis not present

## 2019-04-16 DIAGNOSIS — E7800 Pure hypercholesterolemia, unspecified: Secondary | ICD-10-CM | POA: Insufficient documentation

## 2019-04-16 DIAGNOSIS — F419 Anxiety disorder, unspecified: Secondary | ICD-10-CM | POA: Diagnosis not present

## 2019-04-16 DIAGNOSIS — F17210 Nicotine dependence, cigarettes, uncomplicated: Secondary | ICD-10-CM | POA: Diagnosis not present

## 2019-04-16 DIAGNOSIS — I10 Essential (primary) hypertension: Secondary | ICD-10-CM | POA: Insufficient documentation

## 2019-04-16 DIAGNOSIS — Z6841 Body Mass Index (BMI) 40.0 and over, adult: Secondary | ICD-10-CM | POA: Diagnosis not present

## 2019-04-16 DIAGNOSIS — G4730 Sleep apnea, unspecified: Secondary | ICD-10-CM | POA: Insufficient documentation

## 2019-04-16 DIAGNOSIS — Z8249 Family history of ischemic heart disease and other diseases of the circulatory system: Secondary | ICD-10-CM | POA: Diagnosis not present

## 2019-04-16 DIAGNOSIS — G8918 Other acute postprocedural pain: Secondary | ICD-10-CM | POA: Diagnosis not present

## 2019-04-16 DIAGNOSIS — Z8042 Family history of malignant neoplasm of prostate: Secondary | ICD-10-CM | POA: Diagnosis not present

## 2019-04-16 HISTORY — PX: CARPAL TUNNEL RELEASE: SHX101

## 2019-04-16 HISTORY — PX: ULNAR NERVE TRANSPOSITION: SHX2595

## 2019-04-16 SURGERY — CARPAL TUNNEL RELEASE
Anesthesia: General | Site: Wrist | Laterality: Right

## 2019-04-16 MED ORDER — LIDOCAINE 2% (20 MG/ML) 5 ML SYRINGE
INTRAMUSCULAR | Status: AC
  Filled 2019-04-16: qty 5

## 2019-04-16 MED ORDER — PROPOFOL 10 MG/ML IV BOLUS
INTRAVENOUS | Status: AC
  Filled 2019-04-16: qty 40

## 2019-04-16 MED ORDER — 0.9 % SODIUM CHLORIDE (POUR BTL) OPTIME
TOPICAL | Status: DC | PRN
  Administered 2019-04-16: 1000 mL

## 2019-04-16 MED ORDER — LIDOCAINE 2% (20 MG/ML) 5 ML SYRINGE
INTRAMUSCULAR | Status: DC | PRN
  Administered 2019-04-16: 80 mg via INTRAVENOUS

## 2019-04-16 MED ORDER — LACTATED RINGERS IV SOLN: INTRAVENOUS | Status: DC

## 2019-04-16 MED ORDER — HYDROCODONE-ACETAMINOPHEN 5-325 MG PO TABS: ORAL_TABLET | ORAL | 0 refills | Status: DC

## 2019-04-16 MED ORDER — CHLORHEXIDINE GLUCONATE 4 % EX LIQD: 60.0000 mL | Freq: Once | CUTANEOUS | Status: DC

## 2019-04-16 MED ORDER — PHENYLEPHRINE HCL (PRESSORS) 10 MG/ML IV SOLN
INTRAVENOUS | Status: AC
  Filled 2019-04-16: qty 1

## 2019-04-16 MED ORDER — EPHEDRINE SULFATE-NACL 50-0.9 MG/10ML-% IV SOSY
PREFILLED_SYRINGE | INTRAVENOUS | Status: DC | PRN
  Administered 2019-04-16 (×4): 10 mg via INTRAVENOUS

## 2019-04-16 MED ORDER — PHENYLEPHRINE HCL-NACL 10-0.9 MG/250ML-% IV SOLN
INTRAVENOUS | Status: DC | PRN
  Administered 2019-04-16: 25 ug/min via INTRAVENOUS

## 2019-04-16 MED ORDER — FENTANYL CITRATE (PF) 250 MCG/5ML IJ SOLN
INTRAMUSCULAR | Status: AC
  Filled 2019-04-16: qty 5

## 2019-04-16 MED ORDER — FENTANYL CITRATE (PF) 100 MCG/2ML IJ SOLN
INTRAMUSCULAR | Status: AC
  Administered 2019-04-16: 100 ug via INTRAVENOUS
  Filled 2019-04-16: qty 2

## 2019-04-16 MED ORDER — ACETAMINOPHEN 500 MG PO TABS: 1000.0000 mg | ORAL_TABLET | ORAL | Status: AC

## 2019-04-16 MED ORDER — CELECOXIB 200 MG PO CAPS: 200.0000 mg | ORAL_CAPSULE | ORAL | Status: AC

## 2019-04-16 MED ORDER — MIDAZOLAM HCL 2 MG/2ML IJ SOLN: 2.0000 mg | Freq: Once | INTRAMUSCULAR | Status: AC

## 2019-04-16 MED ORDER — ACETAMINOPHEN 500 MG PO TABS
ORAL_TABLET | ORAL | Status: AC
  Administered 2019-04-16: 1000 mg via ORAL
  Filled 2019-04-16: qty 2

## 2019-04-16 MED ORDER — GLYCOPYRROLATE PF 0.2 MG/ML IJ SOSY
PREFILLED_SYRINGE | INTRAMUSCULAR | Status: DC | PRN
  Administered 2019-04-16: .2 mg via INTRAVENOUS

## 2019-04-16 MED ORDER — GLYCOPYRROLATE PF 0.2 MG/ML IJ SOSY
PREFILLED_SYRINGE | INTRAMUSCULAR | Status: AC
  Filled 2019-04-16: qty 1

## 2019-04-16 MED ORDER — MIDAZOLAM HCL 2 MG/2ML IJ SOLN
INTRAMUSCULAR | Status: AC
  Administered 2019-04-16: 2 mg via INTRAVENOUS
  Filled 2019-04-16: qty 2

## 2019-04-16 MED ORDER — DEXAMETHASONE SODIUM PHOSPHATE 10 MG/ML IJ SOLN
INTRAMUSCULAR | Status: AC
  Filled 2019-04-16: qty 1

## 2019-04-16 MED ORDER — MIDAZOLAM HCL 5 MG/5ML IJ SOLN
INTRAMUSCULAR | Status: DC | PRN
  Administered 2019-04-16: 2 mg via INTRAVENOUS

## 2019-04-16 MED ORDER — ROPIVACAINE HCL 5 MG/ML IJ SOLN
INTRAMUSCULAR | Status: DC | PRN
  Administered 2019-04-16: 10 mL via PERINEURAL

## 2019-04-16 MED ORDER — BUPIVACAINE-EPINEPHRINE (PF) 0.5% -1:200000 IJ SOLN
INTRAMUSCULAR | Status: DC | PRN
  Administered 2019-04-16: 30 mL via PERINEURAL

## 2019-04-16 MED ORDER — MIDAZOLAM HCL 2 MG/2ML IJ SOLN
INTRAMUSCULAR | Status: AC
  Filled 2019-04-16: qty 2

## 2019-04-16 MED ORDER — PHENYLEPHRINE 40 MCG/ML (10ML) SYRINGE FOR IV PUSH (FOR BLOOD PRESSURE SUPPORT)
PREFILLED_SYRINGE | INTRAVENOUS | Status: DC | PRN
  Administered 2019-04-16 (×4): 80 ug via INTRAVENOUS

## 2019-04-16 MED ORDER — PROPOFOL 10 MG/ML IV BOLUS
INTRAVENOUS | Status: DC | PRN
  Administered 2019-04-16: 200 mg via INTRAVENOUS
  Administered 2019-04-16: 60 mg via INTRAVENOUS

## 2019-04-16 MED ORDER — ONDANSETRON HCL 4 MG/2ML IJ SOLN
INTRAMUSCULAR | Status: AC
  Filled 2019-04-16: qty 2

## 2019-04-16 MED ORDER — DEXAMETHASONE SODIUM PHOSPHATE 10 MG/ML IJ SOLN
INTRAMUSCULAR | Status: DC | PRN
  Administered 2019-04-16: 10 mg via INTRAVENOUS

## 2019-04-16 MED ORDER — CELECOXIB 200 MG PO CAPS
ORAL_CAPSULE | ORAL | Status: AC
  Administered 2019-04-16: 200 mg via ORAL
  Filled 2019-04-16: qty 1

## 2019-04-16 MED ORDER — FENTANYL CITRATE (PF) 100 MCG/2ML IJ SOLN: 100.0000 ug | Freq: Once | INTRAMUSCULAR | Status: AC

## 2019-04-16 MED ORDER — ONDANSETRON HCL 4 MG/2ML IJ SOLN
INTRAMUSCULAR | Status: DC | PRN
  Administered 2019-04-16: 4 mg via INTRAVENOUS

## 2019-04-16 SURGICAL SUPPLY — 40 items
BNDG ELASTIC 3X5.8 VLCR STR LF (GAUZE/BANDAGES/DRESSINGS) ×3 IMPLANT
BNDG ELASTIC 4X5.8 VLCR STR LF (GAUZE/BANDAGES/DRESSINGS) ×3 IMPLANT
BNDG ESMARK 4X9 LF (GAUZE/BANDAGES/DRESSINGS) ×3 IMPLANT
BNDG GAUZE ELAST 4 BULKY (GAUZE/BANDAGES/DRESSINGS) ×3 IMPLANT
CHLORAPREP W/TINT 26 (MISCELLANEOUS) ×3 IMPLANT
CORD BIPOLAR FORCEPS 12FT (ELECTRODE) ×3 IMPLANT
COVER SURGICAL LIGHT HANDLE (MISCELLANEOUS) ×3 IMPLANT
COVER WAND RF STERILE (DRAPES) ×3 IMPLANT
CUFF TOURN SGL QUICK 18X4 (TOURNIQUET CUFF) ×3 IMPLANT
DRAPE SURG 17X23 STRL (DRAPES) ×3 IMPLANT
DRSG PAD ABDOMINAL 8X10 ST (GAUZE/BANDAGES/DRESSINGS) ×6 IMPLANT
DRSG XEROFORM 1X8 (GAUZE/BANDAGES/DRESSINGS) ×3 IMPLANT
GAUZE SPONGE 4X4 12PLY STRL (GAUZE/BANDAGES/DRESSINGS) ×3 IMPLANT
GAUZE XEROFORM 1X8 LF (GAUZE/BANDAGES/DRESSINGS) ×3 IMPLANT
GLOVE BIO SURGEON STRL SZ7.5 (GLOVE) ×6 IMPLANT
GLOVE BIOGEL PI IND STRL 8 (GLOVE) ×2 IMPLANT
GLOVE BIOGEL PI INDICATOR 8 (GLOVE) ×1
GOWN STRL REUS W/ TWL LRG LVL3 (GOWN DISPOSABLE) ×4 IMPLANT
GOWN STRL REUS W/TWL LRG LVL3 (GOWN DISPOSABLE) ×6
KIT BASIN OR (CUSTOM PROCEDURE TRAY) ×3 IMPLANT
KIT TURNOVER KIT B (KITS) ×3 IMPLANT
NS IRRIG 1000ML POUR BTL (IV SOLUTION) ×3 IMPLANT
PACK ORTHO EXTREMITY (CUSTOM PROCEDURE TRAY) ×3 IMPLANT
PAD ARMBOARD 7.5X6 YLW CONV (MISCELLANEOUS) ×3 IMPLANT
PADDING CAST ABS 4INX4YD NS (CAST SUPPLIES) ×1
PADDING CAST ABS COTTON 4X4 ST (CAST SUPPLIES) ×2 IMPLANT
SPONGE LAP 4X18 RFD (DISPOSABLE) ×3 IMPLANT
SUCTION FRAZIER HANDLE 10FR (MISCELLANEOUS)
SUCTION TUBE FRAZIER 10FR DISP (MISCELLANEOUS) IMPLANT
SUT ETHILON 2 0 PSLX (SUTURE) ×3 IMPLANT
SUT ETHILON 3 0 PS 1 (SUTURE) ×3 IMPLANT
SUT ETHILON 4 0 PS 2 18 (SUTURE) ×3 IMPLANT
SUT VIC AB 1 CT1 27 (SUTURE) ×3
SUT VIC AB 1 CT1 27XBRD ANBCTR (SUTURE) ×2 IMPLANT
SUT VIC AB 4-0 PS2 27 (SUTURE) ×3 IMPLANT
TOWEL GREEN STERILE (TOWEL DISPOSABLE) ×3 IMPLANT
TOWEL GREEN STERILE FF (TOWEL DISPOSABLE) ×3 IMPLANT
TUBE CONNECTING 12X1/4 (SUCTIONS) IMPLANT
UNDERPAD 30X30 (UNDERPADS AND DIAPERS) ×3 IMPLANT
WATER STERILE IRR 1000ML POUR (IV SOLUTION) ×3 IMPLANT

## 2019-04-16 NOTE — Anesthesia Procedure Notes (Signed)
Anesthesia Regional Block: Supraclavicular block   Pre-Anesthetic Checklist: ,, timeout performed, Correct Patient, Correct Site, Correct Laterality, Correct Procedure, Correct Position, site marked, Risks and benefits discussed, pre-op evaluation,  At surgeon's request and post-op pain management  Laterality: Right  Prep: Maximum Sterile Barrier Precautions used, chloraprep       Needles:  Injection technique: Single-shot  Needle Type: Echogenic Stimulator Needle     Needle Length: 5cm  Needle Gauge: 22     Additional Needles:   Procedures:, nerve stimulator,,, ultrasound used (permanent image in chart),,,,  Narrative:  Start time: 04/16/2019 2:35 PM End time: 04/16/2019 2:45 PM Injection made incrementally with aspirations every 5 mL. Anesthesiologist: Gaynelle Adu, MD  Additional Notes: 2% Lidocaine skin wheel. Intercostobrachial block with 10cc of 0.5% Ropivicaine.

## 2019-04-16 NOTE — Transfer of Care (Signed)
Immediate Anesthesia Transfer of Care Note  Patient: Malik Edwards  Procedure(s) Performed: CARPAL TUNNEL RELEASE (Right Wrist) RIGHT ULNAR NERVE DECOMPRESSION AT WRIST, RIGHT ULNAR NERVE AT ELBOW (Right Arm Lower)  Patient Location: PACU  Anesthesia Type:GA combined with regional for post-op pain  Level of Consciousness: awake and patient cooperative  Airway & Oxygen Therapy: Patient Spontanous Breathing and Patient connected to nasal cannula oxygen  Post-op Assessment: Report given to RN and Post -op Vital signs reviewed and stable  Post vital signs: Reviewed and stable  Last Vitals:  Vitals Value Taken Time  BP 116/74 04/16/19 1751  Temp    Pulse 72 04/16/19 1754  Resp 19 04/16/19 1754  SpO2 100 % 04/16/19 1754  Vitals shown include unvalidated device data.  Last Pain:  Vitals:   04/16/19 1456  PainSc: 0-No pain      Patients Stated Pain Goal: 0 (04/16/19 1339)  Complications: No apparent anesthesia complications

## 2019-04-16 NOTE — Anesthesia Procedure Notes (Signed)
Procedure Name: LMA Insertion Date/Time: 04/16/2019 4:10 PM Performed by: Adria Dill, CRNA Pre-anesthesia Checklist: Patient identified, Emergency Drugs available, Suction available and Patient being monitored Patient Re-evaluated:Patient Re-evaluated prior to induction Oxygen Delivery Method: Circle system utilized Preoxygenation: Pre-oxygenation with 100% oxygen Induction Type: IV induction LMA: LMA inserted LMA Size: 5.0 Number of attempts: 2 Placement Confirmation: positive ETCO2 and breath sounds checked- equal and bilateral Tube secured with: Tape Dental Injury: Teeth and Oropharynx as per pre-operative assessment

## 2019-04-16 NOTE — H&P (Signed)
Malik Edwards is an 44 y.o. male.   Chief Complaint: right hand numbness HPI: 44 yo male with numbness and tingling right hand x 5 years.  Positive nerve conduction studies.  He wishes to have right carpal tunnel release and ulnar nerve decompression at the wrist and elbow with transposition as necessary.  Allergies:  Allergies  Allergen Reactions  . Gabapentin Swelling    SWELLING REACTION UNSPECIFIED   . Statins Other (See Comments)    Elevated LFTs  (Lipitor specifically)    Past Medical History:  Diagnosis Date  . Alcoholic (HCC)   . Anxiety   . Atrial flutter (HCC) 2013  . Cardiomyopathy (HCC)   . Colitis   . Depression   . High cholesterol   . Hypertension   . OCD (obsessive compulsive disorder)   . OSA (obstructive sleep apnea) 01/22/2013  . PTSD (post-traumatic stress disorder)   . PTSD (post-traumatic stress disorder)     Past Surgical History:  Procedure Laterality Date  . CARDIOVERSION N/A 09/15/2012   Procedure: CARDIOVERSION;  Surgeon: Chrystie Nose, MD;  Location: [facility] OR;  Service: Cardiovascular;  Laterality: N/A;    Family History: Family History  Problem Relation Age of Onset  . Healthy Mother   . Healthy Father   . Cancer Father        prostate cancer  . Heart attack Maternal Grandmother   . Heart attack Maternal Grandfather   . Heart attack Paternal Grandmother   . Sudden Cardiac Death Neg Hx     Social History:   reports that he has been smoking cigarettes. He has a 5.00 pack-year smoking history. He has never used smokeless tobacco. He reports previous alcohol use of about 12.0 standard drinks of alcohol per week. He reports that he does not use drugs.  Medications: No medications prior to admission.    No results found for this or any previous visit (from the past 48 hour(s)).  No results found.   A comprehensive review of systems was negative except for: Neurological: positive for paresthesia and weakness  There were no vitals  taken for this visit.  General appearance: alert, cooperative and appears stated age Head: Normocephalic, without obvious abnormality, atraumatic Neck: supple, symmetrical, trachea midline Cardio: regular rate and rhythm Resp: clear to auscultation bilaterally Extremities: Intact sensation and capillary refill all digits though fingers feel different in right hand.  +epl/fpl/io.  Weak finger crossing right hand. No wounds.  Pulses: 2+ and symmetric Skin: Skin color, texture, turgor normal. No rashes or lesions Neurologic: Grossly normal Incision/Wound: none  Assessment/Plan Right carpal tunnel syndrome and ulnar nerve compression at the wrist and elbow.  Non operative and operative treatment options have been discussed with the patient and patient wishes to proceed with operative treatment. Risks, benefits, and alternatives of surgery have been discussed and the patient agrees with the plan of care.   Betha Loa 04/16/2019, 9:47 AM

## 2019-04-16 NOTE — Op Note (Signed)
NAME: Malik Edwards MEDICAL RECORD NO: [id number] DATE OF BIRTH: May 14, 1975 FACILITY: [name] LOCATION: [facility] OR PHYSICIAN: Tami Ribas, MD   OPERATIVE REPORT   DATE OF PROCEDURE: 04/16/19    PREOPERATIVE DIAGNOSIS:   Right carpal tunnel syndrome, ulnar nerve compression at wrist, ulnar nerve compression at the elbow   POSTOPERATIVE DIAGNOSIS:  Right carpal tunnel syndrome, ulnar nerve compression at wrist, ulnar nerve compression at the elbow   PROCEDURE:   1.  Right carpal tunnel release 2.  Right ulnar nerve decompression at the wrist via same incision 3.  Right ulnar nerve decompression at the elbow via separate incision   SURGEON:  Betha Loa, M.D.   ASSISTANT: Cindee Salt, MD   ANESTHESIA:  General with regional   INTRAVENOUS FLUIDS:  Per anesthesia flow sheet.   ESTIMATED BLOOD LOSS:  Minimal.   COMPLICATIONS:  None.   SPECIMENS:  none   TOURNIQUET TIME:    Total Tourniquet Time Documented: Upper Arm (Right) - 76 minutes Total: Upper Arm (Right) - 76 minutes    DISPOSITION:  Stable to PACU.   INDICATIONS: 44 year old male with numbness in the right hand.  Positive nerve conduction studies for carpal tunnel syndrome, ulnar nerve compression at the wrist and elbow.  He wishes to undergo decompression with possible transposition at the elbow as necessary and carpal tunnel release and decompression of the ulnar nerve at the wrist. Risks, benefits and alternatives of surgery were discussed including the risks of blood loss, infection, damage to nerves, vessels, tendons, ligaments, bone for surgery, need for additional surgery, complications with wound healing, continued pain, stiffness.  He voiced understanding of these risks and elected to proceed.  OPERATIVE COURSE:  After being identified preoperatively by myself,  the patient and I agreed on the procedure and site of the procedure.  The surgical site was marked.  Surgical consent had been signed. He was given  IV antibiotics as preoperative antibiotic prophylaxis. He was transferred to the operating room and placed on the operating table in supine position with the Right Right upper extremity on an arm board.  General anesthesia was induced by the anesthesiologist. A regional block had been performed by anesthesia in preoperative holding.  Right upper extremity was prepped and draped in normal sterile orthopedic fashion.  A surgical pause was performed between the surgeons, anesthesia, and operating room staff and all were in agreement as to the patient, procedure, and site of procedure.  Tourniquet at the proximal aspect of the extremity was inflated to 250 mmHg after exsanguination of the arm with an Esmarch bandage.    Incision was made over the transverse carpal ligament and carried in subcutaneous tissues by spreading technique.  Bipolar electrocautery was used to obtain hemostasis.  The transverse carpal ligament was identified at its distal aspect.  The flexor tendon to the ring finger was identified and retracted radially.  The transverse carpal joint was then divided from distal to proximal.  The scissors were used to split the distal aspect of the volar antebrachial fascia.  The nerve was inspected.  It was adherent to the radial leaflet.  Motor branch was identified and was intact.  The ulnar nerve was then identified proximally.  The wound was extended proximally to aid in visualization.  The ulnar nerve was then carefully decompressed distally.  Care was taken to protect the ulnar nerve and ulnar artery throughout.  The motor branch of the ulnar nerve was identified and traced as it dove deep.  The fascia over top of the nerve was released.  Good decompression of the ulnar nerve and motor branch was achieved.  The wound was copiously irrigated with sterile saline and closed with 4-0 nylon in a horizontal mattress fashion.  An incision was then made at the medial side of the elbow.  This is carried into the  subcutaneous tissues by spreading technique.  Bipolar electrocautery was used to obtain hemostasis.  The ulnar nerve was identified proximal to Osborne's ligament.  The nerve was then decompressed proximally.  The fascia over the muscle was released and the investing fascia surrounding nerve was released.  A finger was placed into the wound to ensure complete decompression which was the case.  Osborne's ligament was then released.  The fascia over the FCU muscle was released and the FCU muscle spread.  The investing fascia over the nerve was then released.  Good decompression was obtained.  The elbow was flexed.  The nerve did not subluxate.  The volar leaflet of Osborne's ligament was then sutured to the posterior subcutaneous tissues.  There was no compression of the nerve.  The wound was copiously irrigated with sterile saline.  Inverted interrupted 4-0 Vicryl sutures were placed in subcutaneous tissues and skin was closed with 3-0 nylon in a horizontal mattress fashion.  The wounds were all dressed with sterile Xeroform 4 x 4's and ABD and wrapped with Kerlix and Ace bandage.  The tourniquet was deflated at 76 minutes.  Fingertips were pink with brisk capillary refill after deflation of tourniquet.  The operative  drapes were broken down.  The patient was awoken from anesthesia safely.  He was transferred back to the stretcher and taken to PACU in stable condition.  I will see him back in the office in 1 week for postoperative followup.  I will give him a prescription for Norco 5/325 1-2 tabs PO q6 hours prn pain, dispense # 20.  We had considered Celebrex but given his cardiac history I advised him to not take it at this time.   Leanora Cover, MD Electronically signed, 04/16/19

## 2019-04-16 NOTE — Discharge Instructions (Addendum)

## 2019-04-17 ENCOUNTER — Encounter: Payer: Self-pay | Admitting: *Deleted

## 2019-04-18 ENCOUNTER — Other Ambulatory Visit: Payer: Self-pay | Admitting: Internal Medicine

## 2019-04-18 DIAGNOSIS — I426 Alcoholic cardiomyopathy: Secondary | ICD-10-CM

## 2019-04-18 DIAGNOSIS — I10 Essential (primary) hypertension: Secondary | ICD-10-CM

## 2019-04-20 ENCOUNTER — Other Ambulatory Visit: Payer: Self-pay | Admitting: Internal Medicine

## 2019-04-20 DIAGNOSIS — F444 Conversion disorder with motor symptom or deficit: Secondary | ICD-10-CM | POA: Diagnosis not present

## 2019-04-20 DIAGNOSIS — I10 Essential (primary) hypertension: Secondary | ICD-10-CM

## 2019-04-20 DIAGNOSIS — F332 Major depressive disorder, recurrent severe without psychotic features: Secondary | ICD-10-CM | POA: Diagnosis not present

## 2019-04-20 DIAGNOSIS — I426 Alcoholic cardiomyopathy: Secondary | ICD-10-CM

## 2019-04-20 DIAGNOSIS — F410 Panic disorder [episodic paroxysmal anxiety] without agoraphobia: Secondary | ICD-10-CM | POA: Diagnosis not present

## 2019-04-20 DIAGNOSIS — F908 Attention-deficit hyperactivity disorder, other type: Secondary | ICD-10-CM | POA: Diagnosis not present

## 2019-04-20 DIAGNOSIS — F4310 Post-traumatic stress disorder, unspecified: Secondary | ICD-10-CM | POA: Diagnosis not present

## 2019-04-20 DIAGNOSIS — F4481 Dissociative identity disorder: Secondary | ICD-10-CM | POA: Diagnosis not present

## 2019-04-20 NOTE — Anesthesia Postprocedure Evaluation (Signed)
Anesthesia Post Note  Patient: Malik Edwards  Procedure(s) Performed: CARPAL TUNNEL RELEASE (Right Wrist) RIGHT ULNAR NERVE DECOMPRESSION AT WRIST, RIGHT ULNAR NERVE AT ELBOW (Right Arm Lower)     Patient location during evaluation: PACU Anesthesia Type: General and Regional Level of consciousness: awake and alert Pain management: pain level controlled Vital Signs Assessment: post-procedure vital signs reviewed and stable Respiratory status: spontaneous breathing, nonlabored ventilation, respiratory function stable and patient connected to nasal cannula oxygen Cardiovascular status: blood pressure returned to baseline and stable Postop Assessment: no apparent nausea or vomiting Anesthetic complications: no    Last Vitals:  Vitals:   04/16/19 1815 04/16/19 1830  BP: (!) 142/78 139/84  Pulse: 68 77  Resp: 15 16  Temp:  (!) 36.3 C  SpO2: 98% 100%    Last Pain:  Vitals:   04/16/19 1830  PainSc: 0-No pain   Pain Goal: Patients Stated Pain Goal: 0 (04/16/19 1339)                 Landis Cassaro

## 2019-04-27 DIAGNOSIS — F4481 Dissociative identity disorder: Secondary | ICD-10-CM | POA: Diagnosis not present

## 2019-04-27 DIAGNOSIS — F4310 Post-traumatic stress disorder, unspecified: Secondary | ICD-10-CM | POA: Diagnosis not present

## 2019-04-29 ENCOUNTER — Encounter: Payer: Self-pay | Admitting: Internal Medicine

## 2019-04-29 ENCOUNTER — Other Ambulatory Visit: Payer: Self-pay

## 2019-04-29 ENCOUNTER — Ambulatory Visit (INDEPENDENT_AMBULATORY_CARE_PROVIDER_SITE_OTHER): Payer: BC Managed Care – PPO | Admitting: Internal Medicine

## 2019-04-29 VITALS — BP 136/86 | HR 94 | Temp 98.1°F | Resp 16 | Ht 68.0 in | Wt 303.4 lb

## 2019-04-29 DIAGNOSIS — I4892 Unspecified atrial flutter: Secondary | ICD-10-CM | POA: Diagnosis not present

## 2019-04-29 DIAGNOSIS — I10 Essential (primary) hypertension: Secondary | ICD-10-CM

## 2019-04-29 DIAGNOSIS — I426 Alcoholic cardiomyopathy: Secondary | ICD-10-CM | POA: Diagnosis not present

## 2019-04-29 NOTE — Patient Instructions (Signed)

## 2019-04-29 NOTE — Progress Notes (Signed)
Subjective:  Patient ID: Malik Edwards, male    DOB: Feb 26, 1975  Age: 44 y.o. MRN: [id number]  CC: Hypertension  This visit occurred during the SARS-CoV-2 public health emergency.  Safety protocols were in place, including screening questions prior to the visit, additional usage of staff PPE, and extensive cleaning of exam room while observing appropriate contact time as indicated for disinfecting solutions.    HPI Malik Edwards presents for f/up - He tells me that his BP has been well controlled. He has felt well recently. He complains of weight gain.  Outpatient Medications Prior to Visit  Medication Sig Dispense Refill  . albuterol (VENTOLIN HFA) 108 (90 Base) MCG/ACT inhaler Inhale 2 puffs into the lungs every 4 (four) hours as needed for wheezing or shortness of breath (cough, shortness of breath or wheezing.). 18 g 3  . ARIPiprazole (ABILIFY) 2 MG tablet Take 2 mg by mouth daily.    Marland Kitchen aspirin EC 81 MG EC tablet Take 2 tablets (162 mg total) by mouth daily. For heart health (Patient taking differently: Take 162 mg by mouth daily. )    . Cholecalciferol (VITAMIN D) 50 MCG (2000 UT) tablet Take 2,000 Units by mouth daily.     . Multiple Vitamin (MULTIVITAMIN WITH MINERALS) TABS tablet Take 1 tablet by mouth daily.    . Vilazodone HCl (VIIBRYD) 40 MG TABS Take 40 mg by mouth daily.    . carvedilol (COREG) 25 MG tablet Take 1 tablet (25 mg total) by mouth 2 (two) times daily with a meal. (Patient taking differently: Take 50 mg by mouth 2 (two) times daily with a meal. ) 60 tablet 0  . indapamide (LOZOL) 1.25 MG tablet Take 1 tablet (1.25 mg total) by mouth daily. 30 tablet 0  . irbesartan (AVAPRO) 300 MG tablet Take 1 tablet (300 mg total) by mouth daily. 30 tablet 0  . HYDROcodone-acetaminophen (NORCO) 5-325 MG tablet 1-2 tabs po q6 hours prn pain 20 tablet 0   No facility-administered medications prior to visit.    ROS Review of Systems  Constitutional: Positive for  unexpected weight change (wt gain). Negative for diaphoresis and fatigue.  HENT: Negative.   Eyes: Negative for visual disturbance.  Respiratory: Negative for cough, shortness of breath and wheezing.   Cardiovascular: Negative for chest pain, palpitations and leg swelling.  Gastrointestinal: Negative for abdominal pain.  Endocrine: Negative.   Genitourinary: Negative.  Negative for difficulty urinating.  Musculoskeletal: Negative.  Negative for arthralgias and myalgias.  Skin: Negative.   Neurological: Negative for dizziness, weakness and light-headedness.  Hematological: Negative for adenopathy. Does not bruise/bleed easily.  Psychiatric/Behavioral: Negative.     Objective:  BP 136/86 (BP Location: Left Arm, Patient Position: Sitting, Cuff Size: Large)   Pulse 94   Temp 98.1 F (36.7 C) (Oral)   Resp 16   Ht 5\' 8"  (1.727 m)   Wt (!) 303 lb 6.4 oz (137.6 kg)   SpO2 96%   BMI 46.13 kg/m   BP Readings from Last 3 Encounters:  04/29/19 136/86  04/16/19 139/84  04/13/19 (!) 145/88    Wt Readings from Last 3 Encounters:  04/29/19 (!) 303 lb 6.4 oz (137.6 kg)  04/16/19 290 lb (131.5 kg)  04/13/19 298 lb (135.2 kg)    Physical Exam Vitals reviewed.  Constitutional:      Appearance: He is obese.  HENT:     Nose: Nose normal.     Mouth/Throat:     Mouth: Mucous membranes  are moist.  Eyes:     General: No scleral icterus.    Conjunctiva/sclera: Conjunctivae normal.  Cardiovascular:     Rate and Rhythm: Normal rate and regular rhythm.     Heart sounds: No murmur. No gallop.   Pulmonary:     Effort: Pulmonary effort is normal.     Breath sounds: No stridor. No wheezing, rhonchi or rales.  Abdominal:     General: Abdomen is protuberant. Bowel sounds are normal. There is no distension.     Palpations: Abdomen is soft. There is no hepatomegaly, splenomegaly or mass.  Musculoskeletal:        General: Normal range of motion.     Cervical back: Neck supple.     Right lower  leg: No edema.     Left lower leg: No edema.  Lymphadenopathy:     Cervical: No cervical adenopathy.  Skin:    General: Skin is warm and dry.  Neurological:     General: No focal deficit present.     Mental Status: He is alert and oriented to person, place, and time.     Lab Results  Component Value Date   WBC 7.3 04/13/2019   HGB 15.4 04/13/2019   HCT 45.6 04/13/2019   PLT 236 04/13/2019   GLUCOSE 101 (H) 04/13/2019   CHOL 256 (H) 06/12/2018   TRIG 60 06/12/2018   HDL 117 06/12/2018   LDLCALC 127 (H) 06/12/2018   ALT 25 07/01/2018   AST 22 07/01/2018   NA 139 04/13/2019   K 4.2 04/13/2019   CL 105 04/13/2019   CREATININE 0.85 04/13/2019   BUN 14 04/13/2019   CO2 25 04/13/2019   TSH 2.41 01/12/2019   PSA 0.34 07/01/2018   INR 0.99 12/20/2017   HGBA1C 5.1 06/12/2018    No results found.  Assessment & Plan:   Malik Edwards was seen today for hypertension.  Diagnoses and all orders for this visit:  Essential hypertension- His BP is well controlled. Recent lytes and renal function were normal. -     carvedilol (COREG) 25 MG tablet; Take 1 tablet (25 mg total) by mouth 2 (two) times daily with a meal. -     indapamide (LOZOL) 1.25 MG tablet; Take 1 tablet (1.25 mg total) by mouth daily. -     irbesartan (AVAPRO) 300 MG tablet; Take 1 tablet (300 mg total) by mouth daily.  Paroxysmal atrial flutter (HCC)- He has good rate/rhythm control. -     carvedilol (COREG) 25 MG tablet; Take 1 tablet (25 mg total) by mouth 2 (two) times daily with a meal.  Alcoholic cardiomyopathy (HCC)- He has a normal volume status. -     carvedilol (COREG) 25 MG tablet; Take 1 tablet (25 mg total) by mouth 2 (two) times daily with a meal. -     irbesartan (AVAPRO) 300 MG tablet; Take 1 tablet (300 mg total) by mouth daily.   I have discontinued Malik Edwards. Malik Edwards's HYDROcodone-acetaminophen. I am also having him maintain his aspirin, multivitamin with minerals, Vitamin D, Vilazodone HCl,  ARIPiprazole, albuterol, carvedilol, indapamide, and irbesartan.  Meds ordered this encounter  Medications  . carvedilol (COREG) 25 MG tablet    Sig: Take 1 tablet (25 mg total) by mouth 2 (two) times daily with a meal.    Dispense:  180 tablet    Refill:  1  . indapamide (LOZOL) 1.25 MG tablet    Sig: Take 1 tablet (1.25 mg total) by mouth daily.  Dispense:  90 tablet    Refill:  1  . irbesartan (AVAPRO) 300 MG tablet    Sig: Take 1 tablet (300 mg total) by mouth daily.    Dispense:  90 tablet    Refill:  1     Follow-up: Return in about 6 months (around 10/29/2019).  Scarlette Calico, MD

## 2019-04-30 MED ORDER — INDAPAMIDE 1.25 MG PO TABS: 1.2500 mg | ORAL_TABLET | Freq: Every day | ORAL | 1 refills | Status: DC

## 2019-04-30 MED ORDER — IRBESARTAN 300 MG PO TABS: 300.0000 mg | ORAL_TABLET | Freq: Every day | ORAL | 1 refills | Status: DC

## 2019-04-30 MED ORDER — CARVEDILOL 25 MG PO TABS: 25.0000 mg | ORAL_TABLET | Freq: Two times a day (BID) | ORAL | 1 refills | Status: DC

## 2019-05-04 DIAGNOSIS — F4481 Dissociative identity disorder: Secondary | ICD-10-CM | POA: Diagnosis not present

## 2019-05-04 DIAGNOSIS — F4310 Post-traumatic stress disorder, unspecified: Secondary | ICD-10-CM | POA: Diagnosis not present

## 2019-05-06 NOTE — Op Note (Signed)
I assisted Surgeon(s) and Role:    * Betha Loa, MD - Primary    * Cindee Salt, MD - Assisting on the Procedure(s): CARPAL TUNNEL RELEASE RIGHT ULNAR NERVE DECOMPRESSION AT WRIST, RIGHT ULNAR NERVE AT ELBOW on 04/16/2019.  I provided assistance on this case as follows: setup approach, decompression of the ulnar nerve at the elbow and median nerve at the wrist,closure of the wounds and application of the dressings.  Electronically signed by: Cindee Salt, MD Date: 05/06/2019 Time: 1:26 PM

## 2019-05-15 DIAGNOSIS — G5601 Carpal tunnel syndrome, right upper limb: Secondary | ICD-10-CM | POA: Diagnosis not present

## 2019-05-18 DIAGNOSIS — F4310 Post-traumatic stress disorder, unspecified: Secondary | ICD-10-CM | POA: Diagnosis not present

## 2019-05-18 DIAGNOSIS — F4481 Dissociative identity disorder: Secondary | ICD-10-CM | POA: Diagnosis not present

## 2019-05-25 DIAGNOSIS — F4481 Dissociative identity disorder: Secondary | ICD-10-CM | POA: Diagnosis not present

## 2019-05-25 DIAGNOSIS — F4310 Post-traumatic stress disorder, unspecified: Secondary | ICD-10-CM | POA: Diagnosis not present

## 2019-06-01 DIAGNOSIS — F4481 Dissociative identity disorder: Secondary | ICD-10-CM | POA: Diagnosis not present

## 2019-06-01 DIAGNOSIS — F4310 Post-traumatic stress disorder, unspecified: Secondary | ICD-10-CM | POA: Diagnosis not present

## 2019-06-08 DIAGNOSIS — F4310 Post-traumatic stress disorder, unspecified: Secondary | ICD-10-CM | POA: Diagnosis not present

## 2019-06-08 DIAGNOSIS — F4481 Dissociative identity disorder: Secondary | ICD-10-CM | POA: Diagnosis not present

## 2019-06-15 DIAGNOSIS — F4481 Dissociative identity disorder: Secondary | ICD-10-CM | POA: Diagnosis not present

## 2019-06-15 DIAGNOSIS — F4310 Post-traumatic stress disorder, unspecified: Secondary | ICD-10-CM | POA: Diagnosis not present

## 2019-06-22 DIAGNOSIS — F4481 Dissociative identity disorder: Secondary | ICD-10-CM | POA: Diagnosis not present

## 2019-06-22 DIAGNOSIS — F4310 Post-traumatic stress disorder, unspecified: Secondary | ICD-10-CM | POA: Diagnosis not present

## 2019-07-04 ENCOUNTER — Emergency Department (HOSPITAL_COMMUNITY)
Admission: EM | Admit: 2019-07-04 | Discharge: 2019-07-04 | Disposition: A | Discharge status: Home / Self Care | Payer: BC Managed Care – PPO | Attending: Emergency Medicine | Admitting: Emergency Medicine

## 2019-07-04 ENCOUNTER — Other Ambulatory Visit: Payer: Self-pay

## 2019-07-04 ENCOUNTER — Encounter (HOSPITAL_COMMUNITY): Payer: Self-pay | Admitting: Emergency Medicine

## 2019-07-04 DIAGNOSIS — Z0289 Encounter for other administrative examinations: Secondary | ICD-10-CM | POA: Diagnosis not present

## 2019-07-04 DIAGNOSIS — Z5321 Procedure and treatment not carried out due to patient leaving prior to being seen by health care provider: Secondary | ICD-10-CM | POA: Diagnosis not present

## 2019-07-04 NOTE — ED Notes (Signed)
Patient walked out of lobby stating he didn't want to be here, [facility] arrested patient and is taking him to jail.

## 2019-07-04 NOTE — ED Triage Notes (Signed)
Patient presents with [employer]. Patient is currently voluntary, but per [employer], patient's room mate is IVCing patient. [facility] was called out to a halfway house where, per room mates, patient was acting erratic and labile. Patient is possibly off of his medications and has been drinking tonight. Per [employer], patient did destroy property at the house. Patient noted to have a flat, depressed affect. Patient is not forthcoming with answers to questions. Patient just continually states, "I want to go home."

## 2019-07-06 DIAGNOSIS — F1021 Alcohol dependence, in remission: Secondary | ICD-10-CM | POA: Diagnosis not present

## 2019-07-06 DIAGNOSIS — F4310 Post-traumatic stress disorder, unspecified: Secondary | ICD-10-CM | POA: Diagnosis not present

## 2019-07-06 DIAGNOSIS — F4481 Dissociative identity disorder: Secondary | ICD-10-CM | POA: Diagnosis not present

## 2019-07-13 DIAGNOSIS — F4481 Dissociative identity disorder: Secondary | ICD-10-CM | POA: Diagnosis not present

## 2019-07-13 DIAGNOSIS — F4310 Post-traumatic stress disorder, unspecified: Secondary | ICD-10-CM | POA: Diagnosis not present

## 2019-07-23 ENCOUNTER — Encounter: Payer: Self-pay | Admitting: Internal Medicine

## 2019-07-23 ENCOUNTER — Other Ambulatory Visit: Payer: Self-pay

## 2019-07-23 ENCOUNTER — Ambulatory Visit (INDEPENDENT_AMBULATORY_CARE_PROVIDER_SITE_OTHER): Payer: BC Managed Care – PPO | Admitting: Internal Medicine

## 2019-07-23 VITALS — BP 134/88 | HR 80 | Temp 98.1°F | Resp 16 | Ht 68.0 in | Wt 303.6 lb

## 2019-07-23 DIAGNOSIS — R7989 Other specified abnormal findings of blood chemistry: Secondary | ICD-10-CM

## 2019-07-23 DIAGNOSIS — I10 Essential (primary) hypertension: Secondary | ICD-10-CM

## 2019-07-23 DIAGNOSIS — Z0000 Encounter for general adult medical examination without abnormal findings: Secondary | ICD-10-CM

## 2019-07-23 DIAGNOSIS — L98491 Non-pressure chronic ulcer of skin of other sites limited to breakdown of skin: Secondary | ICD-10-CM | POA: Diagnosis not present

## 2019-07-23 LAB — TSH: TSH: 0.97 u[IU]/mL (ref 0.35–4.50)

## 2019-07-23 LAB — CBC WITH DIFFERENTIAL/PLATELET
Basophils Absolute: 0.1 10*3/uL (ref 0.0–0.1)
Basophils Relative: 1.1 % (ref 0.0–3.0)
Eosinophils Absolute: 0.4 10*3/uL (ref 0.0–0.7)
Eosinophils Relative: 3.5 % (ref 0.0–5.0)
HCT: 42.6 % (ref 39.0–52.0)
Hemoglobin: 14.9 g/dL (ref 13.0–17.0)
Lymphocytes Relative: 22.5 % (ref 12.0–46.0)
Lymphs Abs: 2.4 10*3/uL (ref 0.7–4.0)
MCHC: 34.9 g/dL (ref 30.0–36.0)
MCV: 88.8 fl (ref 78.0–100.0)
Monocytes Absolute: 0.9 10*3/uL (ref 0.1–1.0)
Monocytes Relative: 8.5 % (ref 3.0–12.0)
Neutro Abs: 7 10*3/uL (ref 1.4–7.7)
Neutrophils Relative %: 64.4 % (ref 43.0–77.0)
Platelets: 274 10*3/uL (ref 150.0–400.0)
RBC: 4.79 Mil/uL (ref 4.22–5.81)
RDW: 13.3 % (ref 11.5–15.5)
WBC: 10.8 10*3/uL — ABNORMAL HIGH (ref 4.0–10.5)

## 2019-07-23 MED ORDER — SALICYLIC ACID 40 % EX PADS: 1.0000 | MEDICATED_PAD | Freq: Every day | CUTANEOUS | 3 refills | Status: DC | PRN

## 2019-07-23 NOTE — Patient Instructions (Signed)

## 2019-07-23 NOTE — Progress Notes (Signed)
Subjective:  Patient ID: Malik Edwards, male    DOB: 05-20-1975  Age: 44 y.o. MRN: [id number]  CC: Hypertension and Annual Exam  This visit occurred during the SARS-CoV-2 public health emergency.  Safety protocols were in place, including screening questions prior to the visit, additional usage of staff PPE, and extensive cleaning of exam room while observing appropriate contact time as indicated for disinfecting solutions.    HPI Malik Edwards presents for a CPX.  He complains of discomfort on the sides of both feet adjacent to the fifth toes.  He denies any trauma or injury.  He is very active and denies any recent episodes of chest pain, shortness of breath, palpitations, edema, or fatigue.  He tells me his blood pressure has been well controlled.  Outpatient Medications Prior to Visit  Medication Sig Dispense Refill  . albuterol (VENTOLIN HFA) 108 (90 Base) MCG/ACT inhaler Inhale 2 puffs into the lungs every 4 (four) hours as needed for wheezing or shortness of breath (cough, shortness of breath or wheezing.). 18 g 3  . ARIPiprazole (ABILIFY) 2 MG tablet Take 2 mg by mouth daily.    Marland Kitchen aspirin EC 81 MG EC tablet Take 2 tablets (162 mg total) by mouth daily. For heart health (Patient taking differently: Take 162 mg by mouth daily. )    . Cholecalciferol (VITAMIN D) 50 MCG (2000 UT) tablet Take 2,000 Units by mouth daily.     . indapamide (LOZOL) 1.25 MG tablet Take 1 tablet (1.25 mg total) by mouth daily. 90 tablet 1  . irbesartan (AVAPRO) 300 MG tablet Take 1 tablet (300 mg total) by mouth daily. 90 tablet 1  . Multiple Vitamin (MULTIVITAMIN WITH MINERALS) TABS tablet Take 1 tablet by mouth daily.    . Vilazodone HCl (VIIBRYD) 40 MG TABS Take 40 mg by mouth daily.    . carvedilol (COREG) 25 MG tablet Take 1 tablet (25 mg total) by mouth 2 (two) times daily with a meal. 180 tablet 1   No facility-administered medications prior to visit.    ROS Review of Systems    Constitutional: Positive for unexpected weight change (wt gain). Negative for appetite change, chills, diaphoresis and fatigue.  HENT: Negative.   Eyes: Negative.   Respiratory: Negative for cough, chest tightness, shortness of breath and wheezing.   Cardiovascular: Negative for chest pain, palpitations and leg swelling.  Gastrointestinal: Negative for abdominal pain, constipation, diarrhea, nausea and vomiting.  Endocrine: Negative.  Negative for cold intolerance and heat intolerance.  Genitourinary: Negative.  Negative for difficulty urinating.  Musculoskeletal: Negative for arthralgias and myalgias.  Skin: Negative.  Negative for color change and pallor.  Neurological: Negative.  Negative for dizziness, weakness, light-headedness, numbness and headaches.  Hematological: Negative for adenopathy. Does not bruise/bleed easily.  Psychiatric/Behavioral: Negative.  Negative for decreased concentration, dysphoric mood, self-injury, sleep disturbance and suicidal ideas. The patient is not nervous/anxious.     Objective:  BP 134/88   Pulse 80   Temp 98.1 F (36.7 C) (Oral)   Resp 16   Ht 5\' 8"  (1.727 m)   Wt (!) 303 lb 9.6 oz (137.7 kg)   SpO2 97%   BMI 46.16 kg/m   BP Readings from Last 3 Encounters:  07/23/19 134/88  04/29/19 136/86  04/16/19 139/84    Wt Readings from Last 3 Encounters:  07/23/19 (!) 303 lb 9.6 oz (137.7 kg)  04/29/19 (!) 303 lb 6.4 oz (137.6 kg)  04/16/19 290 lb (131.5 kg)  Physical Exam Vitals reviewed.  Constitutional:      Appearance: He is obese.  HENT:     Nose: Nose normal.     Mouth/Throat:     Mouth: Mucous membranes are moist.  Eyes:     General: No scleral icterus.    Conjunctiva/sclera: Conjunctivae normal.  Cardiovascular:     Rate and Rhythm: Normal rate and regular rhythm.     Pulses:          Dorsalis pedis pulses are 1+ on the right side and 1+ on the left side.       Posterior tibial pulses are 1+ on the right side and 1+ on  the left side.     Heart sounds: No murmur heard.   Pulmonary:     Breath sounds: No stridor. No wheezing, rhonchi or rales.  Abdominal:     General: Abdomen is protuberant. There is no distension.     Palpations: Abdomen is soft. There is no hepatomegaly, splenomegaly or mass.     Tenderness: There is no abdominal tenderness. There is no guarding.     Hernia: No hernia is present.  Musculoskeletal:        General: Normal range of motion.     Cervical back: Neck supple.     Right lower leg: Edema (trace pitting) present.     Left lower leg: Edema (trace pitting) present.     Right foot: Normal range of motion. No deformity or bunion.     Left foot: Normal range of motion. No deformity or bunion.  Feet:     Right foot:     Skin integrity: Erythema and callus present. No ulcer, blister, skin breakdown, warmth or dry skin.     Toenail Condition: Right toenails are normal.     Left foot:     Skin integrity: Erythema and callus present. No ulcer, blister, skin breakdown or dry skin.     Toenail Condition: Left toenails are normal.     Comments: Callus with erythema but no breakdown over the lateral and plantar sides of the 5th MTP joints Lymphadenopathy:     Cervical: No cervical adenopathy.  Skin:    General: Skin is warm and dry.     Coloration: Skin is not pale.  Neurological:     General: No focal deficit present.     Mental Status: He is alert and oriented to person, place, and time. Mental status is at baseline.  Psychiatric:        Mood and Affect: Mood normal.        Behavior: Behavior normal.     Lab Results  Component Value Date   WBC 10.8 (H) 07/23/2019   HGB 14.9 07/23/2019   HCT 42.6 07/23/2019   PLT 274.0 07/23/2019   GLUCOSE 98 07/23/2019   CHOL 167 07/23/2019   TRIG 144.0 07/23/2019   HDL 36.70 (L) 07/23/2019   LDLCALC 101 (H) 07/23/2019   ALT 20 07/23/2019   AST 18 07/23/2019   NA 139 07/23/2019   K 4.1 07/23/2019   CL 104 07/23/2019   CREATININE  0.82 07/23/2019   BUN 21 07/23/2019   CO2 25 07/23/2019   TSH 0.97 07/23/2019   PSA 0.34 07/01/2018   INR 0.99 12/20/2017   HGBA1C 5.1 06/12/2018    No results found.  Assessment & Plan:   Yancy was seen today for hypertension and annual exam.  Diagnoses and all orders for this visit:  Essential hypertension- His blood pressure  is well controlled.  Electrolytes and renal function are normal. -     Basic metabolic panel; Future -     Hepatic function panel; Future -     CBC with Differential/Platelet; Future -     TSH; Future -     TSH -     CBC with Differential/Platelet -     Hepatic function panel -     Basic metabolic panel  Hypomagnesemia- His magnesium level is normal now. -     Magnesium; Future -     Magnesium  Routine general medical examination at a health care facility- Exam completed, labs reviewed, vaccines reviewed and updated, patient education was given. -     Lipid panel; Future -     Lipid panel  Callous ulcer, limited to breakdown of skin (HCC) -     Salicylic Acid 40 % PADS; Apply 1 Act topically daily as needed. -     Ambulatory referral to Podiatry  TSH - His TSH is normal now and he clinically appears euthyroid. -     TSH; Future -     TSH   I am having Gaetana Michaelis start on Salicylic Acid. I am also having him maintain his aspirin, multivitamin with minerals, Vitamin D, Vilazodone HCl, ARIPiprazole, albuterol, indapamide, and irbesartan.  Meds ordered this encounter  Medications  . Salicylic Acid 40 % PADS    Sig: Apply 1 Act topically daily as needed.    Dispense:  48 each    Refill:  3     Follow-up: Return in about 6 months (around 01/22/2020).  Scarlette Calico, MD

## 2019-07-24 LAB — BASIC METABOLIC PANEL
BUN: 21 mg/dL (ref 6–23)
CO2: 25 mEq/L (ref 19–32)
Calcium: 9.4 mg/dL (ref 8.4–10.5)
Chloride: 104 mEq/L (ref 96–112)
Creatinine, Ser: 0.82 mg/dL (ref 0.40–1.50)
GFR: 101.41 mL/min (ref >60.00)
Glucose, Bld: 98 mg/dL (ref 70–99)
Potassium: 4.1 mEq/L (ref 3.5–5.1)
Sodium: 139 mEq/L (ref 135–145)

## 2019-07-24 LAB — LIPID PANEL
Cholesterol: 167 mg/dL (ref 0–200)
HDL: 36.7 mg/dL — ABNORMAL LOW (ref >39.00)
LDL Cholesterol: 101 mg/dL — ABNORMAL HIGH (ref 0–99)
NonHDL: 130.15
Total CHOL/HDL Ratio: 5
Triglycerides: 144 mg/dL (ref 0.0–149.0)
VLDL: 28.8 mg/dL (ref 0.0–40.0)

## 2019-07-24 LAB — MAGNESIUM: Magnesium: 2.1 mg/dL (ref 1.5–2.5)

## 2019-07-24 LAB — HEPATIC FUNCTION PANEL
ALT: 20 U/L (ref 0–53)
AST: 18 U/L (ref 0–37)
Albumin: 4.3 g/dL (ref 3.5–5.2)
Alkaline Phosphatase: 70 U/L (ref 39–117)
Bilirubin, Direct: 0 mg/dL (ref 0.0–0.3)
Total Bilirubin: 0.3 mg/dL (ref 0.2–1.2)
Total Protein: 7.2 g/dL (ref 6.0–8.3)

## 2019-07-25 ENCOUNTER — Other Ambulatory Visit: Payer: Self-pay | Admitting: Internal Medicine

## 2019-07-25 DIAGNOSIS — I10 Essential (primary) hypertension: Secondary | ICD-10-CM

## 2019-07-25 DIAGNOSIS — F4481 Dissociative identity disorder: Secondary | ICD-10-CM | POA: Diagnosis not present

## 2019-07-25 DIAGNOSIS — I4892 Unspecified atrial flutter: Secondary | ICD-10-CM

## 2019-07-25 DIAGNOSIS — F4310 Post-traumatic stress disorder, unspecified: Secondary | ICD-10-CM | POA: Diagnosis not present

## 2019-07-25 DIAGNOSIS — I426 Alcoholic cardiomyopathy: Secondary | ICD-10-CM

## 2019-07-27 DIAGNOSIS — F4310 Post-traumatic stress disorder, unspecified: Secondary | ICD-10-CM | POA: Diagnosis not present

## 2019-07-27 DIAGNOSIS — F4481 Dissociative identity disorder: Secondary | ICD-10-CM | POA: Diagnosis not present

## 2019-07-29 ENCOUNTER — Ambulatory Visit (INDEPENDENT_AMBULATORY_CARE_PROVIDER_SITE_OTHER): Payer: BC Managed Care – PPO | Admitting: Podiatry

## 2019-07-29 ENCOUNTER — Other Ambulatory Visit: Payer: Self-pay

## 2019-07-29 DIAGNOSIS — Q666 Other congenital valgus deformities of feet: Secondary | ICD-10-CM | POA: Diagnosis not present

## 2019-07-29 DIAGNOSIS — M7751 Other enthesopathy of right foot: Secondary | ICD-10-CM | POA: Diagnosis not present

## 2019-07-29 DIAGNOSIS — M7752 Other enthesopathy of left foot: Secondary | ICD-10-CM

## 2019-07-30 ENCOUNTER — Encounter: Payer: Self-pay | Admitting: Podiatry

## 2019-07-30 NOTE — Progress Notes (Signed)
Subjective:  Patient ID: Malik Edwards, male    DOB: [birth year],  MRN: [id number]  Chief Complaint  Patient presents with  . Callouses    pt is here for a bil callus trim. Pt states it has been going on for multiple years, pt also states that he is looking to have his foot looked at.    44 y.o. male presents with the above complaint.  Patient presents with complaint of bilateral hyperkeratotic lesion submetatarsal 5 that has been going for multiple years has progressively gotten worse.  Patient has tried trimming it down with a razor blade which helps a little bit but the pain comes right back.  Patient states that he would like to have it debrided down he would like to discuss various long-term treatment options.  He states that there is a lot of pain in the joint as well.  He would like to know what the long-term management is.  If he needs to get orthotics to help offload this.  He denies any other acute complaints.  He denies seeing anyone else prior to seeing me.   Review of Systems: Negative except as noted in the HPI. Denies N/V/F/Ch.  Past Medical History:  Diagnosis Date  . Alcoholic (HCC)   . Anxiety   . Atrial flutter (HCC) 2013  . Cardiomyopathy (HCC)   . Colitis   . Depression   . High cholesterol   . Hypertension   . OCD (obsessive compulsive disorder)   . OSA (obstructive sleep apnea) 01/22/2013  . PTSD (post-traumatic stress disorder)   . PTSD (post-traumatic stress disorder)     Current Outpatient Medications:  .  albuterol (VENTOLIN HFA) 108 (90 Base) MCG/ACT inhaler, Inhale 2 puffs into the lungs every 4 (four) hours as needed for wheezing or shortness of breath (cough, shortness of breath or wheezing.)., Disp: 18 g, Rfl: 3 .  ARIPiprazole (ABILIFY) 2 MG tablet, Take 2 mg by mouth daily., Disp: , Rfl:  .  aspirin EC 81 MG EC tablet, Take 2 tablets (162 mg total) by mouth daily. For heart health (Patient taking differently: Take 162 mg by mouth daily. ), Disp:  , Rfl:  .  carvedilol (COREG) 25 MG tablet, TAKE 2 TABLETS BY MOUTH 2 TIMES DAILY WITH A MEAL, Disp: 360 tablet, Rfl: 1 .  Cholecalciferol (VITAMIN D) 50 MCG (2000 UT) tablet, Take 2,000 Units by mouth daily. , Disp: , Rfl:  .  indapamide (LOZOL) 1.25 MG tablet, Take 1 tablet (1.25 mg total) by mouth daily., Disp: 90 tablet, Rfl: 1 .  irbesartan (AVAPRO) 300 MG tablet, Take 1 tablet (300 mg total) by mouth daily., Disp: 90 tablet, Rfl: 1 .  Multiple Vitamin (MULTIVITAMIN WITH MINERALS) TABS tablet, Take 1 tablet by mouth daily., Disp: , Rfl:  .  naltrexone (DEPADE) 50 MG tablet, Take 50 mg by mouth daily., Disp: , Rfl:  .  REXULTI 2 MG TABS tablet, Take 2 mg by mouth daily., Disp: , Rfl:  .  Salicylic Acid 40 % PADS, Apply 1 Act topically daily as needed., Disp: 48 each, Rfl: 3 .  Vilazodone HCl (VIIBRYD) 40 MG TABS, Take 40 mg by mouth daily., Disp: , Rfl:   Social History   Tobacco Use  Smoking Status Current Every Day Smoker  . Packs/day: 0.25  . Years: 20.00  . Pack years: 5.00  . Types: Cigarettes  Smokeless Tobacco Never Used    Allergies  Allergen Reactions  . Gabapentin Swelling  SWELLING REACTION UNSPECIFIED   . Statins Other (See Comments)    Elevated LFTs  (Lipitor specifically)   Objective:  There were no vitals filed for this visit. There is no height or weight on file to calculate BMI. Constitutional Well developed. Well nourished.  Vascular Dorsalis pedis pulses palpable bilaterally. Posterior tibial pulses palpable bilaterally. Capillary refill normal to all digits.  No cyanosis or clubbing noted. Pedal hair growth normal.  Neurologic Normal speech. Oriented to person, place, and time. Epicritic sensation to light touch grossly present bilaterally.  Dermatologic  bilateral submetatarsal 5 hyperkeratotic lesion.  Upon debridement no pinpoint bleeding noted.  No underlying ulceration noted.  Pain with range of motion of the fifth metatarsophalangeal joint.   Mild intra-articular pain noted.  These findings were consistent bilaterally Gait examination was semiflexible pes planus deformity with calcaneal eversion in stance position mildly too many toe signs and partially able to recruit the arch with dorsiflexion of the hallux  Orthopedic: Normal joint ROM without pain or crepitus bilaterally. No visible deformities. No bony tenderness.   Radiographs: None Assessment:   1. Pes planovalgus   2. Capsulitis of metatarsophalangeal (MTP) joint of right foot   3. Capsulitis of metatarsophalangeal (MTP) joint of left foot    Plan:  Patient was evaluated and treated and all questions answered.  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  Ultimately this has to do with the underlying architecture of the foot and the excessive pressure he is putting on the submetatarsal 5 when ambulating in stance position.  I believe patient will benefit from a steroid injection to decrease the capsulitis portion of fifth metatarsophalangeal joint and therefore the pain associated with it.  Patient agrees with the plan would like to proceed with injection -A steroid injection was performed at bilateral fifth metatarsophalangeal joint using 1% plain Lidocaine and 10 mg of Kenalog. This was well tolerated.  Bilateral pes planovalgus semi-flexible -I explained patient the etiology of pes planovalgus deformity and various treatment options were extensively discussed with the patient.  I believe patient will benefit from a custom made orthotics to control this and support the hindfoot motion as well as the arch of the foot.  I believe he will also benefit from offloading bilateral submetatarsal 5 lesions. -I will have him scheduled with [facility] for custom-made orthotics  No follow-ups on file.

## 2019-08-03 DIAGNOSIS — F4481 Dissociative identity disorder: Secondary | ICD-10-CM | POA: Diagnosis not present

## 2019-08-03 DIAGNOSIS — F4310 Post-traumatic stress disorder, unspecified: Secondary | ICD-10-CM | POA: Diagnosis not present

## 2019-08-11 ENCOUNTER — Ambulatory Visit (INDEPENDENT_AMBULATORY_CARE_PROVIDER_SITE_OTHER): Payer: BC Managed Care – PPO | Admitting: Orthotics

## 2019-08-11 ENCOUNTER — Other Ambulatory Visit: Payer: Self-pay

## 2019-08-11 DIAGNOSIS — Q666 Other congenital valgus deformities of feet: Secondary | ICD-10-CM

## 2019-08-11 DIAGNOSIS — M7751 Other enthesopathy of right foot: Secondary | ICD-10-CM

## 2019-08-11 DIAGNOSIS — M7752 Other enthesopathy of left foot: Secondary | ICD-10-CM

## 2019-08-11 NOTE — Progress Notes (Signed)
Cast today to address pes planovalgus and 5th met callus; plan on graphite device to support arch, lot of cushion due to wt (300); offload 5th met heads b/l.

## 2019-08-20 DIAGNOSIS — F4310 Post-traumatic stress disorder, unspecified: Secondary | ICD-10-CM | POA: Diagnosis not present

## 2019-08-20 DIAGNOSIS — F4481 Dissociative identity disorder: Secondary | ICD-10-CM | POA: Diagnosis not present

## 2019-08-20 DIAGNOSIS — F1021 Alcohol dependence, in remission: Secondary | ICD-10-CM | POA: Diagnosis not present

## 2019-08-24 DIAGNOSIS — F4481 Dissociative identity disorder: Secondary | ICD-10-CM | POA: Diagnosis not present

## 2019-08-24 DIAGNOSIS — F4310 Post-traumatic stress disorder, unspecified: Secondary | ICD-10-CM | POA: Diagnosis not present

## 2019-08-24 DIAGNOSIS — F1021 Alcohol dependence, in remission: Secondary | ICD-10-CM | POA: Diagnosis not present

## 2019-08-31 DIAGNOSIS — F4310 Post-traumatic stress disorder, unspecified: Secondary | ICD-10-CM | POA: Diagnosis not present

## 2019-08-31 DIAGNOSIS — F4481 Dissociative identity disorder: Secondary | ICD-10-CM | POA: Diagnosis not present

## 2019-08-31 DIAGNOSIS — F1021 Alcohol dependence, in remission: Secondary | ICD-10-CM | POA: Diagnosis not present

## 2019-09-01 ENCOUNTER — Ambulatory Visit: Payer: BC Managed Care – PPO | Admitting: Orthotics

## 2019-09-01 ENCOUNTER — Other Ambulatory Visit: Payer: Self-pay

## 2019-09-01 DIAGNOSIS — Q666 Other congenital valgus deformities of feet: Secondary | ICD-10-CM

## 2019-09-01 DIAGNOSIS — L98491 Non-pressure chronic ulcer of skin of other sites limited to breakdown of skin: Secondary | ICD-10-CM

## 2019-09-01 DIAGNOSIS — M7752 Other enthesopathy of left foot: Secondary | ICD-10-CM

## 2019-09-01 DIAGNOSIS — M7751 Other enthesopathy of right foot: Secondary | ICD-10-CM

## 2019-09-01 NOTE — Progress Notes (Signed)
Patient came in today to pick up custom made foot orthotics.  The goals were accomplished and the patient reported no dissatisfaction with said orthotics.  Patient was advised of breakin period and how to report any issues. 

## 2019-09-14 DIAGNOSIS — F4310 Post-traumatic stress disorder, unspecified: Secondary | ICD-10-CM | POA: Diagnosis not present

## 2019-09-14 DIAGNOSIS — F4481 Dissociative identity disorder: Secondary | ICD-10-CM | POA: Diagnosis not present

## 2019-09-21 DIAGNOSIS — F4310 Post-traumatic stress disorder, unspecified: Secondary | ICD-10-CM | POA: Diagnosis not present

## 2019-09-21 DIAGNOSIS — F4481 Dissociative identity disorder: Secondary | ICD-10-CM | POA: Diagnosis not present

## 2019-09-28 DIAGNOSIS — F4310 Post-traumatic stress disorder, unspecified: Secondary | ICD-10-CM | POA: Diagnosis not present

## 2019-09-28 DIAGNOSIS — F4481 Dissociative identity disorder: Secondary | ICD-10-CM | POA: Diagnosis not present

## 2019-10-06 DIAGNOSIS — F4310 Post-traumatic stress disorder, unspecified: Secondary | ICD-10-CM | POA: Diagnosis not present

## 2019-10-06 DIAGNOSIS — F4481 Dissociative identity disorder: Secondary | ICD-10-CM | POA: Diagnosis not present

## 2019-10-12 DIAGNOSIS — F4310 Post-traumatic stress disorder, unspecified: Secondary | ICD-10-CM | POA: Diagnosis not present

## 2019-10-12 DIAGNOSIS — F4481 Dissociative identity disorder: Secondary | ICD-10-CM | POA: Diagnosis not present

## 2019-10-19 DIAGNOSIS — F4481 Dissociative identity disorder: Secondary | ICD-10-CM | POA: Diagnosis not present

## 2019-10-19 DIAGNOSIS — F4310 Post-traumatic stress disorder, unspecified: Secondary | ICD-10-CM | POA: Diagnosis not present

## 2019-10-26 DIAGNOSIS — F4310 Post-traumatic stress disorder, unspecified: Secondary | ICD-10-CM | POA: Diagnosis not present

## 2019-10-26 DIAGNOSIS — F4481 Dissociative identity disorder: Secondary | ICD-10-CM | POA: Diagnosis not present

## 2019-10-31 ENCOUNTER — Other Ambulatory Visit: Payer: Self-pay | Admitting: Internal Medicine

## 2019-10-31 DIAGNOSIS — I10 Essential (primary) hypertension: Secondary | ICD-10-CM

## 2019-10-31 DIAGNOSIS — I426 Alcoholic cardiomyopathy: Secondary | ICD-10-CM

## 2019-11-02 DIAGNOSIS — F4310 Post-traumatic stress disorder, unspecified: Secondary | ICD-10-CM | POA: Diagnosis not present

## 2019-11-02 DIAGNOSIS — F4481 Dissociative identity disorder: Secondary | ICD-10-CM | POA: Diagnosis not present

## 2019-11-09 DIAGNOSIS — F4310 Post-traumatic stress disorder, unspecified: Secondary | ICD-10-CM | POA: Diagnosis not present

## 2019-11-09 DIAGNOSIS — F4481 Dissociative identity disorder: Secondary | ICD-10-CM | POA: Diagnosis not present

## 2019-11-12 DIAGNOSIS — F1020 Alcohol dependence, uncomplicated: Secondary | ICD-10-CM | POA: Diagnosis not present

## 2019-11-16 DIAGNOSIS — F4310 Post-traumatic stress disorder, unspecified: Secondary | ICD-10-CM | POA: Diagnosis not present

## 2019-11-16 DIAGNOSIS — F4481 Dissociative identity disorder: Secondary | ICD-10-CM | POA: Diagnosis not present

## 2019-11-23 DIAGNOSIS — F4481 Dissociative identity disorder: Secondary | ICD-10-CM | POA: Diagnosis not present

## 2019-11-23 DIAGNOSIS — F4310 Post-traumatic stress disorder, unspecified: Secondary | ICD-10-CM | POA: Diagnosis not present

## 2019-11-26 DIAGNOSIS — F1020 Alcohol dependence, uncomplicated: Secondary | ICD-10-CM | POA: Diagnosis not present

## 2019-12-03 DIAGNOSIS — F1020 Alcohol dependence, uncomplicated: Secondary | ICD-10-CM | POA: Diagnosis not present

## 2019-12-07 DIAGNOSIS — F4310 Post-traumatic stress disorder, unspecified: Secondary | ICD-10-CM | POA: Diagnosis not present

## 2019-12-07 DIAGNOSIS — F4481 Dissociative identity disorder: Secondary | ICD-10-CM | POA: Diagnosis not present

## 2019-12-14 DIAGNOSIS — F4310 Post-traumatic stress disorder, unspecified: Secondary | ICD-10-CM | POA: Diagnosis not present

## 2019-12-14 DIAGNOSIS — F4481 Dissociative identity disorder: Secondary | ICD-10-CM | POA: Diagnosis not present

## 2019-12-17 DIAGNOSIS — F1020 Alcohol dependence, uncomplicated: Secondary | ICD-10-CM | POA: Diagnosis not present

## 2019-12-21 DIAGNOSIS — F4481 Dissociative identity disorder: Secondary | ICD-10-CM | POA: Diagnosis not present

## 2019-12-21 DIAGNOSIS — F4312 Post-traumatic stress disorder, chronic: Secondary | ICD-10-CM | POA: Diagnosis not present

## 2019-12-21 DIAGNOSIS — F1020 Alcohol dependence, uncomplicated: Secondary | ICD-10-CM | POA: Diagnosis not present

## 2019-12-21 DIAGNOSIS — F4310 Post-traumatic stress disorder, unspecified: Secondary | ICD-10-CM | POA: Diagnosis not present

## 2019-12-23 DIAGNOSIS — F1020 Alcohol dependence, uncomplicated: Secondary | ICD-10-CM | POA: Diagnosis not present

## 2019-12-28 DIAGNOSIS — F4481 Dissociative identity disorder: Secondary | ICD-10-CM | POA: Diagnosis not present

## 2019-12-28 DIAGNOSIS — F4310 Post-traumatic stress disorder, unspecified: Secondary | ICD-10-CM | POA: Diagnosis not present

## 2019-12-31 DIAGNOSIS — F1020 Alcohol dependence, uncomplicated: Secondary | ICD-10-CM | POA: Diagnosis not present

## 2020-01-04 DIAGNOSIS — F4481 Dissociative identity disorder: Secondary | ICD-10-CM | POA: Diagnosis not present

## 2020-01-04 DIAGNOSIS — F4310 Post-traumatic stress disorder, unspecified: Secondary | ICD-10-CM | POA: Diagnosis not present

## 2020-01-11 DIAGNOSIS — F4310 Post-traumatic stress disorder, unspecified: Secondary | ICD-10-CM | POA: Diagnosis not present

## 2020-01-11 DIAGNOSIS — F4481 Dissociative identity disorder: Secondary | ICD-10-CM | POA: Diagnosis not present

## 2020-10-10 DIAGNOSIS — F4481 Dissociative identity disorder: Secondary | ICD-10-CM | POA: Diagnosis not present

## 2020-10-10 DIAGNOSIS — F4310 Post-traumatic stress disorder, unspecified: Secondary | ICD-10-CM | POA: Diagnosis not present

## 2020-10-17 DIAGNOSIS — F4481 Dissociative identity disorder: Secondary | ICD-10-CM | POA: Diagnosis not present

## 2020-10-17 DIAGNOSIS — F4310 Post-traumatic stress disorder, unspecified: Secondary | ICD-10-CM | POA: Diagnosis not present

## 2020-10-18 DIAGNOSIS — F4310 Post-traumatic stress disorder, unspecified: Secondary | ICD-10-CM | POA: Diagnosis not present

## 2020-10-18 DIAGNOSIS — F4481 Dissociative identity disorder: Secondary | ICD-10-CM | POA: Diagnosis not present

## 2020-10-21 ENCOUNTER — Encounter (HOSPITAL_COMMUNITY): Payer: Self-pay | Admitting: Emergency Medicine

## 2020-10-21 ENCOUNTER — Emergency Department (HOSPITAL_COMMUNITY)
Admission: EM | Admit: 2020-10-21 | Discharge: 2020-10-21 | Disposition: A | Discharge status: Home / Self Care | Payer: PPO | Attending: Emergency Medicine | Admitting: Emergency Medicine

## 2020-10-21 ENCOUNTER — Other Ambulatory Visit: Payer: Self-pay

## 2020-10-21 DIAGNOSIS — Z79899 Other long term (current) drug therapy: Secondary | ICD-10-CM | POA: Insufficient documentation

## 2020-10-21 DIAGNOSIS — F4310 Post-traumatic stress disorder, unspecified: Secondary | ICD-10-CM | POA: Diagnosis not present

## 2020-10-21 DIAGNOSIS — Y907 Blood alcohol level of 200-239 mg/100 ml: Secondary | ICD-10-CM | POA: Diagnosis not present

## 2020-10-21 DIAGNOSIS — I10 Essential (primary) hypertension: Secondary | ICD-10-CM | POA: Diagnosis not present

## 2020-10-21 DIAGNOSIS — F10959 Alcohol use, unspecified with alcohol-induced psychotic disorder, unspecified: Secondary | ICD-10-CM

## 2020-10-21 DIAGNOSIS — Z7982 Long term (current) use of aspirin: Secondary | ICD-10-CM | POA: Diagnosis not present

## 2020-10-21 DIAGNOSIS — R45851 Suicidal ideations: Secondary | ICD-10-CM

## 2020-10-21 DIAGNOSIS — Z046 Encounter for general psychiatric examination, requested by authority: Secondary | ICD-10-CM | POA: Diagnosis present

## 2020-10-21 DIAGNOSIS — F17210 Nicotine dependence, cigarettes, uncomplicated: Secondary | ICD-10-CM | POA: Insufficient documentation

## 2020-10-21 DIAGNOSIS — F1024 Alcohol dependence with alcohol-induced mood disorder: Secondary | ICD-10-CM | POA: Diagnosis not present

## 2020-10-21 DIAGNOSIS — F1020 Alcohol dependence, uncomplicated: Secondary | ICD-10-CM | POA: Diagnosis present

## 2020-10-21 DIAGNOSIS — F1094 Alcohol use, unspecified with alcohol-induced mood disorder: Secondary | ICD-10-CM

## 2020-10-21 DIAGNOSIS — F1010 Alcohol abuse, uncomplicated: Secondary | ICD-10-CM | POA: Diagnosis not present

## 2020-10-21 DIAGNOSIS — F10920 Alcohol use, unspecified with intoxication, uncomplicated: Secondary | ICD-10-CM | POA: Diagnosis not present

## 2020-10-21 LAB — BASIC METABOLIC PANEL
Anion gap: 13 (ref 5–15)
BUN: 21 mg/dL — ABNORMAL HIGH (ref 6–20)
CO2: 24 mmol/L (ref 22–32)
Calcium: 8.3 mg/dL — ABNORMAL LOW (ref 8.9–10.3)
Chloride: 102 mmol/L (ref 98–111)
Creatinine, Ser: 0.96 mg/dL (ref 0.61–1.24)
GFR, Estimated: >60 mL/min (ref >60)
Glucose, Bld: 119 mg/dL — ABNORMAL HIGH (ref 70–99)
Potassium: 4.3 mmol/L (ref 3.5–5.1)
Sodium: 139 mmol/L (ref 135–145)

## 2020-10-21 LAB — RAPID URINE DRUG SCREEN, HOSP PERFORMED
Amphetamines: NOT DETECTED
Barbiturates: NOT DETECTED
Benzodiazepines: NOT DETECTED
Cocaine: NOT DETECTED
Opiates: NOT DETECTED
Tetrahydrocannabinol: NOT DETECTED

## 2020-10-21 LAB — CBC WITH DIFFERENTIAL/PLATELET
Abs Immature Granulocytes: 0.05 10*3/uL (ref 0.00–0.07)
Basophils Absolute: 0.1 10*3/uL (ref 0.0–0.1)
Basophils Relative: 1 %
Eosinophils Absolute: 0.1 10*3/uL (ref 0.0–0.5)
Eosinophils Relative: 1 %
HCT: 50.3 % (ref 39.0–52.0)
Hemoglobin: 17.5 g/dL — ABNORMAL HIGH (ref 13.0–17.0)
Immature Granulocytes: 0 %
Lymphocytes Relative: 20 %
Lymphs Abs: 3.1 10*3/uL (ref 0.7–4.0)
MCH: 31.4 pg (ref 26.0–34.0)
MCHC: 34.8 g/dL (ref 30.0–36.0)
MCV: 90.3 fL (ref 80.0–100.0)
Monocytes Absolute: 0.7 10*3/uL (ref 0.1–1.0)
Monocytes Relative: 4 %
Neutro Abs: 11.9 10*3/uL — ABNORMAL HIGH (ref 1.7–7.7)
Neutrophils Relative %: 74 %
Platelets: 252 10*3/uL (ref 150–400)
RBC: 5.57 MIL/uL (ref 4.22–5.81)
RDW: 13.4 % (ref 11.5–15.5)
WBC: 16 10*3/uL — ABNORMAL HIGH (ref 4.0–10.5)
nRBC: 0 % (ref 0.0–0.2)

## 2020-10-21 LAB — ETHANOL: Alcohol, Ethyl (B): 233 mg/dL — ABNORMAL HIGH (ref <10)

## 2020-10-21 MED ORDER — LORAZEPAM 1 MG PO TABS: 0.0000 mg | ORAL_TABLET | Freq: Two times a day (BID) | ORAL | Status: DC

## 2020-10-21 MED ORDER — LORAZEPAM 1 MG PO TABS
0.0000 mg | ORAL_TABLET | Freq: Four times a day (QID) | ORAL | Status: DC
  Administered 2020-10-21: 1 mg via ORAL
  Filled 2020-10-21: qty 1

## 2020-10-21 MED ORDER — LORAZEPAM 2 MG/ML IJ SOLN: 0.0000 mg | Freq: Two times a day (BID) | INTRAMUSCULAR | Status: DC

## 2020-10-21 MED ORDER — LORAZEPAM 2 MG/ML IJ SOLN: 0.0000 mg | Freq: Four times a day (QID) | INTRAMUSCULAR | Status: DC

## 2020-10-21 MED ORDER — THIAMINE HCL 100 MG/ML IJ SOLN: 100.0000 mg | Freq: Every day | INTRAMUSCULAR | Status: DC

## 2020-10-21 MED ORDER — ONDANSETRON HCL 4 MG PO TABS
4.0000 mg | ORAL_TABLET | Freq: Three times a day (TID) | ORAL | Status: DC | PRN
Start: 2020-10-21 — End: 2020-10-21

## 2020-10-21 MED ORDER — THIAMINE HCL 100 MG PO TABS
100.0000 mg | ORAL_TABLET | Freq: Every day | ORAL | Status: DC
  Administered 2020-10-21: 100 mg via ORAL
  Filled 2020-10-21: qty 1

## 2020-10-21 MED ORDER — ONDANSETRON 4 MG PO TBDP
4.0000 mg | ORAL_TABLET | Freq: Once | ORAL | Status: AC
  Administered 2020-10-21: 4 mg via ORAL
  Filled 2020-10-21: qty 1

## 2020-10-21 NOTE — ED Notes (Signed)
Pt escorted to bathroom unassisted

## 2020-10-21 NOTE — Discharge Instructions (Addendum)
Cleared by behavioral health for discharge home.  Follow-up with your therapist.  Information and help for substance abuse provided.

## 2020-10-21 NOTE — ED Notes (Signed)
Pt breakfast at bedside 

## 2020-10-21 NOTE — ED Notes (Addendum)
Malik Edwards called who states she is the patients mental health provider has been calling her and has been in a dissociated states since Sunday, stating he needed to die and the best option for him is to die. He has wrote a goodbye email stating the best option for him was to die. Informed I would make note in the patient's chart.

## 2020-10-21 NOTE — Progress Notes (Signed)
Patient information has been sent to [facility] via secure chat to review for potential admission. Patient meets inpatient criteria per [name], NP .   Situation ongoing, CSW will continue to monitor progress.    Signed:  Damita Dunnings, MSW, LCSW-A  10/21/2020 11:37 AM
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
## 2020-10-21 NOTE — ED Provider Notes (Signed)
[facility] EMERGENCY DEPARTMENT Provider Note   CSN: [id number] Arrival date & time: 10/21/20  0105     History Chief Complaint  Patient presents with   Medical Clearance    Malik Edwards is a 45 y.o. male.  Patient is a 45 year old male with past medical history of OCD, PTSD, depression, hypertension, hyperlipidemia, cardiomyopathy, atrial flutter.  Patient presenting today for evaluation of behavioral health concerns.  Patient somewhat vague on the specifics, but tells me that his counselor had advised him to call the police and have him brought to the ER for assessment.  He denies to me that he is suicidal, when I am told may have made some remarks to his counselor.  Patient reports stressors including uncertainty regarding the sale of his home.  He denies drug or alcohol use.  The history is provided by the patient.      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   Routine general medical examination at a health care facility 07/01/2018   Alcohol use disorder, severe, dependence (HCC) 06/14/2018   Hypomagnesemia 12/20/2017   Severe obesity (BMI 35.0-35.9 with comorbidity) (HCC) 09/01/2016   PTSD (post-traumatic stress disorder) 01/13/2015   MDD (major depressive disorder), recurrent severe, 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   OSA (obstructive sleep apnea) 01/22/2013   Tobacco use 10/15/2012    Paroxysmal atrial flutter (HCC) 09/13/2012   Cardiomyopathy- etiol uindetermined- EF 30 to 35% 09/13/2012   HTN (hypertension) 09/13/2012    Past Surgical History:  Procedure Laterality Date   CARDIOVERSION N/A 09/15/2012   Procedure: CARDIOVERSION;  Surgeon: Chrystie Nose, MD;  Location: [facility] OR;  Service: Cardiovascular;  Laterality: N/A;   CARPAL TUNNEL RELEASE Right 04/16/2019   Procedure: CARPAL TUNNEL RELEASE;  Surgeon: Betha Loa, MD;  Location: [facility] OR;  Service: Orthopedics;  Laterality: Right;   ULNAR NERVE TRANSPOSITION Right 04/16/2019   Procedure: RIGHT ULNAR NERVE DECOMPRESSION AT WRIST, RIGHT ULNAR NERVE AT ELBOW;  Surgeon: Betha Loa, MD;  Location: [facility] OR;  Service: Orthopedics;  Laterality: Right;       Family History  Problem Relation Age of Onset   Healthy Mother    Healthy Father    Cancer Father        prostate cancer   Heart attack Maternal Grandmother    Heart attack Maternal Grandfather    Heart attack Paternal Grandmother    Sudden Cardiac Death Neg Hx     Social History   Tobacco Use   Smoking status: Every Day    Packs/day: 0.25    Years: 20.00    Pack years: 5.00    Types: Cigarettes   Smokeless tobacco: Never  Vaping Use   Vaping Use: Never used  Substance Use Topics   Alcohol use: Not Currently    Alcohol/week: 12.0 standard drinks    Types: 12  Standard drinks or equivalent per week    Comment: 1/2 gallon liquor per day. drinks about a fifth about 3 times a week.   Drug use: No    Comment: denied all drug use    Home Medications Prior to Admission medications   Medication Sig Start Date End Date Taking? Authorizing Provider  albuterol (VENTOLIN HFA) 108 (90 Base) MCG/ACT inhaler Inhale 2 puffs into the lungs every 4 (four) hours as needed for wheezing or shortness of breath (cough, shortness of breath or wheezing.). 02/27/19   Etta Grandchild, MD  ARIPiprazole (ABILIFY) 2 MG tablet Take 2 mg by mouth daily.    [provider]  aspirin EC 81 MG EC tablet Take 2 tablets (162 mg total) by mouth daily. For heart health Patient taking differently: Take 162 mg by mouth daily.  09/01/15   Armandina Stammer I, NP  carvedilol (COREG) 25 MG tablet TAKE 2 TABLETS BY MOUTH 2 TIMES DAILY WITH A MEAL 07/25/19   Etta Grandchild, MD  Cholecalciferol (VITAMIN D) 50 MCG (2000 UT) tablet Take 2,000 Units by mouth daily.     [provider]  indapamide (LOZOL) 1.25 MG tablet TAKE 1 TABLET BY MOUTH DAILY 10/31/19   Etta Grandchild, MD  irbesartan (AVAPRO) 300 MG tablet TAKE 1 TABLET BY MOUTH EVERY DAY 10/31/19   Etta Grandchild, MD  Multiple Vitamin (MULTIVITAMIN WITH MINERALS) TABS tablet Take 1 tablet by mouth daily.    [provider]  naltrexone (DEPADE) 50 MG tablet Take 50 mg by mouth daily. 07/07/19   [provider]  REXULTI 2 MG TABS tablet Take 2 mg by mouth daily. 07/18/19   [provider]  Salicylic Acid 40 % PADS Apply 1 Act topically daily as needed. 07/23/19   Etta Grandchild, MD  Vilazodone HCl (VIIBRYD) 40 MG TABS Take 40 mg by mouth daily.    [provider]    Allergies    Gabapentin and Statins  Review of Systems   Review of Systems  All other systems reviewed and are negative.  Physical Exam Updated Vital Signs BP (!) 164/108 (BP Location: Right Arm)   Pulse (!) 116   Temp 97.9 F (36.6 C) (Oral)   Resp 20   Ht 5\' 8"  (1.727 m)   Wt (!) 137.7 kg   SpO2 96%   BMI 46.16 kg/m   Physical Exam Vitals and nursing note reviewed.  Constitutional:      General: He is not in acute distress.    Appearance: He is well-developed. He is not diaphoretic.  HENT:     Head: Normocephalic and atraumatic.  Cardiovascular:     Rate and Rhythm: Normal rate and regular rhythm.     Heart sounds: No murmur heard.   No friction rub.  Pulmonary:     Effort: Pulmonary effort is normal. No respiratory distress.     Breath sounds: Normal breath sounds. No wheezing or rales.  Abdominal:      General: Bowel sounds are normal. There is no distension.     Palpations: Abdomen is soft.     Tenderness: There is no abdominal tenderness.  Musculoskeletal:        General: Normal range of motion.     Cervical back: Normal range of motion and neck supple.  Skin:    General: Skin is warm and dry.  Neurological:     Mental Status: He is alert and oriented to person, place, and time.  Coordination: Coordination normal.  Psychiatric:        Attention and Perception: Attention normal.        Mood and Affect: Mood is depressed.        Behavior: Behavior is withdrawn.        Thought Content: Thought content does not include homicidal or suicidal ideation. Thought content does not include homicidal or suicidal plan.        Cognition and Memory: Cognition normal.    ED Results / Procedures / Treatments   Labs (all labs ordered are listed, but only abnormal results are displayed) Labs Reviewed - No data to display  EKG None  Radiology No results found.  Procedures Procedures   Medications Ordered in ED Medications - No data to display  ED Course  I have reviewed the triage vital signs and the nursing notes.  Pertinent labs & imaging results that were available during my care of the patient were reviewed by me and considered in my medical decision making (see chart for details).    MDM Rules/Calculators/A&P  Patient presenting accompanied by law enforcement for evaluation of possible suicidal ideation.  Patient apparently emailed his counselor a message that concerned him and recommended the patient come here.  He denies to me that he is actively suicidal, but does admit to feeling depressed and stressed.  Patient's work-up reveals a blood alcohol of 233, but is otherwise unremarkable.  TTS will be consulted and noted patient will undergo psychiatric evaluation.  Disposition pending.  Final Clinical Impression(s) / ED Diagnoses Final diagnoses:  None    Rx / DC  Orders ED Discharge Orders     None        Geoffery Lyons, MD 10/21/20 [phone]

## 2020-10-21 NOTE — ED Notes (Signed)
Pt not in lobby at this time. Registration seen pt go back out side after checking in.

## 2020-10-21 NOTE — ED Triage Notes (Signed)
Pt brought in by [facility] after pts counsel called police with concerns of SI.   Pt refusing to answer questions during triage.

## 2020-10-21 NOTE — ED Notes (Signed)
Pt changed into burgundy scrubs per protocol, all personal belongings labeled et locked in locker per protocol, et pt wanded successfully by security

## 2020-10-21 NOTE — ED Provider Notes (Signed)
Patient started on CIWA protocol.  For alcohol withdrawal.  Patient cleared by behavioral health for discharge home.  Offered patient Ativan to help with alcohol withdrawal he does not want it.  Patient currently stable.  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   Vanetta Mulders, MD 10/21/20 1455

## 2020-10-21 NOTE — Consult Note (Signed)
Telepsych Consultation   Reason for Consult:  acute psychosis Referring Physician:  Theotis Barrio, MD Location of Patient: [facility] Location of Provider: Behavioral Health TTS Department  Patient Identification: Malik Edwards MRN:  [id number] Principal Diagnosis: Alcohol-induced mood disorder (HCC) Diagnosis:  Principal Problem:   Alcohol-induced mood disorder (HCC) Active Problems:   PTSD (post-traumatic stress disorder)   Alcohol use disorder, severe, dependence (HCC)   Total Time spent with patient: 20 minutes  Subjective:   Malik Edwards is a 45 y.o. male patient admitted with alcohol induced mood disorder.  "I was home for a few days and was just not feeling good. I don't know just a lot of changes. States he's had to move; currently lives in [place]. Before Saturday I was almost 18 months sober. I just need to speak to my therapist".  Patient states he as living in a sober living facility [facility] [place])prior to relapse Saturday.States he currently has therapist through [employer]; Mrs [name] last saw her last week, "I normally see her once a week generally". Says he normally takes Vybriid and Rexulti; by [facility]. Currently works full time. Denies any thoughts of suicidal or homicidal ideations intent or plan, auditory or visual hallucinations, and does not appear to be responding to any external/internal stimuli. He denies any access to weapons. Patient is able to contract for safety.   Provider discussed [facility] and treatment, in which pt declined stating he owns a home in [place] and would prefer to "seek help on my own". Patient affect appears flat; patient states, "Well I'm currently feeling pretty sick to my stomach. Feeling real nauseous". (RN notified)   HPI:   Malik Edwards is a 44 year old male patient with past history of alcohol use disorder, severe, dependence, major depressive disorder, and  PTSD who was admitted to [facility] voluntarily for evaluation of behavioral health concerns. Per chart review, patient reports his  "counselor" advised him to call the police and have him bought into the ER for an assessment. Patient denied any suicidally on initial assessment and denied any alcohol or drug use; BAL 233, UDS -; PDMP reviewed, no recent prescriptions noted.   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, uncomplicated  Risk to Self:  pt denies Risk to Others:  pt denies Prior Inpatient Therapy:  yes Prior Outpatient Therapy:  yes  Past Medical History:  Past Medical History:  Diagnosis Date   Alcoholic (HCC)    Anxiety    Atrial flutter (HCC) 2013   Cardiomyopathy (HCC)    Colitis    Depression    High cholesterol    Hypertension    OCD (obsessive compulsive disorder)    OSA (obstructive sleep apnea) 01/22/2013   PTSD (post-traumatic stress disorder)    PTSD (post-traumatic stress disorder)     Past Surgical History:  Procedure Laterality Date   CARDIOVERSION N/A 09/15/2012   Procedure: CARDIOVERSION;  Surgeon: Chrystie Nose, MD;  Location: [facility] OR;  Service: Cardiovascular;  Laterality: N/A;   CARPAL TUNNEL RELEASE Right 04/16/2019   Procedure: CARPAL TUNNEL RELEASE;  Surgeon: Betha Loa, MD;  Location: [facility] OR;  Service: Orthopedics;  Laterality: Right;   ULNAR NERVE TRANSPOSITION Right 04/16/2019   Procedure: RIGHT ULNAR NERVE DECOMPRESSION AT WRIST, RIGHT ULNAR NERVE AT ELBOW;  Surgeon: Betha Loa, MD;  Location: [facility] OR;  Service:  Orthopedics;  Laterality: Right;   Family History:  Family History  Problem Relation Age of Onset   Healthy Mother    Healthy Father    Cancer Father        prostate cancer   Heart attack Maternal Grandmother    Heart attack Maternal Grandfather    Heart attack Paternal Grandmother    Sudden Cardiac Death Neg Hx    Family  Psychiatric History: not noted Social History:  Social History   Substance and Sexual Activity  Alcohol Use Not Currently   Alcohol/week: 12.0 standard drinks   Types: 12 Standard drinks or equivalent per week   Comment: 1/2 gallon liquor per day. drinks about a fifth about 3 times a week.     Social History   Substance and Sexual Activity  Drug Use No   Comment: denied all drug use    Social History   Socioeconomic History   Marital status: Married    Spouse name: Keaundre Gallet   Number of children: 0   Years of education: Assoc. Deg   Highest education level: Not on file  Occupational History   Occupation: Forensic scientist: OTHER    Comment: [employer]  Tobacco Use   Smoking status: Every Day    Packs/day: 0.25    Years: 20.00    Pack years: 5.00    Types: Cigarettes   Smokeless tobacco: Never  Vaping Use   Vaping Use: Never used  Substance and Sexual Activity   Alcohol use: Not Currently    Alcohol/week: 12.0 standard drinks    Types: 12 Standard drinks or equivalent per week    Comment: 1/2 gallon liquor per day. drinks about a fifth about 3 times a week.   Drug use: No    Comment: denied all drug use   Sexual activity: Yes    Partners: Female    Birth control/protection: None    Comment: wife  Other Topics Concern   Not on file  Social History Narrative   Patient lives at home with spouse.   Caffeine Use: 1-2 cups daily   Right handed   Two story home   Social Determinants of Health   Financial Resource Strain: Not on file  Food Insecurity: Not on file  Transportation Needs: Not on file  Physical Activity: Not on file  Stress: Not on file  Social Connections: Not on file   Additional Social History:    Allergies:   Allergies  Allergen Reactions   Gabapentin Swelling    SWELLING REACTION UNSPECIFIED    Statins Other (See Comments)    Elevated LFTs  (Lipitor specifically)    Labs:  Results for orders placed or performed during  the hospital encounter of 10/21/20 (from the past 48 hour(s))  Urine rapid drug screen (hosp performed)     Status: None   Collection Time: 10/21/20  3:38 AM  Result Value Ref Range   Opiates NONE DETECTED NONE DETECTED   Cocaine NONE DETECTED NONE DETECTED   Benzodiazepines NONE DETECTED NONE DETECTED   Amphetamines NONE DETECTED NONE DETECTED   Tetrahydrocannabinol NONE DETECTED NONE DETECTED   Barbiturates NONE DETECTED NONE DETECTED    Comment: (NOTE) DRUG SCREEN FOR MEDICAL PURPOSES ONLY.  IF CONFIRMATION IS NEEDED FOR ANY PURPOSE, NOTIFY LAB WITHIN 5 DAYS.  LOWEST DETECTABLE LIMITS FOR URINE DRUG SCREEN Drug Class  Cutoff (ng/mL) Amphetamine and metabolites    1000 Barbiturate and metabolites    200 Benzodiazepine                 200 Tricyclics and metabolites     300 Opiates and metabolites        300 Cocaine and metabolites        300 THC                            50 Performed at [facility], [address] [place], [place] [address]   Basic metabolic panel     Status: Abnormal   Collection Time: 10/21/20  4:20 AM  Result Value Ref Range   Sodium 139 135 - 145 mmol/L   Potassium 4.3 3.5 - 5.1 mmol/L   Chloride 102 98 - 111 mmol/L   CO2 24 22 - 32 mmol/L   Glucose, Bld 119 (H) 70 - 99 mg/dL    Comment: Glucose reference range applies only to samples taken after fasting for at least 8 hours.   BUN 21 (H) 6 - 20 mg/dL   Creatinine, Ser 2.02 0.61 - 1.24 mg/dL   Calcium 8.3 (L) 8.9 - 10.3 mg/dL   GFR, Estimated >54 >27 mL/min    Comment: (NOTE) Calculated using the CKD-EPI Creatinine Equation (2021)    Anion gap 13 5 - 15    Comment: Performed at [facility], [address], [place], [place] [address]  CBC with Differential     Status: Abnormal   Collection Time: 10/21/20  4:20 AM  Result Value Ref Range   WBC 16.0 (H) 4.0 - 10.5 K/uL   RBC 5.57 4.22 - 5.81 MIL/uL   Hemoglobin 17.5 (H) 13.0 - 17.0 g/dL   HCT 62.8 31.5 - 17.6 %   MCV 90.3  80.0 - 100.0 fL   MCH 31.4 26.0 - 34.0 pg   MCHC 34.8 30.0 - 36.0 g/dL   RDW 16.0 73.7 - 10.6 %   Platelets 252 150 - 400 K/uL   nRBC 0.0 0.0 - 0.2 %   Neutrophils Relative % 74 %   Neutro Abs 11.9 (H) 1.7 - 7.7 K/uL   Lymphocytes Relative 20 %   Lymphs Abs 3.1 0.7 - 4.0 K/uL   Monocytes Relative 4 %   Monocytes Absolute 0.7 0.1 - 1.0 K/uL   Eosinophils Relative 1 %   Eosinophils Absolute 0.1 0.0 - 0.5 K/uL   Basophils Relative 1 %   Basophils Absolute 0.1 0.0 - 0.1 K/uL   Immature Granulocytes 0 %   Abs Immature Granulocytes 0.05 0.00 - 0.07 K/uL    Comment: Performed at [facility], [address], [place], [place] [address]  Ethanol     Status: Abnormal   Collection Time: 10/21/20  4:20 AM  Result Value Ref Range   Alcohol, Ethyl (B) 233 (H) <10 mg/dL    Comment: (NOTE) Lowest detectable limit for serum alcohol is 10 mg/dL.  For medical purposes only. Performed at [facility], [address] [place], [place] [address]     Medications:  Current Facility-Administered Medications  Medication Dose Route Frequency Provider Last Rate Last Admin   LORazepam (ATIVAN) injection 0-4 mg  0-4 mg Intravenous Q6H Vanetta Mulders, MD       Or   LORazepam (ATIVAN) tablet 0-4 mg  0-4 mg Oral Q6H Vanetta Mulders, MD   1 mg at 10/21/20 1246   [START ON  10/23/2020] LORazepam (ATIVAN) injection 0-4 mg  0-4 mg Intravenous Q12H Vanetta Mulders, MD       Or   Melene Muller ON 10/23/2020] LORazepam (ATIVAN) tablet 0-4 mg  0-4 mg Oral Q12H Vanetta Mulders, MD       ondansetron [facility]) tablet 4 mg  4 mg Oral Q8H PRN Vanetta Mulders, MD       thiamine tablet 100 mg  100 mg Oral Daily Vanetta Mulders, MD   100 mg at 10/21/20 1248   Or   thiamine (B-1) injection 100 mg  100 mg Intravenous Daily Vanetta Mulders, MD       Current Outpatient Medications  Medication Sig Dispense Refill   albuterol (VENTOLIN HFA) 108 (90 Base) MCG/ACT inhaler Inhale 2 puffs into the lungs every 4 (four) hours as  needed for wheezing or shortness of breath (cough, shortness of breath or wheezing.). 18 g 3   ARIPiprazole (ABILIFY) 2 MG tablet Take 2 mg by mouth daily.     aspirin EC 81 MG EC tablet Take 2 tablets (162 mg total) by mouth daily. For heart health (Patient taking differently: Take 162 mg by mouth daily. )     carvedilol (COREG) 25 MG tablet TAKE 2 TABLETS BY MOUTH 2 TIMES DAILY WITH A MEAL 360 tablet 1   Cholecalciferol (VITAMIN D) 50 MCG (2000 UT) tablet Take 2,000 Units by mouth daily.      indapamide (LOZOL) 1.25 MG tablet TAKE 1 TABLET BY MOUTH DAILY 90 tablet 1   irbesartan (AVAPRO) 300 MG tablet TAKE 1 TABLET BY MOUTH EVERY DAY 90 tablet 1   Multiple Vitamin (MULTIVITAMIN WITH MINERALS) TABS tablet Take 1 tablet by mouth daily.     naltrexone (DEPADE) 50 MG tablet Take 50 mg by mouth daily.     REXULTI 2 MG TABS tablet Take 2 mg by mouth daily.     Salicylic Acid 40 % PADS Apply 1 Act topically daily as needed. 48 each 3   Vilazodone HCl (VIIBRYD) 40 MG TABS Take 40 mg by mouth daily.     Musculoskeletal: Strength & Muscle Tone: within normal limits Gait & Station: normal Patient leans: N/A  Psychiatric Specialty Exam:  Presentation  General Appearance:  Casual; Appropriate for Environment Eye Contact: Fair Speech: Clear and Coherent Speech Volume: Normal Handedness: No data recorded  Mood and Affect  Mood: Euthymic Affect: Flat  Thought Process  Thought Processes: Goal Directed; Coherent Descriptions of Associations:Intact Orientation:Full (Time, Place and Person) Thought Content:Logical History of Schizophrenia/Schizoaffective disorder:No data recorded Duration of Psychotic Symptoms:No data recorded Hallucinations:Hallucinations: None Ideas of Reference:None Suicidal Thoughts:Suicidal Thoughts: No Homicidal Thoughts:Homicidal Thoughts: No  Sensorium  Memory: Immediate Fair; Recent Fair; Remote Fair Judgment: Fair Insight: Fair  Art therapist   Concentration: Fair Attention Span: Fair Recall: [employer] of Knowledge: Fair Language: Fair  Psychomotor Activity  Psychomotor Activity: Psychomotor Activity: Normal  Assets  Assets: Communication Skills; Desire for Improvement; Financial Resources/Insurance; Housing; Physical Health; Resilience; Transportation; Vocational/Educational  Sleep  Sleep: Sleep: Good   Physical Exam: Physical Exam Vitals and nursing note reviewed.  Constitutional:      General: He is not in acute distress.    Appearance: He is obese.  HENT:     Head: Normocephalic.     Nose: Nose normal.     Mouth/Throat:     Mouth: Mucous membranes are moist.     Pharynx: Oropharynx is clear.  Eyes:     Pupils: Pupils are equal, round, and reactive to light.  Cardiovascular:     Rate and Rhythm: Tachycardia present.  Pulmonary:     Effort: Pulmonary effort is normal.  Musculoskeletal:        General: Normal range of motion.     Cervical back: Normal range of motion.  Skin:    Coloration: Skin is not pale.  Neurological:     General: No focal deficit present.     Mental Status: He is alert. Mental status is at baseline.  Psychiatric:        Attention and Perception: He is attentive. He does not perceive auditory or visual hallucinations.        Mood and Affect: Mood normal. Affect is flat.        Speech: Speech normal.        Behavior: Behavior is cooperative.        Thought Content: Thought content is not paranoid or delusional. Thought content does not include homicidal or suicidal ideation. Thought content does not include homicidal or suicidal plan.        Cognition and Memory: Cognition and memory normal.        Judgment: Judgment normal.   Review of Systems  Psychiatric/Behavioral:  Positive for substance abuse. Negative for depression, hallucinations and suicidal ideas.   All other systems reviewed and are negative. Blood pressure (!) 179/110, pulse (!) 118, temperature 98.4 F (36.9  C), resp. rate 18, height 5\' 8"  (1.727 m), weight (!) 137.7 kg, SpO2 96 %. Body mass index is 46.16 kg/m.  Treatment Plan Summary: Plan Discharge patient home with plan to follow up with outpatient mental health providers for continued therapy and medication management.   Disposition: No evidence of imminent risk to self or others at present.   Patient does not meet criteria for psychiatric inpatient admission. Supportive therapy provided about ongoing stressors. Discussed crisis plan, support from social network, calling 911, coming to the Emergency Department, and calling Suicide Hotline.  This service was provided via telemedicine using a 2-way, interactive audio and video technology.  Names of all persons participating in this telemedicine service and their role in this encounter. Name: Role: Attending MD  Name: Nelly Rout Role: PMHNP  Name: Malik Edwards Role: patient  Name:  Role:     Posey Rea, NP 10/21/2020 2:08 PM

## 2020-10-21 NOTE — ED Notes (Signed)
Pt states Sherre Scarlet is his therapist and ok to discuss health information with her. Her number is [phone]. Per Vernona Rieger, pt to not have her phone number.

## 2020-10-22 DIAGNOSIS — F4481 Dissociative identity disorder: Secondary | ICD-10-CM | POA: Diagnosis not present

## 2020-10-22 DIAGNOSIS — F4310 Post-traumatic stress disorder, unspecified: Secondary | ICD-10-CM | POA: Diagnosis not present

## 2020-10-24 DIAGNOSIS — F4310 Post-traumatic stress disorder, unspecified: Secondary | ICD-10-CM | POA: Diagnosis not present

## 2020-10-24 DIAGNOSIS — F4481 Dissociative identity disorder: Secondary | ICD-10-CM | POA: Diagnosis not present

## 2020-10-25 DIAGNOSIS — F4481 Dissociative identity disorder: Secondary | ICD-10-CM | POA: Diagnosis not present

## 2020-10-25 DIAGNOSIS — F4310 Post-traumatic stress disorder, unspecified: Secondary | ICD-10-CM | POA: Diagnosis not present

## 2020-10-26 DIAGNOSIS — F4310 Post-traumatic stress disorder, unspecified: Secondary | ICD-10-CM | POA: Diagnosis not present

## 2020-10-26 DIAGNOSIS — F4481 Dissociative identity disorder: Secondary | ICD-10-CM | POA: Diagnosis not present

## 2020-10-27 DIAGNOSIS — F4481 Dissociative identity disorder: Secondary | ICD-10-CM | POA: Diagnosis not present

## 2020-10-27 DIAGNOSIS — F4310 Post-traumatic stress disorder, unspecified: Secondary | ICD-10-CM | POA: Diagnosis not present

## 2020-10-31 DIAGNOSIS — F4310 Post-traumatic stress disorder, unspecified: Secondary | ICD-10-CM | POA: Diagnosis not present

## 2020-10-31 DIAGNOSIS — F4481 Dissociative identity disorder: Secondary | ICD-10-CM | POA: Diagnosis not present

## 2020-11-03 DIAGNOSIS — F4310 Post-traumatic stress disorder, unspecified: Secondary | ICD-10-CM | POA: Diagnosis not present

## 2020-11-03 DIAGNOSIS — F4481 Dissociative identity disorder: Secondary | ICD-10-CM | POA: Diagnosis not present

## 2020-11-04 DIAGNOSIS — F4310 Post-traumatic stress disorder, unspecified: Secondary | ICD-10-CM | POA: Diagnosis not present

## 2020-11-04 DIAGNOSIS — F4481 Dissociative identity disorder: Secondary | ICD-10-CM | POA: Diagnosis not present

## 2020-11-07 DIAGNOSIS — F4310 Post-traumatic stress disorder, unspecified: Secondary | ICD-10-CM | POA: Diagnosis not present

## 2020-11-07 DIAGNOSIS — F4481 Dissociative identity disorder: Secondary | ICD-10-CM | POA: Diagnosis not present

## 2020-11-11 DIAGNOSIS — F4481 Dissociative identity disorder: Secondary | ICD-10-CM | POA: Diagnosis not present

## 2020-11-11 DIAGNOSIS — F4310 Post-traumatic stress disorder, unspecified: Secondary | ICD-10-CM | POA: Diagnosis not present

## 2020-11-14 DIAGNOSIS — F4310 Post-traumatic stress disorder, unspecified: Secondary | ICD-10-CM | POA: Diagnosis not present

## 2020-11-14 DIAGNOSIS — F4481 Dissociative identity disorder: Secondary | ICD-10-CM | POA: Diagnosis not present

## 2020-11-21 DIAGNOSIS — F4481 Dissociative identity disorder: Secondary | ICD-10-CM | POA: Diagnosis not present

## 2020-11-21 DIAGNOSIS — F4310 Post-traumatic stress disorder, unspecified: Secondary | ICD-10-CM | POA: Diagnosis not present

## 2020-12-05 DIAGNOSIS — F4481 Dissociative identity disorder: Secondary | ICD-10-CM | POA: Diagnosis not present

## 2020-12-05 DIAGNOSIS — F4310 Post-traumatic stress disorder, unspecified: Secondary | ICD-10-CM | POA: Diagnosis not present

## 2020-12-12 DIAGNOSIS — F4310 Post-traumatic stress disorder, unspecified: Secondary | ICD-10-CM | POA: Diagnosis not present

## 2020-12-12 DIAGNOSIS — F4481 Dissociative identity disorder: Secondary | ICD-10-CM | POA: Diagnosis not present

## 2020-12-14 DIAGNOSIS — F4481 Dissociative identity disorder: Secondary | ICD-10-CM | POA: Diagnosis not present

## 2020-12-14 DIAGNOSIS — F4310 Post-traumatic stress disorder, unspecified: Secondary | ICD-10-CM | POA: Diagnosis not present

## 2020-12-15 DIAGNOSIS — F4481 Dissociative identity disorder: Secondary | ICD-10-CM | POA: Diagnosis not present

## 2020-12-15 DIAGNOSIS — F4310 Post-traumatic stress disorder, unspecified: Secondary | ICD-10-CM | POA: Diagnosis not present

## 2020-12-19 DIAGNOSIS — F4481 Dissociative identity disorder: Secondary | ICD-10-CM | POA: Diagnosis not present

## 2020-12-19 DIAGNOSIS — F4310 Post-traumatic stress disorder, unspecified: Secondary | ICD-10-CM | POA: Diagnosis not present

## 2020-12-21 DIAGNOSIS — F4481 Dissociative identity disorder: Secondary | ICD-10-CM | POA: Diagnosis not present

## 2020-12-21 DIAGNOSIS — F4310 Post-traumatic stress disorder, unspecified: Secondary | ICD-10-CM | POA: Diagnosis not present

## 2020-12-26 DIAGNOSIS — F4481 Dissociative identity disorder: Secondary | ICD-10-CM | POA: Diagnosis not present

## 2020-12-26 DIAGNOSIS — F4310 Post-traumatic stress disorder, unspecified: Secondary | ICD-10-CM | POA: Diagnosis not present

## 2021-01-02 DIAGNOSIS — F4481 Dissociative identity disorder: Secondary | ICD-10-CM | POA: Diagnosis not present

## 2021-01-02 DIAGNOSIS — F4310 Post-traumatic stress disorder, unspecified: Secondary | ICD-10-CM | POA: Diagnosis not present

## 2021-01-10 DIAGNOSIS — F4481 Dissociative identity disorder: Secondary | ICD-10-CM | POA: Diagnosis not present

## 2021-01-10 DIAGNOSIS — F4310 Post-traumatic stress disorder, unspecified: Secondary | ICD-10-CM | POA: Diagnosis not present

## 2021-01-30 DIAGNOSIS — F4481 Dissociative identity disorder: Secondary | ICD-10-CM | POA: Diagnosis not present

## 2021-01-30 DIAGNOSIS — F4310 Post-traumatic stress disorder, unspecified: Secondary | ICD-10-CM | POA: Diagnosis not present

## 2021-02-06 DIAGNOSIS — F4310 Post-traumatic stress disorder, unspecified: Secondary | ICD-10-CM | POA: Diagnosis not present

## 2021-02-06 DIAGNOSIS — F4481 Dissociative identity disorder: Secondary | ICD-10-CM | POA: Diagnosis not present

## 2021-02-22 DIAGNOSIS — F4481 Dissociative identity disorder: Secondary | ICD-10-CM | POA: Diagnosis not present

## 2021-02-22 DIAGNOSIS — F4310 Post-traumatic stress disorder, unspecified: Secondary | ICD-10-CM | POA: Diagnosis not present

## 2021-02-26 DIAGNOSIS — F4310 Post-traumatic stress disorder, unspecified: Secondary | ICD-10-CM | POA: Diagnosis not present

## 2021-02-26 DIAGNOSIS — F4481 Dissociative identity disorder: Secondary | ICD-10-CM | POA: Diagnosis not present

## 2021-03-06 DIAGNOSIS — F4310 Post-traumatic stress disorder, unspecified: Secondary | ICD-10-CM | POA: Diagnosis not present

## 2021-03-06 DIAGNOSIS — F4481 Dissociative identity disorder: Secondary | ICD-10-CM | POA: Diagnosis not present

## 2021-03-13 DIAGNOSIS — F4310 Post-traumatic stress disorder, unspecified: Secondary | ICD-10-CM | POA: Diagnosis not present

## 2021-03-13 DIAGNOSIS — F4481 Dissociative identity disorder: Secondary | ICD-10-CM | POA: Diagnosis not present

## 2021-03-23 DIAGNOSIS — F4481 Dissociative identity disorder: Secondary | ICD-10-CM | POA: Diagnosis not present

## 2021-03-23 DIAGNOSIS — F4310 Post-traumatic stress disorder, unspecified: Secondary | ICD-10-CM | POA: Diagnosis not present

## 2021-03-25 DIAGNOSIS — F4481 Dissociative identity disorder: Secondary | ICD-10-CM | POA: Diagnosis not present

## 2021-03-25 DIAGNOSIS — F4310 Post-traumatic stress disorder, unspecified: Secondary | ICD-10-CM | POA: Diagnosis not present

## 2021-03-27 DIAGNOSIS — R45851 Suicidal ideations: Secondary | ICD-10-CM | POA: Diagnosis not present

## 2021-03-27 DIAGNOSIS — F1010 Alcohol abuse, uncomplicated: Secondary | ICD-10-CM | POA: Diagnosis not present

## 2021-03-27 DIAGNOSIS — I10 Essential (primary) hypertension: Secondary | ICD-10-CM | POA: Diagnosis not present

## 2021-03-27 DIAGNOSIS — F1020 Alcohol dependence, uncomplicated: Secondary | ICD-10-CM | POA: Diagnosis not present

## 2021-03-27 DIAGNOSIS — F411 Generalized anxiety disorder: Secondary | ICD-10-CM | POA: Diagnosis not present

## 2021-03-27 DIAGNOSIS — F332 Major depressive disorder, recurrent severe without psychotic features: Secondary | ICD-10-CM | POA: Diagnosis not present

## 2021-03-27 DIAGNOSIS — Z20822 Contact with and (suspected) exposure to covid-19: Secondary | ICD-10-CM | POA: Diagnosis not present

## 2021-03-27 DIAGNOSIS — F32A Depression, unspecified: Secondary | ICD-10-CM | POA: Diagnosis not present

## 2021-03-28 DIAGNOSIS — F17200 Nicotine dependence, unspecified, uncomplicated: Secondary | ICD-10-CM | POA: Diagnosis not present

## 2021-03-28 DIAGNOSIS — F1020 Alcohol dependence, uncomplicated: Secondary | ICD-10-CM | POA: Diagnosis not present

## 2021-03-28 DIAGNOSIS — F332 Major depressive disorder, recurrent severe without psychotic features: Secondary | ICD-10-CM | POA: Diagnosis not present

## 2021-03-28 DIAGNOSIS — F411 Generalized anxiety disorder: Secondary | ICD-10-CM | POA: Diagnosis not present

## 2021-03-29 DIAGNOSIS — Z20822 Contact with and (suspected) exposure to covid-19: Secondary | ICD-10-CM | POA: Diagnosis not present

## 2021-03-29 DIAGNOSIS — F332 Major depressive disorder, recurrent severe without psychotic features: Secondary | ICD-10-CM | POA: Diagnosis not present

## 2021-03-29 DIAGNOSIS — R45851 Suicidal ideations: Secondary | ICD-10-CM | POA: Diagnosis not present

## 2021-03-29 DIAGNOSIS — F1010 Alcohol abuse, uncomplicated: Secondary | ICD-10-CM | POA: Diagnosis not present

## 2021-03-29 DIAGNOSIS — F411 Generalized anxiety disorder: Secondary | ICD-10-CM | POA: Diagnosis not present

## 2021-03-29 DIAGNOSIS — F32A Depression, unspecified: Secondary | ICD-10-CM | POA: Diagnosis not present

## 2021-03-29 DIAGNOSIS — F1020 Alcohol dependence, uncomplicated: Secondary | ICD-10-CM | POA: Diagnosis not present

## 2021-03-29 DIAGNOSIS — F17200 Nicotine dependence, unspecified, uncomplicated: Secondary | ICD-10-CM | POA: Diagnosis not present

## 2021-03-30 DIAGNOSIS — F10229 Alcohol dependence with intoxication, unspecified: Secondary | ICD-10-CM | POA: Diagnosis not present

## 2021-03-30 DIAGNOSIS — F17200 Nicotine dependence, unspecified, uncomplicated: Secondary | ICD-10-CM | POA: Diagnosis not present

## 2021-03-30 DIAGNOSIS — F319 Bipolar disorder, unspecified: Secondary | ICD-10-CM | POA: Diagnosis not present

## 2021-03-30 DIAGNOSIS — F1210 Cannabis abuse, uncomplicated: Secondary | ICD-10-CM | POA: Diagnosis not present

## 2021-03-31 DIAGNOSIS — F17200 Nicotine dependence, unspecified, uncomplicated: Secondary | ICD-10-CM | POA: Diagnosis not present

## 2021-03-31 DIAGNOSIS — F1210 Cannabis abuse, uncomplicated: Secondary | ICD-10-CM | POA: Diagnosis not present

## 2021-03-31 DIAGNOSIS — F10229 Alcohol dependence with intoxication, unspecified: Secondary | ICD-10-CM | POA: Diagnosis not present

## 2021-03-31 DIAGNOSIS — F319 Bipolar disorder, unspecified: Secondary | ICD-10-CM | POA: Diagnosis not present

## 2021-04-01 DIAGNOSIS — F10229 Alcohol dependence with intoxication, unspecified: Secondary | ICD-10-CM | POA: Diagnosis not present

## 2021-04-01 DIAGNOSIS — F1210 Cannabis abuse, uncomplicated: Secondary | ICD-10-CM | POA: Diagnosis not present

## 2021-04-01 DIAGNOSIS — F17200 Nicotine dependence, unspecified, uncomplicated: Secondary | ICD-10-CM | POA: Diagnosis not present

## 2021-04-01 DIAGNOSIS — F319 Bipolar disorder, unspecified: Secondary | ICD-10-CM | POA: Diagnosis not present

## 2021-04-02 DIAGNOSIS — F17200 Nicotine dependence, unspecified, uncomplicated: Secondary | ICD-10-CM | POA: Diagnosis not present

## 2021-04-02 DIAGNOSIS — F319 Bipolar disorder, unspecified: Secondary | ICD-10-CM | POA: Diagnosis not present

## 2021-04-02 DIAGNOSIS — F1210 Cannabis abuse, uncomplicated: Secondary | ICD-10-CM | POA: Diagnosis not present

## 2021-04-02 DIAGNOSIS — F10229 Alcohol dependence with intoxication, unspecified: Secondary | ICD-10-CM | POA: Diagnosis not present

## 2021-04-03 DIAGNOSIS — F17200 Nicotine dependence, unspecified, uncomplicated: Secondary | ICD-10-CM | POA: Diagnosis not present

## 2021-04-03 DIAGNOSIS — F1210 Cannabis abuse, uncomplicated: Secondary | ICD-10-CM | POA: Diagnosis not present

## 2021-04-03 DIAGNOSIS — F319 Bipolar disorder, unspecified: Secondary | ICD-10-CM | POA: Diagnosis not present

## 2021-04-03 DIAGNOSIS — F10229 Alcohol dependence with intoxication, unspecified: Secondary | ICD-10-CM | POA: Diagnosis not present

## 2021-04-04 DIAGNOSIS — F17200 Nicotine dependence, unspecified, uncomplicated: Secondary | ICD-10-CM | POA: Diagnosis not present

## 2021-04-04 DIAGNOSIS — F1210 Cannabis abuse, uncomplicated: Secondary | ICD-10-CM | POA: Diagnosis not present

## 2021-04-04 DIAGNOSIS — F10229 Alcohol dependence with intoxication, unspecified: Secondary | ICD-10-CM | POA: Diagnosis not present

## 2021-04-04 DIAGNOSIS — F319 Bipolar disorder, unspecified: Secondary | ICD-10-CM | POA: Diagnosis not present

## 2021-04-05 DIAGNOSIS — F4481 Dissociative identity disorder: Secondary | ICD-10-CM | POA: Diagnosis not present

## 2021-04-05 DIAGNOSIS — F4310 Post-traumatic stress disorder, unspecified: Secondary | ICD-10-CM | POA: Diagnosis not present

## 2021-04-25 DIAGNOSIS — R000 Tachycardia, unspecified: Secondary | ICD-10-CM | POA: Diagnosis not present

## 2021-04-25 DIAGNOSIS — I10 Essential (primary) hypertension: Secondary | ICD-10-CM | POA: Diagnosis not present

## 2021-04-25 DIAGNOSIS — F1410 Cocaine abuse, uncomplicated: Secondary | ICD-10-CM | POA: Diagnosis not present

## 2021-04-25 DIAGNOSIS — K8689 Other specified diseases of pancreas: Secondary | ICD-10-CM | POA: Diagnosis not present

## 2021-04-25 DIAGNOSIS — J441 Chronic obstructive pulmonary disease with (acute) exacerbation: Secondary | ICD-10-CM | POA: Diagnosis not present

## 2021-04-25 DIAGNOSIS — F333 Major depressive disorder, recurrent, severe with psychotic symptoms: Secondary | ICD-10-CM | POA: Diagnosis not present

## 2021-04-25 DIAGNOSIS — R531 Weakness: Secondary | ICD-10-CM | POA: Diagnosis not present

## 2021-04-25 DIAGNOSIS — F10239 Alcohol dependence with withdrawal, unspecified: Secondary | ICD-10-CM | POA: Diagnosis not present

## 2021-04-27 DIAGNOSIS — J9811 Atelectasis: Secondary | ICD-10-CM | POA: Diagnosis not present

## 2021-04-27 DIAGNOSIS — R0602 Shortness of breath: Secondary | ICD-10-CM | POA: Diagnosis not present

## 2021-04-29 DIAGNOSIS — R000 Tachycardia, unspecified: Secondary | ICD-10-CM | POA: Diagnosis not present

## 2021-04-29 DIAGNOSIS — K852 Alcohol induced acute pancreatitis without necrosis or infection: Secondary | ICD-10-CM | POA: Diagnosis not present

## 2021-04-29 DIAGNOSIS — F10930 Alcohol use, unspecified with withdrawal, uncomplicated: Secondary | ICD-10-CM | POA: Diagnosis not present

## 2021-04-29 DIAGNOSIS — F10939 Alcohol use, unspecified with withdrawal, unspecified: Secondary | ICD-10-CM | POA: Diagnosis not present

## 2021-04-29 DIAGNOSIS — F332 Major depressive disorder, recurrent severe without psychotic features: Secondary | ICD-10-CM | POA: Diagnosis not present

## 2021-04-29 DIAGNOSIS — J690 Pneumonitis due to inhalation of food and vomit: Secondary | ICD-10-CM | POA: Diagnosis not present

## 2021-04-29 DIAGNOSIS — F1410 Cocaine abuse, uncomplicated: Secondary | ICD-10-CM | POA: Diagnosis not present

## 2021-04-30 DIAGNOSIS — J690 Pneumonitis due to inhalation of food and vomit: Secondary | ICD-10-CM | POA: Diagnosis not present

## 2021-04-30 DIAGNOSIS — R000 Tachycardia, unspecified: Secondary | ICD-10-CM | POA: Diagnosis not present

## 2021-04-30 DIAGNOSIS — K852 Alcohol induced acute pancreatitis without necrosis or infection: Secondary | ICD-10-CM | POA: Diagnosis not present

## 2021-04-30 DIAGNOSIS — F10939 Alcohol use, unspecified with withdrawal, unspecified: Secondary | ICD-10-CM | POA: Diagnosis not present

## 2021-04-30 DIAGNOSIS — F10930 Alcohol use, unspecified with withdrawal, uncomplicated: Secondary | ICD-10-CM | POA: Diagnosis not present

## 2021-05-01 DIAGNOSIS — R7401 Elevation of levels of liver transaminase levels: Secondary | ICD-10-CM | POA: Diagnosis not present

## 2021-05-01 DIAGNOSIS — F1020 Alcohol dependence, uncomplicated: Secondary | ICD-10-CM | POA: Diagnosis not present

## 2021-05-01 DIAGNOSIS — F3162 Bipolar disorder, current episode mixed, moderate: Secondary | ICD-10-CM | POA: Diagnosis not present

## 2021-05-01 DIAGNOSIS — F1410 Cocaine abuse, uncomplicated: Secondary | ICD-10-CM | POA: Diagnosis not present

## 2021-05-02 DIAGNOSIS — F3162 Bipolar disorder, current episode mixed, moderate: Secondary | ICD-10-CM | POA: Diagnosis not present

## 2021-05-02 DIAGNOSIS — R7401 Elevation of levels of liver transaminase levels: Secondary | ICD-10-CM | POA: Diagnosis not present

## 2021-05-02 DIAGNOSIS — F1410 Cocaine abuse, uncomplicated: Secondary | ICD-10-CM | POA: Diagnosis not present

## 2021-05-02 DIAGNOSIS — F1020 Alcohol dependence, uncomplicated: Secondary | ICD-10-CM | POA: Diagnosis not present

## 2021-05-03 DIAGNOSIS — F1410 Cocaine abuse, uncomplicated: Secondary | ICD-10-CM | POA: Diagnosis not present

## 2021-05-03 DIAGNOSIS — F3162 Bipolar disorder, current episode mixed, moderate: Secondary | ICD-10-CM | POA: Diagnosis not present

## 2021-05-03 DIAGNOSIS — F1020 Alcohol dependence, uncomplicated: Secondary | ICD-10-CM | POA: Diagnosis not present

## 2021-05-03 DIAGNOSIS — R7401 Elevation of levels of liver transaminase levels: Secondary | ICD-10-CM | POA: Diagnosis not present

## 2021-05-04 DIAGNOSIS — F1020 Alcohol dependence, uncomplicated: Secondary | ICD-10-CM | POA: Diagnosis not present

## 2021-05-04 DIAGNOSIS — F3162 Bipolar disorder, current episode mixed, moderate: Secondary | ICD-10-CM | POA: Diagnosis not present

## 2021-05-04 DIAGNOSIS — R7401 Elevation of levels of liver transaminase levels: Secondary | ICD-10-CM | POA: Diagnosis not present

## 2021-05-04 DIAGNOSIS — F1410 Cocaine abuse, uncomplicated: Secondary | ICD-10-CM | POA: Diagnosis not present

## 2021-05-05 DIAGNOSIS — F3162 Bipolar disorder, current episode mixed, moderate: Secondary | ICD-10-CM | POA: Diagnosis not present

## 2021-05-05 DIAGNOSIS — F17200 Nicotine dependence, unspecified, uncomplicated: Secondary | ICD-10-CM | POA: Diagnosis not present

## 2021-05-05 DIAGNOSIS — F4310 Post-traumatic stress disorder, unspecified: Secondary | ICD-10-CM | POA: Diagnosis not present

## 2021-05-05 DIAGNOSIS — R7401 Elevation of levels of liver transaminase levels: Secondary | ICD-10-CM | POA: Diagnosis not present

## 2021-05-05 DIAGNOSIS — F4481 Dissociative identity disorder: Secondary | ICD-10-CM | POA: Diagnosis not present

## 2021-05-05 DIAGNOSIS — F319 Bipolar disorder, unspecified: Secondary | ICD-10-CM | POA: Diagnosis not present

## 2021-05-05 DIAGNOSIS — F1410 Cocaine abuse, uncomplicated: Secondary | ICD-10-CM | POA: Diagnosis not present

## 2021-05-05 DIAGNOSIS — F1020 Alcohol dependence, uncomplicated: Secondary | ICD-10-CM | POA: Diagnosis not present

## 2021-05-09 DIAGNOSIS — F4310 Post-traumatic stress disorder, unspecified: Secondary | ICD-10-CM | POA: Diagnosis not present

## 2021-05-09 DIAGNOSIS — F4481 Dissociative identity disorder: Secondary | ICD-10-CM | POA: Diagnosis not present

## 2021-05-09 DIAGNOSIS — F17200 Nicotine dependence, unspecified, uncomplicated: Secondary | ICD-10-CM | POA: Diagnosis not present

## 2021-05-09 DIAGNOSIS — F319 Bipolar disorder, unspecified: Secondary | ICD-10-CM | POA: Diagnosis not present

## 2021-05-09 DIAGNOSIS — F1020 Alcohol dependence, uncomplicated: Secondary | ICD-10-CM | POA: Diagnosis not present

## 2021-05-10 DIAGNOSIS — F319 Bipolar disorder, unspecified: Secondary | ICD-10-CM | POA: Diagnosis not present

## 2021-05-10 DIAGNOSIS — F1020 Alcohol dependence, uncomplicated: Secondary | ICD-10-CM | POA: Diagnosis not present

## 2021-05-10 DIAGNOSIS — F4481 Dissociative identity disorder: Secondary | ICD-10-CM | POA: Diagnosis not present

## 2021-05-10 DIAGNOSIS — F4310 Post-traumatic stress disorder, unspecified: Secondary | ICD-10-CM | POA: Diagnosis not present

## 2021-05-10 DIAGNOSIS — F17200 Nicotine dependence, unspecified, uncomplicated: Secondary | ICD-10-CM | POA: Diagnosis not present

## 2021-05-11 DIAGNOSIS — F1020 Alcohol dependence, uncomplicated: Secondary | ICD-10-CM | POA: Diagnosis not present

## 2021-05-11 DIAGNOSIS — F319 Bipolar disorder, unspecified: Secondary | ICD-10-CM | POA: Diagnosis not present

## 2021-05-11 DIAGNOSIS — F4310 Post-traumatic stress disorder, unspecified: Secondary | ICD-10-CM | POA: Diagnosis not present

## 2021-05-11 DIAGNOSIS — F4481 Dissociative identity disorder: Secondary | ICD-10-CM | POA: Diagnosis not present

## 2021-05-11 DIAGNOSIS — F17200 Nicotine dependence, unspecified, uncomplicated: Secondary | ICD-10-CM | POA: Diagnosis not present

## 2021-05-12 DIAGNOSIS — F17200 Nicotine dependence, unspecified, uncomplicated: Secondary | ICD-10-CM | POA: Diagnosis not present

## 2021-05-12 DIAGNOSIS — F4310 Post-traumatic stress disorder, unspecified: Secondary | ICD-10-CM | POA: Diagnosis not present

## 2021-05-12 DIAGNOSIS — F1020 Alcohol dependence, uncomplicated: Secondary | ICD-10-CM | POA: Diagnosis not present

## 2021-05-12 DIAGNOSIS — F319 Bipolar disorder, unspecified: Secondary | ICD-10-CM | POA: Diagnosis not present

## 2021-05-12 DIAGNOSIS — F4481 Dissociative identity disorder: Secondary | ICD-10-CM | POA: Diagnosis not present

## 2021-05-13 DIAGNOSIS — F4310 Post-traumatic stress disorder, unspecified: Secondary | ICD-10-CM | POA: Diagnosis not present

## 2021-05-13 DIAGNOSIS — F4481 Dissociative identity disorder: Secondary | ICD-10-CM | POA: Diagnosis not present

## 2021-05-13 DIAGNOSIS — F319 Bipolar disorder, unspecified: Secondary | ICD-10-CM | POA: Diagnosis not present

## 2021-05-13 DIAGNOSIS — F1020 Alcohol dependence, uncomplicated: Secondary | ICD-10-CM | POA: Diagnosis not present

## 2021-05-13 DIAGNOSIS — F17200 Nicotine dependence, unspecified, uncomplicated: Secondary | ICD-10-CM | POA: Diagnosis not present

## 2021-05-14 DIAGNOSIS — F319 Bipolar disorder, unspecified: Secondary | ICD-10-CM | POA: Diagnosis not present

## 2021-05-14 DIAGNOSIS — F4481 Dissociative identity disorder: Secondary | ICD-10-CM | POA: Diagnosis not present

## 2021-05-14 DIAGNOSIS — F17200 Nicotine dependence, unspecified, uncomplicated: Secondary | ICD-10-CM | POA: Diagnosis not present

## 2021-05-14 DIAGNOSIS — F4310 Post-traumatic stress disorder, unspecified: Secondary | ICD-10-CM | POA: Diagnosis not present

## 2021-05-14 DIAGNOSIS — F1020 Alcohol dependence, uncomplicated: Secondary | ICD-10-CM | POA: Diagnosis not present

## 2021-05-15 DIAGNOSIS — F1020 Alcohol dependence, uncomplicated: Secondary | ICD-10-CM | POA: Diagnosis not present

## 2021-05-15 DIAGNOSIS — F17200 Nicotine dependence, unspecified, uncomplicated: Secondary | ICD-10-CM | POA: Diagnosis not present

## 2021-05-15 DIAGNOSIS — F4310 Post-traumatic stress disorder, unspecified: Secondary | ICD-10-CM | POA: Diagnosis not present

## 2021-05-15 DIAGNOSIS — F319 Bipolar disorder, unspecified: Secondary | ICD-10-CM | POA: Diagnosis not present

## 2021-05-15 DIAGNOSIS — F4481 Dissociative identity disorder: Secondary | ICD-10-CM | POA: Diagnosis not present

## 2021-05-16 DIAGNOSIS — F17200 Nicotine dependence, unspecified, uncomplicated: Secondary | ICD-10-CM | POA: Diagnosis not present

## 2021-05-16 DIAGNOSIS — F4310 Post-traumatic stress disorder, unspecified: Secondary | ICD-10-CM | POA: Diagnosis not present

## 2021-05-16 DIAGNOSIS — F1020 Alcohol dependence, uncomplicated: Secondary | ICD-10-CM | POA: Diagnosis not present

## 2021-05-16 DIAGNOSIS — F4481 Dissociative identity disorder: Secondary | ICD-10-CM | POA: Diagnosis not present

## 2021-05-16 DIAGNOSIS — F319 Bipolar disorder, unspecified: Secondary | ICD-10-CM | POA: Diagnosis not present

## 2021-05-17 DIAGNOSIS — F17200 Nicotine dependence, unspecified, uncomplicated: Secondary | ICD-10-CM | POA: Diagnosis not present

## 2021-05-17 DIAGNOSIS — F319 Bipolar disorder, unspecified: Secondary | ICD-10-CM | POA: Diagnosis not present

## 2021-05-17 DIAGNOSIS — F4310 Post-traumatic stress disorder, unspecified: Secondary | ICD-10-CM | POA: Diagnosis not present

## 2021-05-17 DIAGNOSIS — F4481 Dissociative identity disorder: Secondary | ICD-10-CM | POA: Diagnosis not present

## 2021-05-17 DIAGNOSIS — F1020 Alcohol dependence, uncomplicated: Secondary | ICD-10-CM | POA: Diagnosis not present

## 2021-05-18 DIAGNOSIS — F17200 Nicotine dependence, unspecified, uncomplicated: Secondary | ICD-10-CM | POA: Diagnosis not present

## 2021-05-18 DIAGNOSIS — F319 Bipolar disorder, unspecified: Secondary | ICD-10-CM | POA: Diagnosis not present

## 2021-05-18 DIAGNOSIS — F1020 Alcohol dependence, uncomplicated: Secondary | ICD-10-CM | POA: Diagnosis not present

## 2021-05-18 DIAGNOSIS — F4310 Post-traumatic stress disorder, unspecified: Secondary | ICD-10-CM | POA: Diagnosis not present

## 2021-05-18 DIAGNOSIS — F4481 Dissociative identity disorder: Secondary | ICD-10-CM | POA: Diagnosis not present

## 2021-05-19 DIAGNOSIS — F1020 Alcohol dependence, uncomplicated: Secondary | ICD-10-CM | POA: Diagnosis not present

## 2021-05-19 DIAGNOSIS — F17200 Nicotine dependence, unspecified, uncomplicated: Secondary | ICD-10-CM | POA: Diagnosis not present

## 2021-05-19 DIAGNOSIS — F319 Bipolar disorder, unspecified: Secondary | ICD-10-CM | POA: Diagnosis not present

## 2021-05-19 DIAGNOSIS — F4481 Dissociative identity disorder: Secondary | ICD-10-CM | POA: Diagnosis not present

## 2021-05-19 DIAGNOSIS — F4310 Post-traumatic stress disorder, unspecified: Secondary | ICD-10-CM | POA: Diagnosis not present

## 2021-05-20 DIAGNOSIS — F17200 Nicotine dependence, unspecified, uncomplicated: Secondary | ICD-10-CM | POA: Diagnosis not present

## 2021-05-20 DIAGNOSIS — F1020 Alcohol dependence, uncomplicated: Secondary | ICD-10-CM | POA: Diagnosis not present

## 2021-05-20 DIAGNOSIS — F4481 Dissociative identity disorder: Secondary | ICD-10-CM | POA: Diagnosis not present

## 2021-05-20 DIAGNOSIS — F4310 Post-traumatic stress disorder, unspecified: Secondary | ICD-10-CM | POA: Diagnosis not present

## 2021-05-20 DIAGNOSIS — F319 Bipolar disorder, unspecified: Secondary | ICD-10-CM | POA: Diagnosis not present

## 2021-05-21 DIAGNOSIS — F1020 Alcohol dependence, uncomplicated: Secondary | ICD-10-CM | POA: Diagnosis not present

## 2021-05-21 DIAGNOSIS — F319 Bipolar disorder, unspecified: Secondary | ICD-10-CM | POA: Diagnosis not present

## 2021-05-21 DIAGNOSIS — F4310 Post-traumatic stress disorder, unspecified: Secondary | ICD-10-CM | POA: Diagnosis not present

## 2021-05-21 DIAGNOSIS — F17200 Nicotine dependence, unspecified, uncomplicated: Secondary | ICD-10-CM | POA: Diagnosis not present

## 2021-05-21 DIAGNOSIS — F4481 Dissociative identity disorder: Secondary | ICD-10-CM | POA: Diagnosis not present

## 2021-05-22 DIAGNOSIS — F319 Bipolar disorder, unspecified: Secondary | ICD-10-CM | POA: Diagnosis not present

## 2021-05-22 DIAGNOSIS — F1020 Alcohol dependence, uncomplicated: Secondary | ICD-10-CM | POA: Diagnosis not present

## 2021-05-22 DIAGNOSIS — F17200 Nicotine dependence, unspecified, uncomplicated: Secondary | ICD-10-CM | POA: Diagnosis not present

## 2021-05-22 DIAGNOSIS — F4310 Post-traumatic stress disorder, unspecified: Secondary | ICD-10-CM | POA: Diagnosis not present

## 2021-05-22 DIAGNOSIS — F4481 Dissociative identity disorder: Secondary | ICD-10-CM | POA: Diagnosis not present

## 2021-05-23 DIAGNOSIS — F1020 Alcohol dependence, uncomplicated: Secondary | ICD-10-CM | POA: Diagnosis not present

## 2021-05-23 DIAGNOSIS — F319 Bipolar disorder, unspecified: Secondary | ICD-10-CM | POA: Diagnosis not present

## 2021-05-23 DIAGNOSIS — F4481 Dissociative identity disorder: Secondary | ICD-10-CM | POA: Diagnosis not present

## 2021-05-23 DIAGNOSIS — F17200 Nicotine dependence, unspecified, uncomplicated: Secondary | ICD-10-CM | POA: Diagnosis not present

## 2021-05-23 DIAGNOSIS — F4310 Post-traumatic stress disorder, unspecified: Secondary | ICD-10-CM | POA: Diagnosis not present

## 2021-05-24 DIAGNOSIS — F17200 Nicotine dependence, unspecified, uncomplicated: Secondary | ICD-10-CM | POA: Diagnosis not present

## 2021-05-24 DIAGNOSIS — F4481 Dissociative identity disorder: Secondary | ICD-10-CM | POA: Diagnosis not present

## 2021-05-24 DIAGNOSIS — F1020 Alcohol dependence, uncomplicated: Secondary | ICD-10-CM | POA: Diagnosis not present

## 2021-05-24 DIAGNOSIS — F4310 Post-traumatic stress disorder, unspecified: Secondary | ICD-10-CM | POA: Diagnosis not present

## 2021-05-24 DIAGNOSIS — F319 Bipolar disorder, unspecified: Secondary | ICD-10-CM | POA: Diagnosis not present

## 2021-05-25 DIAGNOSIS — F17200 Nicotine dependence, unspecified, uncomplicated: Secondary | ICD-10-CM | POA: Diagnosis not present

## 2021-05-25 DIAGNOSIS — F1020 Alcohol dependence, uncomplicated: Secondary | ICD-10-CM | POA: Diagnosis not present

## 2021-05-25 DIAGNOSIS — F4310 Post-traumatic stress disorder, unspecified: Secondary | ICD-10-CM | POA: Diagnosis not present

## 2021-05-25 DIAGNOSIS — F319 Bipolar disorder, unspecified: Secondary | ICD-10-CM | POA: Diagnosis not present

## 2021-05-25 DIAGNOSIS — F4481 Dissociative identity disorder: Secondary | ICD-10-CM | POA: Diagnosis not present

## 2021-05-26 DIAGNOSIS — F319 Bipolar disorder, unspecified: Secondary | ICD-10-CM | POA: Diagnosis not present

## 2021-05-26 DIAGNOSIS — F1020 Alcohol dependence, uncomplicated: Secondary | ICD-10-CM | POA: Diagnosis not present

## 2021-05-26 DIAGNOSIS — F17200 Nicotine dependence, unspecified, uncomplicated: Secondary | ICD-10-CM | POA: Diagnosis not present

## 2021-05-26 DIAGNOSIS — F4310 Post-traumatic stress disorder, unspecified: Secondary | ICD-10-CM | POA: Diagnosis not present

## 2021-05-26 DIAGNOSIS — F4481 Dissociative identity disorder: Secondary | ICD-10-CM | POA: Diagnosis not present

## 2021-05-27 DIAGNOSIS — F17200 Nicotine dependence, unspecified, uncomplicated: Secondary | ICD-10-CM | POA: Diagnosis not present

## 2021-05-27 DIAGNOSIS — F4310 Post-traumatic stress disorder, unspecified: Secondary | ICD-10-CM | POA: Diagnosis not present

## 2021-05-27 DIAGNOSIS — F1020 Alcohol dependence, uncomplicated: Secondary | ICD-10-CM | POA: Diagnosis not present

## 2021-05-27 DIAGNOSIS — F4481 Dissociative identity disorder: Secondary | ICD-10-CM | POA: Diagnosis not present

## 2021-05-27 DIAGNOSIS — F319 Bipolar disorder, unspecified: Secondary | ICD-10-CM | POA: Diagnosis not present

## 2021-05-28 DIAGNOSIS — F4310 Post-traumatic stress disorder, unspecified: Secondary | ICD-10-CM | POA: Diagnosis not present

## 2021-05-28 DIAGNOSIS — F319 Bipolar disorder, unspecified: Secondary | ICD-10-CM | POA: Diagnosis not present

## 2021-05-28 DIAGNOSIS — F4481 Dissociative identity disorder: Secondary | ICD-10-CM | POA: Diagnosis not present

## 2021-05-28 DIAGNOSIS — F17200 Nicotine dependence, unspecified, uncomplicated: Secondary | ICD-10-CM | POA: Diagnosis not present

## 2021-05-28 DIAGNOSIS — F1020 Alcohol dependence, uncomplicated: Secondary | ICD-10-CM | POA: Diagnosis not present

## 2021-05-29 DIAGNOSIS — F4310 Post-traumatic stress disorder, unspecified: Secondary | ICD-10-CM | POA: Diagnosis not present

## 2021-05-29 DIAGNOSIS — F1020 Alcohol dependence, uncomplicated: Secondary | ICD-10-CM | POA: Diagnosis not present

## 2021-05-29 DIAGNOSIS — F17200 Nicotine dependence, unspecified, uncomplicated: Secondary | ICD-10-CM | POA: Diagnosis not present

## 2021-05-29 DIAGNOSIS — F4481 Dissociative identity disorder: Secondary | ICD-10-CM | POA: Diagnosis not present

## 2021-05-29 DIAGNOSIS — F319 Bipolar disorder, unspecified: Secondary | ICD-10-CM | POA: Diagnosis not present

## 2021-05-30 ENCOUNTER — Encounter: Payer: PPO | Admitting: Internal Medicine

## 2021-05-30 DIAGNOSIS — F319 Bipolar disorder, unspecified: Secondary | ICD-10-CM | POA: Diagnosis not present

## 2021-05-30 DIAGNOSIS — F1020 Alcohol dependence, uncomplicated: Secondary | ICD-10-CM | POA: Diagnosis not present

## 2021-05-30 DIAGNOSIS — F17200 Nicotine dependence, unspecified, uncomplicated: Secondary | ICD-10-CM | POA: Diagnosis not present

## 2021-05-30 DIAGNOSIS — F4310 Post-traumatic stress disorder, unspecified: Secondary | ICD-10-CM | POA: Diagnosis not present

## 2021-05-30 DIAGNOSIS — F4481 Dissociative identity disorder: Secondary | ICD-10-CM | POA: Diagnosis not present

## 2021-05-31 DIAGNOSIS — F4310 Post-traumatic stress disorder, unspecified: Secondary | ICD-10-CM | POA: Diagnosis not present

## 2021-05-31 DIAGNOSIS — F4481 Dissociative identity disorder: Secondary | ICD-10-CM | POA: Diagnosis not present

## 2021-05-31 DIAGNOSIS — F17200 Nicotine dependence, unspecified, uncomplicated: Secondary | ICD-10-CM | POA: Diagnosis not present

## 2021-05-31 DIAGNOSIS — F319 Bipolar disorder, unspecified: Secondary | ICD-10-CM | POA: Diagnosis not present

## 2021-05-31 DIAGNOSIS — F1020 Alcohol dependence, uncomplicated: Secondary | ICD-10-CM | POA: Diagnosis not present

## 2021-06-01 DIAGNOSIS — F4310 Post-traumatic stress disorder, unspecified: Secondary | ICD-10-CM | POA: Diagnosis not present

## 2021-06-01 DIAGNOSIS — F319 Bipolar disorder, unspecified: Secondary | ICD-10-CM | POA: Diagnosis not present

## 2021-06-01 DIAGNOSIS — F4481 Dissociative identity disorder: Secondary | ICD-10-CM | POA: Diagnosis not present

## 2021-06-01 DIAGNOSIS — F1020 Alcohol dependence, uncomplicated: Secondary | ICD-10-CM | POA: Diagnosis not present

## 2021-06-01 DIAGNOSIS — F17200 Nicotine dependence, unspecified, uncomplicated: Secondary | ICD-10-CM | POA: Diagnosis not present

## 2021-06-02 DIAGNOSIS — F4310 Post-traumatic stress disorder, unspecified: Secondary | ICD-10-CM | POA: Diagnosis not present

## 2021-06-02 DIAGNOSIS — F4481 Dissociative identity disorder: Secondary | ICD-10-CM | POA: Diagnosis not present

## 2021-06-02 DIAGNOSIS — F1020 Alcohol dependence, uncomplicated: Secondary | ICD-10-CM | POA: Diagnosis not present

## 2021-06-02 DIAGNOSIS — F17200 Nicotine dependence, unspecified, uncomplicated: Secondary | ICD-10-CM | POA: Diagnosis not present

## 2021-06-02 DIAGNOSIS — F319 Bipolar disorder, unspecified: Secondary | ICD-10-CM | POA: Diagnosis not present

## 2021-06-03 DIAGNOSIS — F319 Bipolar disorder, unspecified: Secondary | ICD-10-CM | POA: Diagnosis not present

## 2021-06-03 DIAGNOSIS — F4481 Dissociative identity disorder: Secondary | ICD-10-CM | POA: Diagnosis not present

## 2021-06-03 DIAGNOSIS — F1020 Alcohol dependence, uncomplicated: Secondary | ICD-10-CM | POA: Diagnosis not present

## 2021-06-03 DIAGNOSIS — F17200 Nicotine dependence, unspecified, uncomplicated: Secondary | ICD-10-CM | POA: Diagnosis not present

## 2021-06-03 DIAGNOSIS — F4310 Post-traumatic stress disorder, unspecified: Secondary | ICD-10-CM | POA: Diagnosis not present

## 2021-06-04 DIAGNOSIS — F1020 Alcohol dependence, uncomplicated: Secondary | ICD-10-CM | POA: Diagnosis not present

## 2021-06-04 DIAGNOSIS — F17200 Nicotine dependence, unspecified, uncomplicated: Secondary | ICD-10-CM | POA: Diagnosis not present

## 2021-06-04 DIAGNOSIS — F4481 Dissociative identity disorder: Secondary | ICD-10-CM | POA: Diagnosis not present

## 2021-06-04 DIAGNOSIS — F4310 Post-traumatic stress disorder, unspecified: Secondary | ICD-10-CM | POA: Diagnosis not present

## 2021-06-04 DIAGNOSIS — F319 Bipolar disorder, unspecified: Secondary | ICD-10-CM | POA: Diagnosis not present

## 2021-06-05 DIAGNOSIS — F4481 Dissociative identity disorder: Secondary | ICD-10-CM | POA: Diagnosis not present

## 2021-06-05 DIAGNOSIS — F319 Bipolar disorder, unspecified: Secondary | ICD-10-CM | POA: Diagnosis not present

## 2021-06-05 DIAGNOSIS — F1020 Alcohol dependence, uncomplicated: Secondary | ICD-10-CM | POA: Diagnosis not present

## 2021-06-05 DIAGNOSIS — F17200 Nicotine dependence, unspecified, uncomplicated: Secondary | ICD-10-CM | POA: Diagnosis not present

## 2021-06-05 DIAGNOSIS — F4310 Post-traumatic stress disorder, unspecified: Secondary | ICD-10-CM | POA: Diagnosis not present

## 2021-06-06 DIAGNOSIS — F319 Bipolar disorder, unspecified: Secondary | ICD-10-CM | POA: Diagnosis not present

## 2021-06-06 DIAGNOSIS — F4481 Dissociative identity disorder: Secondary | ICD-10-CM | POA: Diagnosis not present

## 2021-06-06 DIAGNOSIS — F17200 Nicotine dependence, unspecified, uncomplicated: Secondary | ICD-10-CM | POA: Diagnosis not present

## 2021-06-06 DIAGNOSIS — F1020 Alcohol dependence, uncomplicated: Secondary | ICD-10-CM | POA: Diagnosis not present

## 2021-06-06 DIAGNOSIS — F4310 Post-traumatic stress disorder, unspecified: Secondary | ICD-10-CM | POA: Diagnosis not present

## 2021-06-07 DIAGNOSIS — F4310 Post-traumatic stress disorder, unspecified: Secondary | ICD-10-CM | POA: Diagnosis not present

## 2021-06-07 DIAGNOSIS — F319 Bipolar disorder, unspecified: Secondary | ICD-10-CM | POA: Diagnosis not present

## 2021-06-07 DIAGNOSIS — F4481 Dissociative identity disorder: Secondary | ICD-10-CM | POA: Diagnosis not present

## 2021-06-07 DIAGNOSIS — F17200 Nicotine dependence, unspecified, uncomplicated: Secondary | ICD-10-CM | POA: Diagnosis not present

## 2021-06-07 DIAGNOSIS — F1020 Alcohol dependence, uncomplicated: Secondary | ICD-10-CM | POA: Diagnosis not present

## 2021-06-08 DIAGNOSIS — F319 Bipolar disorder, unspecified: Secondary | ICD-10-CM | POA: Diagnosis not present

## 2021-06-08 DIAGNOSIS — F4310 Post-traumatic stress disorder, unspecified: Secondary | ICD-10-CM | POA: Diagnosis not present

## 2021-06-08 DIAGNOSIS — F17200 Nicotine dependence, unspecified, uncomplicated: Secondary | ICD-10-CM | POA: Diagnosis not present

## 2021-06-08 DIAGNOSIS — F4481 Dissociative identity disorder: Secondary | ICD-10-CM | POA: Diagnosis not present

## 2021-06-08 DIAGNOSIS — F1020 Alcohol dependence, uncomplicated: Secondary | ICD-10-CM | POA: Diagnosis not present

## 2021-06-12 DIAGNOSIS — F4310 Post-traumatic stress disorder, unspecified: Secondary | ICD-10-CM | POA: Diagnosis not present

## 2021-06-12 DIAGNOSIS — F4481 Dissociative identity disorder: Secondary | ICD-10-CM | POA: Diagnosis not present

## 2021-06-13 DIAGNOSIS — Z79899 Other long term (current) drug therapy: Secondary | ICD-10-CM | POA: Diagnosis not present

## 2021-06-14 DIAGNOSIS — F4481 Dissociative identity disorder: Secondary | ICD-10-CM | POA: Diagnosis not present

## 2021-06-14 DIAGNOSIS — F4310 Post-traumatic stress disorder, unspecified: Secondary | ICD-10-CM | POA: Diagnosis not present

## 2021-06-20 DIAGNOSIS — F4481 Dissociative identity disorder: Secondary | ICD-10-CM | POA: Diagnosis not present

## 2021-06-20 DIAGNOSIS — F4310 Post-traumatic stress disorder, unspecified: Secondary | ICD-10-CM | POA: Diagnosis not present

## 2021-06-21 DIAGNOSIS — F419 Anxiety disorder, unspecified: Secondary | ICD-10-CM | POA: Diagnosis not present

## 2021-06-21 DIAGNOSIS — F32A Depression, unspecified: Secondary | ICD-10-CM | POA: Diagnosis not present

## 2021-06-21 DIAGNOSIS — H6693 Otitis media, unspecified, bilateral: Secondary | ICD-10-CM | POA: Diagnosis not present

## 2021-06-21 DIAGNOSIS — I10 Essential (primary) hypertension: Secondary | ICD-10-CM | POA: Diagnosis not present

## 2021-06-22 DIAGNOSIS — F4310 Post-traumatic stress disorder, unspecified: Secondary | ICD-10-CM | POA: Diagnosis not present

## 2021-06-22 DIAGNOSIS — F4481 Dissociative identity disorder: Secondary | ICD-10-CM | POA: Diagnosis not present

## 2021-06-28 DIAGNOSIS — F419 Anxiety disorder, unspecified: Secondary | ICD-10-CM | POA: Diagnosis not present

## 2021-06-28 DIAGNOSIS — Z79899 Other long term (current) drug therapy: Secondary | ICD-10-CM | POA: Diagnosis not present

## 2021-06-28 DIAGNOSIS — I10 Essential (primary) hypertension: Secondary | ICD-10-CM | POA: Diagnosis not present

## 2021-06-28 DIAGNOSIS — F32A Depression, unspecified: Secondary | ICD-10-CM | POA: Diagnosis not present

## 2021-06-28 DIAGNOSIS — Z125 Encounter for screening for malignant neoplasm of prostate: Secondary | ICD-10-CM | POA: Diagnosis not present

## 2021-06-29 DIAGNOSIS — F4481 Dissociative identity disorder: Secondary | ICD-10-CM | POA: Diagnosis not present

## 2021-06-29 DIAGNOSIS — F4310 Post-traumatic stress disorder, unspecified: Secondary | ICD-10-CM | POA: Diagnosis not present

## 2021-07-03 DIAGNOSIS — F4310 Post-traumatic stress disorder, unspecified: Secondary | ICD-10-CM | POA: Diagnosis not present

## 2021-07-03 DIAGNOSIS — F4481 Dissociative identity disorder: Secondary | ICD-10-CM | POA: Diagnosis not present

## 2021-07-05 DIAGNOSIS — F4481 Dissociative identity disorder: Secondary | ICD-10-CM | POA: Diagnosis not present

## 2021-07-05 DIAGNOSIS — F4310 Post-traumatic stress disorder, unspecified: Secondary | ICD-10-CM | POA: Diagnosis not present

## 2021-07-10 DIAGNOSIS — F4310 Post-traumatic stress disorder, unspecified: Secondary | ICD-10-CM | POA: Diagnosis not present

## 2021-07-10 DIAGNOSIS — F4481 Dissociative identity disorder: Secondary | ICD-10-CM | POA: Diagnosis not present

## 2021-07-12 DIAGNOSIS — F4481 Dissociative identity disorder: Secondary | ICD-10-CM | POA: Diagnosis not present

## 2021-07-12 DIAGNOSIS — F4310 Post-traumatic stress disorder, unspecified: Secondary | ICD-10-CM | POA: Diagnosis not present

## 2021-07-17 DIAGNOSIS — F4310 Post-traumatic stress disorder, unspecified: Secondary | ICD-10-CM | POA: Diagnosis not present

## 2021-07-17 DIAGNOSIS — F4481 Dissociative identity disorder: Secondary | ICD-10-CM | POA: Diagnosis not present

## 2021-07-20 DIAGNOSIS — F4481 Dissociative identity disorder: Secondary | ICD-10-CM | POA: Diagnosis not present

## 2021-07-20 DIAGNOSIS — F4310 Post-traumatic stress disorder, unspecified: Secondary | ICD-10-CM | POA: Diagnosis not present

## 2021-07-24 DIAGNOSIS — F4310 Post-traumatic stress disorder, unspecified: Secondary | ICD-10-CM | POA: Diagnosis not present

## 2021-07-24 DIAGNOSIS — F4481 Dissociative identity disorder: Secondary | ICD-10-CM | POA: Diagnosis not present

## 2021-07-27 DIAGNOSIS — F4310 Post-traumatic stress disorder, unspecified: Secondary | ICD-10-CM | POA: Diagnosis not present

## 2021-07-27 DIAGNOSIS — F4481 Dissociative identity disorder: Secondary | ICD-10-CM | POA: Diagnosis not present

## 2021-07-28 DIAGNOSIS — F4481 Dissociative identity disorder: Secondary | ICD-10-CM | POA: Diagnosis not present

## 2021-07-28 DIAGNOSIS — F4310 Post-traumatic stress disorder, unspecified: Secondary | ICD-10-CM | POA: Diagnosis not present

## 2021-07-31 DIAGNOSIS — F32A Depression, unspecified: Secondary | ICD-10-CM | POA: Diagnosis not present

## 2021-07-31 DIAGNOSIS — F4310 Post-traumatic stress disorder, unspecified: Secondary | ICD-10-CM | POA: Diagnosis not present

## 2021-07-31 DIAGNOSIS — F419 Anxiety disorder, unspecified: Secondary | ICD-10-CM | POA: Diagnosis not present

## 2021-07-31 DIAGNOSIS — F4481 Dissociative identity disorder: Secondary | ICD-10-CM | POA: Diagnosis not present

## 2021-08-21 DIAGNOSIS — H524 Presbyopia: Secondary | ICD-10-CM | POA: Diagnosis not present

## 2021-08-28 DIAGNOSIS — F4481 Dissociative identity disorder: Secondary | ICD-10-CM | POA: Diagnosis not present

## 2021-08-28 DIAGNOSIS — F4310 Post-traumatic stress disorder, unspecified: Secondary | ICD-10-CM | POA: Diagnosis not present

## 2021-09-11 DIAGNOSIS — F4481 Dissociative identity disorder: Secondary | ICD-10-CM | POA: Diagnosis not present

## 2021-09-11 DIAGNOSIS — F4310 Post-traumatic stress disorder, unspecified: Secondary | ICD-10-CM | POA: Diagnosis not present

## 2021-10-05 DIAGNOSIS — F4310 Post-traumatic stress disorder, unspecified: Secondary | ICD-10-CM | POA: Diagnosis not present

## 2021-10-05 DIAGNOSIS — F4481 Dissociative identity disorder: Secondary | ICD-10-CM | POA: Diagnosis not present

## 2021-10-16 DIAGNOSIS — F4310 Post-traumatic stress disorder, unspecified: Secondary | ICD-10-CM | POA: Diagnosis not present

## 2021-10-16 DIAGNOSIS — F4481 Dissociative identity disorder: Secondary | ICD-10-CM | POA: Diagnosis not present

## 2021-10-19 DIAGNOSIS — F4481 Dissociative identity disorder: Secondary | ICD-10-CM | POA: Diagnosis not present

## 2021-10-19 DIAGNOSIS — F4310 Post-traumatic stress disorder, unspecified: Secondary | ICD-10-CM | POA: Diagnosis not present

## 2021-10-30 DIAGNOSIS — F4310 Post-traumatic stress disorder, unspecified: Secondary | ICD-10-CM | POA: Diagnosis not present

## 2021-10-30 DIAGNOSIS — F4481 Dissociative identity disorder: Secondary | ICD-10-CM | POA: Diagnosis not present

## 2021-11-06 DIAGNOSIS — F4481 Dissociative identity disorder: Secondary | ICD-10-CM | POA: Diagnosis not present

## 2021-11-06 DIAGNOSIS — F4310 Post-traumatic stress disorder, unspecified: Secondary | ICD-10-CM | POA: Diagnosis not present

## 2021-11-13 DIAGNOSIS — F4481 Dissociative identity disorder: Secondary | ICD-10-CM | POA: Diagnosis not present

## 2021-11-13 DIAGNOSIS — F4310 Post-traumatic stress disorder, unspecified: Secondary | ICD-10-CM | POA: Diagnosis not present

## 2021-11-16 DIAGNOSIS — F4310 Post-traumatic stress disorder, unspecified: Secondary | ICD-10-CM | POA: Diagnosis not present

## 2021-11-16 DIAGNOSIS — F4481 Dissociative identity disorder: Secondary | ICD-10-CM | POA: Diagnosis not present

## 2021-11-22 DIAGNOSIS — F4310 Post-traumatic stress disorder, unspecified: Secondary | ICD-10-CM | POA: Diagnosis not present

## 2021-11-22 DIAGNOSIS — F4481 Dissociative identity disorder: Secondary | ICD-10-CM | POA: Diagnosis not present

## 2021-11-27 DIAGNOSIS — F4310 Post-traumatic stress disorder, unspecified: Secondary | ICD-10-CM | POA: Diagnosis not present

## 2021-11-27 DIAGNOSIS — F4481 Dissociative identity disorder: Secondary | ICD-10-CM | POA: Diagnosis not present

## 2021-11-28 DIAGNOSIS — F4481 Dissociative identity disorder: Secondary | ICD-10-CM | POA: Diagnosis not present

## 2021-11-28 DIAGNOSIS — F4310 Post-traumatic stress disorder, unspecified: Secondary | ICD-10-CM | POA: Diagnosis not present

## 2021-11-29 DIAGNOSIS — F4481 Dissociative identity disorder: Secondary | ICD-10-CM | POA: Diagnosis not present

## 2021-11-29 DIAGNOSIS — F4310 Post-traumatic stress disorder, unspecified: Secondary | ICD-10-CM | POA: Diagnosis not present

## 2021-11-30 DIAGNOSIS — F4481 Dissociative identity disorder: Secondary | ICD-10-CM | POA: Diagnosis not present

## 2021-11-30 DIAGNOSIS — F4310 Post-traumatic stress disorder, unspecified: Secondary | ICD-10-CM | POA: Diagnosis not present

## 2021-12-11 DIAGNOSIS — F4310 Post-traumatic stress disorder, unspecified: Secondary | ICD-10-CM | POA: Diagnosis not present

## 2021-12-11 DIAGNOSIS — F4481 Dissociative identity disorder: Secondary | ICD-10-CM | POA: Diagnosis not present

## 2021-12-19 DIAGNOSIS — F4310 Post-traumatic stress disorder, unspecified: Secondary | ICD-10-CM | POA: Diagnosis not present

## 2021-12-19 DIAGNOSIS — F4481 Dissociative identity disorder: Secondary | ICD-10-CM | POA: Diagnosis not present

## 2021-12-25 DIAGNOSIS — F4481 Dissociative identity disorder: Secondary | ICD-10-CM | POA: Diagnosis not present

## 2021-12-25 DIAGNOSIS — F4310 Post-traumatic stress disorder, unspecified: Secondary | ICD-10-CM | POA: Diagnosis not present

## 2021-12-26 DIAGNOSIS — F4310 Post-traumatic stress disorder, unspecified: Secondary | ICD-10-CM | POA: Diagnosis not present

## 2021-12-26 DIAGNOSIS — F4481 Dissociative identity disorder: Secondary | ICD-10-CM | POA: Diagnosis not present

## 2021-12-27 DIAGNOSIS — F4310 Post-traumatic stress disorder, unspecified: Secondary | ICD-10-CM | POA: Diagnosis not present

## 2021-12-27 DIAGNOSIS — F4481 Dissociative identity disorder: Secondary | ICD-10-CM | POA: Diagnosis not present

## 2021-12-28 DIAGNOSIS — F4481 Dissociative identity disorder: Secondary | ICD-10-CM | POA: Diagnosis not present

## 2021-12-28 DIAGNOSIS — F4310 Post-traumatic stress disorder, unspecified: Secondary | ICD-10-CM | POA: Diagnosis not present

## 2022-01-02 DIAGNOSIS — F4310 Post-traumatic stress disorder, unspecified: Secondary | ICD-10-CM | POA: Diagnosis not present

## 2022-01-02 DIAGNOSIS — F4481 Dissociative identity disorder: Secondary | ICD-10-CM | POA: Diagnosis not present

## 2022-01-03 DIAGNOSIS — F4310 Post-traumatic stress disorder, unspecified: Secondary | ICD-10-CM | POA: Diagnosis not present

## 2022-01-03 DIAGNOSIS — F4481 Dissociative identity disorder: Secondary | ICD-10-CM | POA: Diagnosis not present

## 2022-01-08 DIAGNOSIS — F4310 Post-traumatic stress disorder, unspecified: Secondary | ICD-10-CM | POA: Diagnosis not present

## 2022-01-08 DIAGNOSIS — F4481 Dissociative identity disorder: Secondary | ICD-10-CM | POA: Diagnosis not present

## 2022-01-09 DIAGNOSIS — F4310 Post-traumatic stress disorder, unspecified: Secondary | ICD-10-CM | POA: Diagnosis not present

## 2022-01-09 DIAGNOSIS — F4481 Dissociative identity disorder: Secondary | ICD-10-CM | POA: Diagnosis not present

## 2022-02-01 DIAGNOSIS — F4481 Dissociative identity disorder: Secondary | ICD-10-CM | POA: Diagnosis not present

## 2022-02-01 DIAGNOSIS — F4310 Post-traumatic stress disorder, unspecified: Secondary | ICD-10-CM | POA: Diagnosis not present

## 2022-02-05 DIAGNOSIS — F4481 Dissociative identity disorder: Secondary | ICD-10-CM | POA: Diagnosis not present

## 2022-02-05 DIAGNOSIS — F4310 Post-traumatic stress disorder, unspecified: Secondary | ICD-10-CM | POA: Diagnosis not present

## 2022-02-07 DIAGNOSIS — F4310 Post-traumatic stress disorder, unspecified: Secondary | ICD-10-CM | POA: Diagnosis not present

## 2022-02-07 DIAGNOSIS — F4481 Dissociative identity disorder: Secondary | ICD-10-CM | POA: Diagnosis not present

## 2022-02-12 DIAGNOSIS — F4481 Dissociative identity disorder: Secondary | ICD-10-CM | POA: Diagnosis not present

## 2022-02-12 DIAGNOSIS — F4310 Post-traumatic stress disorder, unspecified: Secondary | ICD-10-CM | POA: Diagnosis not present

## 2022-02-14 DIAGNOSIS — F4481 Dissociative identity disorder: Secondary | ICD-10-CM | POA: Diagnosis not present

## 2022-02-14 DIAGNOSIS — F4310 Post-traumatic stress disorder, unspecified: Secondary | ICD-10-CM | POA: Diagnosis not present

## 2022-02-20 DIAGNOSIS — F4481 Dissociative identity disorder: Secondary | ICD-10-CM | POA: Diagnosis not present

## 2022-02-20 DIAGNOSIS — F4310 Post-traumatic stress disorder, unspecified: Secondary | ICD-10-CM | POA: Diagnosis not present

## 2022-02-27 DIAGNOSIS — F4481 Dissociative identity disorder: Secondary | ICD-10-CM | POA: Diagnosis not present

## 2022-02-27 DIAGNOSIS — F4310 Post-traumatic stress disorder, unspecified: Secondary | ICD-10-CM | POA: Diagnosis not present

## 2022-02-28 DIAGNOSIS — F4310 Post-traumatic stress disorder, unspecified: Secondary | ICD-10-CM | POA: Diagnosis not present

## 2022-03-01 DIAGNOSIS — F4310 Post-traumatic stress disorder, unspecified: Secondary | ICD-10-CM | POA: Diagnosis not present

## 2022-03-06 DIAGNOSIS — F4310 Post-traumatic stress disorder, unspecified: Secondary | ICD-10-CM | POA: Diagnosis not present

## 2022-03-08 DIAGNOSIS — F4310 Post-traumatic stress disorder, unspecified: Secondary | ICD-10-CM | POA: Diagnosis not present

## 2022-03-12 DIAGNOSIS — F4310 Post-traumatic stress disorder, unspecified: Secondary | ICD-10-CM | POA: Diagnosis not present

## 2022-03-13 DIAGNOSIS — F4481 Dissociative identity disorder: Secondary | ICD-10-CM | POA: Diagnosis not present

## 2022-03-13 DIAGNOSIS — F4310 Post-traumatic stress disorder, unspecified: Secondary | ICD-10-CM | POA: Diagnosis not present

## 2022-03-19 DIAGNOSIS — F4310 Post-traumatic stress disorder, unspecified: Secondary | ICD-10-CM | POA: Diagnosis not present

## 2022-03-19 DIAGNOSIS — F4481 Dissociative identity disorder: Secondary | ICD-10-CM | POA: Diagnosis not present

## 2022-04-02 DIAGNOSIS — F4310 Post-traumatic stress disorder, unspecified: Secondary | ICD-10-CM | POA: Diagnosis not present

## 2022-04-02 DIAGNOSIS — F4481 Dissociative identity disorder: Secondary | ICD-10-CM | POA: Diagnosis not present

## 2022-04-16 DIAGNOSIS — F4310 Post-traumatic stress disorder, unspecified: Secondary | ICD-10-CM | POA: Diagnosis not present

## 2022-04-16 DIAGNOSIS — F4481 Dissociative identity disorder: Secondary | ICD-10-CM | POA: Diagnosis not present

## 2022-04-25 DIAGNOSIS — F4310 Post-traumatic stress disorder, unspecified: Secondary | ICD-10-CM | POA: Diagnosis not present

## 2022-04-25 DIAGNOSIS — F4481 Dissociative identity disorder: Secondary | ICD-10-CM | POA: Diagnosis not present

## 2022-04-26 DIAGNOSIS — F4310 Post-traumatic stress disorder, unspecified: Secondary | ICD-10-CM | POA: Diagnosis not present

## 2022-04-26 DIAGNOSIS — F4481 Dissociative identity disorder: Secondary | ICD-10-CM | POA: Diagnosis not present

## 2022-04-28 DIAGNOSIS — F4310 Post-traumatic stress disorder, unspecified: Secondary | ICD-10-CM | POA: Diagnosis not present

## 2022-04-28 DIAGNOSIS — F4481 Dissociative identity disorder: Secondary | ICD-10-CM | POA: Diagnosis not present

## 2022-05-03 DIAGNOSIS — F4310 Post-traumatic stress disorder, unspecified: Secondary | ICD-10-CM | POA: Diagnosis not present

## 2022-05-03 DIAGNOSIS — F4481 Dissociative identity disorder: Secondary | ICD-10-CM | POA: Diagnosis not present

## 2022-05-09 DIAGNOSIS — F4481 Dissociative identity disorder: Secondary | ICD-10-CM | POA: Diagnosis not present

## 2022-05-09 DIAGNOSIS — F4310 Post-traumatic stress disorder, unspecified: Secondary | ICD-10-CM | POA: Diagnosis not present

## 2022-05-21 DIAGNOSIS — F4481 Dissociative identity disorder: Secondary | ICD-10-CM | POA: Diagnosis not present

## 2022-05-21 DIAGNOSIS — F4310 Post-traumatic stress disorder, unspecified: Secondary | ICD-10-CM | POA: Diagnosis not present

## 2022-05-28 DIAGNOSIS — F4481 Dissociative identity disorder: Secondary | ICD-10-CM | POA: Diagnosis not present

## 2022-05-28 DIAGNOSIS — F4310 Post-traumatic stress disorder, unspecified: Secondary | ICD-10-CM | POA: Diagnosis not present

## 2022-06-04 DIAGNOSIS — F4481 Dissociative identity disorder: Secondary | ICD-10-CM | POA: Diagnosis not present

## 2022-06-04 DIAGNOSIS — F4310 Post-traumatic stress disorder, unspecified: Secondary | ICD-10-CM | POA: Diagnosis not present

## 2022-06-07 DIAGNOSIS — F4481 Dissociative identity disorder: Secondary | ICD-10-CM | POA: Diagnosis not present

## 2022-06-07 DIAGNOSIS — F4310 Post-traumatic stress disorder, unspecified: Secondary | ICD-10-CM | POA: Diagnosis not present

## 2022-06-08 ENCOUNTER — Ambulatory Visit
Admission: EM | Admit: 2022-06-08 | Discharge: 2022-06-08 | Disposition: A | Discharge status: Home / Self Care | Payer: Medicare Other | Attending: Physician Assistant | Admitting: Physician Assistant

## 2022-06-08 ENCOUNTER — Other Ambulatory Visit: Payer: Self-pay

## 2022-06-08 ENCOUNTER — Encounter: Payer: Self-pay | Admitting: Emergency Medicine

## 2022-06-08 DIAGNOSIS — S61412A Laceration without foreign body of left hand, initial encounter: Secondary | ICD-10-CM | POA: Diagnosis not present

## 2022-06-08 DIAGNOSIS — Z23 Encounter for immunization: Secondary | ICD-10-CM | POA: Diagnosis not present

## 2022-06-08 MED ORDER — TETANUS-DIPHTH-ACELL PERTUSSIS 5-2.5-18.5 LF-MCG/0.5 IM SUSY
0.5000 mL | PREFILLED_SYRINGE | Freq: Once | INTRAMUSCULAR | Status: AC
  Administered 2022-06-08: 0.5 mL via INTRAMUSCULAR

## 2022-06-08 NOTE — ED Notes (Signed)
Site dressed by CMA. Site management and infection prevention education provided. Pt verbalized understanding.

## 2022-06-08 NOTE — ED Provider Notes (Signed)
[facility]    CSN: [id number] Arrival date & time: 06/08/22  1312      History   Chief Complaint Chief Complaint  Patient presents with   Hand Injury    HPI Malik Edwards is a 47 y.o. male.   Patient reports he was using a chisel and slipped puncturing his left hand.  Patient reports injury occurred approximately 30 minutes ago.  Patient denies any numbness or tingling he is able to move all of his fingers.  Patient is unsure of the date of his last tetanus shot  The history is provided by the patient.  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   Rotator cuff dysfunction, right 01/12/2019   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   Routine general medical examination at a health care facility 07/01/2018   Alcohol use disorder, severe, dependence (HCC) 06/14/2018   Hypomagnesemia 12/20/2017   Severe obesity (BMI 35.0-35.9 with comorbidity) (HCC) 09/01/2016   PTSD (post-traumatic stress disorder) 01/13/2015   MDD (major depressive disorder), recurrent severe, 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   OSA (obstructive sleep apnea) 01/22/2013   Tobacco use 10/15/2012   Paroxysmal atrial flutter (HCC) 09/13/2012   Cardiomyopathy- etiol uindetermined- EF 30 to 35% 09/13/2012   HTN (hypertension) 09/13/2012    Past Surgical History:  Procedure Laterality  Date   CARDIOVERSION N/A 09/15/2012   Procedure: CARDIOVERSION;  Surgeon: Chrystie Nose, MD;  Location: [facility] OR;  Service: Cardiovascular;  Laterality: N/A;   CARPAL TUNNEL RELEASE Right 04/16/2019   Procedure: CARPAL TUNNEL RELEASE;  Surgeon: Betha Loa, MD;  Location: [facility] OR;  Service: Orthopedics;  Laterality: Right;   ULNAR NERVE TRANSPOSITION Right 04/16/2019   Procedure: RIGHT ULNAR NERVE DECOMPRESSION AT WRIST, RIGHT ULNAR NERVE AT ELBOW;  Surgeon: Betha Loa, MD;  Location: [facility] OR;  Service: Orthopedics;  Laterality: Right;       Home Medications    Prior to Admission medications   Medication Sig Start Date End Date Taking? Authorizing Provider  albuterol (VENTOLIN HFA) 108 (90 Base) MCG/ACT inhaler Inhale 2 puffs into the lungs every 4 (four) hours as needed for wheezing or shortness of breath (cough, shortness of breath or wheezing.). 02/27/19   Etta Grandchild, MD  ARIPiprazole (ABILIFY) 2 MG tablet Take 2 mg by mouth daily.    [provider]  aspirin EC 81 MG EC tablet Take 2 tablets (162 mg total) by mouth daily. For heart health Patient taking differently: Take 162 mg by mouth daily.  09/01/15   Armandina Stammer I, NP  carvedilol (COREG) 25 MG tablet TAKE 2 TABLETS BY MOUTH 2 TIMES DAILY WITH A MEAL 07/25/19   Etta Grandchild, MD  Cholecalciferol (VITAMIN D) 50 MCG (2000 UT) tablet Take 2,000 Units by mouth daily.  [provider]  indapamide (LOZOL) 1.25 MG tablet TAKE 1 TABLET BY MOUTH DAILY 10/31/19   Etta Grandchild, MD  irbesartan (AVAPRO) 300 MG tablet TAKE 1 TABLET BY MOUTH EVERY DAY 10/31/19   Etta Grandchild, MD  Multiple Vitamin (MULTIVITAMIN WITH MINERALS) TABS tablet Take 1 tablet by mouth daily.    [provider]  naltrexone (DEPADE) 50 MG tablet Take 50 mg by mouth daily. 07/07/19   [provider]  REXULTI 2 MG TABS tablet Take 2 mg by mouth daily. 07/18/19   [provider]  Salicylic Acid 40 % PADS Apply 1 Act topically  daily as needed. 07/23/19   Etta Grandchild, MD  Vilazodone HCl (VIIBRYD) 40 MG TABS Take 40 mg by mouth daily.    [provider]    Family History Family History  Problem Relation Age of Onset   Healthy Mother    Healthy Father    Cancer Father        prostate cancer   Heart attack Maternal Grandmother    Heart attack Maternal Grandfather    Heart attack Paternal Grandmother    Sudden Cardiac Death Neg Hx     Social History Social History   Tobacco Use   Smoking status: Every Day    Packs/day: 0.25    Years: 20.00    Additional pack years: 0.00    Total pack years: 5.00    Types: Cigarettes   Smokeless tobacco: Never  Vaping Use   Vaping Use: Never used  Substance Use Topics   Alcohol use: Not Currently    Alcohol/week: 12.0 standard drinks of alcohol    Types: 12 Standard drinks or equivalent per week    Comment: 1/2 gallon liquor per day. drinks about a fifth about 3 times a week.   Drug use: No    Comment: denied all drug use     Allergies   Gabapentin and Statins   Review of Systems Review of Systems  All other systems reviewed and are negative.    Physical Exam Triage Vital Signs ED Triage Vitals  Enc Vitals Group     BP 06/08/22 1356 (!) 165/110     Pulse Rate 06/08/22 1356 77     Resp 06/08/22 1356 20     Temp 06/08/22 1356 98 F (36.7 C)     Temp Source 06/08/22 1356 Oral     SpO2 06/08/22 1356 94 %     Weight --      Height --      Head Circumference --      Peak Flow --      Pain Score 06/08/22 1325 1     Pain Loc --      Pain Edu? --      Excl. in GC? --    No data found.  Updated Vital Signs BP (!) 165/110 (BP Location: Right Arm) Comment: pt reprots history of same and states has pcp appt on 5/21. NAD noted.  Pulse 77   Temp 98 F (36.7 C) (Oral)   Resp 20   SpO2 94%   Visual Acuity Right Eye Distance:   Left Eye Distance:   Bilateral Distance:    Right Eye Near:   Left Eye Near:    Bilateral Near:      Physical Exam Vitals and nursing note reviewed.  Constitutional:      Appearance: He is well-developed.  HENT:     Head: Normocephalic.  Cardiovascular:  Rate and Rhythm: Normal rate.  Pulmonary:     Effort: Pulmonary effort is normal.  Abdominal:     General: There is no distension.  Musculoskeletal:     Cervical back: Normal range of motion.     Comments: 2 cm laceration left palm minimal gaping full range of motion thumb and fingers neurovascular neurosensory are intact  Skin:    General: Skin is warm.  Neurological:     General: No focal deficit present.     Mental Status: He is alert and oriented to person, place, and time.      UC Treatments / Results  Labs (all labs ordered are listed, but only abnormal results are displayed) Labs Reviewed - No data to display  EKG   Radiology No results found.  Procedures Laceration Repair  Date/Time: 06/08/2022 2:16 PM  Performed by: Elson Areas, PA-C Authorized by: Elson Areas, PA-C   Consent:    Consent obtained:  Verbal   Consent given by:  Patient   Risks, benefits, and alternatives were discussed: yes     Risks discussed:  Infection   Alternatives discussed:  Delayed treatment Universal protocol:    Procedure explained and questions answered to patient or proxy's satisfaction: yes     Immediately prior to procedure, a time out was called: yes     Patient identity confirmed:  Verbally with patient Anesthesia:    Anesthesia method:  Local infiltration   Local anesthetic:  Lidocaine 2% w/o epi Laceration details:    Location:  Hand   Hand location:  L palm   Length (cm):  3 Pre-procedure details:    Preparation:  Patient was prepped and draped in usual sterile fashion and imaging obtained to evaluate for foreign bodies Exploration:    Contaminated: no   Treatment:    Area cleansed with:  Povidone-iodine   Amount of cleaning:  Standard   Irrigation solution:  Sterile saline   Visualized  foreign bodies/material removed: no     Debridement:  None Skin repair:    Repair method:  Sutures   Suture size:  5-0   Suture material:  Prolene   Number of sutures:  6 Approximation:    Approximation:  Loose Repair type:    Repair type:  Simple Comments:     No foreign body no deep injury no vascular or sensory deficits.  Patient is advised to return in 8 days for suture removal he is given injection of tetanus.  (including critical care time)  Medications Ordered in UC Medications  Tdap (BOOSTRIX) injection 0.5 mL (0.5 mLs Intramuscular Given 06/08/22 1400)    Initial Impression / Assessment and Plan / UC Course  I have reviewed the triage vital signs and the nursing notes.  Pertinent labs & imaging results that were available during my care of the patient were reviewed by me and considered in my medical decision making (see chart for details).      Final Clinical Impressions(s) / UC Diagnoses   Final diagnoses:  Laceration of left hand, foreign body presence unspecified, initial encounter     Discharge Instructions      Suture removal in 7-8 days    ED Prescriptions   None    PDMP not reviewed this encounter. An After Visit Summary was printed and given to the patient.       Elson Areas, [place] 06/08/22 1419

## 2022-06-08 NOTE — Discharge Instructions (Signed)
Suture removal in 7-8 days  

## 2022-06-08 NOTE — ED Triage Notes (Signed)
Pt reports he was using a chizzle and it slipped and punctured his left hand about 20 mins ago.    Pts last tetnus is unknown.

## 2022-06-19 ENCOUNTER — Ambulatory Visit (INDEPENDENT_AMBULATORY_CARE_PROVIDER_SITE_OTHER): Payer: Medicare Other | Admitting: Internal Medicine

## 2022-06-19 ENCOUNTER — Ambulatory Visit (INDEPENDENT_AMBULATORY_CARE_PROVIDER_SITE_OTHER): Payer: Medicare Other

## 2022-06-19 ENCOUNTER — Encounter: Payer: Self-pay | Admitting: Internal Medicine

## 2022-06-19 VITALS — BP 160/102 | HR 90 | Temp 98.4°F | Resp 16 | Ht 68.0 in | Wt 294.0 lb

## 2022-06-19 DIAGNOSIS — Z720 Tobacco use: Secondary | ICD-10-CM

## 2022-06-19 DIAGNOSIS — R059 Cough, unspecified: Secondary | ICD-10-CM | POA: Diagnosis not present

## 2022-06-19 DIAGNOSIS — G4733 Obstructive sleep apnea (adult) (pediatric): Secondary | ICD-10-CM

## 2022-06-19 DIAGNOSIS — E785 Hyperlipidemia, unspecified: Secondary | ICD-10-CM | POA: Diagnosis not present

## 2022-06-19 DIAGNOSIS — Z8042 Family history of malignant neoplasm of prostate: Secondary | ICD-10-CM | POA: Diagnosis not present

## 2022-06-19 DIAGNOSIS — R053 Chronic cough: Secondary | ICD-10-CM

## 2022-06-19 DIAGNOSIS — I10 Essential (primary) hypertension: Secondary | ICD-10-CM | POA: Diagnosis not present

## 2022-06-19 DIAGNOSIS — Z0001 Encounter for general adult medical examination with abnormal findings: Secondary | ICD-10-CM | POA: Diagnosis not present

## 2022-06-19 DIAGNOSIS — Z1211 Encounter for screening for malignant neoplasm of colon: Secondary | ICD-10-CM

## 2022-06-19 LAB — URINALYSIS, ROUTINE W REFLEX MICROSCOPIC
Bilirubin Urine: NEGATIVE
Hgb urine dipstick: NEGATIVE
Ketones, ur: NEGATIVE
Leukocytes,Ua: NEGATIVE
Nitrite: NEGATIVE
RBC / HPF: NONE SEEN (ref 0–?)
Specific Gravity, Urine: 1.02 (ref 1.000–1.030)
Total Protein, Urine: NEGATIVE
Urine Glucose: NEGATIVE
Urobilinogen, UA: 0.2 (ref 0.0–1.0)
pH: 6.5 (ref 5.0–8.0)

## 2022-06-19 LAB — CBC WITH DIFFERENTIAL/PLATELET
Basophils Absolute: 0.1 10*3/uL (ref 0.0–0.1)
Basophils Relative: 0.9 % (ref 0.0–3.0)
Eosinophils Absolute: 0.4 10*3/uL (ref 0.0–0.7)
Eosinophils Relative: 5 % (ref 0.0–5.0)
HCT: 44.1 % (ref 39.0–52.0)
Hemoglobin: 15 g/dL (ref 13.0–17.0)
Lymphocytes Relative: 23.9 % (ref 12.0–46.0)
Lymphs Abs: 2.1 10*3/uL (ref 0.7–4.0)
MCHC: 34 g/dL (ref 30.0–36.0)
MCV: 88.3 fl (ref 78.0–100.0)
Monocytes Absolute: 0.7 10*3/uL (ref 0.1–1.0)
Monocytes Relative: 8.2 % (ref 3.0–12.0)
Neutro Abs: 5.5 10*3/uL (ref 1.4–7.7)
Neutrophils Relative %: 62 % (ref 43.0–77.0)
Platelets: 269 10*3/uL (ref 150.0–400.0)
RBC: 4.99 Mil/uL (ref 4.22–5.81)
RDW: 13.3 % (ref 11.5–15.5)
WBC: 8.8 10*3/uL (ref 4.0–10.5)

## 2022-06-19 LAB — LIPID PANEL
Cholesterol: 169 mg/dL (ref 0–200)
HDL: 38.6 mg/dL — ABNORMAL LOW (ref >39.00)
LDL Cholesterol: 115 mg/dL — ABNORMAL HIGH (ref 0–99)
NonHDL: 130.34
Total CHOL/HDL Ratio: 4
Triglycerides: 79 mg/dL (ref 0.0–149.0)
VLDL: 15.8 mg/dL (ref 0.0–40.0)

## 2022-06-19 LAB — HEPATIC FUNCTION PANEL
ALT: 25 U/L (ref 0–53)
AST: 19 U/L (ref 0–37)
Albumin: 4.3 g/dL (ref 3.5–5.2)
Alkaline Phosphatase: 56 U/L (ref 39–117)
Bilirubin, Direct: 0.1 mg/dL (ref 0.0–0.3)
Total Bilirubin: 0.3 mg/dL (ref 0.2–1.2)
Total Protein: 7.1 g/dL (ref 6.0–8.3)

## 2022-06-19 LAB — HEMOGLOBIN A1C: Hgb A1c MFr Bld: 5.9 % (ref 4.6–6.5)

## 2022-06-19 LAB — BASIC METABOLIC PANEL
BUN: 17 mg/dL (ref 6–23)
CO2: 28 mEq/L (ref 19–32)
Calcium: 10 mg/dL (ref 8.4–10.5)
Chloride: 103 mEq/L (ref 96–112)
Creatinine, Ser: 0.81 mg/dL (ref 0.40–1.50)
GFR: 104.39 mL/min (ref >60.00)
Glucose, Bld: 85 mg/dL (ref 70–99)
Potassium: 4.6 mEq/L (ref 3.5–5.1)
Sodium: 138 mEq/L (ref 135–145)

## 2022-06-19 LAB — PSA: PSA: 0.35 ng/mL (ref 0.10–4.00)

## 2022-06-19 LAB — TSH: TSH: 1.57 u[IU]/mL (ref 0.35–5.50)

## 2022-06-19 MED ORDER — INDAPAMIDE 1.25 MG PO TABS
1.2500 mg | ORAL_TABLET | Freq: Every day | ORAL | 0 refills | Status: DC
Start: 2022-06-19 — End: 2022-09-14

## 2022-06-19 MED ORDER — IRBESARTAN 300 MG PO TABS: 300.0000 mg | ORAL_TABLET | Freq: Every day | ORAL | 0 refills | Status: DC

## 2022-06-19 NOTE — Progress Notes (Unsigned)
Subjective:  Patient ID: Malik Edwards, male    DOB: 1975-10-09  Age: 47 y.o. MRN: [id number]  CC: Annual Exam, Hypertension, and Hyperlipidemia   HPI Malik Edwards presents for a CPX and f/up ---  He is not very active but when he is he does not experience DOE, CP, SOB, or diaphoresis.   Outpatient Medications Prior to Visit  Medication Sig Dispense Refill   ARIPiprazole (ABILIFY) 2 MG tablet Take 2 mg by mouth daily.     Cholecalciferol (VITAMIN D) 50 MCG (2000 UT) tablet Take 2,000 Units by mouth daily.      REXULTI 2 MG TABS tablet Take 2 mg by mouth daily.     Salicylic Acid 40 % PADS Apply 1 Act topically daily as needed. 48 each 3   Vilazodone HCl (VIIBRYD) 40 MG TABS Take 40 mg by mouth daily.     albuterol (VENTOLIN HFA) 108 (90 Base) MCG/ACT inhaler Inhale 2 puffs into the lungs every 4 (four) hours as needed for wheezing or shortness of breath (cough, shortness of breath or wheezing.). 18 g 3   aspirin EC 81 MG EC tablet Take 2 tablets (162 mg total) by mouth daily. For heart health (Patient taking differently: Take 162 mg by mouth daily.)     carvedilol (COREG) 25 MG tablet TAKE 2 TABLETS BY MOUTH 2 TIMES DAILY WITH A MEAL 360 tablet 1   indapamide (LOZOL) 1.25 MG tablet TAKE 1 TABLET BY MOUTH DAILY 90 tablet 1   irbesartan (AVAPRO) 300 MG tablet TAKE 1 TABLET BY MOUTH EVERY DAY 90 tablet 1   Multiple Vitamin (MULTIVITAMIN WITH MINERALS) TABS tablet Take 1 tablet by mouth daily.     naltrexone (DEPADE) 50 MG tablet Take 50 mg by mouth daily.     No facility-administered medications prior to visit.    ROS Review of Systems  Constitutional:  Positive for unexpected weight change (wt gain). Negative for appetite change, chills, diaphoresis and fatigue.  HENT: Negative.    Respiratory:  Positive for apnea and cough (chronic NP cough). Negative for chest tightness, shortness of breath, wheezing and stridor.   Cardiovascular:  Positive for leg swelling. Negative  for chest pain and palpitations.  Gastrointestinal:  Negative for abdominal pain, blood in stool, constipation, diarrhea, nausea and vomiting.  Endocrine: Positive for polydipsia, polyphagia and polyuria.  Genitourinary: Negative.  Negative for difficulty urinating, hematuria, penile swelling, scrotal swelling and testicular pain.  Musculoskeletal: Negative.  Negative for arthralgias and myalgias.  Skin: Negative.   Neurological: Negative.  Negative for dizziness, weakness, light-headedness and headaches.  Hematological:  Negative for adenopathy. Does not bruise/bleed easily.  Psychiatric/Behavioral: Negative.      Objective:  BP (!) 160/102 (BP Location: Left Arm, Patient Position: Sitting, Cuff Size: Large)   Pulse 90   Temp 98.4 F (36.9 C) (Oral)   Resp 16   Ht 5\' 8"  (1.727 m)   Wt 294 lb (133.4 kg)   SpO2 95%   BMI 44.70 kg/m   BP Readings from Last 3 Encounters:  06/19/22 (!) 160/102  06/08/22 (!) 165/110  10/21/20 (!) 176/102    Wt Readings from Last 3 Encounters:  06/19/22 294 lb (133.4 kg)  10/21/20 (!) 303 lb 9.2 oz (137.7 kg)  07/23/19 (!) 303 lb 9.6 oz (137.7 kg)    Physical Exam Constitutional:      General: He is not in acute distress.    Appearance: He is obese. He is not ill-appearing, toxic-appearing or  diaphoretic.  HENT:     Nose: Nose normal.     Mouth/Throat:     Mouth: Mucous membranes are moist.  Eyes:     General: No scleral icterus.    Conjunctiva/sclera: Conjunctivae normal.  Cardiovascular:     Rate and Rhythm: Normal rate and regular rhythm.     Heart sounds: No murmur heard.    No gallop.     Comments: EKG- NSR, 79 bpm Incomplete RBBB is old No LVH or Q waves Pulmonary:     Effort: Pulmonary effort is normal.     Breath sounds: No stridor. No wheezing, rhonchi or rales.  Abdominal:     General: Abdomen is protuberant. Bowel sounds are normal. There is no distension.     Palpations: There is no hepatomegaly or splenomegaly.      Tenderness: There is no abdominal tenderness.  Musculoskeletal:        General: No swelling.     Cervical back: Neck supple.     Right lower leg: Edema (trace pitting) present.     Left lower leg: Edema (trace pitting) present.  Lymphadenopathy:     Cervical: No cervical adenopathy.  Skin:    General: Skin is warm and dry.     Findings: No rash.  Neurological:     General: No focal deficit present.     Mental Status: He is alert. Mental status is at baseline.  Psychiatric:        Mood and Affect: Mood normal.        Behavior: Behavior normal.        Thought Content: Thought content normal.        Judgment: Judgment normal.     Lab Results  Component Value Date   WBC 8.8 06/19/2022   HGB 15.0 06/19/2022   HCT 44.1 06/19/2022   PLT 269.0 06/19/2022   GLUCOSE 85 06/19/2022   CHOL 169 06/19/2022   TRIG 79.0 06/19/2022   HDL 38.60 (L) 06/19/2022   LDLCALC 115 (H) 06/19/2022   ALT 25 06/19/2022   AST 19 06/19/2022   NA 138 06/19/2022   K 4.6 06/19/2022   CL 103 06/19/2022   CREATININE 0.81 06/19/2022   BUN 17 06/19/2022   CO2 28 06/19/2022   TSH 1.57 06/19/2022   PSA 0.35 06/19/2022   INR 0.99 12/20/2017   HGBA1C 5.9 06/19/2022    No results found.   Assessment & Plan:   Primary hypertension- His BP is not well controlled. EKG is negative for LVH. Will treat. -     EKG 12-Lead -     Basic metabolic panel; Future -     CBC with Differential/Platelet; Future -     TSH; Future -     Urinalysis, Routine w reflex microscopic; Future -     Indapamide; Take 1 tablet (1.25 mg total) by mouth daily.  Dispense: 90 tablet; Refill: 0 -     Irbesartan; Take 1 tablet (300 mg total) by mouth daily.  Dispense: 90 tablet; Refill: 0  Screen for colon cancer -     Cologuard  Family history of prostate cancer in father -     PSA; Future  Dyslipidemia, goal LDL below 130- He does not tolerate statins. -     Lipid panel; Future -     TSH; Future -     Hepatic function panel;  Future  Morbid obesity (HCC) -     Basic metabolic panel; Future -  CBC with Differential/Platelet; Future -     TSH; Future -     Hepatic function panel; Future -     Hemoglobin A1c; Future  OSA (obstructive sleep apnea) -     Ambulatory referral to Sleep Studies  Chronic cough- CXR is normal. -     DG Chest 2 View; Future  Tobacco abuse - He is trying to quit smoking.  Encounter for general adult medical examination with abnormal findings- Exam completed, labs reviewed, vaccines are UTD, cancer screenings addressed, pt ed material was given.     Follow-up: Return in about 4 months (around 10/20/2022).  Sanda Linger, MD

## 2022-06-19 NOTE — Patient Instructions (Signed)
Health Maintenance, Male Adopting a healthy lifestyle and getting preventive care are important in promoting health and wellness. Ask your health care provider about: The right schedule for you to have regular tests and exams. Things you can do on your own to prevent diseases and keep yourself healthy. What should I know about diet, weight, and exercise? Eat a healthy diet  Eat a diet that includes plenty of vegetables, fruits, low-fat dairy products, and lean protein. Do not eat a lot of foods that are high in solid fats, added sugars, or sodium. Maintain a healthy weight Body mass index (BMI) is a measurement that can be used to identify possible weight problems. It estimates body fat based on height and weight. Your health care provider can help determine your BMI and help you achieve or maintain a healthy weight. Get regular exercise Get regular exercise. This is one of the most important things you can do for your health. Most adults should: Exercise for at least 150 minutes each week. The exercise should increase your heart rate and make you sweat (moderate-intensity exercise). Do strengthening exercises at least twice a week. This is in addition to the moderate-intensity exercise. Spend less time sitting. Even light physical activity can be beneficial. Watch cholesterol and blood lipids Have your blood tested for lipids and cholesterol at 48 years of age, then have this test every 5 years. You may need to have your cholesterol levels checked more often if: Your lipid or cholesterol levels are high. You are older than 48 years of age. You are at high risk for heart disease. What should I know about cancer screening? Many types of cancers can be detected early and may often be prevented. Depending on your health history and family history, you may need to have cancer screening at various ages. This may include screening for: Colorectal cancer. Prostate cancer. Skin cancer. Lung  cancer. What should I know about heart disease, diabetes, and high blood pressure? Blood pressure and heart disease High blood pressure causes heart disease and increases the risk of stroke. This is more likely to develop in people who have high blood pressure readings or are overweight. Talk with your health care provider about your target blood pressure readings. Have your blood pressure checked: Every 3-5 years if you are 18-39 years of age. Every year if you are 40 years old or older. If you are between the ages of 65 and 75 and are a current or former smoker, ask your health care provider if you should have a one-time screening for abdominal aortic aneurysm (AAA). Diabetes Have regular diabetes screenings. This checks your fasting blood sugar level. Have the screening done: Once every three years after age 45 if you are at a normal weight and have a low risk for diabetes. More often and at a younger age if you are overweight or have a high risk for diabetes. What should I know about preventing infection? Hepatitis B If you have a higher risk for hepatitis B, you should be screened for this virus. Talk with your health care provider to find out if you are at risk for hepatitis B infection. Hepatitis C Blood testing is recommended for: Everyone born from 1945 through 1965. Anyone with known risk factors for hepatitis C. Sexually transmitted infections (STIs) You should be screened each year for STIs, including gonorrhea and chlamydia, if: You are sexually active and are younger than 48 years of age. You are older than 48 years of age and your   health care provider tells you that you are at risk for this type of infection. Your sexual activity has changed since you were last screened, and you are at increased risk for chlamydia or gonorrhea. Ask your health care provider if you are at risk. Ask your health care provider about whether you are at high risk for HIV. Your health care provider  may recommend a prescription medicine to help prevent HIV infection. If you choose to take medicine to prevent HIV, you should first get tested for HIV. You should then be tested every 3 months for as long as you are taking the medicine. Follow these instructions at home: Alcohol use Do not drink alcohol if your health care provider tells you not to drink. If you drink alcohol: Limit how much you have to 0-2 drinks a day. Know how much alcohol is in your drink. In the [place] one drink equals one 12 oz bottle of beer (355 mL), one 5 oz glass of wine (148 mL), or one 1 oz glass of hard liquor (44 mL). Lifestyle Do not use any products that contain nicotine or tobacco. These products include cigarettes, chewing tobacco, and vaping devices, such as e-cigarettes. If you need help quitting, ask your health care provider. Do not use street drugs. Do not share needles. Ask your health care provider for help if you need support or information about quitting drugs. General instructions Schedule regular health, dental, and eye exams. Stay current with your vaccines. Tell your health care provider if: You often feel depressed. You have ever been abused or do not feel safe at home. Summary Adopting a healthy lifestyle and getting preventive care are important in promoting health and wellness. Follow your health care provider's instructions about healthy diet, exercising, and getting tested or screened for diseases. Follow your health care provider's instructions on monitoring your cholesterol and blood pressure. This information is not intended to replace advice given to you by your health care provider. Make sure you discuss any questions you have with your health care provider. Document Revised: 06/06/2020 Document Reviewed: 06/06/2020 Elsevier Patient Education  2023 [employer]  

## 2022-06-21 DIAGNOSIS — Z0001 Encounter for general adult medical examination with abnormal findings: Secondary | ICD-10-CM | POA: Insufficient documentation

## 2022-07-02 DIAGNOSIS — Z1211 Encounter for screening for malignant neoplasm of colon: Secondary | ICD-10-CM | POA: Diagnosis not present

## 2022-07-04 DIAGNOSIS — F411 Generalized anxiety disorder: Secondary | ICD-10-CM | POA: Diagnosis not present

## 2022-07-04 DIAGNOSIS — F319 Bipolar disorder, unspecified: Secondary | ICD-10-CM | POA: Diagnosis not present

## 2022-07-09 DIAGNOSIS — F4481 Dissociative identity disorder: Secondary | ICD-10-CM | POA: Diagnosis not present

## 2022-07-09 DIAGNOSIS — F4310 Post-traumatic stress disorder, unspecified: Secondary | ICD-10-CM | POA: Diagnosis not present

## 2022-07-09 LAB — COLOGUARD: COLOGUARD: NEGATIVE

## 2022-07-23 DIAGNOSIS — F4310 Post-traumatic stress disorder, unspecified: Secondary | ICD-10-CM | POA: Diagnosis not present

## 2022-07-23 DIAGNOSIS — F429 Obsessive-compulsive disorder, unspecified: Secondary | ICD-10-CM | POA: Diagnosis not present

## 2022-07-23 DIAGNOSIS — F411 Generalized anxiety disorder: Secondary | ICD-10-CM | POA: Diagnosis not present

## 2022-07-23 DIAGNOSIS — F319 Bipolar disorder, unspecified: Secondary | ICD-10-CM | POA: Diagnosis not present

## 2022-08-01 DIAGNOSIS — F4481 Dissociative identity disorder: Secondary | ICD-10-CM | POA: Diagnosis not present

## 2022-08-01 DIAGNOSIS — F4310 Post-traumatic stress disorder, unspecified: Secondary | ICD-10-CM | POA: Diagnosis not present

## 2022-08-14 DIAGNOSIS — F4481 Dissociative identity disorder: Secondary | ICD-10-CM | POA: Diagnosis not present

## 2022-08-14 DIAGNOSIS — F4310 Post-traumatic stress disorder, unspecified: Secondary | ICD-10-CM | POA: Diagnosis not present

## 2022-08-23 DIAGNOSIS — F4310 Post-traumatic stress disorder, unspecified: Secondary | ICD-10-CM | POA: Diagnosis not present

## 2022-08-23 DIAGNOSIS — F4481 Dissociative identity disorder: Secondary | ICD-10-CM | POA: Diagnosis not present

## 2022-08-30 DIAGNOSIS — F4310 Post-traumatic stress disorder, unspecified: Secondary | ICD-10-CM | POA: Diagnosis not present

## 2022-08-30 DIAGNOSIS — F4481 Dissociative identity disorder: Secondary | ICD-10-CM | POA: Diagnosis not present

## 2022-09-13 DIAGNOSIS — F4481 Dissociative identity disorder: Secondary | ICD-10-CM | POA: Diagnosis not present

## 2022-09-13 DIAGNOSIS — F4310 Post-traumatic stress disorder, unspecified: Secondary | ICD-10-CM | POA: Diagnosis not present

## 2022-09-14 ENCOUNTER — Other Ambulatory Visit: Payer: Self-pay | Admitting: Internal Medicine

## 2022-09-14 DIAGNOSIS — I10 Essential (primary) hypertension: Secondary | ICD-10-CM

## 2022-09-14 MED ORDER — IRBESARTAN 300 MG PO TABS
300.0000 mg | ORAL_TABLET | Freq: Every day | ORAL | 0 refills | Status: DC
Start: 2022-09-14 — End: 2022-12-10

## 2022-09-14 MED ORDER — INDAPAMIDE 1.25 MG PO TABS
1.2500 mg | ORAL_TABLET | Freq: Every day | ORAL | 0 refills | Status: DC
Start: 2022-09-14 — End: 2022-10-21

## 2022-10-17 DIAGNOSIS — F411 Generalized anxiety disorder: Secondary | ICD-10-CM | POA: Diagnosis not present

## 2022-10-17 DIAGNOSIS — F429 Obsessive-compulsive disorder, unspecified: Secondary | ICD-10-CM | POA: Diagnosis not present

## 2022-10-17 DIAGNOSIS — F319 Bipolar disorder, unspecified: Secondary | ICD-10-CM | POA: Diagnosis not present

## 2022-10-18 ENCOUNTER — Ambulatory Visit (INDEPENDENT_AMBULATORY_CARE_PROVIDER_SITE_OTHER): Payer: Medicare Other | Admitting: Internal Medicine

## 2022-10-18 ENCOUNTER — Encounter: Payer: Self-pay | Admitting: Internal Medicine

## 2022-10-18 VITALS — BP 124/82 | HR 85 | Temp 97.7°F | Ht 68.0 in | Wt 267.4 lb

## 2022-10-18 DIAGNOSIS — I10 Essential (primary) hypertension: Secondary | ICD-10-CM | POA: Diagnosis not present

## 2022-10-18 DIAGNOSIS — R053 Chronic cough: Secondary | ICD-10-CM | POA: Diagnosis not present

## 2022-10-18 DIAGNOSIS — Z720 Tobacco use: Secondary | ICD-10-CM | POA: Diagnosis not present

## 2022-10-18 LAB — BASIC METABOLIC PANEL
BUN: 20 mg/dL (ref 6–23)
CO2: 25 mEq/L (ref 19–32)
Calcium: 9.7 mg/dL (ref 8.4–10.5)
Chloride: 104 mEq/L (ref 96–112)
Creatinine, Ser: 0.9 mg/dL (ref 0.40–1.50)
GFR: 100.89 mL/min (ref >60.00)
Glucose, Bld: 100 mg/dL — ABNORMAL HIGH (ref 70–99)
Potassium: 3.6 mEq/L (ref 3.5–5.1)
Sodium: 138 mEq/L (ref 135–145)

## 2022-10-18 MED ORDER — VARENICLINE TARTRATE (STARTER) 0.5 MG X 11 & 1 MG X 42 PO TBPK
ORAL_TABLET | ORAL | 0 refills | Status: DC
Start: 2022-10-18 — End: 2023-01-26

## 2022-10-18 MED ORDER — VARENICLINE TARTRATE 1 MG PO TABS
1.0000 mg | ORAL_TABLET | Freq: Two times a day (BID) | ORAL | 3 refills | Status: DC
Start: 2022-10-18 — End: 2023-01-26

## 2022-10-18 NOTE — Patient Instructions (Signed)
Hypertension, Adult High blood pressure (hypertension) is when the force of blood pumping through the arteries is too strong. The arteries are the blood vessels that carry blood from the heart throughout the body. Hypertension forces the heart to work harder to pump blood and may cause arteries to become narrow or stiff. Untreated or uncontrolled hypertension can lead to a heart attack, heart failure, a stroke, kidney disease, and other problems. A blood pressure reading consists of a higher number over a lower number. Ideally, your blood pressure should be below 120/80. The first ("top") number is called the systolic pressure. It is a measure of the pressure in your arteries as your heart beats. The second ("bottom") number is called the diastolic pressure. It is a measure of the pressure in your arteries as the heart relaxes. What are the causes? The exact cause of this condition is not known. There are some conditions that result in high blood pressure. What increases the risk? Certain factors may make you more likely to develop high blood pressure. Some of these risk factors are under your control, including: Smoking. Not getting enough exercise or physical activity. Being overweight. Having too much fat, sugar, calories, or salt (sodium) in your diet. Drinking too much alcohol. Other risk factors include: Having a personal history of heart disease, diabetes, high cholesterol, or kidney disease. Stress. Having a family history of high blood pressure and high cholesterol. Having obstructive sleep apnea. Age. The risk increases with age. What are the signs or symptoms? High blood pressure may not cause symptoms. Very high blood pressure (hypertensive crisis) may cause: Headache. Fast or irregular heartbeats (palpitations). Shortness of breath. Nosebleed. Nausea and vomiting. Vision changes. Severe chest pain, dizziness, and seizures. How is this diagnosed? This condition is diagnosed by  measuring your blood pressure while you are seated, with your arm resting on a flat surface, your legs uncrossed, and your feet flat on the floor. The cuff of the blood pressure monitor will be placed directly against the skin of your upper arm at the level of your heart. Blood pressure should be measured at least twice using the same arm. Certain conditions can cause a difference in blood pressure between your right and left arms. If you have a high blood pressure reading during one visit or you have normal blood pressure with other risk factors, you may be asked to: Return on a different day to have your blood pressure checked again. Monitor your blood pressure at home for 1 week or longer. If you are diagnosed with hypertension, you may have other blood or imaging tests to help your health care provider understand your overall risk for other conditions. How is this treated? This condition is treated by making healthy lifestyle changes, such as eating healthy foods, exercising more, and reducing your alcohol intake. You may be referred for counseling on a healthy diet and physical activity. Your health care provider may prescribe medicine if lifestyle changes are not enough to get your blood pressure under control and if: Your systolic blood pressure is above 130. Your diastolic blood pressure is above 80. Your personal target blood pressure may vary depending on your medical conditions, your age, and other factors. Follow these instructions at home: Eating and drinking  Eat a diet that is high in fiber and potassium, and low in sodium, added sugar, and fat. An example of this eating plan is called the DASH diet. DASH stands for Dietary Approaches to Stop Hypertension. To eat this way: Eat  plenty of fresh fruits and vegetables. Try to fill one half of your plate at each meal with fruits and vegetables. Eat whole grains, such as whole-wheat pasta, brown rice, or whole-grain bread. Fill about one  fourth of your plate with whole grains. Eat or drink low-fat dairy products, such as skim milk or low-fat yogurt. Avoid fatty cuts of meat, processed or cured meats, and poultry with skin. Fill about one fourth of your plate with lean proteins, such as fish, chicken without skin, beans, eggs, or tofu. Avoid pre-made and processed foods. These tend to be higher in sodium, added sugar, and fat. Reduce your daily sodium intake. Many people with hypertension should eat less than 1,500 mg of sodium a day. Do not drink alcohol if: Your health care provider tells you not to drink. You are pregnant, may be pregnant, or are planning to become pregnant. If you drink alcohol: Limit how much you have to: 0-1 drink a day for women. 0-2 drinks a day for men. Know how much alcohol is in your drink. In the [place] one drink equals one 12 oz bottle of beer (355 mL), one 5 oz glass of wine (148 mL), or one 1 oz glass of hard liquor (44 mL). Lifestyle  Work with your health care provider to maintain a healthy body weight or to lose weight. Ask what an ideal weight is for you. Get at least 30 minutes of exercise that causes your heart to beat faster (aerobic exercise) most days of the week. Activities may include walking, swimming, or biking. Include exercise to strengthen your muscles (resistance exercise), such as Pilates or lifting weights, as part of your weekly exercise routine. Try to do these types of exercises for 30 minutes at least 3 days a week. Do not use any products that contain nicotine or tobacco. These products include cigarettes, chewing tobacco, and vaping devices, such as e-cigarettes. If you need help quitting, ask your health care provider. Monitor your blood pressure at home as told by your health care provider. Keep all follow-up visits. This is important. Medicines Take over-the-counter and prescription medicines only as told by your health care provider. Follow directions carefully. Blood  pressure medicines must be taken as prescribed. Do not skip doses of blood pressure medicine. Doing this puts you at risk for problems and can make the medicine less effective. Ask your health care provider about side effects or reactions to medicines that you should watch for. Contact a health care provider if you: Think you are having a reaction to a medicine you are taking. Have headaches that keep coming back (recurring). Feel dizzy. Have swelling in your ankles. Have trouble with your vision. Get help right away if you: Develop a severe headache or confusion. Have unusual weakness or numbness. Feel faint. Have severe pain in your chest or abdomen. Vomit repeatedly. Have trouble breathing. These symptoms may be an emergency. Get help right away. Call 911. Do not wait to see if the symptoms will go away. Do not drive yourself to the hospital. Summary Hypertension is when the force of blood pumping through your arteries is too strong. If this condition is not controlled, it may put you at risk for serious complications. Your personal target blood pressure may vary depending on your medical conditions, your age, and other factors. For most people, a normal blood pressure is less than 120/80. Hypertension is treated with lifestyle changes, medicines, or a combination of both. Lifestyle changes include losing weight, eating a healthy,  low-sodium diet, exercising more, and limiting alcohol. This information is not intended to replace advice given to you by your health care provider. Make sure you discuss any questions you have with your health care provider. Document Revised: 11/22/2020 Document Reviewed: 11/22/2020 Elsevier Patient Education  2024 [employer].

## 2022-10-18 NOTE — Progress Notes (Signed)
Subjective:  Patient ID: Malik Edwards, male    DOB: 04-11-1975  Age: 47 y.o. MRN: [id number]  CC: Hypertension   HPI Malik Edwards presents for f/up ---  Discussed the use of AI scribe software for clinical note transcription with the patient, who gave verbal consent to proceed.  History of Present Illness   The patient, with a history of hypertension, has been experiencing significant weight loss, totaling 27 pounds over the past four months. They deny any associated symptoms such as weakness, dizziness, lightheadedness, or dyspnea. They also deny any edema. The weight loss has been achieved through consistent exercise and dietary changes, including a reduction in sugar and carbohydrate intake, with an exception for beans.  In addition to their physical health, the patient is also managing their mental health with Lamictal 100mg  daily. They report doing well on this regimen, as confirmed by their mental health provider. They also note that they are approaching eighteen months of sobriety, which they believe has significantly improved their ability to manage both their physical and mental health.  Despite these positive changes, the patient continues to smoke. They report some morning cough but deny any other respiratory symptoms such as dyspnea, wheezing, or significant coughing.       Outpatient Medications Prior to Visit  Medication Sig Dispense Refill   aspirin EC 81 MG tablet Take 162 mg by mouth daily. Swallow whole.     indapamide (LOZOL) 1.25 MG tablet Take 1 tablet (1.25 mg total) by mouth daily. Follow-up appt is due must see provider for future refills 30 tablet 0   irbesartan (AVAPRO) 300 MG tablet Take 1 tablet (300 mg total) by mouth daily. Follow-up appt is due must see provider for future refills 90 tablet 0   lamoTRIgine (LAMICTAL) 100 MG tablet Take 100 mg by mouth daily.     Multiple Vitamin (MULTIVITAMIN) capsule Take 1 capsule by mouth daily.      ARIPiprazole (ABILIFY) 2 MG tablet Take 2 mg by mouth daily.     Cholecalciferol (VITAMIN D) 50 MCG (2000 UT) tablet Take 2,000 Units by mouth daily.      REXULTI 2 MG TABS tablet Take 2 mg by mouth daily.     Salicylic Acid 40 % PADS Apply 1 Act topically daily as needed. 48 each 3   Vilazodone HCl (VIIBRYD) 40 MG TABS Take 40 mg by mouth daily.     No facility-administered medications prior to visit.    ROS Review of Systems  Constitutional:  Negative for diaphoresis, fatigue and unexpected weight change.  Respiratory:  Positive for cough (rare NP cough). Negative for chest tightness, shortness of breath and wheezing.   Cardiovascular:  Negative for chest pain, palpitations and leg swelling.  Gastrointestinal: Negative.  Negative for abdominal pain, constipation, diarrhea and nausea.  Genitourinary: Negative.   Musculoskeletal: Negative.   Skin: Negative.   Neurological: Negative.  Negative for dizziness and weakness.  Hematological:  Negative for adenopathy. Does not bruise/bleed easily.  Psychiatric/Behavioral: Negative.      Objective:  BP 124/82 (BP Location: Left Arm, Patient Position: Sitting, Cuff Size: Normal)   Pulse 85   Temp 97.7 F (36.5 C) (Oral)   Ht 5\' 8"  (1.727 m)   Wt 267 lb 6.4 oz (121.3 kg)   SpO2 92%   BMI 40.66 kg/m   BP Readings from Last 3 Encounters:  10/18/22 124/82  06/19/22 (!) 160/102  06/08/22 (!) 165/110    Wt Readings from Last 3  Encounters:  10/18/22 267 lb 6.4 oz (121.3 kg)  06/19/22 294 lb (133.4 kg)  10/21/20 (!) 303 lb 9.2 oz (137.7 kg)    Physical Exam Vitals reviewed.  Constitutional:      Appearance: Normal appearance. He is not ill-appearing.  HENT:     Mouth/Throat:     Mouth: Mucous membranes are moist.  Eyes:     General: No scleral icterus.    Conjunctiva/sclera: Conjunctivae normal.  Cardiovascular:     Rate and Rhythm: Normal rate.     Heart sounds: No murmur heard.    No gallop.  Pulmonary:     Breath  sounds: Examination of the right-lower field reveals rhonchi. Examination of the left-lower field reveals rhonchi. Rhonchi present. No decreased breath sounds, wheezing or rales.  Abdominal:     General: Abdomen is flat.     Palpations: There is no mass.     Tenderness: There is no abdominal tenderness. There is no guarding.     Hernia: No hernia is present.  Musculoskeletal:        General: Normal range of motion.     Cervical back: Neck supple.     Right lower leg: No edema.     Left lower leg: No edema.  Lymphadenopathy:     Cervical: No cervical adenopathy.  Skin:    General: Skin is warm and dry.  Neurological:     General: No focal deficit present.     Mental Status: He is alert. Mental status is at baseline.  Psychiatric:        Mood and Affect: Mood normal.        Behavior: Behavior normal.     Lab Results  Component Value Date   WBC 8.8 06/19/2022   HGB 15.0 06/19/2022   HCT 44.1 06/19/2022   PLT 269.0 06/19/2022   GLUCOSE 100 (H) 10/18/2022   CHOL 169 06/19/2022   TRIG 79.0 06/19/2022   HDL 38.60 (L) 06/19/2022   LDLCALC 115 (H) 06/19/2022   ALT 25 06/19/2022   AST 19 06/19/2022   NA 138 10/18/2022   K 3.6 10/18/2022   CL 104 10/18/2022   CREATININE 0.90 10/18/2022   BUN 20 10/18/2022   CO2 25 10/18/2022   TSH 1.57 06/19/2022   PSA 0.35 06/19/2022   INR 0.99 12/20/2017   HGBA1C 5.9 06/19/2022    No results found.  Assessment & Plan:  Primary hypertension- His blood pressure is well-controlled.  Electrolytes and renal function are normal. -     Basic metabolic panel; Future  Tobacco abuse -     Varenicline Tartrate; Take 1 tablet (1 mg total) by mouth 2 (two) times daily.  Dispense: 60 tablet; Refill: 3 -     Varenicline Tartrate (Starter); Take one 0.5 mg tablet by mouth once daily for 3 days, then increase to one 0.5 mg tablet twice daily for 4 days, then increase to one 1 mg tablet twice daily.  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  This is likely early stages of chronic bronchitis or COPD.  Will work on smoking cessation.     Follow-up: Return in about 6 months (around 04/17/2023).  Sanda Linger, MD

## 2022-10-20 ENCOUNTER — Other Ambulatory Visit: Payer: Self-pay | Admitting: Internal Medicine

## 2022-10-20 DIAGNOSIS — I10 Essential (primary) hypertension: Secondary | ICD-10-CM

## 2022-10-22 ENCOUNTER — Encounter: Payer: Self-pay | Admitting: Internal Medicine

## 2022-11-13 DIAGNOSIS — H3561 Retinal hemorrhage, right eye: Secondary | ICD-10-CM | POA: Diagnosis not present

## 2022-11-13 DIAGNOSIS — H5213 Myopia, bilateral: Secondary | ICD-10-CM | POA: Diagnosis not present

## 2022-12-10 ENCOUNTER — Other Ambulatory Visit: Payer: Self-pay | Admitting: Internal Medicine

## 2022-12-10 DIAGNOSIS — I10 Essential (primary) hypertension: Secondary | ICD-10-CM

## 2022-12-10 DIAGNOSIS — F4481 Dissociative identity disorder: Secondary | ICD-10-CM | POA: Diagnosis not present

## 2022-12-10 DIAGNOSIS — F4310 Post-traumatic stress disorder, unspecified: Secondary | ICD-10-CM | POA: Diagnosis not present

## 2022-12-12 DIAGNOSIS — F4310 Post-traumatic stress disorder, unspecified: Secondary | ICD-10-CM | POA: Diagnosis not present

## 2022-12-12 DIAGNOSIS — F4481 Dissociative identity disorder: Secondary | ICD-10-CM | POA: Diagnosis not present

## 2022-12-24 DIAGNOSIS — F4481 Dissociative identity disorder: Secondary | ICD-10-CM | POA: Diagnosis not present

## 2022-12-24 DIAGNOSIS — F4312 Post-traumatic stress disorder, chronic: Secondary | ICD-10-CM | POA: Diagnosis not present

## 2023-01-02 DIAGNOSIS — F319 Bipolar disorder, unspecified: Secondary | ICD-10-CM | POA: Diagnosis not present

## 2023-01-02 DIAGNOSIS — F411 Generalized anxiety disorder: Secondary | ICD-10-CM | POA: Diagnosis not present

## 2023-01-07 DIAGNOSIS — F4310 Post-traumatic stress disorder, unspecified: Secondary | ICD-10-CM | POA: Diagnosis not present

## 2023-01-07 DIAGNOSIS — F4481 Dissociative identity disorder: Secondary | ICD-10-CM | POA: Diagnosis not present

## 2023-01-08 DIAGNOSIS — H524 Presbyopia: Secondary | ICD-10-CM | POA: Diagnosis not present

## 2023-01-10 DIAGNOSIS — F4481 Dissociative identity disorder: Secondary | ICD-10-CM | POA: Diagnosis not present

## 2023-01-10 DIAGNOSIS — F4310 Post-traumatic stress disorder, unspecified: Secondary | ICD-10-CM | POA: Diagnosis not present

## 2023-01-16 DIAGNOSIS — F4312 Post-traumatic stress disorder, chronic: Secondary | ICD-10-CM | POA: Diagnosis not present

## 2023-01-16 DIAGNOSIS — F4481 Dissociative identity disorder: Secondary | ICD-10-CM | POA: Diagnosis not present

## 2023-01-26 ENCOUNTER — Ambulatory Visit
Admission: EM | Admit: 2023-01-26 | Discharge: 2023-01-26 | Disposition: A | Discharge status: Home / Self Care | Payer: Medicare Other | Attending: Family Medicine | Admitting: Family Medicine

## 2023-01-26 ENCOUNTER — Encounter: Payer: Self-pay | Admitting: Emergency Medicine

## 2023-01-26 DIAGNOSIS — H66001 Acute suppurative otitis media without spontaneous rupture of ear drum, right ear: Secondary | ICD-10-CM | POA: Diagnosis not present

## 2023-01-26 MED ORDER — AMOXICILLIN 875 MG PO TABS: 875.0000 mg | ORAL_TABLET | Freq: Two times a day (BID) | ORAL | 0 refills | Status: DC

## 2023-01-26 NOTE — ED Triage Notes (Signed)
Right ear pain since yesterday

## 2023-01-26 NOTE — ED Provider Notes (Signed)
[facility]    CSN: [id number] Arrival date & time: 01/26/23  1340      History   Chief Complaint No chief complaint on file.   HPI Malik Edwards is a 47 y.o. male.   Patient presenting today with 1 day history of progressively worsening sharp right ear pain.  States his hearing is muffled and he feels pressure and popping in the ear as well.  Recently got over a cold type illness, still taking some DayQuil here and there for symptoms.    Past Medical History:  Diagnosis Date   Alcoholic (HCC)    Anxiety    Atrial flutter (HCC) 2013   Cardiomyopathy (HCC)    Colitis    Depression    High cholesterol    Hypertension    OCD (obsessive compulsive disorder)    OSA (obstructive sleep apnea) 01/22/2013   PTSD (post-traumatic stress disorder)    PTSD (post-traumatic stress disorder)     Patient Active Problem List   Diagnosis Date Noted   Encounter for general adult medical examination with abnormal findings 06/21/2022   Screen for colon cancer 06/19/2022   Family history of prostate cancer in father 06/19/2022   Dyslipidemia, goal LDL below 130 06/19/2022   Morbid obesity (HCC) 06/19/2022   Chronic cough 06/19/2022   Tobacco abuse 06/19/2022   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   PTSD (post-traumatic stress disorder) 01/13/2015   MDD (major depressive disorder), recurrent severe, 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   OSA (obstructive sleep apnea) 01/22/2013   Cardiomyopathy- etiol uindetermined- EF 30 to 35% 09/13/2012   HTN (hypertension) 09/13/2012    Past Surgical History:  Procedure Laterality Date   CARDIOVERSION N/A 09/15/2012   Procedure: CARDIOVERSION;  Surgeon: Chrystie Nose, MD;  Location: [facility] OR;  Service: Cardiovascular;  Laterality: N/A;   CARPAL TUNNEL RELEASE Right 04/16/2019   Procedure: CARPAL TUNNEL RELEASE;  Surgeon: Betha Loa, MD;  Location: [facility] OR;   Service: Orthopedics;  Laterality: Right;   ULNAR NERVE TRANSPOSITION Right 04/16/2019   Procedure: RIGHT ULNAR NERVE DECOMPRESSION AT WRIST, RIGHT ULNAR NERVE AT ELBOW;  Surgeon: Betha Loa, MD;  Location: [facility] OR;  Service: Orthopedics;  Laterality: Right;       Home Medications    Prior to Admission medications   Medication Sig Start Date End Date Taking? Authorizing Provider  amoxicillin (AMOXIL) 875 MG tablet Take 1 tablet (875 mg total) by mouth 2 (two) times daily. 01/26/23  Yes Particia Nearing, PA-C  aspirin EC 81 MG tablet Take 162 mg by mouth daily. Swallow whole.    [provider]  indapamide (LOZOL) 1.25 MG tablet Take 1 tablet by mouth once daily 10/21/22   Etta Grandchild, MD  irbesartan (AVAPRO) 300 MG tablet Take 1 tablet by mouth once daily 12/10/22   Etta Grandchild, MD  lamoTRIgine (LAMICTAL) 100 MG tablet Take 100 mg by mouth daily. 10/08/22   [provider]  Multiple Vitamin (MULTIVITAMIN) capsule Take 1 capsule by mouth daily.    [provider]    Family History Family History  Problem Relation Age of Onset   Healthy Mother    Healthy Father    Cancer Father        prostate cancer   Heart attack Maternal Grandmother    Heart attack Maternal Grandfather    Heart attack Paternal Grandmother    Sudden Cardiac Death Neg Hx  Social History Social History   Tobacco Use   Smoking status: Every Day    Current packs/day: 1.00    Average packs/day: 1 pack/day for 25.0 years (25.0 ttl pk-yrs)    Types: Cigarettes   Smokeless tobacco: Never  Vaping Use   Vaping status: Never Used  Substance Use Topics   Alcohol use: Not Currently    Alcohol/week: 12.0 standard drinks of alcohol    Types: 12 Standard drinks or equivalent per week    Comment: 1/2 gallon liquor per day. drinks about a fifth about 3 times a week.   Drug use: No    Comment: denied all drug use     Allergies   Gabapentin and Statins   Review of  Systems Review of Systems Per HPI  Physical Exam Triage Vital Signs ED Triage Vitals  Encounter Vitals Group     BP 01/26/23 1403 (!) 156/94     Systolic BP Percentile --      Diastolic BP Percentile --      Pulse Rate 01/26/23 1403 88     Resp 01/26/23 1403 18     Temp 01/26/23 1403 98.8 F (37.1 C)     Temp Source 01/26/23 1403 Oral     SpO2 01/26/23 1403 95 %     Weight --      Height --      Head Circumference --      Peak Flow --      Pain Score 01/26/23 1404 2     Pain Loc --      Pain Education --      Exclude from Growth Chart --    No data found.  Updated Vital Signs BP (!) 156/94 (BP Location: Right Arm)   Pulse 88   Temp 98.8 F (37.1 C) (Oral)   Resp 18   SpO2 95%   Visual Acuity Right Eye Distance:   Left Eye Distance:   Bilateral Distance:    Right Eye Near:   Left Eye Near:    Bilateral Near:     Physical Exam Vitals and nursing note reviewed.  Constitutional:      Appearance: Normal appearance.  HENT:     Head: Atraumatic.     Left Ear: Tympanic membrane normal.     Ears:     Comments: Right TM erythematous and edematous    Nose: Nose normal.     Mouth/Throat:     Mouth: Mucous membranes are moist.     Pharynx: Oropharynx is clear.  Eyes:     Extraocular Movements: Extraocular movements intact.     Conjunctiva/sclera: Conjunctivae normal.  Cardiovascular:     Rate and Rhythm: Normal rate and regular rhythm.  Pulmonary:     Effort: Pulmonary effort is normal.     Breath sounds: Normal breath sounds. No wheezing or rales.  Musculoskeletal:        General: Normal range of motion.     Cervical back: Normal range of motion and neck supple.  Skin:    General: Skin is warm and dry.  Neurological:     General: No focal deficit present.     Mental Status: He is oriented to person, place, and time.  Psychiatric:        Mood and Affect: Mood normal.        Thought Content: Thought content normal.        Judgment: Judgment normal.     UC Treatments / Results  Labs (all  labs ordered are listed, but only abnormal results are displayed) Labs Reviewed - No data to display  EKG   Radiology No results found.  Procedures Procedures (including critical care time)  Medications Ordered in UC Medications - No data to display  Initial Impression / Assessment and Plan / UC Course  I have reviewed the triage vital signs and the nursing notes.  Pertinent labs & imaging results that were available during my care of the patient were reviewed by me and considered in my medical decision making (see chart for details).     Treat with Amoxil, Flonase, Coricidin HBP, supportive over-the-counter medications and home care.  Return for worsening symptoms.  Final Clinical Impressions(s) / UC Diagnoses   Final diagnoses:  Acute suppurative otitis media of right ear without spontaneous rupture of tympanic membrane, recurrence not specified     Discharge Instructions      I have sent in some antibiotics to treat your ear infection.  You may also use Flonase nasal spray twice daily and Coricidin HBP to help with the sinus pressure and congestion that is keeping fluid pressure in the ears.    ED Prescriptions     Medication Sig Dispense Auth. Provider   amoxicillin (AMOXIL) 875 MG tablet Take 1 tablet (875 mg total) by mouth 2 (two) times daily. 20 tablet Particia Nearing, [place]      PDMP not reviewed this encounter.   Particia Nearing, [place] 01/26/23 1610

## 2023-01-26 NOTE — Discharge Instructions (Signed)
I have sent in some antibiotics to treat your ear infection.  You may also use Flonase nasal spray twice daily and Coricidin HBP to help with the sinus pressure and congestion that is keeping fluid pressure in the ears.

## 2023-03-07 DIAGNOSIS — F4481 Dissociative identity disorder: Secondary | ICD-10-CM | POA: Diagnosis not present

## 2023-03-07 DIAGNOSIS — F4310 Post-traumatic stress disorder, unspecified: Secondary | ICD-10-CM | POA: Diagnosis not present

## 2023-03-09 ENCOUNTER — Other Ambulatory Visit: Payer: Self-pay | Admitting: Internal Medicine

## 2023-03-09 DIAGNOSIS — I10 Essential (primary) hypertension: Secondary | ICD-10-CM

## 2023-03-21 DIAGNOSIS — F4312 Post-traumatic stress disorder, chronic: Secondary | ICD-10-CM | POA: Diagnosis not present

## 2023-03-21 DIAGNOSIS — F4481 Dissociative identity disorder: Secondary | ICD-10-CM | POA: Diagnosis not present

## 2023-03-26 DIAGNOSIS — F4481 Dissociative identity disorder: Secondary | ICD-10-CM | POA: Diagnosis not present

## 2023-03-26 DIAGNOSIS — F4310 Post-traumatic stress disorder, unspecified: Secondary | ICD-10-CM | POA: Diagnosis not present

## 2023-04-03 DIAGNOSIS — F319 Bipolar disorder, unspecified: Secondary | ICD-10-CM | POA: Diagnosis not present

## 2023-04-03 DIAGNOSIS — F17200 Nicotine dependence, unspecified, uncomplicated: Secondary | ICD-10-CM | POA: Diagnosis not present

## 2023-04-03 DIAGNOSIS — F411 Generalized anxiety disorder: Secondary | ICD-10-CM | POA: Diagnosis not present

## 2023-04-11 DIAGNOSIS — F4481 Dissociative identity disorder: Secondary | ICD-10-CM | POA: Diagnosis not present

## 2023-04-11 DIAGNOSIS — F4310 Post-traumatic stress disorder, unspecified: Secondary | ICD-10-CM | POA: Diagnosis not present

## 2023-04-16 ENCOUNTER — Other Ambulatory Visit: Payer: Self-pay | Admitting: Internal Medicine

## 2023-04-16 DIAGNOSIS — I10 Essential (primary) hypertension: Secondary | ICD-10-CM

## 2023-05-14 ENCOUNTER — Other Ambulatory Visit: Payer: Self-pay | Admitting: Internal Medicine

## 2023-05-14 DIAGNOSIS — I10 Essential (primary) hypertension: Secondary | ICD-10-CM

## 2023-06-07 DIAGNOSIS — F4310 Post-traumatic stress disorder, unspecified: Secondary | ICD-10-CM | POA: Diagnosis not present

## 2023-06-07 DIAGNOSIS — F4481 Dissociative identity disorder: Secondary | ICD-10-CM | POA: Diagnosis not present

## 2023-06-18 DIAGNOSIS — F4481 Dissociative identity disorder: Secondary | ICD-10-CM | POA: Diagnosis not present

## 2023-06-18 DIAGNOSIS — F4310 Post-traumatic stress disorder, unspecified: Secondary | ICD-10-CM | POA: Diagnosis not present

## 2023-07-02 ENCOUNTER — Ambulatory Visit

## 2023-07-03 DIAGNOSIS — F319 Bipolar disorder, unspecified: Secondary | ICD-10-CM | POA: Diagnosis not present

## 2023-07-03 DIAGNOSIS — F4310 Post-traumatic stress disorder, unspecified: Secondary | ICD-10-CM | POA: Diagnosis not present

## 2023-07-03 DIAGNOSIS — F411 Generalized anxiety disorder: Secondary | ICD-10-CM | POA: Diagnosis not present

## 2023-07-25 ENCOUNTER — Encounter: Payer: Self-pay | Admitting: Internal Medicine

## 2023-07-25 ENCOUNTER — Ambulatory Visit (INDEPENDENT_AMBULATORY_CARE_PROVIDER_SITE_OTHER): Admitting: Internal Medicine

## 2023-07-25 ENCOUNTER — Ambulatory Visit

## 2023-07-25 ENCOUNTER — Other Ambulatory Visit: Payer: Self-pay | Admitting: Internal Medicine

## 2023-07-25 ENCOUNTER — Ambulatory Visit: Payer: Self-pay | Admitting: Internal Medicine

## 2023-07-25 VITALS — BP 140/80 | HR 88 | Temp 98.9°F | Resp 16 | Ht 68.0 in | Wt 266.2 lb

## 2023-07-25 DIAGNOSIS — Z8042 Family history of malignant neoplasm of prostate: Secondary | ICD-10-CM | POA: Diagnosis not present

## 2023-07-25 DIAGNOSIS — J410 Simple chronic bronchitis: Secondary | ICD-10-CM | POA: Diagnosis not present

## 2023-07-25 DIAGNOSIS — R053 Chronic cough: Secondary | ICD-10-CM

## 2023-07-25 DIAGNOSIS — I10 Essential (primary) hypertension: Secondary | ICD-10-CM

## 2023-07-25 DIAGNOSIS — Z6841 Body Mass Index (BMI) 40.0 and over, adult: Secondary | ICD-10-CM

## 2023-07-25 DIAGNOSIS — R8290 Unspecified abnormal findings in urine: Secondary | ICD-10-CM | POA: Diagnosis not present

## 2023-07-25 DIAGNOSIS — I426 Alcoholic cardiomyopathy: Secondary | ICD-10-CM

## 2023-07-25 DIAGNOSIS — Z0001 Encounter for general adult medical examination with abnormal findings: Secondary | ICD-10-CM | POA: Diagnosis not present

## 2023-07-25 DIAGNOSIS — E785 Hyperlipidemia, unspecified: Secondary | ICD-10-CM

## 2023-07-25 DIAGNOSIS — G4733 Obstructive sleep apnea (adult) (pediatric): Secondary | ICD-10-CM

## 2023-07-25 DIAGNOSIS — R9431 Abnormal electrocardiogram [ECG] [EKG]: Secondary | ICD-10-CM | POA: Diagnosis not present

## 2023-07-25 LAB — CBC WITH DIFFERENTIAL/PLATELET
Basophils Absolute: 0.1 10*3/uL (ref 0.0–0.1)
Basophils Relative: 1 % (ref 0.0–3.0)
Eosinophils Absolute: 0.4 10*3/uL (ref 0.0–0.7)
Eosinophils Relative: 3.6 % (ref 0.0–5.0)
HCT: 47.1 % (ref 39.0–52.0)
Hemoglobin: 16.1 g/dL (ref 13.0–17.0)
Lymphocytes Relative: 21.1 % (ref 12.0–46.0)
Lymphs Abs: 2.5 10*3/uL (ref 0.7–4.0)
MCHC: 34.3 g/dL (ref 30.0–36.0)
MCV: 88.9 fl (ref 78.0–100.0)
Monocytes Absolute: 0.9 10*3/uL (ref 0.1–1.0)
Monocytes Relative: 7.7 % (ref 3.0–12.0)
Neutro Abs: 7.9 10*3/uL — ABNORMAL HIGH (ref 1.4–7.7)
Neutrophils Relative %: 66.6 % (ref 43.0–77.0)
Platelets: 274 10*3/uL (ref 150.0–400.0)
RBC: 5.3 Mil/uL (ref 4.22–5.81)
RDW: 12.7 % (ref 11.5–15.5)
WBC: 11.8 10*3/uL — ABNORMAL HIGH (ref 4.0–10.5)

## 2023-07-25 LAB — TSH: TSH: 1.87 u[IU]/mL (ref 0.35–5.50)

## 2023-07-25 LAB — PSA: PSA: 0.37 ng/mL (ref 0.10–4.00)

## 2023-07-25 LAB — BRAIN NATRIURETIC PEPTIDE: Pro B Natriuretic peptide (BNP): 2 pg/mL (ref 0.0–100.0)

## 2023-07-25 LAB — TROPONIN I (HIGH SENSITIVITY): High Sens Troponin I: 6 ng/L (ref 2–17)

## 2023-07-25 MED ORDER — ALBUTEROL SULFATE HFA 108 (90 BASE) MCG/ACT IN AERS: 2.0000 | INHALATION_SPRAY | Freq: Four times a day (QID) | RESPIRATORY_TRACT | 3 refills | Status: AC | PRN

## 2023-07-25 MED ORDER — UMECLIDINIUM BROMIDE 62.5 MCG/ACT IN AEPB: 1.0000 | INHALATION_SPRAY | Freq: Every day | RESPIRATORY_TRACT | 1 refills | Status: AC

## 2023-07-25 NOTE — Progress Notes (Unsigned)
 Subjective:  Patient ID: Malik Edwards, male    DOB: March 23, 1975  Age: 48 y.o. MRN: [id number]  CC: Annual Exam, Hypertension, Cough, and Hyperlipidemia   HPI Malik Edwards presents for a CPX and f/up ---  Discussed the use of AI scribe software for clinical note transcription with the patient, who gave verbal consent to proceed.  History of Present Illness   Malik Edwards is a 48 year old male who presents for medication refill and evaluation of wheezing and sleep apnea.  He has run out of one of his medications. No symptoms of high blood pressure such as headache, blurred vision, chest pain, shortness of breath, dizziness, or lightheadedness. He is currently taking Lamictal  100 mg for mood stabilization, which he finds effective.  He experiences wheezing, particularly when lying down at night, sometimes described as 'wet'. He has not used an albuterol  inhaler for years. No hemoptysis or productive cough.  He was diagnosed with sleep apnea years ago and previously used a CPAP machine, which he lost. He has not had a sleep study in approximately ten years. His snoring has become a concern since meeting a new partner last fall.  He mentions a 'sweet' odor in his urine for several months, which he discovered through online research could be related to diabetes. No dysuria, weak urine stream, or dribbling. He typically wakes once per night to urinate, attributing it to high water intake.  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 He smokes cigarettes but does not consume alcohol . He remains active through work, which involves physical activity, and feels capable of keeping up with peers despite not exercising intentionally.       Outpatient Medications Prior to Visit  Medication Sig Dispense Refill   aspirin  EC 81 MG tablet Take 162 mg by mouth daily. Swallow whole.     lamoTRIgine  (LAMICTAL ) 100 MG tablet Take  100 mg by mouth daily.     Multiple Vitamin (MULTIVITAMIN) capsule Take 1 capsule by mouth daily.     indapamide  (LOZOL ) 1.25 MG tablet Take 1 tablet (1.25 mg total) by mouth daily. Please schedule follow up visit with Dr. Joshua for further refills. 30 tablet 0   irbesartan  (AVAPRO ) 300 MG tablet Take 1 tablet by mouth once daily 90 tablet 0   amoxicillin  (AMOXIL ) 875 MG tablet Take 1 tablet (875 mg total) by mouth 2 (two) times daily. 20 tablet 0   No facility-administered medications prior to visit.    ROS Review of Systems  Constitutional:  Negative for appetite change, chills, diaphoresis, fatigue and fever.  HENT: Negative.    Respiratory:  Positive for apnea, cough, shortness of breath and wheezing. Negative for chest tightness.   Cardiovascular:  Negative for chest pain, palpitations and leg swelling.  Gastrointestinal:  Negative for abdominal pain, constipation, diarrhea, nausea and vomiting.  Endocrine: Negative for polydipsia, polyphagia and polyuria.  Genitourinary:  Positive for dysuria. Negative for difficulty urinating, flank pain, penile pain, penile swelling and scrotal swelling.  Musculoskeletal: Negative.  Negative for arthralgias and myalgias.  Skin: Negative.   Neurological:  Negative for dizziness, weakness and light-headedness.  Hematological:  Negative for adenopathy. Does not bruise/bleed easily.  Psychiatric/Behavioral: Negative.      Objective:  BP (!) 140/80 (BP Location: Left Arm, Patient Position: Sitting, Cuff Size: Normal)   Pulse 88   Temp 98.9 F (37.2 C) (Temporal)   Resp 16   Ht 5' 8 (  1.727 m)   Wt 266 lb 3.2 oz (120.7 kg)   SpO2 95%   BMI 40.48 kg/m   BP Readings from Last 3 Encounters:  07/25/23 (!) 140/80  01/26/23 (!) 156/94  10/18/22 124/82    Wt Readings from Last 3 Encounters:  07/25/23 266 lb 3.2 oz (120.7 kg)  10/18/22 267 lb 6.4 oz (121.3 kg)  06/19/22 294 lb (133.4 kg)    Physical Exam Vitals reviewed.  Constitutional:       General: He is not in acute distress.    Appearance: He is obese. He is not toxic-appearing or diaphoretic.  HENT:     Mouth/Throat:     Mouth: Mucous membranes are moist.   Eyes:     General: No scleral icterus.    Conjunctiva/sclera: Conjunctivae normal.    Cardiovascular:     Rate and Rhythm: Normal rate and regular rhythm.     Heart sounds: No murmur heard.    No friction rub. No gallop.     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     Breath sounds: No stridor. Examination of the right-lower field reveals rhonchi. Rhonchi present. No decreased breath sounds, wheezing or rales.  Chest:     Chest wall: No tenderness.  Abdominal:     General: Abdomen is protuberant. Bowel sounds are normal. There is no distension.     Palpations: Abdomen is soft. There is no hepatomegaly, splenomegaly or mass.     Tenderness: There is no abdominal tenderness. There is no guarding or rebound.   Musculoskeletal:     Cervical back: Neck supple.     Right lower leg: No edema.     Left lower leg: No edema.  Lymphadenopathy:     Cervical: No cervical adenopathy.   Skin:    General: Skin is warm and dry.     Findings: No rash.   Neurological:     General: No focal deficit present.     Mental Status: He is alert. Mental status is at baseline.   Psychiatric:        Mood and Affect: Mood normal.        Behavior: Behavior normal.        Thought Content: Thought content normal.        Judgment: Judgment normal.     Lab Results  Component Value Date   WBC 11.8 (H) 07/25/2023   HGB 16.1 07/25/2023   HCT 47.1 07/25/2023   PLT 274.0 07/25/2023   GLUCOSE 104 (H) 07/25/2023   CHOL 203 (H) 07/25/2023   TRIG 174.0 (H) 07/25/2023   HDL 37.10 (L) 07/25/2023   LDLCALC 132 (H) 07/25/2023   ALT 23 07/25/2023   AST 23 07/25/2023   NA 137 07/25/2023   K 4.3 07/25/2023   CL 99 07/25/2023    CREATININE 0.91 07/25/2023   BUN 29 (H) 07/25/2023   CO2 25 07/25/2023   TSH 1.87 07/25/2023   PSA 0.37 07/25/2023   INR 0.99 12/20/2017   HGBA1C 5.8 07/25/2023    DG Chest 2 View Result Date: 07/25/2023 CLINICAL DATA:  Chronic cough. EXAM: CHEST - 2 VIEW COMPARISON:  Chest radiograph dated 06/19/2022. FINDINGS: No focal consolidation, pleural effusion, pneumothorax. The cardiac silhouette is within limits. No acute osseous pathology. IMPRESSION: No active cardiopulmonary disease. Electronically Signed   By: Vanetta Shelia HERO.D.  On: 07/25/2023 17:03       Assessment & Plan:   Primary hypertension- BP is adequately well controlled. EKG is negative for LVH. -     TSH; Future -     Urinalysis, Routine w reflex microscopic; Future -     Hepatic function panel; Future -     CBC with Differential/Platelet; Future -     Basic metabolic panel with GFR; Future -     EKG 12-Lead -     Indapamide ; Take 1 tablet (1.25 mg total) by mouth daily.  Dispense: 90 tablet; Refill: 1 -     Irbesartan ; Take 1 tablet (300 mg total) by mouth daily.  Dispense: 90 tablet; Refill: 1  Morbid obesity (HCC) -     Hepatic function panel; Future -     Hemoglobin A1c; Future  Dyslipidemia, goal LDL below 130- Will start a statin for CV risk reduction. -     Lipid panel; Future -     TSH; Future -     Hepatic function panel; Future -     Rosuvastatin Calcium ; Take 1 tablet (10 mg total) by mouth daily.  Dispense: 90 tablet; Refill: 1  Chronic cough -     DG Chest 2 View; Future  OSA (obstructive sleep apnea) -     Pulmonary Visit -     Zepbound; Inject 2.5 mg into the skin once a week.  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 -     Troponin I (High Sensitivity); Future -     Brain natriuretic peptide; Future  Encounter for general adult medical examination with abnormal findings- Exam completed, labs reviewed, vaccines reviewed, cancer screenings  addressed, pt ed material was given.   Family history of prostate cancer in father -     PSA; Future  Foul smelling urine -     CULTURE, URINE COMPREHENSIVE; Future  Abnormal electrocardiogram (ECG) (EKG) -     Troponin I (High Sensitivity); Future -     Brain natriuretic peptide; Future  Simple chronic bronchitis (HCC) -     Umeclidinium Bromide ; Inhale 1 puff into the lungs daily.  Dispense: 120 each; Refill: 1 -     Albuterol  Sulfate HFA; Inhale 2 puffs into the lungs every 6 (six) hours as needed for wheezing or shortness of breath.  Dispense: 18 g; Refill: 3     Follow-up: Return in about 6 months (around 01/24/2024).  Debby Molt, MD

## 2023-07-25 NOTE — Patient Instructions (Signed)
 Health Maintenance, Male  Adopting a healthy lifestyle and getting preventive care are important in promoting health and wellness. Ask your health care provider about:  The right schedule for you to have regular tests and exams.  Things you can do on your own to prevent diseases and keep yourself healthy.  What should I know about diet, weight, and exercise?  Eat a healthy diet    Eat a diet that includes plenty of vegetables, fruits, low-fat dairy products, and lean protein.  Do not eat a lot of foods that are high in solid fats, added sugars, or sodium.  Maintain a healthy weight  Body mass index (BMI) is a measurement that can be used to identify possible weight problems. It estimates body fat based on height and weight. Your health care provider can help determine your BMI and help you achieve or maintain a healthy weight.  Get regular exercise  Get regular exercise. This is one of the most important things you can do for your health. Most adults should:  Exercise for at least 150 minutes each week. The exercise should increase your heart rate and make you sweat (moderate-intensity exercise).  Do strengthening exercises at least twice a week. This is in addition to the moderate-intensity exercise.  Spend less time sitting. Even light physical activity can be beneficial.  Watch cholesterol and blood lipids  Have your blood tested for lipids and cholesterol at 49 years of age, then have this test every 5 years.  You may need to have your cholesterol levels checked more often if:  Your lipid or cholesterol levels are high.  You are older than 49 years of age.  You are at high risk for heart disease.  What should I know about cancer screening?  Many types of cancers can be detected early and may often be prevented. Depending on your health history and family history, you may need to have cancer screening at various ages. This may include screening for:  Colorectal cancer.  Prostate cancer.  Skin cancer.  Lung  cancer.  What should I know about heart disease, diabetes, and high blood pressure?  Blood pressure and heart disease  High blood pressure causes heart disease and increases the risk of stroke. This is more likely to develop in people who have high blood pressure readings or are overweight.  Talk with your health care provider about your target blood pressure readings.  Have your blood pressure checked:  Every 3-5 years if you are 9-95 years of age.  Every year if you are 85 years old or older.  If you are between the ages of 29 and 29 and are a current or former smoker, ask your health care provider if you should have a one-time screening for abdominal aortic aneurysm (AAA).  Diabetes  Have regular diabetes screenings. This checks your fasting blood sugar level. Have the screening done:  Once every three years after age 23 if you are at a normal weight and have a low risk for diabetes.  More often and at a younger age if you are overweight or have a high risk for diabetes.  What should I know about preventing infection?  Hepatitis B  If you have a higher risk for hepatitis B, you should be screened for this virus. Talk with your health care provider to find out if you are at risk for hepatitis B infection.  Hepatitis C  Blood testing is recommended for:  Everyone born from [birth year] through 1966.  Anyone  with known risk factors for hepatitis C.  Sexually transmitted infections (STIs)  You should be screened each year for STIs, including gonorrhea and chlamydia, if:  You are sexually active and are younger than 49 years of age.  You are older than 49 years of age and your health care provider tells you that you are at risk for this type of infection.  Your sexual activity has changed since you were last screened, and you are at increased risk for chlamydia or gonorrhea. Ask your health care provider if you are at risk.  Ask your health care provider about whether you are at high risk for HIV. Your health care provider  may recommend a prescription medicine to help prevent HIV infection. If you choose to take medicine to prevent HIV, you should first get tested for HIV. You should then be tested every 3 months for as long as you are taking the medicine.  Follow these instructions at home:  Alcohol use  Do not drink alcohol if your health care provider tells you not to drink.  If you drink alcohol:  Limit how much you have to 0-2 drinks a day.  Know how much alcohol is in your drink. In the [place] one drink equals one 12 oz bottle of beer (355 mL), one 5 oz glass of wine (148 mL), or one 1 oz glass of hard liquor (44 mL).  Lifestyle  Do not use any products that contain nicotine or tobacco. These products include cigarettes, chewing tobacco, and vaping devices, such as e-cigarettes. If you need help quitting, ask your health care provider.  Do not use street drugs.  Do not share needles.  Ask your health care provider for help if you need support or information about quitting drugs.  General instructions  Schedule regular health, dental, and eye exams.  Stay current with your vaccines.  Tell your health care provider if:  You often feel depressed.  You have ever been abused or do not feel safe at home.  Summary  Adopting a healthy lifestyle and getting preventive care are important in promoting health and wellness.  Follow your health care provider's instructions about healthy diet, exercising, and getting tested or screened for diseases.  Follow your health care provider's instructions on monitoring your cholesterol and blood pressure.  This information is not intended to replace advice given to you by your health care provider. Make sure you discuss any questions you have with your health care provider.  Document Revised: 06/06/2020 Document Reviewed: 06/06/2020  Elsevier Patient Education  2024 [employer].

## 2023-07-26 ENCOUNTER — Telehealth: Payer: Self-pay

## 2023-07-26 ENCOUNTER — Other Ambulatory Visit (HOSPITAL_COMMUNITY): Payer: Self-pay

## 2023-07-26 ENCOUNTER — Other Ambulatory Visit: Payer: Self-pay | Admitting: Internal Medicine

## 2023-07-26 DIAGNOSIS — I10 Essential (primary) hypertension: Secondary | ICD-10-CM

## 2023-07-26 LAB — HEPATIC FUNCTION PANEL
ALT: 23 U/L (ref 0–53)
AST: 23 U/L (ref 0–37)
Albumin: 4.7 g/dL (ref 3.5–5.2)
Alkaline Phosphatase: 55 U/L (ref 39–117)
Bilirubin, Direct: 0.1 mg/dL (ref 0.0–0.3)
Total Bilirubin: 0.3 mg/dL (ref 0.2–1.2)
Total Protein: 7.6 g/dL (ref 6.0–8.3)

## 2023-07-26 LAB — LIPID PANEL
Cholesterol: 203 mg/dL — ABNORMAL HIGH (ref 0–200)
HDL: 37.1 mg/dL — ABNORMAL LOW (ref >39.00)
LDL Cholesterol: 132 mg/dL — ABNORMAL HIGH (ref 0–99)
NonHDL: 166.39
Total CHOL/HDL Ratio: 5
Triglycerides: 174 mg/dL — ABNORMAL HIGH (ref 0.0–149.0)
VLDL: 34.8 mg/dL (ref 0.0–40.0)

## 2023-07-26 LAB — URINALYSIS, ROUTINE W REFLEX MICROSCOPIC
Bilirubin Urine: NEGATIVE
Hgb urine dipstick: NEGATIVE
Ketones, ur: NEGATIVE
Leukocytes,Ua: NEGATIVE
Nitrite: NEGATIVE
RBC / HPF: NONE SEEN (ref 0–?)
Specific Gravity, Urine: 1.01 (ref 1.000–1.030)
Total Protein, Urine: NEGATIVE
Urine Glucose: NEGATIVE
Urobilinogen, UA: 0.2 (ref 0.0–1.0)
WBC, UA: NONE SEEN (ref 0–?)
pH: 6.5 (ref 5.0–8.0)

## 2023-07-26 LAB — BASIC METABOLIC PANEL WITH GFR
BUN: 29 mg/dL — ABNORMAL HIGH (ref 6–23)
CO2: 25 meq/L (ref 19–32)
Calcium: 10.1 mg/dL (ref 8.4–10.5)
Chloride: 99 meq/L (ref 96–112)
Creatinine, Ser: 0.91 mg/dL (ref 0.40–1.50)
GFR: 99.03 mL/min (ref >60.00)
Glucose, Bld: 104 mg/dL — ABNORMAL HIGH (ref 70–99)
Potassium: 4.3 meq/L (ref 3.5–5.1)
Sodium: 137 meq/L (ref 135–145)

## 2023-07-26 LAB — HEMOGLOBIN A1C: Hgb A1c MFr Bld: 5.8 % (ref 4.6–6.5)

## 2023-07-26 MED ORDER — INDAPAMIDE 1.25 MG PO TABS: 1.2500 mg | ORAL_TABLET | Freq: Every day | ORAL | 1 refills | Status: AC

## 2023-07-26 MED ORDER — ZEPBOUND 2.5 MG/0.5ML ~~LOC~~ SOAJ: 2.5000 mg | SUBCUTANEOUS | 0 refills | Status: AC

## 2023-07-26 MED ORDER — IRBESARTAN 300 MG PO TABS: 300.0000 mg | ORAL_TABLET | Freq: Every day | ORAL | 1 refills | Status: AC

## 2023-07-26 NOTE — Telephone Encounter (Signed)
 Pharmacy Patient Advocate Encounter   Received notification from CoverMyMeds that prior authorization for Zepbound 2.5is required/requested.   Insurance verification completed.   The patient is insured through [facility] .   Per test claim: PA required; PA submitted to above mentioned insurance via CoverMyMeds Key/confirmation #/EOC [id number] Status is pending

## 2023-07-27 LAB — CULTURE, URINE COMPREHENSIVE

## 2023-07-29 MED ORDER — ROSUVASTATIN CALCIUM 10 MG PO TABS: 10.0000 mg | ORAL_TABLET | Freq: Every day | ORAL | 1 refills | Status: DC

## 2023-07-29 NOTE — Telephone Encounter (Signed)
 Patient is aware

## 2023-07-29 NOTE — Telephone Encounter (Signed)
 Copied from CRM [id number]. Topic: Clinical - Medication Prior Auth >> Jul 26, 2023  4:48 PM Tiffany S wrote: Reason for CRM: Zepbound is approved shara is good for 1 year

## 2023-07-29 NOTE — Telephone Encounter (Signed)
 Pharmacy Patient Advocate Encounter  Received notification from [facility] that Prior Authorization for ZEPBOUND has been APPROVED from 07/26/23 to 07/25/24   PA #/Case ID/Reference #: [id number]

## 2023-07-29 NOTE — Telephone Encounter (Signed)
 Patient has been made aware.

## 2023-08-07 DIAGNOSIS — F319 Bipolar disorder, unspecified: Secondary | ICD-10-CM | POA: Diagnosis not present

## 2023-08-07 DIAGNOSIS — F411 Generalized anxiety disorder: Secondary | ICD-10-CM | POA: Diagnosis not present

## 2023-08-07 DIAGNOSIS — F17200 Nicotine dependence, unspecified, uncomplicated: Secondary | ICD-10-CM | POA: Diagnosis not present

## 2023-09-04 DIAGNOSIS — F4481 Dissociative identity disorder: Secondary | ICD-10-CM | POA: Diagnosis not present

## 2023-09-04 DIAGNOSIS — F4310 Post-traumatic stress disorder, unspecified: Secondary | ICD-10-CM | POA: Diagnosis not present

## 2023-09-05 DIAGNOSIS — F4310 Post-traumatic stress disorder, unspecified: Secondary | ICD-10-CM | POA: Diagnosis not present

## 2023-09-05 DIAGNOSIS — F319 Bipolar disorder, unspecified: Secondary | ICD-10-CM | POA: Diagnosis not present

## 2023-09-05 DIAGNOSIS — F17200 Nicotine dependence, unspecified, uncomplicated: Secondary | ICD-10-CM | POA: Diagnosis not present

## 2023-09-05 DIAGNOSIS — F411 Generalized anxiety disorder: Secondary | ICD-10-CM | POA: Diagnosis not present

## 2023-09-10 ENCOUNTER — Encounter: Payer: Self-pay | Admitting: Pulmonary Disease

## 2023-10-14 ENCOUNTER — Ambulatory Visit

## 2023-10-16 DIAGNOSIS — F319 Bipolar disorder, unspecified: Secondary | ICD-10-CM | POA: Diagnosis not present

## 2023-10-16 DIAGNOSIS — F17200 Nicotine dependence, unspecified, uncomplicated: Secondary | ICD-10-CM | POA: Diagnosis not present

## 2023-10-16 DIAGNOSIS — F411 Generalized anxiety disorder: Secondary | ICD-10-CM | POA: Diagnosis not present

## 2023-10-16 DIAGNOSIS — F1011 Alcohol abuse, in remission: Secondary | ICD-10-CM | POA: Diagnosis not present

## 2023-11-13 DIAGNOSIS — F4310 Post-traumatic stress disorder, unspecified: Secondary | ICD-10-CM | POA: Diagnosis not present

## 2023-11-13 DIAGNOSIS — F4481 Dissociative identity disorder: Secondary | ICD-10-CM | POA: Diagnosis not present

## 2023-12-09 DIAGNOSIS — F319 Bipolar disorder, unspecified: Secondary | ICD-10-CM | POA: Diagnosis not present

## 2023-12-09 DIAGNOSIS — F17200 Nicotine dependence, unspecified, uncomplicated: Secondary | ICD-10-CM | POA: Diagnosis not present

## 2023-12-09 DIAGNOSIS — F1011 Alcohol abuse, in remission: Secondary | ICD-10-CM | POA: Diagnosis not present

## 2023-12-09 DIAGNOSIS — F411 Generalized anxiety disorder: Secondary | ICD-10-CM | POA: Diagnosis not present

## 2023-12-11 DIAGNOSIS — F4481 Dissociative identity disorder: Secondary | ICD-10-CM | POA: Diagnosis not present

## 2023-12-11 DIAGNOSIS — F4310 Post-traumatic stress disorder, unspecified: Secondary | ICD-10-CM | POA: Diagnosis not present

## 2024-01-16 DIAGNOSIS — F4481 Dissociative identity disorder: Secondary | ICD-10-CM | POA: Diagnosis not present

## 2024-01-16 DIAGNOSIS — F4310 Post-traumatic stress disorder, unspecified: Secondary | ICD-10-CM | POA: Diagnosis not present

## 2024-02-18 ENCOUNTER — Encounter: Payer: Self-pay | Admitting: *Deleted

## 2024-02-18 NOTE — Progress Notes (Signed)
 Malik Edwards                                          MRN: [id number]   02/18/2024   The [facility] Quality Team Specialist reviewed this patient medical record for the purposes of chart review for care gap closure. The following were reviewed: chart review for care gap closure-controlling blood pressure.    [facility] Quality Team

## 2024-02-27 ENCOUNTER — Other Ambulatory Visit: Payer: Self-pay | Admitting: Internal Medicine

## 2024-02-27 DIAGNOSIS — E785 Hyperlipidemia, unspecified: Secondary | ICD-10-CM
